# Patient Record
Sex: Male | Born: 1940 | Race: White | Hispanic: No | Marital: Married | Smoking: Current every day smoker
Health system: Southern US, Community
[De-identification: ages and names within clinical notes are randomized; demographics above are authoritative.]

## PROBLEM LIST (undated history)

## (undated) DIAGNOSIS — I671 Cerebral aneurysm, nonruptured: Secondary | ICD-10-CM

## (undated) DIAGNOSIS — K219 Gastro-esophageal reflux disease without esophagitis: Secondary | ICD-10-CM

## (undated) DIAGNOSIS — H409 Unspecified glaucoma: Secondary | ICD-10-CM

## (undated) DIAGNOSIS — G062 Extradural and subdural abscess, unspecified: Secondary | ICD-10-CM

## (undated) DIAGNOSIS — L270 Generalized skin eruption due to drugs and medicaments taken internally: Secondary | ICD-10-CM

## (undated) DIAGNOSIS — R0989 Other specified symptoms and signs involving the circulatory and respiratory systems: Secondary | ICD-10-CM

## (undated) DIAGNOSIS — R0600 Dyspnea, unspecified: Secondary | ICD-10-CM

## (undated) DIAGNOSIS — Z87442 Personal history of urinary calculi: Secondary | ICD-10-CM

## (undated) DIAGNOSIS — I639 Cerebral infarction, unspecified: Secondary | ICD-10-CM

## (undated) DIAGNOSIS — Z9889 Other specified postprocedural states: Secondary | ICD-10-CM

## (undated) DIAGNOSIS — M545 Low back pain, unspecified: Secondary | ICD-10-CM

## (undated) DIAGNOSIS — E7800 Pure hypercholesterolemia, unspecified: Secondary | ICD-10-CM

## (undated) DIAGNOSIS — Z9289 Personal history of other medical treatment: Secondary | ICD-10-CM

## (undated) DIAGNOSIS — Z8679 Personal history of other diseases of the circulatory system: Secondary | ICD-10-CM

## (undated) DIAGNOSIS — H269 Unspecified cataract: Secondary | ICD-10-CM

## (undated) DIAGNOSIS — R569 Unspecified convulsions: Secondary | ICD-10-CM

## (undated) DIAGNOSIS — M1990 Unspecified osteoarthritis, unspecified site: Secondary | ICD-10-CM

## (undated) DIAGNOSIS — J189 Pneumonia, unspecified organism: Secondary | ICD-10-CM

## (undated) DIAGNOSIS — J439 Emphysema, unspecified: Secondary | ICD-10-CM

## (undated) DIAGNOSIS — C4490 Unspecified malignant neoplasm of skin, unspecified: Secondary | ICD-10-CM

## (undated) DIAGNOSIS — T7840XA Allergy, unspecified, initial encounter: Secondary | ICD-10-CM

## (undated) DIAGNOSIS — G8929 Other chronic pain: Secondary | ICD-10-CM

## (undated) DIAGNOSIS — I10 Essential (primary) hypertension: Secondary | ICD-10-CM

## (undated) HISTORY — PX: FOOT FRACTURE SURGERY: SHX645

## (undated) HISTORY — DX: Unspecified malignant neoplasm of skin, unspecified: C44.90

## (undated) HISTORY — PX: BRAIN SURGERY: SHX531

## (undated) HISTORY — DX: Emphysema, unspecified: J43.9

## (undated) HISTORY — PX: COLONOSCOPY: SHX174

## (undated) HISTORY — DX: Unspecified cataract: H26.9

## (undated) HISTORY — DX: Allergy, unspecified, initial encounter: T78.40XA

## (undated) HISTORY — PX: EYE SURGERY: SHX253

## (undated) HISTORY — PX: CATARACT EXTRACTION W/ INTRAOCULAR LENS  IMPLANT, BILATERAL: SHX1307

## (undated) HISTORY — DX: Cerebral infarction, unspecified: I63.9

## (undated) HISTORY — DX: Unspecified glaucoma: H40.9

## (undated) HISTORY — PX: FRACTURE SURGERY: SHX138

---

## 2007-09-28 ENCOUNTER — Encounter: Admission: RE | Admit: 2007-09-28 | Discharge: 2007-09-28 | Payer: Self-pay | Admitting: Orthopedic Surgery

## 2007-10-13 ENCOUNTER — Inpatient Hospital Stay (HOSPITAL_COMMUNITY): Admission: RE | Admit: 2007-10-13 | Discharge: 2007-10-15 | Payer: Self-pay | Admitting: Orthopedic Surgery

## 2011-02-05 NOTE — Op Note (Signed)
NAME:  Damon, Parrish NO.:  [id number]   MEDICAL RECORD NO.:  [id number]          PATIENT TYPE:  INP   LOCATION:  2550                         FACILITY:  [facility]   PHYSICIAN:  Doralee Albino. Carola Frost, M.D. DATE OF BIRTH:  1941-05-17   DATE OF PROCEDURE:  10/13/2007  DATE OF DISCHARGE:                               OPERATIVE REPORT   PREOPERATIVE DIAGNOSIS:  Left intra-articular calcaneus fracture.   POSTOPERATIVE DIAGNOSIS:  Left intra-articular calcaneus fracture.   PROCEDURE:  Open reduction and internal fixation of left calcaneus  fracture.   SURGEON:  Doralee Albino. Carola Frost, M.D.   ASSISTANT:  Montez Morita, P.A.-C.   ANESTHESIA:  General.   ANESTHESIOLOGIST:  Quita Skye. Krista Blue, M.D.   TOTAL TOURNIQUET TIME:  125 minutes.   DRAINS:  One.   ESTIMATED BLOOD LOSS:  40 mL.   DISPOSITION:  To PACU.   CONDITION:  Stable.   BRIEF SUMMARY AND INDICATIONS FOR PROCEDURE:  Damon Parrish is a very  pleasant 70 year old male who fell off a ladder, sustaining an intra-  articular left calcaneus fracture.  This was allowed to undergo a period  of surrounding soft tissue swelling resolution until the skin wrinkled  easily.  We also discussed the importance of smoking cessation and high  risk of perioperative wound complications which could lead to infection  in smokers in this demographic.  The patient understood this clearly.  He understood all of the risks and benefits associated surgery including  the possibility of arthritis, decreased range of motion, infection,  neurovascular injury, need for further surgery including amputation,  DVT, PE, heart attack, stroke and others.  After full discussion, he  wished to proceed.   DESCRIPTION OF PROCEDURE:  [name] was taken to the operating room  after administration of preop antibiotics.  General anesthesia was  induced.  His left lower extremity was prepped and draped in the usual  sterile fashion with a tourniquet about the  thigh.  The incision was  marked.  The Esmarch bandage was used to exsanguinate the extremity and  tourniquet inflated to 300 mmHg.  We then made a full-thickness incision  and elevated the flap, being careful with dissection proximally to avoid  injury to the sural nerve and also to the peroneal tendons.  A  meticulous technique was used to avoid pressure and manipulation of the  flap.  It was retracted with K-wires placed into the fibula and talus.  We then removed the lateral wall which had extensive blowout with an  osteotome.  We were able to identify the lateral piece of the  subchondral subtalar facet.  This was derotated, and then we began work  on restoring the largest portion of the substance of the subtalar facet.  This had to be manipulated and then elevated with the laminar spreaders  in order to oppose it properly to the sustentaculum.  We then placed the  lateral aspect of the subtalar joint, subchondral bone and at drove a K-  wire across to hold it provisionally.  We then placed a K-wire into the  tuberosity,  pulling retraction and valgus force to properly orient the  tuberosity with regard to restoration of height, length, and hind foot  valgus.  This was pinned provisionally with two 6.2 K-wire pins through  the tuberosity into the talus.  After copious irrigation, we then  obtained multiple fluoro images showing appropriate reduction.  We  replaced the lateral wall in two segments and then the DePuy titanium  plate.  We placed multiple screws, again, correcting the hind foot  alignment and making sure we were back in appropriate reduction.  We  then also were able to distract the ankle and subtalar joint and feel  with the Glorious Peach and see that there was no intra-articular penetration of  the sustentacular lag screw.  This lag screw had been placed outside the  plate over the near cortex with 3.5 drill.  The wound was copiously  irrigated and closed in standard layered  fashion using 2-0 Vicryl, 3-0  Vicryl, and 3-0 nylon with Allgower-Donati and simple sutures.  A deep  drain was placed prior to closure.  A sterile gently compressive  dressing was applied and then a night splint.   The patient was awakened from anesthesia and transported to the PACU in  stable condition.  The tourniquet was deflated at 2 hours and 5 minutes  which was before  final closure of the epidermal layer with the nylons.   PROGNOSIS:  [name] has had a severe injury to his left calcaneus but  reconstruction was able to eliminate the lateral wall blowout, the hind  foot varus, and restore height and congruity of the subtalar joint.  As  such, he has a good chance at significant improvement in his ambulatory  function.  He is still at elevated risk for wound complications  secondary to his smoking and for subtalar arthritis, given the nature of  his injury.  He will be on DVT prophylaxis with Lovenox for the next 3  weeks, as we do anticipate him to be quite active.  The drain will be  removed on postop day #2.  Anticipate graduated weightbearing beginning  at 8 weeks.      Doralee Albino. Carola Frost, M.D.  Electronically Signed     MHH/MEDQ  D:  10/13/2007  T:  10/13/2007  Job:  [id number]

## 2011-02-08 NOTE — Discharge Summary (Signed)
NAME:  Damon Parrish, Damon Parrish NO.:  [id number]   MEDICAL RECORD NO.:  [id number]          PATIENT TYPE:  INP   LOCATION:  5030                         FACILITY:  [facility]   PHYSICIAN:  Doralee Albino. Carola Frost, M.D. DATE OF BIRTH:  1941/08/03   DATE OF ADMISSION:  10/13/2007  DATE OF DISCHARGE:  10/15/2007                               DISCHARGE SUMMARY   DISCHARGE DIAGNOSIS:  Left intra articular calcaneus fracture.   Additional discharge diagnoses:  1. GERD  2. Hypercholesterolemia.  3. Hypertension.  4. Coronary artery disease.   PROCEDURES PERFORMED:  Open reduction and internal fixation of left  calcaneus fracture.   BRIEF HISTORY AND HOSPITAL COURSE:  Damon Parrish is a 70 year old Caucasian  male who sustained an  injury on September 26, 2007 when he was on a ladder  doing some yard work and subsequently fell off, resulting in an intra  articular fracture of his left calcaneus. Secondary to substantial soft  tissue swelling immediate operative intervention was unable to be  performed, therefore we allowed ample time for resolution of soft tissue  swelling and edema and allowed enough mobility of the skin to return  prior to taking  the patient to the operating room for definitive  fixation of this fracture. On 10/13/07  the patient was brought to the  operating room  for open reduction and internal fixation of his left  calcaneus fracture.  The patient tolerated the procedure well and did  not have any intra operative complications.   The patient's hospital course was relatively uncomplicated. On  postoperative day number one,  the patient was doing extremely well with  fairly well controlled pain and maxed out at approximately 5/10 despite  his block wearing off earlier that morning. He, at no point complained  of any numbness or tingling  in his left lower extremity. At no point  did he experience any nausea, vomiting, chest pain or shortness of  breath or palpitations. On  postoperative day number two,  the patient  continued to do exceptionally well with extremely well controlled pain  on oral medications. He did not have any new complaints at that time. He  was also tolerating solids very well. He was passing gas and voiding  without problem and stated his desire to return home.  The patient's  drain was then pulled and his dressing was changed. His hemoglobin and  hematocrit was stable, along with stable vital signs, therefore the  patient was deemed appropriate for discharge to home. In addition  the  patient was working exceptionally well with physical therapy and making  tremendous progress in such a short period of time.   DISCHARGE PHYSICAL EXAM:  Vital Signs: Temperature 98.4, heart rate 98,  respirations 18, blood pressure 111/68, O2 sat 96% on room air. CBC-  white blood cells 9.4, hemoglobin 11.6, hematocrit 34.4, platelets 291,  sodium 131, potassium 3.8, chloride 95, C02 30, BUN 7, creatinine 0.80,  glucose 111, calcium 9.1.  GENERAL:  The patient is awake, alert and no acute distress, continues  to be extremely pleasant and is  sitting upright in bed.  LUNGS: Decreased throughout but are clear.  CARDIAC: Regular rate and rhythm.  EXTREMITIES: Left lower extremity incision is clean, dry and intact. No  drainage. The drain was intact and was removed. Sensation along the DPN,  SPN, TN is intact to light touch. EHL and FHL is intact. Anterior  tibialis and posterior tibialis is intact. There is no deep calf  tenderness, 2+ dorsalis pedis pulse is noted. Skin is appropriate color  and temperature.   DISCHARGE INSTRUCTIONS:  Damon Parrish will continue to be non weight  bearing on his left lower extremity and we will continue to maintain him  in a night splint to put his foot in a neutral position in hopes to  prevent any development of an equinus contracture and to provide some  stability for wound healing. We anticipate that Damon Parrish may be on  some  graduated weight bearing around the 8 week mark. Damon Parrish does have  extensive smoking history of approximately 1-2 packs over the last 50  years which increases his risk even further for  a development of non  union, malunion and/or wound complication. Again, this has been  reemphasized to Damon Parrish and he acknowledges the potential  complications of continued smoking. Given the lower extremity fracture  and history of smoking, Damon Parrish will be on Lovenox for DVT prophylaxis  and we anticipate this for the next 21 days or so, given the fact we do  anticipate Damon Parrish being quite active, despite being non weight  bearing on his left lower extremity. We will see Damon Parrish back in 10-14  days for a followup visit in the office at which time we anticipate  removal of sutures and we will obtain x-rays to assess hardware  placement and reduction of his fracture. The patient may then at that  time be allowed to come out of his night splint periodically for range  of motion activities.   DISCHARGE MEDICATIONS:  1. Percocet 5/325, one to two p.o. q 4-6h as needed for pain.  2. Oxycodone 5 mg, one to two p.o q 3h in between Percocet doses for      breakthrough pain.  3. Robaxin 500 mg, one to two p.o. q 6h as needed for muscle spasm.   The patient will continue home medications as follows:  1. Hydrochlorothiazide 25 mg, p.o. daily.  2. Lexapro 10 mg p.o. daily.  3. Protonix 40 mg p.o. daily.  4. He may resume aspirin 81 mg p.o. daily.  5. Lipitor 40 mg p.o.daily.      Damon Latin, PA      Doralee Albino. Carola Frost, M.D.  Electronically Signed    KWP/MEDQ  D:  11/11/2007  T:  11/12/2007  Job:  [id number]

## 2011-06-13 LAB — BASIC METABOLIC PANEL
BUN: 13
BUN: 7
BUN: 8
CO2: 29
Calcium: 8.4
Chloride: 95 — ABNORMAL LOW
Creatinine, Ser: 0.97
Creatinine, Ser: 0.97
GFR calc non Af Amer: >60
GFR calc non Af Amer: >60
Glucose, Bld: 116 — ABNORMAL HIGH
Potassium: 3.8
Sodium: 131 — ABNORMAL LOW

## 2011-06-13 LAB — DIFFERENTIAL
Basophils Absolute: 0.1
Basophils Relative: 1
Eosinophils Absolute: 0.3
Eosinophils Relative: 3

## 2011-06-13 LAB — CBC
HCT: 41.9
Hemoglobin: 11.6 — ABNORMAL LOW
Hemoglobin: 12.1 — ABNORMAL LOW
MCHC: 34.3
MCV: 98.9
Platelets: 291
Platelets: 409 — ABNORMAL HIGH
RDW: 12.2
RDW: 12.4
RDW: 12.4

## 2011-06-13 LAB — PROTIME-INR: Prothrombin Time: 15

## 2014-09-23 DIAGNOSIS — Z8679 Personal history of other diseases of the circulatory system: Secondary | ICD-10-CM

## 2014-09-23 HISTORY — DX: Personal history of other diseases of the circulatory system: Z86.79

## 2014-12-21 ENCOUNTER — Emergency Department (HOSPITAL_COMMUNITY)
Admission: EM | Admit: 2014-12-21 | Discharge: 2014-12-21 | Disposition: A | Discharge status: Home / Self Care | Payer: Medicare Other | Attending: Emergency Medicine | Admitting: Emergency Medicine

## 2014-12-21 ENCOUNTER — Encounter (HOSPITAL_COMMUNITY): Payer: Self-pay | Admitting: Emergency Medicine

## 2014-12-21 DIAGNOSIS — Z7951 Long term (current) use of inhaled steroids: Secondary | ICD-10-CM | POA: Diagnosis not present

## 2014-12-21 DIAGNOSIS — K120 Recurrent oral aphthae: Secondary | ICD-10-CM | POA: Diagnosis not present

## 2014-12-21 DIAGNOSIS — I10 Essential (primary) hypertension: Secondary | ICD-10-CM | POA: Diagnosis not present

## 2014-12-21 DIAGNOSIS — Z720 Tobacco use: Secondary | ICD-10-CM | POA: Diagnosis not present

## 2014-12-21 DIAGNOSIS — Z79899 Other long term (current) drug therapy: Secondary | ICD-10-CM | POA: Diagnosis not present

## 2014-12-21 DIAGNOSIS — K1379 Other lesions of oral mucosa: Secondary | ICD-10-CM | POA: Diagnosis present

## 2014-12-21 HISTORY — DX: Essential (primary) hypertension: I10

## 2014-12-21 MED ORDER — MAGIC MOUTHWASH
ORAL | Status: AC
  Filled 2014-12-21: qty 5

## 2014-12-21 MED ORDER — MAGIC MOUTHWASH
5.0000 mL | Freq: Three times a day (TID) | ORAL | Status: DC | PRN
  Administered 2014-12-21: 5 mL via ORAL

## 2014-12-21 MED ORDER — MAGIC MOUTHWASH: 5.0000 mL | Freq: Three times a day (TID) | ORAL | Status: DC | PRN

## 2014-12-21 NOTE — ED Provider Notes (Signed)
CSN: [id number]     Arrival date & time 12/21/14  2006 History   First MD Initiated Contact with Patient 12/21/14 2013     Chief Complaint  Patient presents with  . Mouth Lesions     (Consider location/radiation/quality/duration/timing/severity/associated sxs/prior Treatment) HPI  This is a 74 year old male with a history of hypertension and COPD who presents with mouth lesions. Patient reports that he was recently placed on a steroid inhaler by his primary care physician. He has since followed up with a pulmonologist and was placed on Anoro Ellipta on Thursday of last week. On Saturday he noted that he was developing lesions in his oral cavity. They were painful. He called his primary care physician who prescribed him nystatin. That has not helped at all.  Rates his pain is now is 9 out of 10. Denies any difficulty swallowing. States that he has been hoarse since he had bronchitis at the end of February and that the pulmonologist told him it was related to oral steroid use.  Denies rash elsewhere.  Past Medical History  Diagnosis Date  . Hypertension    Past Surgical History  Procedure Laterality Date  . Foot surgery      Left   No family history on file. History  Substance Use Topics  . Smoking status: Current Every Day Smoker -- 0.50 packs/day    Types: Cigarettes  . Smokeless tobacco: Not on file  . Alcohol Use: No    Review of Systems  Constitutional: Negative.  Negative for fever.  HENT: Positive for mouth sores.   Respiratory: Negative.  Negative for chest tightness and shortness of breath.   Cardiovascular: Negative.  Negative for chest pain.  Gastrointestinal: Negative.  Negative for abdominal pain.  Genitourinary: Negative.  Negative for dysuria.  Skin: Negative for rash.  All other systems reviewed and are negative.     Allergies  Review of patient's allergies indicates no known allergies.  Home Medications   Prior to Admission medications   Medication  Sig Start Date End Date Taking? Authorizing Provider  ANORO ELLIPTA 62.5-25 MCG/INH AEPB Inhale 1 puff into the lungs daily.  12/15/14  Yes Historical Provider, MD  atorvastatin (LIPITOR) 40 MG tablet Take 40 mg by mouth every other day.   Yes Historical Provider, MD  brimonidine-timolol (COMBIGAN) 0.2-0.5 % ophthalmic solution Place 1 drop into both eyes every 12 (twelve) hours.   Yes Historical Provider, MD  cholecalciferol (VITAMIN D) 1000 UNITS tablet Take 1,000 Units by mouth daily.   Yes Historical Provider, MD  fluticasone (FLONASE) 50 MCG/ACT nasal spray Place 2 sprays into both nostrils daily.   Yes Historical Provider, MD  hydrochlorothiazide (HYDRODIURIL) 25 MG tablet Take 25 mg by mouth daily. 10/29/14  Yes Historical Provider, MD  lansoprazole (PREVACID) 15 MG capsule Take 15 mg by mouth daily at 12 noon.   Yes Historical Provider, MD  Multiple Vitamin (MULTIVITAMIN WITH MINERALS) TABS tablet Take 1 tablet by mouth daily.   Yes Historical Provider, MD  TRAVATAN Z 0.004 % SOLN ophthalmic solution Place 1 drop into both eyes at bedtime.  11/26/14  Yes Historical Provider, MD  vitamin C (ASCORBIC ACID) 500 MG tablet Take 500 mg by mouth daily.   Yes Historical Provider, MD  vitamin E 400 UNIT capsule Take 400 Units by mouth daily.   Yes Historical Provider, MD  Alum & Mag Hydroxide-Simeth (MAGIC MOUTHWASH) SOLN Take 5 mLs by mouth 3 (three) times daily as needed for mouth pain (Swish and spit).  12/21/14   Merryl Hacker, MD   BP 146/85 mmHg  Pulse 81  Temp(Src) 98.4 F (36.9 C) (Oral)  Resp 18  Ht 5\' 8"  (1.727 m)  Wt 164 lb (74.39 kg)  BMI 24.94 kg/m2  SpO2 100% Physical Exam  Constitutional: He is oriented to person, place, and time. He appears well-developed and well-nourished. No distress.  HENT:  Head: Normocephalic and atraumatic.  Mucous membranes moist, multiple superficial ulcerations noted just within the oral cavity as well as to ulcerations noted of the hard palate, base  clear, no drainage or bleeding noted, no blistering noted, no trismus, no oral pharyngeal swelling noted  Eyes: Pupils are equal, round, and reactive to light.  Neck: Neck supple.  Cardiovascular: Normal rate and regular rhythm.   No murmur heard. Pulmonary/Chest: Effort normal. No respiratory distress.  Musculoskeletal: He exhibits no edema.  Neurological: He is alert and oriented to person, place, and time.  Skin: Skin is warm and dry.  Psychiatric: He has a normal mood and affect.  Nursing note and vitals reviewed.   ED Course  Procedures (including critical care time) Labs Review Labs Reviewed - No data to display  Imaging Review No results found.   EKG Interpretation None      MDM   Final diagnoses:  Aphthous ulcer    Patient presents with ulcerations in the mouth. Has been treated for fungal infection but was not evaluated by his primary doctor and nystatin is not working. Physical exam is more consistent with aphthous ulcers versus herpangina.  It is unclear if this is related to his recent medication changes. He has started a new medication which I'm unfamiliar with. I cannot find any documentation of this sort of side effect with that medication. Have urged the patient to contact his pulmonologist to advise them of his symptoms. Patient is to stop nystatin. He will be given Magic mouthwash and can swish and spit 3 times daily as needed for pain. No evidence at this time of acute allergic reaction or airway compromise. Patient and his wife were advised of return precautions regarding worsening of his symptoms. Patient and his wife stated understanding.  Merryl Hacker, MD 12/21/14 [id number]

## 2014-12-21 NOTE — Discharge Instructions (Signed)
Oral Ulcers Oral ulcers are painful, shallow sores around the lining of the mouth. They can affect the gums, the inside of the lips, and the cheeks. (Sores on the outside of the lips and on the face are different.) They typically first occur in school-aged children and teenagers. Oral ulcers may also be called canker sores or cold sores. CAUSES  Canker sores and cold sores can be caused by many factors including:  Infection.  Injury.  Sun exposure.  Medications.  Emotional stress.  Food allergies.  Vitamin deficiencies.  Toothpastes containing sodium lauryl sulfate. The herpes virus can be the cause of mouth ulcers. The first infection can be severe and cause 10 or more ulcers on the gums, tongue, and lips with fever and difficulty in swallowing. This infection usually occurs between the ages of 1 and 3 years.  SYMPTOMS  The typical sore is about  inch (6 mm) in size and is an oval or round ulcer with red borders. DIAGNOSIS  Your caregiver can diagnose simple oral ulcers by examination. Additional testing is usually not required.  TREATMENT  Treatment is aimed at pain relief. Generally, oral ulcers resolve by themselves within 1 to 2 weeks without medication and are not contagious unless caused by herpes (and other viruses). Antibiotics are not effective with mouth sores. Avoid direct contact with others until the ulcer is completely healed. See your caregiver for follow-up care as recommended. Also:  Offer a soft diet.  Encourage plenty of fluids to prevent dehydration. Popsicles and milk shakes can be helpful.  Avoid acidic and salty foods and drinks such as orange juice.  Infants and young children will often refuse to drink because of pain. Using a teaspoon, cup, or syringe to give small amounts of fluids frequently can help prevent dehydration.  Cold compresses on the face may help reduce pain.  Pain medication can help control soreness.  A solution of diphenhydramine  mixed with a liquid antacid can be useful to decrease the soreness of ulcers. Consult a caregiver for the dosing.  Liquids or ointments with a numbing ingredient may be helpful when used as recommended.  Older children and teenagers can rinse their mouth with a salt-water mixture (1/2 teaspoon of salt in 8 ounces of water) four times a day. This treatment is uncomfortable but may reduce the time the ulcers are present.  There are many over-the-counter throat lozenges and medications available for oral ulcers. Their effectiveness has not been studied.  Consult your medical caregiver prior to using homeopathic treatments for oral ulcers. SEEK MEDICAL CARE IF:   You think your child needs to be seen.  The pain worsens and you cannot control it.  There are 4 or more ulcers.  The lips and gums begin to bleed and crust.  A single mouth ulcer is near a tooth that is causing a toothache or pain.  Your child has a fever, swollen face, or swollen glands.  The ulcers began after starting a medication.  Mouth ulcers keep reoccurring or last more than 2 weeks.  You think your child is not taking adequate fluids. SEEK IMMEDIATE MEDICAL CARE IF:   Your child has a high fever.  Your child is unable to swallow or becomes dehydrated.  Your child looks or acts very ill.  An ulcer caused by a chemical your child accidentally put in their mouth. Document Released: 10/17/2004 Document Revised: 01/24/2014 Document Reviewed: 06/01/2009 ExitCare Patient Information 2015 [employer]. This information is not intended to replace advice   given to you by your health care provider. Make sure you discuss any questions you have with your health care provider.  

## 2014-12-21 NOTE — ED Notes (Signed)
Pt was treated for bronchitis end of February, started having mouth lesions, saw PCP and got nystatin , feel like lesions are worse.

## 2014-12-22 ENCOUNTER — Telehealth: Payer: Self-pay | Admitting: *Deleted

## 2014-12-22 NOTE — Telephone Encounter (Signed)
Pharmacy states Alum & Mag Hydroxide-Simeth (MAGIC MOUTHWASH) SOLN prescribed is more for pt with GI dx; suggested to change to [facility] MOUTHWASH for mouth pain with lidocaine.

## 2015-02-09 ENCOUNTER — Other Ambulatory Visit (HOSPITAL_COMMUNITY): Payer: Self-pay | Admitting: Neurosurgery

## 2015-02-09 ENCOUNTER — Other Ambulatory Visit: Payer: Self-pay | Admitting: Neurosurgery

## 2015-02-09 DIAGNOSIS — I729 Aneurysm of unspecified site: Secondary | ICD-10-CM

## 2015-03-17 ENCOUNTER — Ambulatory Visit (HOSPITAL_COMMUNITY)
Admission: RE | Admit: 2015-03-17 | Discharge: 2015-03-17 | Disposition: A | Discharge status: Home / Self Care | Payer: Medicare Other | Source: Ambulatory Visit | Attending: Neurosurgery | Admitting: Neurosurgery

## 2015-03-17 ENCOUNTER — Other Ambulatory Visit (HOSPITAL_COMMUNITY): Payer: Self-pay | Admitting: Neurosurgery

## 2015-03-17 DIAGNOSIS — I671 Cerebral aneurysm, nonruptured: Secondary | ICD-10-CM | POA: Diagnosis not present

## 2015-03-17 DIAGNOSIS — J329 Chronic sinusitis, unspecified: Secondary | ICD-10-CM | POA: Insufficient documentation

## 2015-03-17 DIAGNOSIS — F17210 Nicotine dependence, cigarettes, uncomplicated: Secondary | ICD-10-CM | POA: Diagnosis not present

## 2015-03-17 DIAGNOSIS — I10 Essential (primary) hypertension: Secondary | ICD-10-CM | POA: Diagnosis not present

## 2015-03-17 DIAGNOSIS — Z7982 Long term (current) use of aspirin: Secondary | ICD-10-CM | POA: Insufficient documentation

## 2015-03-17 DIAGNOSIS — I729 Aneurysm of unspecified site: Secondary | ICD-10-CM

## 2015-03-17 LAB — BASIC METABOLIC PANEL
Anion gap: 12 (ref 5–15)
BUN: 10 mg/dL (ref 6–20)
CHLORIDE: 96 mmol/L — AB (ref 101–111)
CO2: 23 mmol/L (ref 22–32)
CREATININE: 0.91 mg/dL (ref 0.61–1.24)
Calcium: 9.4 mg/dL (ref 8.9–10.3)
GFR calc non Af Amer: >60 mL/min (ref >60)
Glucose, Bld: 116 mg/dL — ABNORMAL HIGH (ref 65–99)
POTASSIUM: 3.2 mmol/L — AB (ref 3.5–5.1)
Sodium: 131 mmol/L — ABNORMAL LOW (ref 135–145)

## 2015-03-17 LAB — APTT: aPTT: 37 seconds (ref 24–37)

## 2015-03-17 LAB — CBC WITH DIFFERENTIAL/PLATELET
Basophils Absolute: 0 10*3/uL (ref 0.0–0.1)
Basophils Relative: 1 % (ref 0–1)
EOS ABS: 0.3 10*3/uL (ref 0.0–0.7)
EOS PCT: 3 % (ref 0–5)
HCT: 42 % (ref 39.0–52.0)
HEMOGLOBIN: 14.6 g/dL (ref 13.0–17.0)
LYMPHS ABS: 2.8 10*3/uL (ref 0.7–4.0)
LYMPHS PCT: 37 % (ref 12–46)
MCH: 33.3 pg (ref 26.0–34.0)
MCHC: 34.8 g/dL (ref 30.0–36.0)
MCV: 95.7 fL (ref 78.0–100.0)
Monocytes Absolute: 0.8 10*3/uL (ref 0.1–1.0)
Monocytes Relative: 11 % (ref 3–12)
Neutro Abs: 3.6 10*3/uL (ref 1.7–7.7)
Neutrophils Relative %: 48 % (ref 43–77)
Platelets: 249 10*3/uL (ref 150–400)
RBC: 4.39 MIL/uL (ref 4.22–5.81)
RDW: 12.7 % (ref 11.5–15.5)
WBC: 7.5 10*3/uL (ref 4.0–10.5)

## 2015-03-17 LAB — PROTIME-INR
INR: 1.41 (ref 0.00–1.49)
Prothrombin Time: 17.3 seconds — ABNORMAL HIGH (ref 11.6–15.2)

## 2015-03-17 MED ORDER — MIDAZOLAM HCL 2 MG/2ML IJ SOLN
INTRAMUSCULAR | Status: AC
  Filled 2015-03-17: qty 2

## 2015-03-17 MED ORDER — SODIUM CHLORIDE 0.9 % IV SOLN
INTRAVENOUS | Status: AC | PRN
  Administered 2015-03-17: 10 mL/h via INTRAVENOUS

## 2015-03-17 MED ORDER — HEPARIN SODIUM (PORCINE) 1000 UNIT/ML IJ SOLN
INTRAMUSCULAR | Status: AC | PRN
  Administered 2015-03-17: 2000 [IU] via INTRAVENOUS

## 2015-03-17 MED ORDER — FENTANYL CITRATE (PF) 100 MCG/2ML IJ SOLN
INTRAMUSCULAR | Status: AC
  Filled 2015-03-17: qty 2

## 2015-03-17 MED ORDER — MIDAZOLAM HCL 2 MG/2ML IJ SOLN
INTRAMUSCULAR | Status: AC | PRN
  Administered 2015-03-17: 0.5 mg via INTRAVENOUS

## 2015-03-17 MED ORDER — HYDROCODONE-ACETAMINOPHEN 5-325 MG PO TABS: 1.0000 | ORAL_TABLET | ORAL | Status: DC | PRN

## 2015-03-17 MED ORDER — IOHEXOL 300 MG/ML  SOLN
250.0000 mL | Freq: Once | INTRAMUSCULAR | Status: AC | PRN
  Administered 2015-03-17: 100 mL via INTRA_ARTERIAL

## 2015-03-17 MED ORDER — LIDOCAINE HCL 1 % IJ SOLN
INTRAMUSCULAR | Status: AC
  Filled 2015-03-17: qty 20

## 2015-03-17 MED ORDER — FENTANYL CITRATE (PF) 100 MCG/2ML IJ SOLN
INTRAMUSCULAR | Status: AC | PRN
  Administered 2015-03-17: 25 ug via INTRAVENOUS

## 2015-03-17 MED ORDER — SODIUM CHLORIDE 0.9 % IV SOLN: INTRAVENOUS | Status: DC

## 2015-03-17 MED ORDER — HEPARIN SOD (PORK) LOCK FLUSH 100 UNIT/ML IV SOLN
INTRAVENOUS | Status: AC
  Filled 2015-03-17: qty 20

## 2015-03-17 NOTE — Discharge Instructions (Signed)
Angiogram °An angiogram, also called angiography, is a procedure used to look at the blood vessels that carry blood to different parts of your body (arteries). In this procedure, dye is injected through a long, thin tube (catheter) into an artery. X-rays are then taken. The X-rays will show if there is a blockage or problem in a blood vessel.  °LET YOUR HEALTH CARE PROVIDER KNOW ABOUT: °· Any allergies you have, including allergies to shellfish or contrast dye.   °· All medicines you are taking, including vitamins, herbs, eye drops, creams, and over-the-counter medicines.   °· Previous problems you or members of your family have had with the use of anesthetics.   °· Any blood disorders you have.   °· Previous surgeries you have had. °· Any previous kidney problems or failure you have had.  °· Medical conditions you have.   °· Possibility of pregnancy, if this applies. °RISKS AND COMPLICATIONS °Generally, an angiogram is a safe procedure. However, as with any procedure, problems can occur. Possible problems include: °· Injury to the blood vessels, including rupture or bleeding. °· Infection or bruising at the catheter site. °· Allergic reaction to the dye or contrast used. °· Kidney damage from the dye or contrast used. °· Blood clots that can lead to a stroke or heart attack. °BEFORE THE PROCEDURE °· Do not eat or drink after midnight on the night before the procedure, or as directed by your health care provider.   °· Ask your health care provider if you may drink enough water to take any needed medicines the morning of the procedure.   °PROCEDURE °· You may be given a medicine to help you relax (sedative) before and during the procedure. This medicine is given through an IV access tube that is inserted into one of your veins.   °· The area where the catheter will be inserted will be washed and shaved. This is usually done in the groin but may be done in the fold of your arm (near your elbow) or in the wrist. °· A  medicine will be given to numb the area where the catheter will be inserted (local anesthetic). °· The catheter will be inserted with a guide wire into an artery. The catheter is guided by using a type of X-ray (fluoroscopy) to the blood vessel being examined.   °· Dye is then injected into the catheter, and X-rays are taken. The dye helps to show where any narrowing or blockages are located.   °AFTER THE PROCEDURE  °· If the procedure is done through the leg, you will be kept in bed lying flat for several hours. You will be instructed to not bend or cross your legs. °· The insertion site will be checked frequently. °· The pulse in your feet or wrist will be checked frequently. °· Additional blood tests, X-rays, and electrocardiography may be done.   °· You may need to stay in the hospital overnight for observation.   °Document Released: 06/19/2005 Document Revised: 09/14/2013 Document Reviewed: 02/10/2013 °ExitCare® Patient Information ©2015 [employer]. This information is not intended to replace advice given to you by your health care provider. Make sure you discuss any questions you have with your health care provider. ° °

## 2015-03-17 NOTE — Op Note (Signed)
DIAGNOSTIC CEREBRAL ANGIOGRAM    OPERATOR:   Dr. Consuella Lose, MD  HISTORY:   The patient is a 74 y.o. yo male with previous incidentally discovered RICA aneurysm. He presents today for further workup with diagnostic cerebral angiogram.  APPROACH:   The technical aspects of the procedure as well as its potential risks and benefits were reviewed with the patient. These risks included but were not limited bleeding, infection, allergic reaction, damage to organs/vital structures, stroke, non-diagnostic procedure, and the catastrophic outcomes of heart attack, coma, and death. With an understanding of these risks, informed consent was obtained and witnessed.    The patient was placed in the supine position on the angiography table and the skin of right groin prepped in the usual sterile fashion. The procedure was performed under local anesthesia (1%-solution of bicarbonate-bufferred Lidoacaine) and conscious sedation with Versed and fentanyl monitored by the in-suite nurse.    A 5- French sheath was introduced in the right common femoral artery using Seldinger technique.  A fluorophase sequence was used to document the sheath position.    HEPARIN: 2000 Units total.   CONTRAST AGENT: 130cc, Omnipaque 300   FLUOROSCOPY TIME: 13.8 combined AP and lateral minutes    CATHETER(S) AND WIRE(S):    5-French JB-1 glidecatheter  5-French Cook vert catheter 0.035 Roadrunner  0.035" glidewire    VESSELS CATHETERIZED:   Right common carotid   Right internal carotid Left common carotid   Right vertebral   Right common femoral  VESSELS STUDIED:   Aortic arch Right common carotid, neck Right internal carotid, head Right internal carotid, 3D rotation Right vertebral Left common carotid, neck Left common carotid, head Right femoral  PROCEDURAL NARRATIVE:   A 5-Fr JB-1 terumo glide catheter was advanced over a 0.035 glidewire into the aortic arch. The above vessels were then sequentially  catheterized and cervical/cerebral angiograms taken. After review of images, the catheter was removed without incident.    INTERPRETATION:   Aortic arch:    Type III, normal three vessel arch configuration. No significant ostial stenosis.   Right common carotid: neck:   There is no significant stenosis, occlusion, aneurysm or plaque visualized on this injection.    Right internal carotid: head:   Injection reveals the presence of a widely patent ICA, M1, and A1 segments and their branches. 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  This appears to measure approximately 6.8 x 5.35mm. Aneurysm morphology and relationship to the ICA is better defined on the 3D angiogram. The parenchymal and venous phases are normal. The venous sinuses are widely patent.    Right internal carotid, 3-D rotation: 3-dimensional rotational angiographic images were reconstructed on an independent workstation for review. 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 It appears to have a relatively narrow neck, however the anterior choroidal artery appears to arise from the proximal portion of the aneurysm.  Left common carotid: neck:   There is no significant stenosis, occlusion, aneurysm or plaque visualized on this injection.    Left common carotid: head:   Injection reveals the presence of a widely patent ICA, A1, and M1 segments and their branches. There is no significant stenosis, occlusion, aneurysm, or high flow vascular malformation visualized. The parenchymal and venous phases are normal. The venous sinuses are widely patent.    Right vertebral:   Injection reveals the presence of a widely patent vertebral  artery. This leads to a widely patent basilar artery that terminates in bilateral P1. The basilar apex is normal. There is good  contrast reflux down the contralateral left vertebral artery, with visualization of the left PICA origin. No aneurysm is seen in this location. There is no significant stenosis, occlusion, aneurysm, or vascular malformation visualized. The parenchymal and venous phases are normal. The venous sinuses are widely patent.    Right femoral:    Normal vessel. No significant atherosclerotic disease. Arterial sheath in adequate position.   DISPOSITION:  Upon completion of the study, the femoral sheath was removed and hemostasis obtained using a 5-Fr ExoSeal closure device. Good proximal and distal lower extremity pulses were documented upon achievement of hemostasis.    The procedure was well tolerated and no early complications were observed.       The patient was transferred back to the holding area to be positioned flat in bed for 3 hours of observation.    IMPRESSION:  1. 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

## 2015-03-17 NOTE — H&P (Signed)
CC:  Aneurysm  HPI: Damon Parrish is a 74 y.o. male who initially underwent CT scan for chronic sinusitis. 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 This aneurysm was initially seen on MRI done in 2012. He presents today for further w/u with diagnostic angiogram.  PMH: Past Medical History  Diagnosis Date  . Hypertension     PSH: Past Surgical History  Procedure Laterality Date  . Foot surgery      Left    SH: History  Substance Use Topics  . Smoking status: Current Every Day Smoker -- 0.50 packs/day    Types: Cigarettes  . Smokeless tobacco: Not on file  . Alcohol Use: No    MEDS: Prior to Admission medications   Medication Sig Start Date End Date Taking? Authorizing Provider  Ascorbic Acid (VITAMIN C) 1000 MG tablet Take 1,000 mg by mouth daily.   Yes Historical Provider, MD  aspirin EC 81 MG tablet Take 81 mg by mouth at bedtime.   Yes Historical Provider, MD  atorvastatin (LIPITOR) 40 MG tablet Take 40 mg by mouth every other day. At bedtime   Yes Historical Provider, MD  brimonidine-timolol (COMBIGAN) 0.2-0.5 % ophthalmic solution Place 1 drop into both eyes at bedtime. Between 10pm and 11pm   Yes Historical Provider, MD  Brinzolamide-Brimonidine [facility]) 1-0.2 % SUSP Place 1 drop into both eyes 2 (two) times daily. Morning and 8pm   Yes Historical Provider, MD  cholecalciferol (VITAMIN D) 1000 UNITS tablet Take 1,000 Units by mouth daily.   Yes Historical Provider, MD  hydrochlorothiazide (HYDRODIURIL) 25 MG tablet Take 25 mg by mouth daily. 10/29/14  Yes Historical Provider, MD  lansoprazole (PREVACID) 15 MG capsule Take 15 mg by mouth daily.    Yes Historical Provider, MD  Multiple Vitamin (MULTIVITAMIN WITH MINERALS) TABS tablet Take 1 tablet by mouth daily. Centrum Silver   Yes Historical Provider, MD  Omega-3 Fatty Acids (FISH OIL) 1200 MG CAPS Take 1,200 mg by mouth daily.   Yes Historical Provider, MD  vitamin E 400 UNIT capsule Take 400 Units by  mouth daily.   Yes Historical Provider, MD    ALLERGY: No Known Allergies  ROS: ROS  NEUROLOGIC EXAM: Awake, alert, oriented Memory and concentration grossly intact Speech fluent, appropriate CN grossly intact Motor exam: Upper Extremities Deltoid Bicep Tricep Grip  Right 5/5 5/5 5/5 5/5  Left 5/5 5/5 5/5 5/5   Lower Extremity IP Quad PF DF EHL  Right 5/5 5/5 5/5 5/5 5/5  Left 5/5 5/5 5/5 5/5 5/5   Sensation grossly intact to LT  IMPRESSION: - 74 y.o. male with enlargement of RICA aneurysm, current smoker.  PLAN: - Proceed with diagnostic angiogram. - Likely home post-procedure  I have reviewed the indication for the procedure, its risks and benefits with the patient in the office. All questions were answered, and he provided consent to proceed.

## 2015-03-30 ENCOUNTER — Other Ambulatory Visit: Payer: Self-pay | Admitting: Neurosurgery

## 2015-03-30 ENCOUNTER — Other Ambulatory Visit (HOSPITAL_COMMUNITY): Payer: Self-pay | Admitting: Neurosurgery

## 2015-03-30 DIAGNOSIS — I671 Cerebral aneurysm, nonruptured: Secondary | ICD-10-CM

## 2015-04-20 ENCOUNTER — Other Ambulatory Visit: Payer: Self-pay | Admitting: Neurosurgery

## 2015-04-20 ENCOUNTER — Ambulatory Visit (HOSPITAL_COMMUNITY)
Admission: RE | Admit: 2015-04-20 | Discharge: 2015-04-20 | Disposition: A | Discharge status: Home / Self Care | Payer: Medicare Other | Source: Ambulatory Visit | Attending: Neurosurgery | Admitting: Neurosurgery

## 2015-04-20 DIAGNOSIS — F17210 Nicotine dependence, cigarettes, uncomplicated: Secondary | ICD-10-CM | POA: Insufficient documentation

## 2015-04-20 DIAGNOSIS — I10 Essential (primary) hypertension: Secondary | ICD-10-CM | POA: Insufficient documentation

## 2015-04-20 DIAGNOSIS — K219 Gastro-esophageal reflux disease without esophagitis: Secondary | ICD-10-CM | POA: Insufficient documentation

## 2015-04-20 DIAGNOSIS — I739 Peripheral vascular disease, unspecified: Secondary | ICD-10-CM | POA: Insufficient documentation

## 2015-04-20 DIAGNOSIS — Z79899 Other long term (current) drug therapy: Secondary | ICD-10-CM | POA: Insufficient documentation

## 2015-04-20 DIAGNOSIS — I671 Cerebral aneurysm, nonruptured: Secondary | ICD-10-CM

## 2015-05-03 ENCOUNTER — Encounter (HOSPITAL_COMMUNITY): Payer: Self-pay | Admitting: *Deleted

## 2015-05-03 MED ORDER — CEFAZOLIN SODIUM-DEXTROSE 2-3 GM-% IV SOLR
2.0000 g | INTRAVENOUS | Status: AC
  Administered 2015-05-04: 2 g via INTRAVENOUS
  Filled 2015-05-03 (×2): qty 50

## 2015-05-03 NOTE — Progress Notes (Signed)
Pt denies SOB, chest pain, and being under the care of a cardiologist. Pt stated that he had a stress test 20 years ago but denies having an echo and cardiac cath. Pt stated that his PCP completed an EKG within the last year; records requested from  Dr. Morrison Old of [facility] in [place], [place]. Spoke with Manuela Schwartz, CMA at Dr. Cleotilde Neer regarding pt ASA and Plavix instructions on DOS. According to Manuela Schwartz, pt is to take both Aspirin and Plavix the morning of procedure. Pt made aware to stop vitamins, Herbal medications ( vitamin E) and NSAID's. Pt verbalized understanding of all pre-op instructions.

## 2015-05-04 ENCOUNTER — Inpatient Hospital Stay (HOSPITAL_COMMUNITY): Payer: Medicare Other | Admitting: Certified Registered"

## 2015-05-04 ENCOUNTER — Encounter (HOSPITAL_COMMUNITY)
Admission: RE | Disposition: A | Discharge status: Home / Self Care | Payer: Self-pay | Source: Ambulatory Visit | Attending: Neurosurgery

## 2015-05-04 ENCOUNTER — Encounter (HOSPITAL_COMMUNITY): Payer: Self-pay | Admitting: Anesthesiology

## 2015-05-04 ENCOUNTER — Ambulatory Visit (HOSPITAL_COMMUNITY)
Admission: RE | Admit: 2015-05-04 | Discharge: 2015-05-04 | Disposition: A | Discharge status: Home / Self Care | Payer: Medicare Other | Source: Ambulatory Visit | Attending: Neurosurgery | Admitting: Neurosurgery

## 2015-05-04 ENCOUNTER — Inpatient Hospital Stay (HOSPITAL_COMMUNITY)
Admission: RE | Admit: 2015-05-04 | Discharge: 2015-05-05 | DRG: 027 | Disposition: A | Discharge status: Home / Self Care | Payer: Medicare Other | Source: Ambulatory Visit | Attending: Neurosurgery | Admitting: Neurosurgery

## 2015-05-04 DIAGNOSIS — K219 Gastro-esophageal reflux disease without esophagitis: Secondary | ICD-10-CM | POA: Diagnosis present

## 2015-05-04 DIAGNOSIS — I671 Cerebral aneurysm, nonruptured: Principal | ICD-10-CM | POA: Diagnosis present

## 2015-05-04 DIAGNOSIS — F17210 Nicotine dependence, cigarettes, uncomplicated: Secondary | ICD-10-CM | POA: Diagnosis present

## 2015-05-04 DIAGNOSIS — I10 Essential (primary) hypertension: Secondary | ICD-10-CM | POA: Diagnosis present

## 2015-05-04 DIAGNOSIS — Z87442 Personal history of urinary calculi: Secondary | ICD-10-CM | POA: Diagnosis not present

## 2015-05-04 DIAGNOSIS — Z7902 Long term (current) use of antithrombotics/antiplatelets: Secondary | ICD-10-CM | POA: Diagnosis not present

## 2015-05-04 DIAGNOSIS — Z79899 Other long term (current) drug therapy: Secondary | ICD-10-CM | POA: Diagnosis not present

## 2015-05-04 DIAGNOSIS — Z7982 Long term (current) use of aspirin: Secondary | ICD-10-CM

## 2015-05-04 HISTORY — PX: RADIOLOGY WITH ANESTHESIA: SHX6223

## 2015-05-04 HISTORY — DX: Gastro-esophageal reflux disease without esophagitis: K21.9

## 2015-05-04 HISTORY — DX: Cerebral aneurysm, nonruptured: I67.1

## 2015-05-04 LAB — URINE MICROSCOPIC-ADD ON

## 2015-05-04 LAB — URINALYSIS, ROUTINE W REFLEX MICROSCOPIC
Bilirubin Urine: NEGATIVE
GLUCOSE, UA: NEGATIVE mg/dL
HGB URINE DIPSTICK: NEGATIVE
Ketones, ur: NEGATIVE mg/dL
Nitrite: NEGATIVE
Protein, ur: NEGATIVE mg/dL
SPECIFIC GRAVITY, URINE: 1.018 (ref 1.005–1.030)
UROBILINOGEN UA: 0.2 mg/dL (ref 0.0–1.0)
pH: 6.5 (ref 5.0–8.0)

## 2015-05-04 LAB — BASIC METABOLIC PANEL
ANION GAP: 12 (ref 5–15)
BUN: 14 mg/dL (ref 6–20)
CO2: 22 mmol/L (ref 22–32)
CREATININE: 0.97 mg/dL (ref 0.61–1.24)
Calcium: 9.4 mg/dL (ref 8.9–10.3)
Chloride: 99 mmol/L — ABNORMAL LOW (ref 101–111)
GFR calc non Af Amer: >60 mL/min (ref >60)
Glucose, Bld: 103 mg/dL — ABNORMAL HIGH (ref 65–99)
Potassium: 3.4 mmol/L — ABNORMAL LOW (ref 3.5–5.1)
Sodium: 133 mmol/L — ABNORMAL LOW (ref 135–145)

## 2015-05-04 LAB — APTT: aPTT: 36 seconds (ref 24–37)

## 2015-05-04 LAB — CBC
HCT: 43.4 % (ref 39.0–52.0)
Hemoglobin: 15.3 g/dL (ref 13.0–17.0)
MCH: 33.9 pg (ref 26.0–34.0)
MCHC: 35.3 g/dL (ref 30.0–36.0)
MCV: 96.2 fL (ref 78.0–100.0)
PLATELETS: 246 10*3/uL (ref 150–400)
RBC: 4.51 MIL/uL (ref 4.22–5.81)
RDW: 12.6 % (ref 11.5–15.5)
WBC: 6.3 10*3/uL (ref 4.0–10.5)

## 2015-05-04 LAB — PROTIME-INR
INR: 1.22 (ref 0.00–1.49)
Prothrombin Time: 15.6 seconds — ABNORMAL HIGH (ref 11.6–15.2)

## 2015-05-04 SURGERY — RADIOLOGY WITH ANESTHESIA
Anesthesia: General

## 2015-05-04 MED ORDER — PHENYLEPHRINE HCL 10 MG/ML IJ SOLN
10.0000 mg | INTRAVENOUS | Status: DC | PRN
  Administered 2015-05-04: 20 ug/min via INTRAVENOUS

## 2015-05-04 MED ORDER — ROCURONIUM BROMIDE 100 MG/10ML IV SOLN: INTRAVENOUS | Status: DC | PRN

## 2015-05-04 MED ORDER — SODIUM CHLORIDE 0.9 % IV SOLN
INTRAVENOUS | Status: DC | PRN
  Administered 2015-05-04 (×2): via INTRAVENOUS

## 2015-05-04 MED ORDER — LABETALOL HCL 5 MG/ML IV SOLN
INTRAVENOUS | Status: AC
  Filled 2015-05-04: qty 4

## 2015-05-04 MED ORDER — ESMOLOL HCL 10 MG/ML IV SOLN
INTRAVENOUS | Status: DC | PRN
  Administered 2015-05-04: 30 mg via INTRAVENOUS
  Administered 2015-05-04: 20 mg via INTRAVENOUS

## 2015-05-04 MED ORDER — VITAMIN D3 25 MCG (1000 UNIT) PO TABS
1000.0000 [IU] | ORAL_TABLET | Freq: Every day | ORAL | Status: DC
  Filled 2015-05-04: qty 1

## 2015-05-04 MED ORDER — IOHEXOL 300 MG/ML  SOLN
150.0000 mL | Freq: Once | INTRAMUSCULAR | Status: DC | PRN
  Administered 2015-05-04: 60 mL via INTRAVENOUS
  Filled 2015-05-04: qty 150

## 2015-05-04 MED ORDER — ADULT MULTIVITAMIN W/MINERALS CH
1.0000 | ORAL_TABLET | Freq: Every day | ORAL | Status: DC
  Filled 2015-05-04: qty 1

## 2015-05-04 MED ORDER — LACTATED RINGERS IV SOLN
INTRAVENOUS | Status: DC
  Administered 2015-05-04: 11:00:00 via INTRAVENOUS

## 2015-05-04 MED ORDER — ROCURONIUM BROMIDE 100 MG/10ML IV SOLN
INTRAVENOUS | Status: DC | PRN
  Administered 2015-05-04: 40 mg via INTRAVENOUS
  Administered 2015-05-04: 10 mg via INTRAVENOUS

## 2015-05-04 MED ORDER — PANTOPRAZOLE SODIUM 40 MG PO TBEC
40.0000 mg | DELAYED_RELEASE_TABLET | Freq: Every day | ORAL | Status: DC
  Administered 2015-05-04: 40 mg via ORAL
  Filled 2015-05-04: qty 1

## 2015-05-04 MED ORDER — ONDANSETRON HCL 4 MG/2ML IJ SOLN
INTRAMUSCULAR | Status: DC | PRN
Start: 2015-05-04 — End: 2015-05-04
  Administered 2015-05-04: 4 mg via INTRAVENOUS

## 2015-05-04 MED ORDER — GLYCOPYRROLATE 0.2 MG/ML IJ SOLN
INTRAMUSCULAR | Status: DC | PRN
  Administered 2015-05-04: 0.4 mg via INTRAVENOUS

## 2015-05-04 MED ORDER — HYDROCHLOROTHIAZIDE 25 MG PO TABS
25.0000 mg | ORAL_TABLET | Freq: Every day | ORAL | Status: DC
  Filled 2015-05-04: qty 1

## 2015-05-04 MED ORDER — SODIUM CHLORIDE 0.9 % IV SOLN
INTRAVENOUS | Status: DC | PRN
  Administered 2015-05-04: 14:00:00 via INTRAVENOUS

## 2015-05-04 MED ORDER — MORPHINE SULFATE 2 MG/ML IJ SOLN: 1.0000 mg | INTRAMUSCULAR | Status: DC | PRN

## 2015-05-04 MED ORDER — NEOSTIGMINE METHYLSULFATE 10 MG/10ML IV SOLN
INTRAVENOUS | Status: DC | PRN
  Administered 2015-05-04: 3 mg via INTRAVENOUS

## 2015-05-04 MED ORDER — VITAMIN C 500 MG PO TABS
1000.0000 mg | ORAL_TABLET | Freq: Every day | ORAL | Status: DC
  Filled 2015-05-04: qty 2

## 2015-05-04 MED ORDER — PROPOFOL 10 MG/ML IV BOLUS
INTRAVENOUS | Status: DC | PRN
  Administered 2015-05-04: 100 mg via INTRAVENOUS

## 2015-05-04 MED ORDER — HYDROCODONE-ACETAMINOPHEN 5-325 MG PO TABS: 1.0000 | ORAL_TABLET | ORAL | Status: DC | PRN

## 2015-05-04 MED ORDER — CLOPIDOGREL BISULFATE 75 MG PO TABS
75.0000 mg | ORAL_TABLET | Freq: Every day | ORAL | Status: DC
  Administered 2015-05-05: 75 mg via ORAL
  Filled 2015-05-04: qty 1

## 2015-05-04 MED ORDER — ONDANSETRON HCL 4 MG PO TABS: 4.0000 mg | ORAL_TABLET | ORAL | Status: DC | PRN

## 2015-05-04 MED ORDER — ATORVASTATIN CALCIUM 40 MG PO TABS
40.0000 mg | ORAL_TABLET | ORAL | Status: DC
  Filled 2015-05-04: qty 1

## 2015-05-04 MED ORDER — VITAMIN E 180 MG (400 UNIT) PO CAPS
400.0000 [IU] | ORAL_CAPSULE | Freq: Every day | ORAL | Status: DC
  Filled 2015-05-04: qty 1

## 2015-05-04 MED ORDER — HEPARIN SODIUM (PORCINE) 5000 UNIT/ML IJ SOLN
5000.0000 [IU] | Freq: Three times a day (TID) | INTRAMUSCULAR | Status: DC
  Administered 2015-05-05: 5000 [IU] via SUBCUTANEOUS
  Filled 2015-05-04 (×4): qty 1

## 2015-05-04 MED ORDER — ONDANSETRON HCL 4 MG/2ML IJ SOLN: 4.0000 mg | Freq: Once | INTRAMUSCULAR | Status: DC | PRN

## 2015-05-04 MED ORDER — ONDANSETRON HCL 4 MG/2ML IJ SOLN
4.0000 mg | INTRAMUSCULAR | Status: DC | PRN
Start: 2015-05-04 — End: 2015-05-05

## 2015-05-04 MED ORDER — LIDOCAINE HCL (CARDIAC) 20 MG/ML IV SOLN
INTRAVENOUS | Status: DC | PRN
  Administered 2015-05-04: 100 mg via INTRAVENOUS

## 2015-05-04 MED ORDER — BRIMONIDINE TARTRATE-TIMOLOL 0.2-0.5 % OP SOLN: 1.0000 [drp] | Freq: Every day | OPHTHALMIC | Status: DC

## 2015-05-04 MED ORDER — FENTANYL CITRATE (PF) 100 MCG/2ML IJ SOLN: 25.0000 ug | INTRAMUSCULAR | Status: DC | PRN

## 2015-05-04 MED ORDER — SODIUM CHLORIDE 0.9 % IV SOLN: INTRAVENOUS | Status: DC

## 2015-05-04 MED ORDER — TIMOLOL MALEATE 0.5 % OP SOLN
1.0000 [drp] | Freq: Every day | OPHTHALMIC | Status: DC
  Administered 2015-05-04: 1 [drp] via OPHTHALMIC
  Filled 2015-05-04: qty 5

## 2015-05-04 MED ORDER — LABETALOL HCL 5 MG/ML IV SOLN
10.0000 mg | INTRAVENOUS | Status: DC | PRN
  Administered 2015-05-04: 10 mg via INTRAVENOUS

## 2015-05-04 MED ORDER — HEPARIN SODIUM (PORCINE) 1000 UNIT/ML IJ SOLN
INTRAMUSCULAR | Status: DC | PRN
  Administered 2015-05-04: 1000 [IU] via INTRAVENOUS
  Administered 2015-05-04: 5000 [IU] via INTRAVENOUS

## 2015-05-04 MED ORDER — FENTANYL CITRATE (PF) 100 MCG/2ML IJ SOLN
INTRAMUSCULAR | Status: DC | PRN
Start: 2015-05-04 — End: 2015-05-04
  Administered 2015-05-04: 100 ug via INTRAVENOUS
  Administered 2015-05-04 (×3): 50 ug via INTRAVENOUS

## 2015-05-04 MED ORDER — BRIMONIDINE TARTRATE 0.2 % OP SOLN
1.0000 [drp] | Freq: Every day | OPHTHALMIC | Status: DC
  Administered 2015-05-04: 1 [drp] via OPHTHALMIC
  Filled 2015-05-04: qty 5

## 2015-05-04 MED ORDER — ASPIRIN 325 MG PO TABS
325.0000 mg | ORAL_TABLET | Freq: Every day | ORAL | Status: DC
Start: 2015-05-05 — End: 2015-05-05
  Administered 2015-05-05: 325 mg via ORAL
  Filled 2015-05-04: qty 1

## 2015-05-04 NOTE — Anesthesia Postprocedure Evaluation (Signed)
  Anesthesia Post-op Note  Patient: Damon Parrish  Procedure(s) Performed: Procedure(s): Pipeline Embolization (N/A)  Patient Location: PACU  Anesthesia Type:General  Level of Consciousness: awake and alert   Airway and Oxygen Therapy: Patient Spontanous Breathing  Post-op Pain: none  Post-op Assessment: Post-op Vital signs reviewed LLE Motor Response: Purposeful movement, Responds to commands LLE Sensation: Full sensation RLE Motor Response: Purposeful movement, Responds to commands RLE Sensation: Full sensation      Post-op Vital Signs: Reviewed  Last Vitals:  Filed Vitals:   05/04/15 1900  BP: 132/66  Pulse: 69  Temp:   Resp: 13    Complications: No apparent anesthesia complications

## 2015-05-04 NOTE — Anesthesia Procedure Notes (Signed)
Procedure Name: Intubation Date/Time: 05/04/2015 2:45 PM Performed by: Maude Leriche D Pre-anesthesia Checklist: Patient identified, Emergency Drugs available, Suction available, Patient being monitored and Timeout performed Patient Re-evaluated:Patient Re-evaluated prior to inductionOxygen Delivery Method: Circle system utilized Preoxygenation: Pre-oxygenation with 100% oxygen Intubation Type: IV induction Ventilation: Mask ventilation without difficulty Laryngoscope Size: Miller and 2 Grade View: Grade I Tube type: Subglottic suction tube Tube size: 8.0 mm Number of attempts: 1 Airway Equipment and Method: Stylet Placement Confirmation: ETT inserted through vocal cords under direct vision,  positive ETCO2 and breath sounds checked- equal and bilateral Secured at: 23 cm Tube secured with: Tape Dental Injury: Teeth and Oropharynx as per pre-operative assessment

## 2015-05-04 NOTE — Anesthesia Preprocedure Evaluation (Addendum)
Anesthesia Evaluation  Patient identified by MRN, date of birth, ID band Patient awake    Reviewed: Allergy & Precautions, NPO status , Patient's Chart, lab work & pertinent test results  Airway Mallampati: II  TM Distance: >3 FB Neck ROM: Full    Dental  (+) Implants, Dental Advisory Given,    Pulmonary Current Smoker,  1/2 PPD breath sounds clear to auscultation  Pulmonary exam normal       Cardiovascular Exercise Tolerance: Good hypertension, Pt. on medications + Peripheral Vascular Disease Normal cardiovascular examRhythm:Regular Rate:Normal     Neuro/Psych Cerebral aneurysm negative psych ROS   GI/Hepatic negative GI ROS, Neg liver ROS, GERD-  Medicated,  Endo/Other  negative endocrine ROS  Renal/GU Renal diseaseHistory of nephrolithiasis     Musculoskeletal negative musculoskeletal ROS (+)   Abdominal   Peds  Hematology negative hematology ROS (+)   Anesthesia Other Findings Day of surgery medications reviewed with the patient.  Reproductive/Obstetrics                            Anesthesia Physical Anesthesia Plan  ASA: III  Anesthesia Plan: General   Post-op Pain Management:    Induction: Intravenous  Airway Management Planned: Oral ETT  Additional Equipment: Arterial line  Intra-op Plan:   Post-operative Plan: Extubation in OR  Informed Consent: I have reviewed the patients History and Physical, chart, labs and discussed the procedure including the risks, benefits and alternatives for the proposed anesthesia with the patient or authorized representative who has indicated his/her understanding and acceptance.   Dental advisory given  Plan Discussed with: CRNA, Anesthesiologist and Surgeon  Anesthesia Plan Comments: (Discussed risks and benefits of and differences between spinal and general. Discussed risks of spinal including headache, backache, failure, bleeding,  infection, and nerve damage. Patient consents to spinal. Questions answered. Coagulation studies and platelet count acceptable.)       Anesthesia Quick Evaluation

## 2015-05-04 NOTE — H&P (Signed)
CC:  Aneurysm  HPI: Damon Parrish is a 74 year old man here for aneurysm treatment. 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 This was initially seen on an MRI done a few years ago, and only measured a few millimeters in size at that time. Repeat CT scan done more recently for sinusitis demonstrated enlargement of the aneurysm and he therefore underwent the angiogram.   He has been on Aspirin and Plavix for more than 2 weeks, including a dose this am. He reports no spontaneous bleeding, and minimal bruising.  PMH: Past Medical History  Diagnosis Date  . Hypertension   . Nonruptured cerebral aneurysm   . Kidney stone   . GERD (gastroesophageal reflux disease)     PSH: Past Surgical History  Procedure Laterality Date  . Foot surgery      Left  . Colonoscopy    . Cataract extraction w/ intraocular lens  implant, bilateral      SH: Social History  Substance Use Topics  . Smoking status: Current Every Day Smoker -- 0.50 packs/day    Types: Cigarettes  . Smokeless tobacco: Never Used  . Alcohol Use: No    MEDS: Prior to Admission medications   Medication Sig Start Date End Date Taking? Authorizing Provider  Ascorbic Acid (VITAMIN C) 1000 MG tablet Take 1,000 mg by mouth daily.   Yes Historical Provider, MD  aspirin 325 MG tablet Take 325 mg by mouth daily.   Yes Historical Provider, MD  atorvastatin (LIPITOR) 40 MG tablet Take 40 mg by mouth every other day. At bedtime   Yes Historical Provider, MD  brimonidine-timolol (COMBIGAN) 0.2-0.5 % ophthalmic solution Place 1 drop into both eyes at bedtime. Between 10pm and 11pm   Yes Historical Provider, MD  Brinzolamide-Brimonidine [facility]) 1-0.2 % SUSP Place 1 drop into both eyes 2 (two) times daily. Morning and 8pm   Yes Historical Provider, MD  cholecalciferol (VITAMIN D) 1000 UNITS tablet Take 1,000 Units by mouth daily.   Yes Historical  Provider, MD  clopidogrel (PLAVIX) 75 MG tablet Take 75 mg by mouth daily.   Yes Historical Provider, MD  hydrochlorothiazide (HYDRODIURIL) 25 MG tablet Take 25 mg by mouth daily. 10/29/14  Yes Historical Provider, MD  lansoprazole (PREVACID) 15 MG capsule Take 15 mg by mouth daily.    Yes Historical Provider, MD  Multiple Vitamin (MULTIVITAMIN WITH MINERALS) TABS tablet Take 1 tablet by mouth daily. Centrum Silver   Yes Historical Provider, MD  vitamin E 400 UNIT capsule Take 400 Units by mouth daily.   Yes Historical Provider, MD    ALLERGY: No Known Allergies  ROS: ROS  NEUROLOGIC EXAM: Awake, alert, oriented Memory and concentration grossly intact Speech fluent, appropriate CN grossly intact Motor exam: Upper Extremities Deltoid Bicep Tricep Grip  Right 5/5 5/5 5/5 5/5  Left 5/5 5/5 5/5 5/5   Lower Extremity IP Quad PF DF EHL  Right 5/5 5/5 5/5 5/5 5/5  Left 5/5 5/5 5/5 5/5 5/5   Sensation grossly intact to LT  [facility]: Diagnostic angiogram was reviewed, again demonstrating an approximately 7 mm posteriorly projecting supraclinoid right internal carotid artery aneurysm, likely anterior choroidal.  IMPRESSION: 74 year old man with enlarging now 7 mm right ICA aneurysm, with smoking as a vascular risk factor. Given the patient's good functional status, treatment of the aneurysm is reasonable.  PLAN: - Proceed with Pipeline embolization of RICA aneurysm - ICU postop  I discussed in detail the  treatment options for the aneurysm, as well as the rationale for treating the aneurysm at all. I talked to them about surgical clipping, as well as endovascular coiling and flow diversion with the pipeline embolization device. Given the size and location of her aneurysm, I recommended embolization with the pipeline device. We also spoke about the need for aspirin and Plavix both before and at least 6 months after the procedure. The risks of the procedure were explained in detail including  risk of stroke or hemorrhage during or immediately after the procedure. I also reviewed other risks of procedure including contrast reaction, nephropathy, or groin hematoma. In addition, I talked to them about the general risks of anesthesia including heart attack, stroke, or DVT/PE. The patient understood our discussion and is willing to proceed. All the patient's questions were answered.

## 2015-05-04 NOTE — Transfer of Care (Signed)
Immediate Anesthesia Transfer of Care Note  Patient: Damon Parrish  Procedure(s) Performed: Procedure(s): Pipeline Embolization (N/A)  Patient Location: PACU  Anesthesia Type:General  Level of Consciousness: awake, alert  and oriented  Airway & Oxygen Therapy: Patient Spontanous Breathing and Patient connected to nasal cannula oxygen  Post-op Assessment: Report given to RN, Post -op Vital signs reviewed and stable and Patient moving all extremities X 4  Post vital signs: Reviewed and stable  Last Vitals:  Filed Vitals:   05/04/15 1029  BP: 117/83  Pulse: 67  Temp: 36.4 C  Resp: 18    Complications: No apparent anesthesia complications

## 2015-05-04 NOTE — Op Note (Signed)
PIPELINE EMBOLIZATION OF RIGHT INTERNAL CAROTID ARTERY ANEURYSM  OPERATOR:   Dr. Consuella Lose, MD  HISTORY:   The patient is a 74 y.o. yo male with an incidentally discovered right internal carotid artery aneurysm. He presents today for pipeline embolization.  APPROACH:   The technical aspects of the procedure as well as its potential risks and benefits were reviewed with the patient. These risks included but were not limited bleeding, infection, allergic reaction, damage to organs/vital structures, stroke, non-diagnostic procedure, and the catastrophic outcomes of heart attack, coma, and death. With an understanding of these risks, informed consent was obtained and witnessed.    The patient was placed in the supine position on the angiography table and the skin of right groin prepped in the usual sterile fashion. The procedure was performed under general anesthesia.   AN 8-French sheath was introduced in the right common femoral artery using Seldinger technique.  A fluorophase sequence was used to document the sheath position.    HEPARIN: 6000 Units total.   CONTRAST AGENT: 80cc, Omnipaque 300   FLUOROSCOPY TIME: 49.3 combined AP and lateral minutes    CATHETER(S) AND WIRE(S):    6-French JB-1 Berenstein select catheter 6-French Simmons-2 select catheter 90cm Stryker Infinity guide sheath 115cm Catalyst V guide catheter 150cm Via27 microcatheter Synchro2 STD microwire 0.035" glidewire    VESSELS CATHETERIZED:   Right internal carotid Right middle cerebral artery, M3 segment   Right common femoral  VESSELS STUDIED:   Right internal carotid artery, pre-embolization Right internal carotid artery, post stent #1 Right internal carotid artery, post embolization Right internal carotid artery, final control Right femoral  PIPELINE DEVICE(S) USED: 4.73mm x 62mm (NOT DEPLOYED) 4.8mm x 74mm   PROCEDURAL NARRATIVE:   The guide sheath was advanced over the and Glidewire into the  aortic arch. Attempts were made to catheterized the right common carotid artery with the Berenstein catheter which were unsuccessful. The [place] was then removed, and the [facility] to catheter was introduced over the [employer]. It was reformed in the left subclavian artery, and the right common carotid artery was catheterized. The wire was advanced under roadmap guidance into the right internal carotid artery. The guide sheath was then advanced into the distal common carotid artery. Again under roadmap guidance, the Berenstein catheter was introduced after the [place] was removed, and the mid right internal carotid artery was catheterized. The guide sheath was then advanced into the proximal right internal carotid artery in the neck. The select catheter was then removed.   The guide catheter was then introduced over the microcatheter and microwire. Under roadmap guidance, the microcatheter and wire were advanced into the distal right middle cerebral artery, M3 segment. The guide catheter was then advanced to its final position in the supraclinoid internal carotid artery.   The above pipeline device was then introduced into the microcatheter. It was deployed across the neck of the aneurysm. During deployment, there was slight foreshortening of the distal end of the device, which no longer appeared to cover the aneurysm neck. The device was therefore re-sheath. We were unable to track the re-sheath device and microcatheter to attempt redeployment. The microcatheter was therefore removed with the device.  The via 27 microcatheter was again introduced over the microwire and navigated into the M2 segment. Microwire was then removed, and the second pipeline device was introduced. Again utilizing roadmap guidance and multiple single shots, the pipeline device was deployed in the supraclinoid internal carotid artery, across the neck of the aneurysm. Angiogram was  then taken immediate post embolization, and in a  delayed fashion as a final control.  After embolization was completed, the microcatheter was removed, and the guide catheter and guide sheath were removed synchronously without incident.  INTERPRETATION:   Right internal carotid artery, pre-embolization  injection demonstrates widely patent and neural carotid artery, middle cerebral artery, and anterior cerebral arteries. The previously described distal supraclinoid internal carotid artery aneurysm is again visualized. There is also a smaller aneurysm just proximal to the larger aneurysm again seen. Capillary and venous phases are unremarkable.   Right internal carotid artery, post stent #1 The internal carotid artery, middle cerebral artery, and anterior cerebral arteries remain widely patent. There is no stenosis or filling defects seen.  Right internal carotid artery, postembolization The internal carotid artery, middle cerebral artery, and anterior cerebral arteries are widely patent. The pipeline device is seen in the supraclinoid internal carotid artery. The distal end of the device appears to cover the aneurysm neck, and remains proximal to the ICA bifurcation. No filling defects are seen. There is minimal contrast stasis seen within the aneurysm.  Right internal carotid artery, final control Injection demonstrates widely patent distal cervical internal carotid artery, intracranial carotid artery, middle cerebral artery, and anterior cerebral arteries. The pipeline device position remained stable. There is minimal contrast stasis in the aneurysm. No filling defects are seen. Capillary phase does not demonstrate any perfusion deficits. Venous phase is unremarkable.  Right femoral:    Normal vessel. No significant atherosclerotic disease. Arterial sheath in adequate position.   DISPOSITION:  Upon completion of the study, the femoral sheath was removed and hemostasis obtained using a 7-Fr ExoSeal closure device. Good proximal and distal  lower extremity pulses were documented upon achievement of hemostasis.    The procedure was well tolerated and no early complications were observed.       The patient was extubated and  transferred back to the postanesthesia care unit for further care.   IMPRESSION:  1. Successful pipeline embolization of a supraclinoid right internal carotid artery aneurysm.  Preliminary results of this procedure were shared with the patient and the patient's family.

## 2015-05-04 NOTE — Progress Notes (Signed)
Pt has some bleeding at urinary meatus, uo is still sl. Pink tinged. ICU RN Myriam Jacobson will cont to monitor.

## 2015-05-05 ENCOUNTER — Encounter (HOSPITAL_COMMUNITY): Payer: Self-pay | Admitting: Neurosurgery

## 2015-05-05 NOTE — Discharge Summary (Signed)
  Physician Discharge Summary  Patient ID: KENDARIOUS GUDINO MRN: [id number] DOB/AGE: 1941/09/21 75 y.o.  Admit date: 05/04/2015 Discharge date: 05/05/2015  Admission Diagnoses: Cerebral aneurysm  Discharge Diagnoses: Same Active Problems:   Cerebral aneurysm   Discharged Condition: Stable  Hospital Course:  Mrs. Damon Parrish is a 74 y.o. 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 He was at neurologic baseline postop, and was observed in the ICU overnight. He was ambulating without assistance, voiding normally, tolerating diet with pain under control.  Treatments: Surgery - Pipeline embolization RICA aneurysm  Discharge Exam: Blood pressure 99/60, pulse 59, temperature 98.3 F (36.8 C), temperature source Oral, resp. rate 12, height 5\' 8"  (1.727 m), weight 75.4 kg (166 lb 3.6 oz), SpO2 98 %. Awake, alert, oriented Speech fluent, appropriate CN grossly intact 5/5 BUE/BLE Wound c/d/i  Disposition: 01-Home or Self Care     Medication List    TAKE these medications        aspirin 325 MG tablet  Take 325 mg by mouth daily.     atorvastatin 40 MG tablet  Commonly known as:  LIPITOR  Take 40 mg by mouth every other day. At bedtime     brimonidine-timolol 0.2-0.5 % ophthalmic solution  Commonly known as:  COMBIGAN  Place 1 drop into both eyes at bedtime. Between 10pm and 11pm     cholecalciferol 1000 UNITS tablet  Commonly known as:  VITAMIN D  Take 1,000 Units by mouth daily.     clopidogrel 75 MG tablet  Commonly known as:  PLAVIX  Take 75 mg by mouth daily.     hydrochlorothiazide 25 MG tablet  Commonly known as:  HYDRODIURIL  Take 25 mg by mouth daily.     lansoprazole 15 MG capsule  Commonly known as:  PREVACID  Take 15 mg by mouth daily.     multivitamin with minerals Tabs tablet  Take 1 tablet by mouth daily. Centrum Silver     SIMBRINZA 1-0.2 % Susp  Generic drug:  Brinzolamide-Brimonidine  Place 1 drop  into both eyes 2 (two) times daily. Morning and 8pm     vitamin C 1000 MG tablet  Take 1,000 mg by mouth daily.     vitamin E 400 UNIT capsule  Take 400 Units by mouth daily.           Follow-up Information    Follow up with [facility], Maitland Muhlbauer, C, MD In 2 weeks.   Specialty:  Neurosurgery   Contact information:   [address] [place] [address] [place] [address] [phone]       Signed: Consuella Lose, Loletha Grayer 05/05/2015, 9:20 AM

## 2015-05-05 NOTE — Progress Notes (Signed)
Pt foley was bleeding at insertion upon admitting to unit yesterday. Had resolved by the time foley was removed at 0545 this am per the nurse at night. Pt voided before dc home and urine was amber yellow urine but the intial flow pt noticed a blood clot passed. After voiding pt had a few blood spots. Had pt apply pressure to site after monitoring it stopped. Did dc education regarding if it starts back up or gets worse or if urine appears bloody then to call MD.  Pt dc instructions were given. Follow apt instructions given. VSS pt will dc home via wheelchair with wife.

## 2015-05-19 ENCOUNTER — Other Ambulatory Visit (HOSPITAL_COMMUNITY): Payer: Self-pay | Admitting: Neurosurgery

## 2015-05-19 ENCOUNTER — Other Ambulatory Visit: Payer: Self-pay | Admitting: Neurosurgery

## 2015-05-19 DIAGNOSIS — I671 Cerebral aneurysm, nonruptured: Secondary | ICD-10-CM

## 2015-05-30 ENCOUNTER — Encounter (HOSPITAL_COMMUNITY): Payer: Self-pay | Admitting: *Deleted

## 2015-05-31 ENCOUNTER — Encounter (HOSPITAL_COMMUNITY)
Admission: RE | Disposition: A | Discharge status: Home / Self Care | Payer: Self-pay | Source: Ambulatory Visit | Attending: Neurosurgery

## 2015-05-31 ENCOUNTER — Encounter (HOSPITAL_COMMUNITY): Payer: Self-pay | Admitting: *Deleted

## 2015-05-31 ENCOUNTER — Ambulatory Visit (HOSPITAL_COMMUNITY)
Admission: RE | Admit: 2015-05-31 | Discharge: 2015-05-31 | Disposition: A | Discharge status: Home / Self Care | Payer: Medicare Other | Source: Ambulatory Visit | Attending: Neurosurgery | Admitting: Neurosurgery

## 2015-05-31 ENCOUNTER — Inpatient Hospital Stay (HOSPITAL_COMMUNITY): Payer: Medicare Other | Admitting: Anesthesiology

## 2015-05-31 DIAGNOSIS — K219 Gastro-esophageal reflux disease without esophagitis: Secondary | ICD-10-CM | POA: Insufficient documentation

## 2015-05-31 DIAGNOSIS — I671 Cerebral aneurysm, nonruptured: Secondary | ICD-10-CM | POA: Insufficient documentation

## 2015-05-31 DIAGNOSIS — F17210 Nicotine dependence, cigarettes, uncomplicated: Secondary | ICD-10-CM | POA: Insufficient documentation

## 2015-05-31 DIAGNOSIS — Z7902 Long term (current) use of antithrombotics/antiplatelets: Secondary | ICD-10-CM | POA: Insufficient documentation

## 2015-05-31 DIAGNOSIS — Z48812 Encounter for surgical aftercare following surgery on the circulatory system: Secondary | ICD-10-CM | POA: Diagnosis not present

## 2015-05-31 DIAGNOSIS — Z7982 Long term (current) use of aspirin: Secondary | ICD-10-CM

## 2015-05-31 DIAGNOSIS — I10 Essential (primary) hypertension: Secondary | ICD-10-CM | POA: Insufficient documentation

## 2015-05-31 HISTORY — PX: RADIOLOGY WITH ANESTHESIA: SHX6223

## 2015-05-31 HISTORY — DX: Unspecified osteoarthritis, unspecified site: M19.90

## 2015-05-31 HISTORY — DX: Personal history of urinary calculi: Z87.442

## 2015-05-31 LAB — BASIC METABOLIC PANEL
ANION GAP: 9 (ref 5–15)
BUN: 10 mg/dL (ref 6–20)
CHLORIDE: 98 mmol/L — AB (ref 101–111)
CO2: 22 mmol/L (ref 22–32)
Calcium: 9.2 mg/dL (ref 8.9–10.3)
Creatinine, Ser: 0.82 mg/dL (ref 0.61–1.24)
GFR calc Af Amer: >60 mL/min (ref >60)
GLUCOSE: 106 mg/dL — AB (ref 65–99)
POTASSIUM: 3.3 mmol/L — AB (ref 3.5–5.1)
SODIUM: 129 mmol/L — AB (ref 135–145)

## 2015-05-31 LAB — CBC
HEMATOCRIT: 41.1 % (ref 39.0–52.0)
HEMOGLOBIN: 14.7 g/dL (ref 13.0–17.0)
MCH: 33.8 pg (ref 26.0–34.0)
MCHC: 35.8 g/dL (ref 30.0–36.0)
MCV: 94.5 fL (ref 78.0–100.0)
Platelets: 228 10*3/uL (ref 150–400)
RBC: 4.35 MIL/uL (ref 4.22–5.81)
RDW: 12.6 % (ref 11.5–15.5)
WBC: 6.6 10*3/uL (ref 4.0–10.5)

## 2015-05-31 LAB — PROTIME-INR
INR: 1.26 (ref 0.00–1.49)
Prothrombin Time: 16 seconds — ABNORMAL HIGH (ref 11.6–15.2)

## 2015-05-31 LAB — APTT: aPTT: 36 seconds (ref 24–37)

## 2015-05-31 SURGERY — RADIOLOGY WITH ANESTHESIA
Anesthesia: Monitor Anesthesia Care

## 2015-05-31 MED ORDER — FENTANYL CITRATE (PF) 100 MCG/2ML IJ SOLN
INTRAMUSCULAR | Status: DC | PRN
  Administered 2015-05-31: 50 ug via INTRAVENOUS

## 2015-05-31 MED ORDER — IOHEXOL 300 MG/ML  SOLN
150.0000 mL | Freq: Once | INTRAMUSCULAR | Status: DC | PRN
  Administered 2015-05-31: 20 mL via INTRA_ARTERIAL
  Filled 2015-05-31: qty 150

## 2015-05-31 MED ORDER — LACTATED RINGERS IV SOLN
INTRAVENOUS | Status: DC
  Administered 2015-05-31 (×2): via INTRAVENOUS

## 2015-05-31 MED ORDER — SODIUM CHLORIDE 0.9 % IV SOLN: INTRAVENOUS | Status: DC

## 2015-05-31 MED ORDER — HYDROCODONE-ACETAMINOPHEN 5-325 MG PO TABS: 1.0000 | ORAL_TABLET | ORAL | Status: DC | PRN

## 2015-05-31 MED ORDER — LACTATED RINGERS IV SOLN
INTRAVENOUS | Status: DC | PRN
  Administered 2015-05-31: 14:00:00 via INTRAVENOUS

## 2015-05-31 MED ORDER — LIDOCAINE HCL 1 % IJ SOLN
INTRAMUSCULAR | Status: AC
  Filled 2015-05-31: qty 20

## 2015-05-31 MED ORDER — HEPARIN SODIUM (PORCINE) 1000 UNIT/ML IJ SOLN
INTRAMUSCULAR | Status: DC | PRN
  Administered 2015-05-31: 2000 [IU] via INTRAVENOUS

## 2015-05-31 MED ORDER — CEFAZOLIN SODIUM-DEXTROSE 2-3 GM-% IV SOLR
2.0000 g | INTRAVENOUS | Status: AC
  Administered 2015-05-31: 2 g via INTRAVENOUS
  Filled 2015-05-31: qty 50

## 2015-05-31 NOTE — Interval H&P Note (Signed)
History and Physical Interval Note:  05/31/2015 2:00 PM  Mr. Miske presents today for f/u diagnostic angiogram after undergoing Pipeline embolization of a RICA aneurysm about 3 weeks ago. The distal device position is somewhat tenuous, and there is concern for shortening which would leave the aneurysm uncovered. Should the device have moved, we will proceed with placement of another telescoped Pipeline device. The risks and benefits of this plan were reviewed in detail with the patient in the office. After all questions were answered, he provided consent to proceed. Of note, he continues taking Aspirin 325mg  and Plavix 75mg  daily, including a dose today.   Bernese Doffing, C

## 2015-05-31 NOTE — Progress Notes (Signed)
Pt has slight ooze to the inner right thigh not near artery site. Dr Kathyrn Sheriff was called and he said to ambulate and reassess. Pt was ambulated and no bleeding, no hematoma.

## 2015-05-31 NOTE — Transfer of Care (Signed)
Immediate Anesthesia Transfer of Care Note  Patient: Damon Parrish  Procedure(s) Performed: Procedure(s): Pipeline Embolization (N/A)  Patient Location: PACU  Anesthesia Type:MAC  Level of Consciousness: awake, alert  and oriented  Airway & Oxygen Therapy: Patient Spontanous Breathing  Post-op Assessment: Report given to RN and Post -op Vital signs reviewed and stable  Post vital signs: Reviewed and stable  Last Vitals:  Filed Vitals:   05/31/15 1033  BP: 119/90  Pulse: 71  Temp: 36.1 C  Resp: 18    Complications: No apparent anesthesia complications

## 2015-05-31 NOTE — H&P (View-Only) (Signed)
CC:  Aneurysm  HPI: Damon Parrish is a 74-year-old man here for aneurysm treatment. 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 This was initially seen on an MRI done a few years ago, and only measured a few millimeters in size at that time. Repeat CT scan done more recently for sinusitis demonstrated enlargement of the aneurysm and he therefore underwent the angiogram.   He has been on Aspirin and Plavix for more than 2 weeks, including a dose this am. He reports no spontaneous bleeding, and minimal bruising.  PMH: Past Medical History  Diagnosis Date  . Hypertension   . Nonruptured cerebral aneurysm   . Kidney stone   . GERD (gastroesophageal reflux disease)     PSH: Past Surgical History  Procedure Laterality Date  . Foot surgery      Left  . Colonoscopy    . Cataract extraction w/ intraocular lens  implant, bilateral      SH: Social History  Substance Use Topics  . Smoking status: Current Every Day Smoker -- 0.50 packs/day    Types: Cigarettes  . Smokeless tobacco: Never Used  . Alcohol Use: No    MEDS: Prior to Admission medications   Medication Sig Start Date End Date Taking? Authorizing Provider  Ascorbic Acid (VITAMIN C) 1000 MG tablet Take 1,000 mg by mouth daily.   Yes Historical Provider, MD  aspirin 325 MG tablet Take 325 mg by mouth daily.   Yes Historical Provider, MD  atorvastatin (LIPITOR) 40 MG tablet Take 40 mg by mouth every other day. At bedtime   Yes Historical Provider, MD  brimonidine-timolol (COMBIGAN) 0.2-0.5 % ophthalmic solution Place 1 drop into both eyes at bedtime. Between 10pm and 11pm   Yes Historical Provider, MD  Brinzolamide-Brimonidine (SIMBRINZA) 1-0.2 % SUSP Place 1 drop into both eyes 2 (two) times daily. Morning and 8pm   Yes Historical Provider, MD  cholecalciferol (VITAMIN D) 1000 UNITS tablet Take 1,000 Units by mouth daily.   Yes Historical  Provider, MD  clopidogrel (PLAVIX) 75 MG tablet Take 75 mg by mouth daily.   Yes Historical Provider, MD  hydrochlorothiazide (HYDRODIURIL) 25 MG tablet Take 25 mg by mouth daily. 10/29/14  Yes Historical Provider, MD  lansoprazole (PREVACID) 15 MG capsule Take 15 mg by mouth daily.    Yes Historical Provider, MD  Multiple Vitamin (MULTIVITAMIN WITH MINERALS) TABS tablet Take 1 tablet by mouth daily. Centrum Silver   Yes Historical Provider, MD  vitamin E 400 UNIT capsule Take 400 Units by mouth daily.   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  IMPRESSION: 74-year-old man with enlarging now 7 mm right ICA aneurysm, with smoking as a vascular risk factor. Given the patient's good functional status, treatment of the aneurysm is reasonable.  PLAN: - Proceed with Pipeline embolization of RICA aneurysm - ICU postop  I discussed in detail the   treatment options for the aneurysm, as well as the rationale for treating the aneurysm at all. I talked to them about surgical clipping, as well as endovascular coiling and flow diversion with the pipeline embolization device. Given the size and location of her aneurysm, I recommended embolization with the pipeline device. We also spoke about the need for aspirin and Plavix both before and at least 6 months after the procedure. The risks of the procedure were explained in detail including  risk of stroke or hemorrhage during or immediately after the procedure. I also reviewed other risks of procedure including contrast reaction, nephropathy, or groin hematoma. In addition, I talked to them about the general risks of anesthesia including heart attack, stroke, or DVT/PE. The patient understood our discussion and is willing to proceed. All the patient's questions were answered.    

## 2015-05-31 NOTE — Discharge Instructions (Signed)

## 2015-05-31 NOTE — Op Note (Signed)
DIAGNOSTIC CEREBRAL ANGIOGRAM    OPERATOR:   Dr. Consuella Lose, MD  HISTORY:   The patient is a 74 y.o. yo male who underwent pipeline embolization of a right internal carotid artery aneurysm approximately 3 weeks ago. The distal end of the pipeline device appeared to be somewhat tenuous, and he was brought in for short-term angiographic follow-up, with the possibility of requiring placement of a second pipeline device.  APPROACH:   The technical aspects of the procedure as well as its potential risks and benefits were reviewed with the patient. These risks included but were not limited bleeding, infection, allergic reaction, damage to organs/vital structures, stroke, non-diagnostic procedure, and the catastrophic outcomes of heart attack, coma, and death. With an understanding of these risks, informed consent was obtained and witnessed.    The patient was placed in the supine position on the angiography table and the skin of right groin prepped in the usual sterile fashion. The procedure was performed under local anesthesia (1%-solution of bicarbonate-bufferred Lidoacaine) and conscious sedation with Versed and fentanyl monitored by the anesthesia service.    A 5- French sheath was introduced in the right common femoral artery using Seldinger technique.  A fluorophase sequence was used to document the sheath position.    HEPARIN: 2000 Units total.   CONTRAST AGENT: 15cc, Omnipaque 300   FLUOROSCOPY TIME: See IR records    CATHETER(S) AND WIRE(S):    5-French Cook JB 1 catheter   0.035" glidewire    VESSELS CATHETERIZED:   Right internal carotid   Right common femoral  VESSELS STUDIED:   Right internal carotid, head Right femoral  PROCEDURAL NARRATIVE:   A 5-Fr JB-1 terumo glide catheter was advanced over a 0.035 glidewire into the aortic arch. The above vessels were then sequentially catheterized and cerebral angiograms taken. After review of images, the catheter was removed  without incident.    INTERPRETATION:   Right internal carotid: head:   Injection reveals the presence of a widely patent ICA, M1, and A1 segments and their branches. The pipeline device is seen extending from the cavernous to supraclinoid internal carotid artery. The distal end of the pipeline device appears to cover the aneurysm neck. There is now moderate to significant contrast stasis seen within the aneurysm, in comparison to immediate postembolization angiogram at which time there was no contrast stasis within the aneurysm. No filling defects are seen within the device.  The parenchymal and venous phases are normal. The venous sinuses are widely patent.    Right femoral:    Normal vessel. No significant atherosclerotic disease. Arterial sheath in adequate position.   DISPOSITION:  Upon completion of the study, the femoral sheath was removed and hemostasis obtained using a 5-Fr ExoSeal closure device. Good proximal and distal lower extremity pulses were documented upon achievement of hemostasis.    The procedure was well tolerated and no early complications were observed.       The patient was transferred back to the holding area to be positioned flat in bed for 3 hours of observation.    IMPRESSION:  1. Stable position of the previously implanted pipeline embolization device across the supraclinoid right internal carotid artery aneurysm. It is now moderate contrast stasis seen within the aneurysm in comparison to immediate postembolization angiogram at which time there was no stasis seen.  The preliminary results of this procedure were shared with the patient and the patient's family.

## 2015-05-31 NOTE — Anesthesia Preprocedure Evaluation (Addendum)
Anesthesia Evaluation  Patient identified by MRN, date of birth, ID band Patient awake    Reviewed: Allergy & Precautions, NPO status , Patient's Chart, lab work & pertinent test results  History of Anesthesia Complications Negative for: history of anesthetic complications  Airway Mallampati: II  TM Distance: >3 FB Neck ROM: Full    Dental  (+) Implants, Dental Advisory Given,    Pulmonary Current Smoker,  1/2 PPD   Pulmonary exam normal breath sounds clear to auscultation       Cardiovascular Exercise Tolerance: Good hypertension, Pt. on medications + Peripheral Vascular Disease  Normal cardiovascular exam Rhythm:Regular Rate:Normal     Neuro/Psych Cerebral aneurysm s/p pipeline stenting negative psych ROS   GI/Hepatic negative GI ROS, Neg liver ROS, GERD  Medicated,  Endo/Other  negative endocrine ROS  Renal/GU Renal diseaseHistory of nephrolithiasis     Musculoskeletal  (+) Arthritis , Osteoarthritis,    Abdominal   Peds  Hematology negative hematology ROS (+)   Anesthesia Other Findings Day of surgery medications reviewed with the patient.  Reproductive/Obstetrics                           Anesthesia Physical  Anesthesia Plan  ASA: III  Anesthesia Plan: General and MAC   Post-op Pain Management:    Induction: Intravenous  Airway Management Planned: Oral ETT and Nasal Cannula  Additional Equipment: Arterial line  Intra-op Plan:   Post-operative Plan: Extubation in OR  Informed Consent: I have reviewed the patients History and Physical, chart, labs and discussed the procedure including the risks, benefits and alternatives for the proposed anesthesia with the patient or authorized representative who has indicated his/her understanding and acceptance.   Dental advisory given  Plan Discussed with: CRNA, Anesthesiologist and Surgeon  Anesthesia Plan Comments: (Plan to  start out with MAC sedation for angiography.  Will do GA if indicated.  Risks/benefits of general anesthesia discussed with patient including risk of damage to teeth, lips, gum, and tongue, nausea/vomiting, allergic reactions to medications, and the possibility of heart attack, stroke and death.  All patient questions answered.  Patient wishes to proceed.)      Anesthesia Quick Evaluation

## 2015-06-01 ENCOUNTER — Encounter (HOSPITAL_COMMUNITY): Payer: Self-pay | Admitting: Neurosurgery

## 2015-06-01 NOTE — Anesthesia Postprocedure Evaluation (Signed)
Anesthesia Post Note  Patient: Damon Parrish  Procedure(s) Performed: Procedure(s) (LRB): Pipeline Embolization (N/A)  Anesthesia type: general  Patient location: PACU  Post pain: Pain level controlled  Post assessment: Patient's Cardiovascular Status Stable  Last Vitals:  Filed Vitals:   05/31/15 1634  BP:   Pulse:   Temp: 36.5 C  Resp:     Post vital signs: Reviewed and stable  Level of consciousness: sedated  Complications: No apparent anesthesia complications

## 2015-12-12 ENCOUNTER — Other Ambulatory Visit: Payer: Self-pay | Admitting: Neurosurgery

## 2015-12-12 ENCOUNTER — Other Ambulatory Visit (HOSPITAL_COMMUNITY): Payer: Self-pay | Admitting: Neurosurgery

## 2015-12-12 DIAGNOSIS — I671 Cerebral aneurysm, nonruptured: Secondary | ICD-10-CM

## 2015-12-23 DIAGNOSIS — I639 Cerebral infarction, unspecified: Secondary | ICD-10-CM

## 2015-12-23 HISTORY — DX: Cerebral infarction, unspecified: I63.9

## 2015-12-28 ENCOUNTER — Inpatient Hospital Stay (HOSPITAL_COMMUNITY): Payer: Medicare Other

## 2015-12-28 ENCOUNTER — Inpatient Hospital Stay (HOSPITAL_COMMUNITY)
Admission: EM | Admit: 2015-12-28 | Discharge: 2016-01-03 | DRG: 100 | Disposition: A | Discharge status: Home Health | Payer: Medicare Other | Attending: Internal Medicine | Admitting: Internal Medicine

## 2015-12-28 ENCOUNTER — Encounter (HOSPITAL_COMMUNITY): Payer: Self-pay | Admitting: *Deleted

## 2015-12-28 ENCOUNTER — Emergency Department (HOSPITAL_COMMUNITY): Payer: Medicare Other

## 2015-12-28 DIAGNOSIS — Z7951 Long term (current) use of inhaled steroids: Secondary | ICD-10-CM | POA: Diagnosis not present

## 2015-12-28 DIAGNOSIS — E785 Hyperlipidemia, unspecified: Secondary | ICD-10-CM | POA: Insufficient documentation

## 2015-12-28 DIAGNOSIS — G8194 Hemiplegia, unspecified affecting left nondominant side: Secondary | ICD-10-CM | POA: Diagnosis present

## 2015-12-28 DIAGNOSIS — R569 Unspecified convulsions: Secondary | ICD-10-CM | POA: Diagnosis present

## 2015-12-28 DIAGNOSIS — T502X5A Adverse effect of carbonic-anhydrase inhibitors, benzothiadiazides and other diuretics, initial encounter: Secondary | ICD-10-CM | POA: Diagnosis present

## 2015-12-28 DIAGNOSIS — I739 Peripheral vascular disease, unspecified: Secondary | ICD-10-CM | POA: Diagnosis present

## 2015-12-28 DIAGNOSIS — Z7982 Long term (current) use of aspirin: Secondary | ICD-10-CM

## 2015-12-28 DIAGNOSIS — Z9841 Cataract extraction status, right eye: Secondary | ICD-10-CM

## 2015-12-28 DIAGNOSIS — Z6823 Body mass index (BMI) 23.0-23.9, adult: Secondary | ICD-10-CM | POA: Diagnosis not present

## 2015-12-28 DIAGNOSIS — Z7902 Long term (current) use of antithrombotics/antiplatelets: Secondary | ICD-10-CM

## 2015-12-28 DIAGNOSIS — I671 Cerebral aneurysm, nonruptured: Secondary | ICD-10-CM | POA: Insufficient documentation

## 2015-12-28 DIAGNOSIS — Z961 Presence of intraocular lens: Secondary | ICD-10-CM | POA: Diagnosis present

## 2015-12-28 DIAGNOSIS — I10 Essential (primary) hypertension: Secondary | ICD-10-CM | POA: Diagnosis present

## 2015-12-28 DIAGNOSIS — G40109 Localization-related (focal) (partial) symptomatic epilepsy and epileptic syndromes with simple partial seizures, not intractable, without status epilepticus: Secondary | ICD-10-CM | POA: Diagnosis present

## 2015-12-28 DIAGNOSIS — I6789 Other cerebrovascular disease: Secondary | ICD-10-CM | POA: Diagnosis not present

## 2015-12-28 DIAGNOSIS — E876 Hypokalemia: Secondary | ICD-10-CM | POA: Diagnosis present

## 2015-12-28 DIAGNOSIS — I639 Cerebral infarction, unspecified: Secondary | ICD-10-CM | POA: Diagnosis present

## 2015-12-28 DIAGNOSIS — Z807 Family history of other malignant neoplasms of lymphoid, hematopoietic and related tissues: Secondary | ICD-10-CM | POA: Diagnosis not present

## 2015-12-28 DIAGNOSIS — Z95828 Presence of other vascular implants and grafts: Secondary | ICD-10-CM

## 2015-12-28 DIAGNOSIS — Z79899 Other long term (current) drug therapy: Secondary | ICD-10-CM | POA: Diagnosis not present

## 2015-12-28 DIAGNOSIS — Z9842 Cataract extraction status, left eye: Secondary | ICD-10-CM

## 2015-12-28 DIAGNOSIS — I63511 Cerebral infarction due to unspecified occlusion or stenosis of right middle cerebral artery: Secondary | ICD-10-CM | POA: Insufficient documentation

## 2015-12-28 DIAGNOSIS — K219 Gastro-esophageal reflux disease without esophagitis: Secondary | ICD-10-CM | POA: Diagnosis present

## 2015-12-28 DIAGNOSIS — E871 Hypo-osmolality and hyponatremia: Secondary | ICD-10-CM | POA: Diagnosis present

## 2015-12-28 DIAGNOSIS — R32 Unspecified urinary incontinence: Secondary | ICD-10-CM | POA: Diagnosis present

## 2015-12-28 DIAGNOSIS — F17210 Nicotine dependence, cigarettes, uncomplicated: Secondary | ICD-10-CM | POA: Diagnosis present

## 2015-12-28 DIAGNOSIS — M1990 Unspecified osteoarthritis, unspecified site: Secondary | ICD-10-CM | POA: Diagnosis present

## 2015-12-28 DIAGNOSIS — R251 Tremor, unspecified: Secondary | ICD-10-CM

## 2015-12-28 HISTORY — DX: Other specified postprocedural states: Z98.890

## 2015-12-28 HISTORY — DX: Personal history of other diseases of the circulatory system: Z86.79

## 2015-12-28 HISTORY — DX: Other specified symptoms and signs involving the circulatory and respiratory systems: R09.89

## 2015-12-28 HISTORY — DX: Unspecified convulsions: R56.9

## 2015-12-28 HISTORY — DX: Pure hypercholesterolemia, unspecified: E78.00

## 2015-12-28 LAB — CBC WITH DIFFERENTIAL/PLATELET
Basophils Absolute: 0 10*3/uL (ref 0.0–0.1)
Basophils Relative: 0 %
EOS ABS: 0.1 10*3/uL (ref 0.0–0.7)
EOS PCT: 1 %
HCT: 42.8 % (ref 39.0–52.0)
Hemoglobin: 15 g/dL (ref 13.0–17.0)
LYMPHS ABS: 2.3 10*3/uL (ref 0.7–4.0)
Lymphocytes Relative: 29 %
MCH: 33.1 pg (ref 26.0–34.0)
MCHC: 35 g/dL (ref 30.0–36.0)
MCV: 94.5 fL (ref 78.0–100.0)
MONO ABS: 0.7 10*3/uL (ref 0.1–1.0)
Monocytes Relative: 9 %
Neutro Abs: 4.8 10*3/uL (ref 1.7–7.7)
Neutrophils Relative %: 61 %
PLATELETS: 269 10*3/uL (ref 150–400)
RBC: 4.53 MIL/uL (ref 4.22–5.81)
RDW: 12.6 % (ref 11.5–15.5)
WBC: 7.9 10*3/uL (ref 4.0–10.5)

## 2015-12-28 LAB — COMPREHENSIVE METABOLIC PANEL
ALBUMIN: 4.2 g/dL (ref 3.5–5.0)
ALT: 19 U/L (ref 17–63)
AST: 26 U/L (ref 15–41)
Alkaline Phosphatase: 99 U/L (ref 38–126)
Anion gap: 14 (ref 5–15)
BILIRUBIN TOTAL: 0.7 mg/dL (ref 0.3–1.2)
BUN: 10 mg/dL (ref 6–20)
CO2: 21 mmol/L — ABNORMAL LOW (ref 22–32)
CREATININE: 0.84 mg/dL (ref 0.61–1.24)
Calcium: 9.5 mg/dL (ref 8.9–10.3)
Chloride: 94 mmol/L — ABNORMAL LOW (ref 101–111)
GFR calc Af Amer: >60 mL/min (ref >60)
GLUCOSE: 108 mg/dL — AB (ref 65–99)
Potassium: 3.5 mmol/L (ref 3.5–5.1)
Sodium: 129 mmol/L — ABNORMAL LOW (ref 135–145)
TOTAL PROTEIN: 7.3 g/dL (ref 6.5–8.1)

## 2015-12-28 LAB — MRSA PCR SCREENING: MRSA BY PCR: NEGATIVE

## 2015-12-28 MED ORDER — ACETAMINOPHEN 650 MG RE SUPP: 650.0000 mg | Freq: Four times a day (QID) | RECTAL | Status: DC | PRN

## 2015-12-28 MED ORDER — ACETAMINOPHEN 325 MG PO TABS: 650.0000 mg | ORAL_TABLET | Freq: Four times a day (QID) | ORAL | Status: DC | PRN

## 2015-12-28 MED ORDER — SODIUM CHLORIDE 0.9% FLUSH
3.0000 mL | Freq: Two times a day (BID) | INTRAVENOUS | Status: DC
  Administered 2015-12-28 – 2016-01-03 (×9): 3 mL via INTRAVENOUS

## 2015-12-28 MED ORDER — ATORVASTATIN CALCIUM 40 MG PO TABS
40.0000 mg | ORAL_TABLET | ORAL | Status: DC
  Administered 2015-12-29: 40 mg via ORAL
  Filled 2015-12-28: qty 1

## 2015-12-28 MED ORDER — ONDANSETRON HCL 4 MG/2ML IJ SOLN: 4.0000 mg | Freq: Four times a day (QID) | INTRAMUSCULAR | Status: DC | PRN

## 2015-12-28 MED ORDER — ENOXAPARIN SODIUM 40 MG/0.4ML ~~LOC~~ SOLN
40.0000 mg | SUBCUTANEOUS | Status: DC
  Administered 2015-12-28 – 2016-01-02 (×6): 40 mg via SUBCUTANEOUS
  Filled 2015-12-28 (×6): qty 0.4

## 2015-12-28 MED ORDER — SENNOSIDES-DOCUSATE SODIUM 8.6-50 MG PO TABS: 1.0000 | ORAL_TABLET | Freq: Every evening | ORAL | Status: DC | PRN

## 2015-12-28 MED ORDER — SODIUM CHLORIDE 0.9 % IV SOLN
1500.0000 mg | Freq: Once | INTRAVENOUS | Status: AC
  Administered 2015-12-28: 1500 mg via INTRAVENOUS
  Filled 2015-12-28: qty 15

## 2015-12-28 MED ORDER — SODIUM CHLORIDE 0.9 % IV SOLN
1000.0000 mg | Freq: Two times a day (BID) | INTRAVENOUS | Status: DC
  Administered 2015-12-28 – 2015-12-29 (×2): 1000 mg via INTRAVENOUS
  Filled 2015-12-28 (×3): qty 10

## 2015-12-28 MED ORDER — LEVETIRACETAM 500 MG PO TABS: 1000.0000 mg | ORAL_TABLET | Freq: Two times a day (BID) | ORAL | Status: DC

## 2015-12-28 MED ORDER — ASPIRIN 325 MG PO TABS
325.0000 mg | ORAL_TABLET | Freq: Every day | ORAL | Status: DC
  Administered 2015-12-29 – 2015-12-31 (×3): 325 mg via ORAL
  Filled 2015-12-28 (×3): qty 1

## 2015-12-28 MED ORDER — LORAZEPAM 2 MG/ML IJ SOLN
2.0000 mg | Freq: Once | INTRAMUSCULAR | Status: AC
  Administered 2015-12-28: 2 mg via INTRAVENOUS

## 2015-12-28 MED ORDER — SODIUM CHLORIDE 0.9 % IV SOLN
INTRAVENOUS | Status: DC
  Administered 2015-12-28 (×2): via INTRAVENOUS

## 2015-12-28 MED ORDER — LORAZEPAM 2 MG/ML IJ SOLN
INTRAMUSCULAR | Status: AC
  Administered 2015-12-28: 2 mg via INTRAVENOUS
  Filled 2015-12-28: qty 1

## 2015-12-28 MED ORDER — HYDROCHLOROTHIAZIDE 25 MG PO TABS
25.0000 mg | ORAL_TABLET | Freq: Every day | ORAL | Status: DC
  Filled 2015-12-28 (×2): qty 1

## 2015-12-28 MED ORDER — LORAZEPAM 2 MG/ML IJ SOLN: 1.0000 mg | INTRAMUSCULAR | Status: DC | PRN

## 2015-12-28 MED ORDER — CLOPIDOGREL BISULFATE 75 MG PO TABS
75.0000 mg | ORAL_TABLET | Freq: Every day | ORAL | Status: DC
  Administered 2015-12-29 – 2016-01-03 (×6): 75 mg via ORAL
  Filled 2015-12-28 (×6): qty 1

## 2015-12-28 MED ORDER — ONDANSETRON HCL 4 MG PO TABS: 4.0000 mg | ORAL_TABLET | Freq: Four times a day (QID) | ORAL | Status: DC | PRN

## 2015-12-28 MED ORDER — LORAZEPAM 2 MG/ML IJ SOLN
1.0000 mg | Freq: Once | INTRAMUSCULAR | Status: AC
  Administered 2015-12-28: 1 mg via INTRAVENOUS
  Filled 2015-12-28: qty 1

## 2015-12-28 MED ORDER — PANTOPRAZOLE SODIUM 20 MG PO TBEC
20.0000 mg | DELAYED_RELEASE_TABLET | Freq: Every day | ORAL | Status: DC
  Administered 2015-12-29 – 2016-01-03 (×6): 20 mg via ORAL
  Filled 2015-12-28 (×8): qty 1

## 2015-12-28 MED ORDER — FOSPHENYTOIN SODIUM 500 MG PE/10ML IJ SOLN
20.0000 mg/kg | Freq: Once | INTRAMUSCULAR | Status: AC
  Administered 2015-12-28: 1406 mg via INTRAVENOUS
  Filled 2015-12-28: qty 28.12

## 2015-12-28 MED ORDER — LORAZEPAM 2 MG/ML IJ SOLN
1.0000 mg | Freq: Once | INTRAMUSCULAR | Status: AC | PRN
  Administered 2015-12-29: 1 mg via INTRAVENOUS
  Filled 2015-12-28: qty 1

## 2015-12-28 NOTE — H&P (Signed)
History and Physical  Patient Name: Damon Parrish     [id number]    DOB: 75-20-1942    DOA: 12/28/2015 Referring physician: Fredia Sorrow, MD PCP: Pcp Not In System      Chief Complaint: Arm twitching  HPI: Damon Parrish is a 75 y.o. male with a past medical history significant for HTN and RIGHT ICA aneurysm treated endovascularly Sep 2016 who presents with left sided twitching.  Caveat 5 given patient sedation from lorazepam.  The patient had a right-sided ICA aneurysm treated with a pipeline endovascular stent last September. He was to have angiography of the stent this week, and had stopped his Plavix one week ago.  Past medical at supper, the patient's wife noticed that he had a large amplitude coarse left-sided arm tremor that he said he couldn't control.  Overnight, she noticed he was up frequently, and when she checked on him he said he was having the left arm tremor as well as neck tremor/twitching, so this morning she brought him to the ER.  In the ED, the patient had a CT of the head that was unremarkable, and witnessed clonic jerking of the left side arm and leg that was witnessed by neurology and thought to be related to focal seizures.  He was loaded with fosphenytoin and given lorazepam, and TRH were asked to admit.  The patient was incontinent of urine while on EEG.     Review of Systems:  Pt complains of nothing. Pt denies any chest pain, dyspnea, weakness.  Wife denies GTC, motor weakness, history of seizures, speech disturbance, fever, chills. All other systems negative except as just noted or noted in the history of present illness.  No Known Allergies  Prior to Admission medications   Medication Sig Start Date End Date Taking? Authorizing Provider  Ascorbic Acid (VITAMIN C) 1000 MG tablet Take 1,000 mg by mouth daily.   Yes Historical Provider, MD  aspirin 325 MG tablet Take 325 mg by mouth daily.   Yes Historical Provider, MD  atorvastatin (LIPITOR) 40 MG  tablet Take 40 mg by mouth every other day. At bedtime   Yes Historical Provider, MD  beta carotene w/minerals (OCUVITE) tablet Take 1 tablet by mouth daily.   Yes Historical Provider, MD  Brinzolamide-Brimonidine [facility]) 1-0.2 % SUSP Place 1 drop into both eyes every evening. Morning and 8pm   Yes Historical Provider, MD  cholecalciferol (VITAMIN D) 1000 UNITS tablet Take 1,000 Units by mouth daily.   Yes Historical Provider, MD  clopidogrel (PLAVIX) 75 MG tablet Take 75 mg by mouth daily.   Yes Historical Provider, MD  fluticasone (FLONASE) 50 MCG/ACT nasal spray Place 1 spray into both nostrils daily.   Yes Historical Provider, MD  hydrochlorothiazide (HYDRODIURIL) 25 MG tablet Take 25 mg by mouth daily. 10/29/14  Yes Historical Provider, MD  lansoprazole (PREVACID) 15 MG capsule Take 15 mg by mouth daily.    Yes Historical Provider, MD  Multiple Vitamin (MULTIVITAMIN WITH MINERALS) TABS tablet Take 1 tablet by mouth daily. Centrum Silver   Yes Historical Provider, MD  Omega-3 Fatty Acids (FISH OIL PO) Take 1 capsule by mouth daily.   Yes Historical Provider, MD  TRAVATAN Z 0.004 % SOLN ophthalmic solution Place 1 drop into both eyes 2 (two) times daily.  02/27/15  Yes Historical Provider, MD  vitamin E 400 UNIT capsule Take 400 Units by mouth daily.   Yes Historical Provider, MD    Past Medical History  Diagnosis Date  .  Hypertension   . Nonruptured cerebral aneurysm   . GERD (gastroesophageal reflux disease)   . History of kidney stones   . Arthritis     Past Surgical History  Procedure Laterality Date  . Foot surgery Left   . Colonoscopy    . Cataract extraction w/ intraocular lens  implant, bilateral Bilateral   . Radiology with anesthesia N/A 05/04/2015    Procedure: Pipeline Embolization;  Surgeon: Consuella Lose, MD;  Location: [place] [facility];  Service: Radiology;  Laterality: N/A;  . Radiology with anesthesia N/A 05/31/2015    Procedure: Pipeline Embolization;  Surgeon:  Consuella Lose, MD;  Location: [facility];  Service: Radiology;  Laterality: N/A;    Family history: family history includes Aneurysm in his father; Dementia in his mother; Lymphoma in his mother.  Social History: Patient lives with his wife.  He is physically active, does yardwork without difficulty.  No dementia noted.  Smokes actively.  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  Now lives in [place].  No alcohol.       Physical Exam: BP 170/90 mmHg  Pulse 76  Temp(Src) 98 F (36.7 C) (Oral)  Resp 16  Ht 5\' 8"  (1.727 m)  Wt 70.308 kg (155 lb)  BMI 23.57 kg/m2  SpO2 99% General appearance: Well-developed, elderly adult male, sleeping, but rousable to voice, sedated from lorazepam, has micturated on himself.   Eyes: Anicteric, conjunctiva pink, lids and lashes normal.     ENT: No nasal deformity, discharge, or epistaxis.  OP moist without lesions.   Lymph: No cervical or supraclavicular lymphadenopathy. Skin: Warm and dry.  No jaundice.  No suspicious rashes or lesions. Cardiac: RRR, nl S1-S2, no murmurs appreciated.  Capillary refill is brisk.  JVP normal.  No LE edema.  Radial  pulses 2+ and symmetric. Respiratory: Normal respiratory rate and rhythm.  CTAB without rales or wheezes. Abdomen: Abdomen soft without rigidity.  No TTP. No ascites, distension.   MSK: No deformities or effusions. Neuro: Moves arms equally.  Globally weak and sedated from lorazepam.  No clonic jerking.  Responding to questions.  Otherwise too sedated to follow commands.  Psych: Unable to assess at present.    Labs on Admission:  The metabolic panel shows hyponatremia, stable from previous. The complete blood count shows no leukocytosis, anemia or thrombocytopenia.   Radiological Exams on Admission: Ct Head Wo Contrast  12/28/2015  CLINICAL DATA:  History of aneurysm.  Uncontrolled tremors. EXAM: CT HEAD WITHOUT CONTRAST TECHNIQUE: Contiguous axial images were obtained from the base of the skull  through the vertex without intravenous contrast. COMPARISON:  Brain MRI 01/31/2011 FINDINGS: Skull and Sinuses:No fracture destructive process. Mild scattered mucosal thickening and secretion retention in the paranasal sinuses. 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 Cataract resection on the right. Brain: No evidence of acute infarction, hemorrhage, hydrocephalus, or mass lesion/mass effect. Extra-axial CSF accumulation along the cerebral convexities, slightly greater on the right, stable from 2012. Vessels appeared traversed these CSF, arguing against hygroma. Stable cerebral volume. Normal white matter appearance. 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 IMPRESSION: 1. No acute finding. 2. Treated right ICA aneurysm.  Otherwise, stable from 2012 Electronically Signed   By: Monte Fantasia M.D.   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  The patient's tremor activity is thought to be seizures by Neurology.  It is interesting that this is  hemispheric on the side of his ICA stent.     -MR brain with and without contrast -Seizure precautions, and lorazepam PRN for recurrent seizures >5 minutes -EEG completed -Keppra maintenance dose, per neurology -  2. HTN:  Hypertensive at admission. -Continue HCTZ  3. Hyponatremia:  Chronic, stable, likely from HCTZ.  -Check free water clearance and urine sodium  4. PVD with ICA stent: -I will restart his aspirin and Plavix, given that his angiogram will have to be delayed    DVT PPx: Lovenox Diet: Heart Healthy Consultants: Neuro Code Status: FULL Family Communication: Wife, at bedside.  CODE STATUS confirmed, differential discussed, overnight treatment plan discussed.  Medical decision making: What exists of the patient's previous chart was reviewed in depth and the case was discussed with Dr. Silverio Decamp and Dr.  Rogene Houston. Patient seen 2:32 PM on 12/28/2015.  Disposition Plan:  I recommend admission to stepdown, inpatient status.  Clinical condition: stable, but ongoing seizures.  Anticipate MR brain, EEG and Keppra load.  Further disposition per Neurology.      Edwin Dada [facility] Pager [phone]

## 2015-12-28 NOTE — ED Notes (Signed)
Pt back from EEG with pants and gown wet.  Pt and bed changed.

## 2015-12-28 NOTE — Progress Notes (Signed)
Pt came to MR for brain scan.  Pt had been sedated and was not responding to verbal stimuli outside the scanner.  When the pt got in the scanner and the scan started, pt started moving, pulling leg straps off and even popped the heal coil off of his head.  3 times we went in to talk to him to tell him how important it was to hold still and when the scan started, pt would just try to remove himself from the scanner.  ER was called and they requested that he be returned back to the ER.  Pt transferred to stretcher and pt transported back to ER.

## 2015-12-28 NOTE — ED Notes (Signed)
Pt here with complaint of "spasms" in left arm and into neck and head.  Pt denies any pain, or other problems.  Pt has equal grip strength in hands and ambulates without problem.

## 2015-12-28 NOTE — Procedures (Signed)
History: Damon Parrish is an 75 y.o. male patient with seizures. Routine inpatient EEG was performed for further evaluation.   Patient Active Problem List   Diagnosis Date Noted  . 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 Essential hypertension 12/28/2015  . Hyponatremia 12/28/2015  . 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  0.9 %  sodium chloride infusion, , Intravenous, Continuous, Fredia Sorrow, MD, Last Rate: 75 mL/hr at 12/28/15 1221 .  levETIRAcetam (KEPPRA) tablet 1,000 mg, 1,000 mg, Oral, BID, Swan Zayed Fuller Mandril, MD  Current outpatient prescriptions:  .  Ascorbic Acid (VITAMIN C) 1000 MG tablet, Take 1,000 mg by mouth daily., Disp: , Rfl:  .  aspirin 325 MG tablet, Take 325 mg by mouth daily., Disp: , Rfl:  .  atorvastatin (LIPITOR) 40 MG tablet, Take 40 mg by mouth every other day. At bedtime, Disp: , Rfl:  .  beta carotene w/minerals (OCUVITE) tablet, Take 1 tablet by mouth daily., Disp: , Rfl:  .  Brinzolamide-Brimonidine (SIMBRINZA) 1-0.2 % SUSP, Place 1 drop into both eyes every evening. Morning and 8pm, Disp: , Rfl:  .  cholecalciferol (VITAMIN D) 1000 UNITS tablet, Take 1,000 Units by mouth daily., Disp: , Rfl:  .  clopidogrel (PLAVIX) 75 MG tablet, Take 75 mg by mouth daily., Disp: , Rfl:  .  fluticasone (FLONASE) 50 MCG/ACT nasal spray, Place 1 spray into both nostrils daily., Disp: , Rfl:  .  hydrochlorothiazide (HYDRODIURIL) 25 MG tablet, Take 25 mg by mouth daily., Disp: , Rfl:  .  lansoprazole (PREVACID) 15 MG capsule, Take 15 mg by mouth daily. , Disp: , Rfl:  .  Multiple Vitamin (MULTIVITAMIN WITH MINERALS) TABS tablet, Take 1 tablet by mouth daily. Centrum Silver, Disp: , Rfl:  .  Omega-3 Fatty Acids (FISH OIL PO), Take 1 capsule by mouth daily., Disp: , Rfl:  .  TRAVATAN Z 0.004 % SOLN ophthalmic solution, Place 1 drop into both eyes 2 (two) times daily. , Disp: , Rfl:  .  vitamin E 400 UNIT capsule, Take 400 Units by  mouth daily., Disp: , Rfl:    Introduction:  This is a 19 channel routine scalp EEG performed at the bedside with bipolar and monopolar montages arranged in accordance to the international 10/20 system of electrode placement. One channel was dedicated to EKG recording.   Findings:  The background rhythm was normal 10-12 Hz. Intermittent beta activity in the background is seen, commonly associated with benzodiazepine use. No definite evidence of abnormal epileptiform discharges or electrographic seizures were noted during this recording.   Impression:  Unremarkable awake and drowsy routine inpatient EEG. Clinical correlation is recommended .

## 2015-12-28 NOTE — Consult Note (Signed)
Requesting Physician: Dr. Rogene Houston     Reason for consultation:  To evaluate for seizures  HPI:                                                                                                                                         Damon Parrish is an 75 y.o. 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 He takes Plavix at home. No prior history of seizures.  Last night starting at about 7 PM he started having waxing and waning tremulousness involving his left upper extremity and intermittently associated with these tremulous movements, she also had some head jerking movements of the right. No convulsive activity involving the left right upper and lower extent his. He appeared drowsy and altered during these episodes although he is able to answer questions. He was sent to the ER by his regular physicians for further evaluation.   When he initially presented to the ER, he was given Ativan with no benefit. He was noted to have full strength in all 4 extremities with initial presentation in the ER by Dr. Rogene Houston.   After several minutes, then his left upper extremity from looseness have stopped, he was noted to have weakness in his left side which had some waxing and waning quality to it.   Past Medical History: Past Medical History  Diagnosis Date  . Hypertension   . Nonruptured cerebral aneurysm   . GERD (gastroesophageal reflux disease)   . History of kidney stones   . Arthritis     Past Surgical History  Procedure Laterality Date  . Foot surgery Left   . Colonoscopy    . Cataract extraction w/ intraocular lens  implant, bilateral Bilateral   . Radiology with anesthesia N/A 05/04/2015    Procedure: Pipeline Embolization;  Surgeon: Consuella Lose, MD;  Location: [facility];  Service: Radiology;  Laterality: N/A;  . Radiology with anesthesia N/A 05/31/2015    Procedure: Pipeline Embolization;  Surgeon: Consuella Lose, MD;  Location: [place];   Service: Radiology;  Laterality: N/A;    Family History: Family History  Problem Relation Age of Onset  . Lymphoma Mother   . Dementia Mother   . Aneurysm Father     Social History:   reports that he has been smoking Cigarettes.  He has a 30 pack-year smoking history. He has never used smokeless tobacco. He reports that he does not drink alcohol or use illicit drugs.  Allergies:  No Known Allergies   Medications:  Current facility-administered medications:  .  0.9 %  sodium chloride infusion, , Intravenous, Continuous, Fredia Sorrow, MD, Last Rate: 75 mL/hr at 12/28/15 1221 .  fosPHENYtoin (CEREBYX) 1,406 mg PE in sodium chloride 0.9 % 50 mL IVPB, 20 mg PE/kg, Intravenous, Once, Morgen Linebaugh Yahoo, MD .  levETIRAcetam (KEPPRA) 1,500 mg in sodium chloride 0.9 % 100 mL IVPB, 1,500 mg, Intravenous, Once, Will Heinkel Fuller Mandril, MD .  levETIRAcetam (KEPPRA) tablet 1,000 mg, 1,000 mg, Oral, BID, Makar Slatter Fuller Mandril, MD  Current outpatient prescriptions:  .  Ascorbic Acid (VITAMIN C) 1000 MG tablet, Take 1,000 mg by mouth daily., Disp: , Rfl:  .  aspirin 325 MG tablet, Take 325 mg by mouth daily., Disp: , Rfl:  .  atorvastatin (LIPITOR) 40 MG tablet, Take 40 mg by mouth every other day. At bedtime, Disp: , Rfl:  .  beta carotene w/minerals (OCUVITE) tablet, Take 1 tablet by mouth daily., Disp: , Rfl:  .  Brinzolamide-Brimonidine (SIMBRINZA) 1-0.2 % SUSP, Place 1 drop into both eyes every evening. Morning and 8pm, Disp: , Rfl:  .  cholecalciferol (VITAMIN D) 1000 UNITS tablet, Take 1,000 Units by mouth daily., Disp: , Rfl:  .  clopidogrel (PLAVIX) 75 MG tablet, Take 75 mg by mouth daily., Disp: , Rfl:  .  fluticasone (FLONASE) 50 MCG/ACT nasal spray, Place 1 spray into both nostrils daily., Disp: , Rfl:  .  hydrochlorothiazide (HYDRODIURIL) 25 MG  tablet, Take 25 mg by mouth daily., Disp: , Rfl:  .  lansoprazole (PREVACID) 15 MG capsule, Take 15 mg by mouth daily. , Disp: , Rfl:  .  Multiple Vitamin (MULTIVITAMIN WITH MINERALS) TABS tablet, Take 1 tablet by mouth daily. Centrum Silver, Disp: , Rfl:  .  Omega-3 Fatty Acids (FISH OIL PO), Take 1 capsule by mouth daily., Disp: , Rfl:  .  TRAVATAN Z 0.004 % SOLN ophthalmic solution, Place 1 drop into both eyes 2 (two) times daily. , Disp: , Rfl:  .  vitamin E 400 UNIT capsule, Take 400 Units by mouth daily., Disp: , Rfl:    ROS:                                                                                                                                       History obtained from the patient  General ROS: negative for - chills, fatigue, fever, night sweats, weight gain or weight loss Psychological ROS: negative for - behavioral disorder, hallucinations, memory difficulties, mood swings or suicidal ideation Ophthalmic ROS: negative for - blurry vision, double vision, eye pain or loss of vision ENT ROS: negative for - epistaxis, nasal discharge, oral lesions, sore throat, tinnitus or vertigo Allergy and Immunology ROS: negative for - hives or itchy/watery eyes Hematological and Lymphatic ROS: negative for - bleeding problems, bruising or swollen lymph nodes Endocrine ROS: negative for - galactorrhea, hair pattern changes, polydipsia/polyuria or temperature intolerance  Respiratory ROS: negative for - cough, hemoptysis, shortness of breath or wheezing Cardiovascular ROS: negative for - chest pain, dyspnea on exertion, edema or irregular heartbeat Gastrointestinal ROS: negative for - abdominal pain, diarrhea, hematemesis, nausea/vomiting or stool incontinence Genito-Urinary ROS: negative for - dysuria, hematuria, incontinence or urinary frequency/urgency Musculoskeletal ROS: negative for - joint swelling or muscular weakness Neurological ROS: as noted in HPI Dermatological ROS: negative for  rash and skin lesion changes  Neurologic Examination:                                                                                                    Today's Vitals   12/28/15 1015 12/28/15 1049 12/28/15 1130 12/28/15 1221  BP: 169/84 175/90 146/88 174/91  Pulse: 75 78 82 79  Temp:      TempSrc:      Resp:  18  16  Height:      Weight:      SpO2: 98% 100% 100% 100%    Evaluation of higher integrative functions including: Level of alertness: Alert,  Oriented to time, place and person Recent and remote memory - intact   Attention span and concentration  - intact   Speech: fluent, no evidence of dysarthria or aphasia noted.  Test the following cranial nerves: 2-12 grossly intact Motor examination: full 5/5 motor strength in right upper and lower extremities, left lower extremity , mild weakness in the left upper extremity  Examination of sensation : Normal and symmetric sensation to pinprick in all 4 extremities and on face Examination of deep tendon reflexes: 2+, normal and symmetric in all extremities, normal plantars bilaterally Test coordination: Normal finger nose testing, with no evidence of limb appendicular ataxia.  Gait: Deferred   I witnessed a seizure during my evaluation, associated with subtle tremulousness omens involving his left upper extremity with muscle twitching, and he appeared more drowsy and altered compared to at other times although still following commands . Resolved after 2 mg of Ativan was given.       Lab Results: Basic Metabolic Panel:  Recent Labs Lab 12/28/15 0905  NA 129*  K 3.5  CL 94*  CO2 21*  GLUCOSE 108*  BUN 10  CREATININE 0.84  CALCIUM 9.5    Liver Function Tests:  Recent Labs Lab 12/28/15 0905  AST 26  ALT 19  ALKPHOS 99  BILITOT 0.7  PROT 7.3  ALBUMIN 4.2   No results for input(s): LIPASE, AMYLASE in the last 168 hours. No results for input(s): AMMONIA in the last 168 hours.  CBC:  Recent Labs Lab  12/28/15 0905  WBC 7.9  NEUTROABS 4.8  HGB 15.0  HCT 42.8  MCV 94.5  PLT 269    Cardiac Enzymes: No results for input(s): CKTOTAL, CKMB, CKMBINDEX, TROPONINI in the last 168 hours.  Lipid Panel: No results for input(s): CHOL, TRIG, HDL, CHOLHDL, VLDL, LDLCALC in the last 168 hours.  CBG: No results for input(s): GLUCAP in the last 168 hours.  Microbiology: No results found for this or any previous visit.   Imaging: Ct Head Wo Contrast  12/28/2015  CLINICAL DATA:  History of aneurysm.  Uncontrolled tremors. EXAM: CT HEAD WITHOUT CONTRAST TECHNIQUE: Contiguous axial images were obtained from the base of the skull through the vertex without intravenous contrast. COMPARISON:  Brain MRI 01/31/2011 FINDINGS: Skull and Sinuses:No fracture destructive process. Mild scattered mucosal thickening and secretion retention in the paranasal sinuses. 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 Cataract resection on the right. Brain: No evidence of acute infarction, hemorrhage, hydrocephalus, or mass lesion/mass effect. Extra-axial CSF accumulation along the cerebral convexities, slightly greater on the right, stable from 2012. Vessels appeared traversed these CSF, arguing against hygroma. Stable cerebral volume. Normal white matter appearance. 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 IMPRESSION: 1. No acute finding. 2. Treated right ICA aneurysm.  Otherwise, stable from 2012 Electronically Signed   By: Monte Fantasia M.D.   On: 12/28/2015 11:11    Assessment and plan:   Damon Parrish is an 75 y.o. male patient who presented with left upper extremity tremulous convulsive type of activity since last night at 7 PM. I witnessed one of these episodes during my evaluation. 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  Recommend a stat loading dose of IV fosphenytoin 20 mg/Kg.  For long term maintenance therapy, recommend Keppra. We'll start with initial loading dose of 1500 mg of IV Keppra followed by 1 gm Keppra tablets twice a day starting tonight.  He'll be admitted to hospitalist service in a stepdown unit for further seizure monitoring and completion of neurodiagnostic workup. Recommend an EEG as well as brain MRI.  At the time of finalizing this note, EEG was completed which was unremarkable. EEG was done after Ativan and loading fosphenytoin doses were given. Patient did not have any seizures during the EEG.  Awaiting brain MRI.  We'll follow-up

## 2015-12-28 NOTE — ED Notes (Signed)
Pt in c/o L arm, L neck & L shoulder tremor onset yesterday, pt reports having aneurysm 8/16, pt is scheduled to angiogram tomorrow to check stent placement, pt denies blurred vision, no facial droop, no slurred speech, pt A&O x4, pt stopped taking Plavix last wk to prepare for tomorrows procedure

## 2015-12-28 NOTE — ED Provider Notes (Signed)
CSN: [id number]     Arrival date & time 12/28/15  [id number] History   First MD Initiated Contact with Patient 12/28/15 0915     Chief Complaint  Patient presents with  . Tremors     (Consider location/radiation/quality/duration/timing/severity/associated sxs/prior Treatment) The history is provided by the patient and the spouse.  75 year old male status post stenting of an aneurysm on the right side of the brain in August I neurosurgery patient scheduled for follow-up of a routine repeat angiogram tomorrow. Last evening at around 7 PM started with a tremor involving his left side of his neck and left arm and slightly to the left leg. Continued all night never stopped. Patient's never anything like this before. Denies any headache. No nausea no vomiting no fevers. Patient was worried that he would be L do his angiogram tomorrow. Patient without any speech problems.  Past Medical History  Diagnosis Date  . Hypertension   . Nonruptured cerebral aneurysm   . GERD (gastroesophageal reflux disease)   . History of kidney stones   . Arthritis    Past Surgical History  Procedure Laterality Date  . Foot surgery Left   . Colonoscopy    . Cataract extraction w/ intraocular lens  implant, bilateral Bilateral   . Radiology with anesthesia N/A 05/04/2015    Procedure: Pipeline Embolization;  Surgeon: Consuella Lose, MD;  Location: [facility];  Service: Radiology;  Laterality: N/A;  . Radiology with anesthesia N/A 05/31/2015    Procedure: Pipeline Embolization;  Surgeon: Consuella Lose, MD;  Location: [facility];  Service: Radiology;  Laterality: N/A;   Family History  Problem Relation Age of Onset  . Lymphoma Mother   . Dementia Mother   . Aneurysm Father    Social History  Substance Use Topics  . Smoking status: Current Every Day Smoker -- 0.60 packs/day for 50 years    Types: Cigarettes  . Smokeless tobacco: Never Used  . Alcohol Use: No    Review of Systems  Constitutional: Negative for  fever and fatigue.  HENT: Negative for congestion.   Eyes: Negative for visual disturbance.  Respiratory: Negative for shortness of breath.   Cardiovascular: Negative for chest pain.  Gastrointestinal: Negative for abdominal pain.  Genitourinary: Negative for dysuria.  Musculoskeletal: Negative for back pain and neck pain.  Skin: Negative for rash.  Neurological: Positive for tremors. Negative for dizziness, seizures, syncope, facial asymmetry, speech difficulty, weakness, light-headedness, numbness and headaches.  Hematological: Does not bruise/bleed easily.      Allergies  Review of patient's allergies indicates no known allergies.  Home Medications   Prior to Admission medications   Medication Sig Start Date End Date Taking? Authorizing Provider  Ascorbic Acid (VITAMIN C) 1000 MG tablet Take 1,000 mg by mouth daily.   Yes Historical Provider, MD  aspirin 325 MG tablet Take 325 mg by mouth daily.   Yes Historical Provider, MD  atorvastatin (LIPITOR) 40 MG tablet Take 40 mg by mouth every other day. At bedtime   Yes Historical Provider, MD  beta carotene w/minerals (OCUVITE) tablet Take 1 tablet by mouth daily.   Yes Historical Provider, MD  Brinzolamide-Brimonidine [facility]) 1-0.2 % SUSP Place 1 drop into both eyes every evening. Morning and 8pm   Yes Historical Provider, MD  cholecalciferol (VITAMIN D) 1000 UNITS tablet Take 1,000 Units by mouth daily.   Yes Historical Provider, MD  clopidogrel (PLAVIX) 75 MG tablet Take 75 mg by mouth daily.   Yes Historical Provider, MD  fluticasone [facility]enc[facility])  50 MCG/ACT nasal spray Place 1 spray into both nostrils daily.   Yes Historical Provider, MD  hydrochlorothiazide (HYDRODIURIL) 25 MG tablet Take 25 mg by mouth daily. 10/29/14  Yes Historical Provider, MD  lansoprazole (PREVACID) 15 MG capsule Take 15 mg by mouth daily.    Yes Historical Provider, MD  Multiple Vitamin (MULTIVITAMIN WITH MINERALS) TABS tablet Take 1 tablet by mouth daily.  Centrum Silver   Yes Historical Provider, MD  Omega-3 Fatty Acids (FISH OIL PO) Take 1 capsule by mouth daily.   Yes Historical Provider, MD  TRAVATAN Z 0.004 % SOLN ophthalmic solution Place 1 drop into both eyes 2 (two) times daily.  02/27/15  Yes Historical Provider, MD  vitamin E 400 UNIT capsule Take 400 Units by mouth daily.   Yes Historical Provider, MD   BP 170/90 mmHg  Pulse 76  Temp(Src) 98 F (36.7 C) (Oral)  Resp 16  Ht 5\' 8"  (1.727 m)  Wt 70.308 kg  BMI 23.57 kg/m2  SpO2 99% Physical Exam  Constitutional: He is oriented to person, place, and time. He appears well-developed and well-nourished. No distress.  HENT:  Head: Normocephalic and atraumatic.  Mouth/Throat: Oropharynx is clear and moist.  Eyes: Conjunctivae and EOM are normal. Pupils are equal, round, and reactive to light.  Neck: Normal range of motion.  Musculoskeletal: Normal range of motion. He exhibits no edema.  Neurological: He is alert and oriented to person, place, and time. No cranial nerve deficit. He exhibits normal muscle tone. Coordination normal.  Persistent tremor to the left side left arm greater than left leg and involving the left neck area. Patient able to follow commands able to raise left arm equal to the right arm also able to raise the left leg perhaps maybe slightly weaker. Patient able to talk fine. The tremor did not stop with trying to do anything purposeful.  Nursing note and vitals reviewed.   ED Course  Procedures (including critical care time) Labs Review Labs Reviewed  COMPREHENSIVE METABOLIC PANEL - Abnormal; Notable for the following:    Sodium 129 (*)    Chloride 94 (*)    CO2 21 (*)    Glucose, Bld 108 (*)    All other components within normal limits  CBC WITH DIFFERENTIAL/PLATELET   Results for orders placed or performed during the hospital encounter of 12/28/15  Comprehensive metabolic panel  Result Value Ref Range   Sodium 129 (L) 135 - 145 mmol/L   Potassium 3.5 3.5  - 5.1 mmol/L   Chloride 94 (L) 101 - 111 mmol/L   CO2 21 (L) 22 - 32 mmol/L   Glucose, Bld 108 (H) 65 - 99 mg/dL   BUN 10 6 - 20 mg/dL   Creatinine, Ser 0.84 0.61 - 1.24 mg/dL   Calcium 9.5 8.9 - 10.3 mg/dL   Total Protein 7.3 6.5 - 8.1 g/dL   Albumin 4.2 3.5 - 5.0 g/dL   AST 26 15 - 41 U/L   ALT 19 17 - 63 U/L   Alkaline Phosphatase 99 38 - 126 U/L   Total Bilirubin 0.7 0.3 - 1.2 mg/dL   GFR calc non Af Amer >60 >60 mL/min   GFR calc Af Amer >60 >60 mL/min   Anion gap 14 5 - 15  CBC with Differential/Platelet  Result Value Ref Range   WBC 7.9 4.0 - 10.5 K/uL   RBC 4.53 4.22 - 5.81 MIL/uL   Hemoglobin 15.0 13.0 - 17.0 g/dL   HCT 42.8 39.0 -  52.0 %   MCV 94.5 78.0 - 100.0 fL   MCH 33.1 26.0 - 34.0 pg   MCHC 35.0 30.0 - 36.0 g/dL   RDW 12.6 11.5 - 15.5 %   Platelets 269 150 - 400 K/uL   Neutrophils Relative % 61 %   Neutro Abs 4.8 1.7 - 7.7 K/uL   Lymphocytes Relative 29 %   Lymphs Abs 2.3 0.7 - 4.0 K/uL   Monocytes Relative 9 %   Monocytes Absolute 0.7 0.1 - 1.0 K/uL   Eosinophils Relative 1 %   Eosinophils Absolute 0.1 0.0 - 0.7 K/uL   Basophils Relative 0 %   Basophils Absolute 0.0 0.0 - 0.1 K/uL   CRITICAL CARE Performed by: Robyn Nohr Total critical care time: 30 minutes Critical care time was exclusive of separately billable procedures and treating other patients. Critical care was necessary to treat or prevent imminent or life-threatening deterioration. Critical care was time spent personally by me on the following activities: development of treatment plan with patient and/or surrogate as well as nursing, discussions with consultants, evaluation of patient's response to treatment, examination of patient, obtaining history from patient or surrogate, ordering and performing treatments and interventions, ordering and review of laboratory studies, ordering and review of radiographic studies, pulse oximetry and re-evaluation of patient's condition.   Imaging  Review Ct Head Wo Contrast  12/28/2015  CLINICAL DATA:  History of aneurysm.  Uncontrolled tremors. EXAM: CT HEAD WITHOUT CONTRAST TECHNIQUE: Contiguous axial images were obtained from the base of the skull through the vertex without intravenous contrast. COMPARISON:  Brain MRI 01/31/2011 FINDINGS: Skull and Sinuses:No fracture destructive process. Mild scattered mucosal thickening and secretion retention in the paranasal sinuses. 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 Cataract resection on the right. Brain: No evidence of acute infarction, hemorrhage, hydrocephalus, or mass lesion/mass effect. Extra-axial CSF accumulation along the cerebral convexities, slightly greater on the right, stable from 2012. Vessels appeared traversed these CSF, arguing against hygroma. Stable cerebral volume. Normal white matter appearance. 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 IMPRESSION: 1. No acute finding. 2. Treated right ICA aneurysm.  Otherwise, stable from 2012 Electronically Signed   By: Monte Fantasia M.D.   On: 12/28/2015 11:11   I have personally reviewed and evaluated these images and lab results as part of my medical decision-making.   EKG Interpretation None      MDM   Final diagnoses:  Tremors of nervous system  Focal seizures ([place])    Patient presented with tremors to the left side neck left arm and some involving left leg that started at 7 PM last evening. Never had anything like this before. Continued nonstop throughout the night. Had a brief episode earlier today where it stopped or slowed down briefly and restarted. Was initially somewhat suspicious could've been a focal seizure. Patient treated with Ativan without any real change. Head CT was negative. Patient with a history of stenting of an aneurysm from August 2016 due to have follow-up angiogram tomorrow. Patient without headache without any weakness on  initial exam able to move arms and legs fine. The tremor was not stopped by trying to do anything purposeful. Head CT being negative discussed it with his neurosurgeon felt that there was not a direct connection and we both agreed to consult neuro hospitalist for evaluation. [facility] hospitals came to see patient. And symptoms stopped right before he arrived. When they stop patient had significant discomfort and weakness in  his left arm consistent with a postictal kind of phase and probably from the arm being worn out from all the movement. We deemed that it was probably focal seizure planned on a oral Keppra load and then patient started again so patient received, the Keppra load as well as Phospha tone in IV piggyback. Patient was also given an additional 2 mg of Ativan.  Neuro hospitalist recommended MRI brain with and without contrast for further evaluation and hospitalist admission. Currently patient seizure activity as stopped again.   Fredia Sorrow, MD 12/28/15 1339

## 2015-12-28 NOTE — Progress Notes (Signed)
EEG Completed; Results Pending  

## 2015-12-28 NOTE — Progress Notes (Signed)
12/28/2015 Patient came from Emergency room to 2 central at 1815. He is alert to self, not oriented to time and place. Patient have no wounds, 3 small old bruises on right thigh, heels dry. Foam dressing was place on sacrum and on shoulder blade he had a scar area that we place a small foam dressing, no drainage. MRSA and CHG bath was done. He was place on the telemetry monitor central and Elink was made aware patient was in room. [facility] RN.

## 2015-12-29 ENCOUNTER — Inpatient Hospital Stay (HOSPITAL_COMMUNITY): Payer: Medicare Other

## 2015-12-29 ENCOUNTER — Ambulatory Visit (HOSPITAL_COMMUNITY): Admission: RE | Admit: 2015-12-29 | Payer: Medicare Other | Source: Ambulatory Visit

## 2015-12-29 DIAGNOSIS — E871 Hypo-osmolality and hyponatremia: Secondary | ICD-10-CM

## 2015-12-29 DIAGNOSIS — I10 Essential (primary) hypertension: Secondary | ICD-10-CM

## 2015-12-29 LAB — COMPREHENSIVE METABOLIC PANEL
ALBUMIN: 3.4 g/dL — AB (ref 3.5–5.0)
ALT: 18 U/L (ref 17–63)
ANION GAP: 11 (ref 5–15)
AST: 22 U/L (ref 15–41)
Alkaline Phosphatase: 88 U/L (ref 38–126)
BUN: 7 mg/dL (ref 6–20)
CHLORIDE: 99 mmol/L — AB (ref 101–111)
CO2: 22 mmol/L (ref 22–32)
Calcium: 8.8 mg/dL — ABNORMAL LOW (ref 8.9–10.3)
Creatinine, Ser: 0.75 mg/dL (ref 0.61–1.24)
GFR calc non Af Amer: >60 mL/min (ref >60)
GLUCOSE: 97 mg/dL (ref 65–99)
Potassium: 3.2 mmol/L — ABNORMAL LOW (ref 3.5–5.1)
SODIUM: 132 mmol/L — AB (ref 135–145)
Total Bilirubin: 0.7 mg/dL (ref 0.3–1.2)
Total Protein: 6.4 g/dL — ABNORMAL LOW (ref 6.5–8.1)

## 2015-12-29 LAB — CBC
HEMATOCRIT: 39.8 % (ref 39.0–52.0)
HEMOGLOBIN: 13.6 g/dL (ref 13.0–17.0)
MCH: 32.5 pg (ref 26.0–34.0)
MCHC: 34.2 g/dL (ref 30.0–36.0)
MCV: 95 fL (ref 78.0–100.0)
Platelets: 235 10*3/uL (ref 150–400)
RBC: 4.19 MIL/uL — AB (ref 4.22–5.81)
RDW: 12.7 % (ref 11.5–15.5)
WBC: 6.9 10*3/uL (ref 4.0–10.5)

## 2015-12-29 LAB — OSMOLALITY: Osmolality: 271 mOsm/kg — ABNORMAL LOW (ref 275–295)

## 2015-12-29 LAB — SODIUM, URINE, RANDOM: SODIUM UR: 170 mmol/L

## 2015-12-29 LAB — OSMOLALITY, URINE: Osmolality, Ur: 516 mOsm/kg (ref 300–900)

## 2015-12-29 MED ORDER — SODIUM CHLORIDE 0.9 % IV SOLN
1500.0000 mg | Freq: Two times a day (BID) | INTRAVENOUS | Status: DC
  Administered 2015-12-29 – 2016-01-01 (×6): 1500 mg via INTRAVENOUS
  Filled 2015-12-29 (×8): qty 15

## 2015-12-29 MED ORDER — POTASSIUM CHLORIDE IN NACL 20-0.9 MEQ/L-% IV SOLN
INTRAVENOUS | Status: DC
  Administered 2015-12-29 – 2016-01-02 (×4): via INTRAVENOUS
  Filled 2015-12-29 (×8): qty 1000

## 2015-12-29 MED ORDER — LORAZEPAM 2 MG/ML IJ SOLN: 1.0000 mg | INTRAMUSCULAR | Status: DC | PRN

## 2015-12-29 MED ORDER — POTASSIUM CHLORIDE 10 MEQ/100ML IV SOLN
10.0000 meq | INTRAVENOUS | Status: AC
  Administered 2015-12-29 (×3): 10 meq via INTRAVENOUS
  Filled 2015-12-29 (×3): qty 100

## 2015-12-29 MED ORDER — HYDRALAZINE HCL 20 MG/ML IJ SOLN: 5.0000 mg | Freq: Four times a day (QID) | INTRAMUSCULAR | Status: DC | PRN

## 2015-12-29 MED ORDER — LATANOPROST 0.005 % OP SOLN
1.0000 [drp] | Freq: Two times a day (BID) | OPHTHALMIC | Status: DC
  Administered 2015-12-29 – 2016-01-03 (×9): 1 [drp] via OPHTHALMIC
  Filled 2015-12-29 (×2): qty 2.5

## 2015-12-29 MED ORDER — LORAZEPAM 2 MG/ML IJ SOLN
1.0000 mg | Freq: Once | INTRAMUSCULAR | Status: AC | PRN
  Administered 2015-12-30: 1 mg via INTRAVENOUS
  Filled 2015-12-29: qty 1

## 2015-12-29 MED ORDER — POTASSIUM CHLORIDE 10 MEQ/100ML IV SOLN
10.0000 meq | Freq: Once | INTRAVENOUS | Status: AC
  Administered 2015-12-29: 10 meq via INTRAVENOUS
  Filled 2015-12-29: qty 100

## 2015-12-29 NOTE — Evaluation (Signed)
Clinical/Bedside Swallow Evaluation Patient Details  Name: Damon Parrish MRN: [id number] Date of Birth: 01-30-1941  Today's Date: 12/29/2015 Time: SLP Start Time (ACUTE ONLY): 0950 SLP Stop Time (ACUTE ONLY): 1005 SLP Time Calculation (min) (ACUTE ONLY): 15 min  Past Medical History:  Past Medical History  Diagnosis Date  . Hypertension   . Nonruptured cerebral aneurysm   . GERD (gastroesophageal reflux disease)   . Kidney stones   . H/O cerebral aneurysm repair 2016    "put stent in"  . High cholesterol   . Chronic sinus complaints   . Seizures ([place]) 12/27/2015; 12/28/2015  . Arthritis     "generalized" (12/28/2015)   Past Surgical History:  Past Surgical History  Procedure Laterality Date  . Foot fracture surgery Left     "pins, etc. in there"  . Colonoscopy    . Cataract extraction w/ intraocular lens  implant, bilateral Bilateral   . Radiology with anesthesia N/A 05/04/2015    Procedure: Pipeline Embolization;  Surgeon: Consuella Lose, MD;  Location: [facility];  Service: Radiology;  Laterality: N/A;  . Radiology with anesthesia N/A 05/31/2015    Procedure: Pipeline Embolization;  Surgeon: Consuella Lose, MD;  Location: [facility];  Service: Radiology;  Laterality: N/A;  . Fracture surgery     HPI:  Patient is a 75 y.o. male with PMH: HTN and RIGHT ICA aneurysm treated endovascularly Sep 2016 who presents with left sided twitching   Assessment / Plan / Recommendation Clinical Impression  Patient presents with a functional oropharyngeal swallow with timely swallow initiation, good laryngeal elevation and pharyngeal contraction and no overt s/s of aspiration or penetration with boluses of thin liquids via cup and straw, regular solids, puree solids. Patient has been fluctuating with his level of alertness per spouse and RN and so recommendation is for patient to only eat/drink when he is fully awake and alert.    Aspiration Risk  No limitations    Diet Recommendation  Regular;Thin liquid   Liquid Administration via: Cup;Straw Medication Administration: Whole meds with puree Supervision: Staff to assist with self feeding Compensations: Minimize environmental distractions;Slow rate;Small sips/bites Postural Changes: Seated upright at 90 degrees    Other  Recommendations Oral Care Recommendations: Oral care BID   Follow up Recommendations  None    Frequency and Duration            Prognosis        Swallow Study   General Date of Onset: 12/28/15 HPI: Patient is a 75 y.o. male with PMH: HTN and RIGHT ICA aneurysm treated endovascularly Sep 2016 who presents with left sided twitching Type of Study: Bedside Swallow Evaluation Previous Swallow Assessment: N/A Diet Prior to this Study: NPO Temperature Spikes Noted: No Respiratory Status: Room air History of Recent Intubation: No Behavior/Cognition: Cooperative;Pleasant mood;Lethargic/Drowsy Oral Cavity Assessment: Within Functional Limits Oral Care Completed by SLP: No Oral Cavity - Dentition: Adequate natural dentition Vision: Functional for self-feeding Self-Feeding Abilities: Needs assist (patient is left handed and per wife, is still weak) Patient Positioning: Upright in bed;Postural control adequate for testing Baseline Vocal Quality: Normal;Hoarse (mildly hoarse) Volitional Cough: Strong Volitional Swallow: Able to elicit    Oral/Motor/Sensory Function Overall Oral Motor/Sensory Function: Within functional limits   Ice Chips     Thin Liquid Thin Liquid: Within functional limits Presentation: Cup;Straw Other Comments: No overt s/s aspiration or penetration    Nectar Thick Nectar Thick Liquid: Not tested   Honey Thick Honey Thick Liquid: Not tested   Puree  Puree: Within functional limits Presentation: Spoon   Solid   GO   Solid: Within functional limits        [name] 12/29/2015,3:25 PM    [name], MA, CCC-SLP 12/29/2015 3:25 PM

## 2015-12-29 NOTE — Progress Notes (Signed)
[facility] PROGRESS NOTE  Damon Parrish [id number] DOB: June 03, 1941 DOA: 12/28/2015  PCP: Tobe Sos, MD  Brief HPI: 75 year old Caucasian male with a past medical history of hypertension, right ICA aneurysm treated endovascularly in 2016, presented with left-sided twitching and left on weakness. Patient has been on Plavix. However, he stopped it about a week ago as the plan was for him to undergo angiography of the carotid system sometime this week. Patient was hospitalized for further management.  Past medical history:  Past Medical History  Diagnosis Date  . Hypertension   . Nonruptured cerebral aneurysm   . GERD (gastroesophageal reflux disease)   . Kidney stones   . H/O cerebral aneurysm repair 2016    "put stent in"  . High cholesterol   . Chronic sinus complaints   . Seizures ([place]) 12/27/2015; 12/28/2015  . Arthritis     "generalized" (12/28/2015)    Consultants: Neurology  Procedures: None  Antibiotics: None  Subjective: Patient somewhat confused and slightly agitated. His wife is at the bedside. He does follow certain commands. Denies any pain. Not very communicative.  Objective: Vital Signs  Filed Vitals:   12/29/15 0500 12/29/15 0700 12/29/15 0750 12/29/15 0800  BP: 132/88 130/118 150/97 145/94  Pulse: 64 72 71 65  Temp:   97.8 F (36.6 C)   TempSrc:   Axillary   Resp: 15 20 16 18   Height:      Weight:      SpO2: 99% 100% 100% 95%    Intake/Output Summary (Last 24 hours) at 12/29/15 1019 Last data filed at 12/28/15 2305  Gross per 24 hour  Intake   1000 ml  Output      1 ml  Net    999 ml   Filed Weights   12/28/15 0859 12/28/15 1825  Weight: 70.308 kg (155 lb) 68.8 kg (151 lb 10.8 oz)    General appearance: appears stated age, distracted and no distress Resp: Diminished air entry at the bases. No crackles or wheezing. No rhonchi. Cardio: regular rate and rhythm, S1, S2 normal, no murmur, click, rub or gallop GI: soft,  non-tender; bowel sounds normal; no masses,  no organomegaly Extremities: extremities normal, atraumatic, no cyanosis or edema Neurologic: Easily arousable. Very distracted. Does not answer questions. No obvious facial asymmetry. Does have weakness in the left upper extremity. Equal strength bilateral lower extremities.  Lab Results:  Basic Metabolic Panel:  Recent Labs Lab 12/28/15 0905 12/29/15 0330  NA 129* 132*  K 3.5 3.2*  CL 94* 99*  CO2 21* 22  GLUCOSE 108* 97  BUN 10 7  CREATININE 0.84 0.75  CALCIUM 9.5 8.8*   Liver Function Tests:  Recent Labs Lab 12/28/15 0905 12/29/15 0330  AST 26 22  ALT 19 18  ALKPHOS 99 88  BILITOT 0.7 0.7  PROT 7.3 6.4*  ALBUMIN 4.2 3.4*   CBC:  Recent Labs Lab 12/28/15 0905 12/29/15 0330  WBC 7.9 6.9  NEUTROABS 4.8  --   HGB 15.0 13.6  HCT 42.8 39.8  MCV 94.5 95.0  PLT 269 235    Recent Results (from the past 240 hour(s))  MRSA PCR Screening     Status: None   Collection Time: 12/28/15  6:37 PM  Result Value Ref Range Status   MRSA by PCR NEGATIVE NEGATIVE Final    Comment:        The GeneXpert MRSA Assay (FDA approved for NASAL specimens only), is one component of a comprehensive MRSA  colonization surveillance program. It is not intended to diagnose MRSA infection nor to guide or monitor treatment for MRSA infections.       Studies/Results: Ct Head Wo Contrast  12/28/2015  CLINICAL DATA:  History of aneurysm.  Uncontrolled tremors. EXAM: CT HEAD WITHOUT CONTRAST TECHNIQUE: Contiguous axial images were obtained from the base of the skull through the vertex without intravenous contrast. COMPARISON:  Brain MRI 01/31/2011 FINDINGS: Skull and Sinuses:No fracture destructive process. Mild scattered mucosal thickening and secretion retention in the paranasal sinuses. 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 Cataract resection on the right. Brain: No evidence of  acute infarction, hemorrhage, hydrocephalus, or mass lesion/mass effect. Extra-axial CSF accumulation along the cerebral convexities, slightly greater on the right, stable from 2012. Vessels appeared traversed these CSF, arguing against hygroma. Stable cerebral volume. Normal white matter appearance. 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 IMPRESSION: 1. No acute finding. 2. Treated right ICA aneurysm.  Otherwise, stable from 2012 Electronically Signed   By: Monte Fantasia M.D.   On: 12/28/2015 11:11    Medications:  Scheduled: . aspirin  325 mg Oral Daily  . atorvastatin  40 mg Oral QODAY  . clopidogrel  75 mg Oral Daily  . enoxaparin (LOVENOX) injection  40 mg Subcutaneous Q24H  . levETIRAcetam  1,000 mg Intravenous Q12H  . pantoprazole  20 mg Oral Daily  . potassium chloride  10 mEq Intravenous Q1 Hr x 4  . sodium chloride flush  3 mL Intravenous Q12H   Continuous: . 0.9 % NaCl with KCl 20 mEq / L     [id number] **OR** acetaminophen, LORazepam, ondansetron **OR** ondansetron (ZOFRAN) IV, senna-docusate  Assessment/Plan:  Principal Problem:   Focal seizure ([place]) Active Problems:   Essential hypertension   Hyponatremia    Left arm twitching with weakness Differential diagnosis broad including seizure activity. EEG did not show any epileptiform activity. Neurology is following. Patient was started on Keppra. Stroke is another possibility. MRI was ordered, however, cannot be done despite 2 attempts due to agitation. We will need to discuss this with neurology.  History of right ICA aneurysm status post stent placement Stable. We will notify his neurosurgeon. He was apparently supposed to undergo an angiogram this week. He was started back on his Plavix yesterday.  Essential Hypertension Monitor blood pressures closely. Hold his diuretic due to hyponatremia and hypokalemia.  Hyponatremia and hypokalemia Likely due to diuretics. Could also be mildly  dehydrated. Continue IV fluids for now. Replace potassium. Sodium level has improved.   DVT Prophylaxis: Lovenox    Code Status: Full code  Family Communication: Discussed with his wife  Disposition Plan: Continue stepdown setting for now. Await neurology input regarding reattempting MRI under conscious sedation versus repeating CT scan.    LOS: 1 day   [facility] Pager [phone] 12/29/2015, 10:19 AM  If 7PM-7AM, please contact night-coverage at [email or web address], password [facility]

## 2015-12-29 NOTE — Progress Notes (Signed)
Interval History:                                                                                                                      Damon Parrish is an 75 y.o. male patient admitted with partial seizures. Has a history of right ICA aneurysm stenting. Attempted MRI but patient could not tolerate.  He continues to have some left upper extremity weakness. His wife reported that intermittently his been having few seconds of twitching in his left upper extremity similar to the focal partial seizures he had a straight involving the left side. He is sedated with his current medications but easily arousable to verbal commands.  Otherwise, no other new neurological symptoms.   Past Medical History: Past Medical History  Diagnosis Date  . Hypertension   . Nonruptured cerebral aneurysm   . GERD (gastroesophageal reflux disease)   . Kidney stones   . H/O cerebral aneurysm repair 2016    "put stent in"  . High cholesterol   . Chronic sinus complaints   . Seizures ([place]) 12/27/2015; 12/28/2015  . Arthritis     "generalized" (12/28/2015)    Past Surgical History  Procedure Laterality Date  . Foot fracture surgery Left     "pins, etc. in there"  . Colonoscopy    . Cataract extraction w/ intraocular lens  implant, bilateral Bilateral   . Radiology with anesthesia N/A 05/04/2015    Procedure: Pipeline Embolization;  Surgeon: Consuella Lose, MD;  Location: [facility];  Service: Radiology;  Laterality: N/A;  . Radiology with anesthesia N/A 05/31/2015    Procedure: Pipeline Embolization;  Surgeon: Consuella Lose, MD;  Location: [facility];  Service: Radiology;  Laterality: N/A;  . Fracture surgery      Family History: Family History  Problem Relation Age of Onset  . Lymphoma Mother   . Dementia Mother   . Aneurysm Father     Social History:   reports that he has been smoking Cigarettes.  He has a 50 pack-year smoking history. He has never used smokeless tobacco. He reports that he does not drink  alcohol or use illicit drugs.  Allergies:  No Known Allergies   Medications:                                                                                                                         Current facility-administered medications:  .  0.9 % NaCl with KCl 20 mEq/ L  infusion, , Intravenous,  Continuous, Bonnielee Haff, MD, Last Rate: 100 mL/hr at 12/29/15 1740 .  acetaminophen (TYLENOL) tablet 650 mg, 650 mg, Oral, Q6H PRN **OR** acetaminophen (TYLENOL) suppository 650 mg, 650 mg, Rectal, Q6H PRN, Edwin Dada, MD .  aspirin tablet 325 mg, 325 mg, Oral, Daily, Edwin Dada, MD, 325 mg at 12/29/15 1018 .  atorvastatin (LIPITOR) tablet 40 mg, 40 mg, Oral, QODAY, Edwin Dada, MD, 40 mg at 12/29/15 1018 .  clopidogrel (PLAVIX) tablet 75 mg, 75 mg, Oral, Daily, Edwin Dada, MD, 75 mg at 12/29/15 1018 .  enoxaparin (LOVENOX) injection 40 mg, 40 mg, Subcutaneous, Q24H, Edwin Dada, MD, 40 mg at 12/28/15 2050 .  hydrALAZINE (APRESOLINE) injection 5 mg, 5 mg, Intravenous, Q6H PRN, Bonnielee Haff, MD .  latanoprost (XALATAN) 0.005 % ophthalmic solution 1 drop, 1 drop, Both Eyes, BID, Bonnielee Haff, MD .  levETIRAcetam (KEPPRA) 1,000 mg in sodium chloride 0.9 % 100 mL IVPB, 1,000 mg, Intravenous, Q12H, Gardiner Barefoot, NP, 1,000 mg at 12/29/15 1014 .  LORazepam (ATIVAN) injection 1 mg, 1 mg, Intravenous, Once PRN, Bonnielee Haff, MD .  LORazepam (ATIVAN) injection 1 mg, 1 mg, Intravenous, Q4H PRN, Bonnielee Haff, MD .  ondansetron (ZOFRAN) tablet 4 mg, 4 mg, Oral, Q6H PRN **OR** ondansetron (ZOFRAN) injection 4 mg, 4 mg, Intravenous, Q6H PRN, Edwin Dada, MD .  pantoprazole (PROTONIX) EC tablet 20 mg, 20 mg, Oral, Daily, Edwin Dada, MD, 20 mg at 12/29/15 1056 .  senna-docusate (Senokot-S) tablet 1 tablet, 1 tablet, Oral, QHS PRN, Edwin Dada, MD .  sodium chloride flush (NS) 0.9 % injection 3 mL, 3 mL,  Intravenous, Q12H, Edwin Dada, MD, 3 mL at 12/29/15 1000   Neurologic Examination:                                                                                                     Today's Vitals   12/29/15 1600 12/29/15 1634 12/29/15 1800 12/29/15 1914  BP: 165/105 166/97 148/80 154/83  Pulse: 68 73 71 70  Temp:  97.2 F (36.2 C)  96.9 F (36.1 C)  TempSrc:  Oral  Axillary  Resp: 14 11 15 16   Height:      Weight:      SpO2: 98% 100% 99% 99%    Evaluation of higher integrative functions including: Level of alertness: Drowsy, easily arousable with verbal commands  Oriented to time, place and person Speech: fluent, no evidence of dysarthria or aphasia noted.  Test the following cranial nerves: 2-12 grossly intact Motor examination: Mild 4/5 weakness in the left upper extremity, otherwise full 5/5 motor strength in other 3 extremities Examination of sensation : Normal and symmetric sensation to pinprick in all 4 extremities and on face Examination of deep tendon reflexes: 2+, normal and symmetric in all extremities, normal plantars bilaterally Test coordination: Normal finger nose testing, with no evidence of limb appendicular ataxia or abnormal involuntary movements or tremors noted.     Lab Results: Basic Metabolic Panel:  Recent Labs Lab 12/28/15 0905 12/29/15 0330  NA 129*  132*  K 3.5 3.2*  CL 94* 99*  CO2 21* 22  GLUCOSE 108* 97  BUN 10 7  CREATININE 0.84 0.75  CALCIUM 9.5 8.8*    Liver Function Tests:  Recent Labs Lab 12/28/15 0905 12/29/15 0330  AST 26 22  ALT 19 18  ALKPHOS 99 88  BILITOT 0.7 0.7  PROT 7.3 6.4*  ALBUMIN 4.2 3.4*   No results for input(s): LIPASE, AMYLASE in the last 168 hours. No results for input(s): AMMONIA in the last 168 hours.  CBC:  Recent Labs Lab 12/28/15 0905 12/29/15 0330  WBC 7.9 6.9  NEUTROABS 4.8  --   HGB 15.0 13.6  HCT 42.8 39.8  MCV 94.5 95.0  PLT 269 235    Cardiac Enzymes: No results  for input(s): CKTOTAL, CKMB, CKMBINDEX, TROPONINI in the last 168 hours.  Lipid Panel: No results for input(s): CHOL, TRIG, HDL, CHOLHDL, VLDL, LDLCALC in the last 168 hours.  CBG: No results for input(s): GLUCAP in the last 168 hours.  Microbiology: Results for orders placed or performed during the hospital encounter of 12/28/15  MRSA PCR Screening     Status: None   Collection Time: 12/28/15  6:37 PM  Result Value Ref Range Status   MRSA by PCR NEGATIVE NEGATIVE Final    Comment:        The GeneXpert MRSA Assay (FDA approved for NASAL specimens only), is one component of a comprehensive MRSA colonization surveillance program. It is not intended to diagnose MRSA infection nor to guide or monitor treatment for MRSA infections.     Imaging: Ct Head Wo Contrast  12/28/2015  CLINICAL DATA:  History of aneurysm.  Uncontrolled tremors. EXAM: CT HEAD WITHOUT CONTRAST TECHNIQUE: Contiguous axial images were obtained from the base of the skull through the vertex without intravenous contrast. COMPARISON:  Brain MRI 01/31/2011 FINDINGS: Skull and Sinuses:No fracture destructive process. Mild scattered mucosal thickening and secretion retention in the paranasal sinuses. 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 Cataract resection on the right. Brain: No evidence of acute infarction, hemorrhage, hydrocephalus, or mass lesion/mass effect. Extra-axial CSF accumulation along the cerebral convexities, slightly greater on the right, stable from 2012. Vessels appeared traversed these CSF, arguing against hygroma. Stable cerebral volume. Normal white matter appearance. 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 IMPRESSION: 1. No acute finding. 2. Treated right ICA aneurysm.  Otherwise, stable from 2012 Electronically Signed   By: Monte Fantasia M.D.   On: 12/28/2015 11:11    Assessment and plan:   Damon Parrish is an 75  y.o. male patient with left-sided simple partial seizures noted history as described consultation note. Is currently treated with Keppra thousand grams twice a day. His wife reported having some intermittent twitching in the left side which were brief and short lasting. He continues to have left upper extremity weakness which is believed to be postictal from seizures. As the weakness is been persistent and the likely etiology would include either an intracranial pathology in the right hemisphere versus continued brief focal seizures causing persistent postictal weakness. We'll increase Keppra dose to 1500 mg twice a day. MRI was attempted twice but patient did not cooperate. Encourage the patient completed the MRI study. This time I wouldn't recommend his wife to stay with the patient while he is in the scanner to help him stay calm. If he is unable to cooperate, the next option would be a sedated MRI with the  help of anesthesiology.  Discussed with primary hospitalist, Dr. Maryland Pink.  We'll follow-up

## 2015-12-29 NOTE — Progress Notes (Signed)
12/29/2015 patient blood pressure was 166/97 Dr Maryland Pink was made aware. Rico Sheehan RN

## 2015-12-29 NOTE — Progress Notes (Signed)
ATTEMPTED PT AT 2am  WITH MEDS AND PT WAS STILL TO CONFUSED AND MOVING ALL OVER THE PLACE

## 2015-12-30 ENCOUNTER — Inpatient Hospital Stay (HOSPITAL_COMMUNITY): Payer: Medicare Other

## 2015-12-30 DIAGNOSIS — I63511 Cerebral infarction due to unspecified occlusion or stenosis of right middle cerebral artery: Secondary | ICD-10-CM | POA: Insufficient documentation

## 2015-12-30 DIAGNOSIS — I639 Cerebral infarction, unspecified: Secondary | ICD-10-CM | POA: Diagnosis present

## 2015-12-30 LAB — CBC
HEMATOCRIT: 41.1 % (ref 39.0–52.0)
HEMOGLOBIN: 14.1 g/dL (ref 13.0–17.0)
MCH: 32.7 pg (ref 26.0–34.0)
MCHC: 34.3 g/dL (ref 30.0–36.0)
MCV: 95.4 fL (ref 78.0–100.0)
PLATELETS: 247 10*3/uL (ref 150–400)
RBC: 4.31 MIL/uL (ref 4.22–5.81)
RDW: 12.6 % (ref 11.5–15.5)
WBC: 7 10*3/uL (ref 4.0–10.5)

## 2015-12-30 LAB — BASIC METABOLIC PANEL
ANION GAP: 11 (ref 5–15)
BUN: 7 mg/dL (ref 6–20)
CO2: 20 mmol/L — AB (ref 22–32)
Calcium: 8.8 mg/dL — ABNORMAL LOW (ref 8.9–10.3)
Chloride: 100 mmol/L — ABNORMAL LOW (ref 101–111)
Creatinine, Ser: 0.67 mg/dL (ref 0.61–1.24)
GFR calc Af Amer: >60 mL/min (ref >60)
GFR calc non Af Amer: >60 mL/min (ref >60)
Glucose, Bld: 85 mg/dL (ref 65–99)
POTASSIUM: 3.8 mmol/L (ref 3.5–5.1)
Sodium: 131 mmol/L — ABNORMAL LOW (ref 135–145)

## 2015-12-30 MED ORDER — IOPAMIDOL (ISOVUE-370) INJECTION 76%
INTRAVENOUS | Status: AC
  Administered 2015-12-30: 50 mL
  Filled 2015-12-30: qty 50

## 2015-12-30 MED ORDER — GADOBENATE DIMEGLUMINE 529 MG/ML IV SOLN
20.0000 mL | Freq: Once | INTRAVENOUS | Status: AC | PRN
  Administered 2015-12-30: 15 mL via INTRAVENOUS

## 2015-12-30 MED ORDER — STROKE: EARLY STAGES OF RECOVERY BOOK
Freq: Once | Status: AC
  Administered 2015-12-30: 20:00:00
  Filled 2015-12-30: qty 1

## 2015-12-30 MED ORDER — HYDRALAZINE HCL 20 MG/ML IJ SOLN: 10.0000 mg | Freq: Four times a day (QID) | INTRAMUSCULAR | Status: DC | PRN

## 2015-12-30 NOTE — Progress Notes (Addendum)
STROKE TEAM PROGRESS NOTE   HISTORY OF PRESENT ILLNESS Damon Parrish is an 75 y.o. 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 He takes Plavix at home. No prior history of seizures.  Last night starting at about 7 PM he started having waxing and waning tremulousness involving his left upper extremity and intermittently associated with these tremulous movements, she also had some head jerking movements of the right. No convulsive activity involving the left right upper and lower extent his. He appeared drowsy and altered during these episodes although he is able to answer questions. He was sent to the ER by his regular physicians for further evaluation.   When he initially presented to the ER, he was given Ativan with no benefit. He was noted to have full strength in all 4 extremities with initial presentation in the ER by Dr. Rogene Houston.   After several minutes, then his left upper extremity from looseness have stopped, he was noted to have weakness in his left side which had some waxing and waning quality to it.    SUBJECTIVE (INTERVAL HISTORY) Wife at bedside. Feels arm is getting better. No more jerking of the left extremity. The seizure medications are making him drowsy.   OBJECTIVE Temp:  [96.9 F (36.1 C)-97.6 F (36.4 C)] 97.6 F (36.4 C) (04/08 1106) Pulse Rate:  [66-73] 68 (04/08 1106) Cardiac Rhythm:  [-] Normal sinus rhythm (04/08 1113) Resp:  [11-21] 19 (04/08 1106) BP: (140-171)/(71-106) 166/98 mmHg (04/08 1106) SpO2:  [97 %-100 %] 99 % (04/08 1106)  CBC:  Recent Labs Lab 12/28/15 0905 12/29/15 0330 12/30/15 0555  WBC 7.9 6.9 7.0  NEUTROABS 4.8  --   --   HGB 15.0 13.6 14.1  HCT 42.8 39.8 41.1  MCV 94.5 95.0 95.4  PLT 269 235 [id number]    Basic Metabolic Panel:  Recent Labs Lab 12/29/15 0330 12/30/15 0555  NA 132* 131*  K 3.2* 3.8  CL 99* 100*  CO2 22 20*  GLUCOSE 97 85  BUN 7 7  CREATININE 0.75 0.67  CALCIUM 8.8* 8.8*     Lipid Panel: No results found for: CHOL, TRIG, HDL, CHOLHDL, VLDL, LDLCALC HgbA1c: No results found for: HGBA1C Urine Drug Screen: No results found for: LABOPIA, COCAINSCRNUR, LABBENZ, AMPHETMU, THCU, LABBARB    IMAGING  [name] Wo Contrast 12/30/2015   Patchy acute ischemia 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 Involutional changes and minimal chronic small vessel ischemic disease. 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    CTA of head and neck - pending    PHYSICAL EXAM   PHYSICAL EXAM Physical exam: Exam: Gen: NAD Eyes: anicteric sclerae, moist conjunctivae                    CV: no MRG, no carotid bruits, no peripheral edema Mental Status: Sleepy, follows commands, says it is April 1917, knows he is in [facility].   Neuro: Detailed Neurologic Exam  Speech:  Mild dysarthria (may be from medication effects of AED)  Cranial Nerves:    The pupils are equal, round, and reactive to light.. Attempted, Fundi not visualized.  EOMI. No gaze preference. Visual fields full. Face symmetric, Tongue midline. Hearing intact to voice. Shoulder shrug intact  Motor Observation:    no involuntary movements noted. Tone appears normal.  Strength: Left arm drift 3/5. Left leg milder weakness,  can keep left leg anti-gravity without drift. Right side strength intact.      Sensation:  Intact to LT, does not extinguish to dbl simultaneous stim  Plantars equivocal .   ASSESSMENT/PLAN Damon Parrish is a 75 y.o. 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 He did not receive IV t-PA due to chronic subdural hematomas.  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  MRA - not performed  CTA of head and neck - pending  Carotid Doppler - refer to CTA of neck  2D Echo - pending  LDL - pending  HgbA1c pending   EEG - unremarkable  VTE prophylaxis - Lovenox  Diet Heart Room service appropriate?: Yes; Fluid consistency:: Thin  aspirin 325 mg daily and clopidogrel 75 mg daily prior to admission, now on aspirin 325 mg daily and clopidogrel 75 mg daily   Patient counseled to be compliant with his antithrombotic medications  Ongoing aggressive stroke risk factor management  Therapy recommendations: Pending  Disposition:  Pending  Hypertension  Stable - mildly elevated  Permissive hypertension (OK if < 220/120) but gradually normalize in 5-7 days  Hyperlipidemia  Home meds:  Lipitor 40 mg daily resumed in hospital  LDL pending, goal < 70    Other Stroke Risk Factors  Advanced age  Cigarette smoker, advised to stop smoking    Other Active Problems  Status post stenting of a right carotid artery aneurysm August / September 2016  Seizure-like activity - EEG unremarkable - now on Keppra 1500 mg every 12 hours and Ativan as needed.  Hyponatremia - diuretic prior to admission - now on hold  Hospital day # [place] PA-C [facility] Pager [phone] 12/30/2015, 11:36 AM   Personally examined patient and images, and have participated in and made any corrections needed to history, physical, neuro exam,assessment and plan as stated above.  I have personally obtained the history, evaluated lab date, reviewed imaging studies and agree with radiology interpretations.   76 year old male who presented for partial motor seizure of the left arm and leg subsequent MRI showed patchy frontoparietal MCA territory infarcts of unknown etiology. Of note he had a stent of the right carotid artery aneurysm in August  2016 and was due for repeat interventional cerebral angiography with Dr. Kathyrn Sheriff now put on hold due to seizure. Will continue with stroke workup.   Sarina Ill, MD Stroke Neurology [phone] [facility]    To contact Stroke Continuity provider, please refer to [email or web address] After hours, contact General Neurology

## 2015-12-30 NOTE — Progress Notes (Signed)
[facility] PROGRESS NOTE  Damon Parrish [id number] DOB: [birth year] DOA: 12/28/2015  PCP: Tobe Sos, MD  Brief HPI: 75 year old Caucasian male with a past medical history of hypertension, right ICA aneurysm treated endovascularly in 2016, presented with left-sided twitching and left on weakness. Patient has been on Plavix. However, he stopped it about a week ago as the plan was for him to undergo angiography of the carotid system sometime this week. Patient was hospitalized for further management. Patient was seen by neurology. Patient underwent MRI which reveals stroke.  Past medical history:  Past Medical History  Diagnosis Date  . Hypertension   . Nonruptured cerebral aneurysm   . GERD (gastroesophageal reflux disease)   . Kidney stones   . H/O cerebral aneurysm repair 2016    "put stent in"  . High cholesterol   . Chronic sinus complaints   . Seizures ([place]) 12/27/2015; 12/28/2015  . Arthritis     "generalized" (12/28/2015)    Consultants: Neurology  Procedures:   Transthoracic echocardiogram is pending   Antibiotics: None  Subjective: Patient is more awake and alert this morning. His wife is at bedside. She thinks that he is looking much better. No twitching movements of the left upper extremity. Still has weakness in the left arm.   Objective: Vital Signs  Filed Vitals:   12/30/15 0751 12/30/15 0800 12/30/15 0900 12/30/15 0943  BP: 166/98 152/98 171/106 165/95  Pulse: 72 71 73 67  Temp: 97.2 F (36.2 C)     TempSrc: Oral     Resp: 13 19 12 21   Height:      Weight:      SpO2: 97% 98% 99% 99%    Intake/Output Summary (Last 24 hours) at 12/30/15 1022 Last data filed at 12/30/15 0914  Gross per 24 hour  Intake 1103.33 ml  Output   1475 ml  Net -371.67 ml   Filed Weights   12/28/15 0859 12/28/15 1825  Weight: 70.308 kg (155 lb) 68.8 kg (151 lb 10.8 oz)    General appearance: appears stated age, distracted and no distress Resp: Diminished  air entry at the bases. No crackles or wheezing. No rhonchi. Cardio: regular rate and rhythm, S1, S2 normal, no murmur, click, rub or gallop GI: soft, non-tender; bowel sounds normal; no masses,  no organomegaly Neurologic: Awake and alert. No obvious facial asymmetry. 3-4 out of 5 strength in the left upper extremity. Normal strength in right upper extremity. Equal strength bilateral lower extremities.  Lab Results:  Basic Metabolic Panel:  Recent Labs Lab 12/28/15 0905 12/29/15 0330 12/30/15 0555  NA 129* 132* 131*  K 3.5 3.2* 3.8  CL 94* 99* 100*  CO2 21* 22 20*  GLUCOSE 108* 97 85  BUN 10 7 7   CREATININE 0.84 0.75 0.67  CALCIUM 9.5 8.8* 8.8*   Liver Function Tests:  Recent Labs Lab 12/28/15 0905 12/29/15 0330  AST 26 22  ALT 19 18  ALKPHOS 99 88  BILITOT 0.7 0.7  PROT 7.3 6.4*  ALBUMIN 4.2 3.4*   CBC:  Recent Labs Lab 12/28/15 0905 12/29/15 0330 12/30/15 0555  WBC 7.9 6.9 7.0  NEUTROABS 4.8  --   --   HGB 15.0 13.6 14.1  HCT 42.8 39.8 41.1  MCV 94.5 95.0 95.4  PLT 269 235 247    Recent Results (from the past 240 hour(s))  MRSA PCR Screening     Status: None   Collection Time: 12/28/15  6:37 PM  Result Value Ref  Range Status   MRSA by PCR NEGATIVE NEGATIVE Final    Comment:        The GeneXpert MRSA Assay (FDA approved for NASAL specimens only), is one component of a comprehensive MRSA colonization surveillance program. It is not intended to diagnose MRSA infection nor to guide or monitor treatment for MRSA infections.       Studies/Results: Ct Head Wo Contrast  12/28/2015  CLINICAL DATA:  History of aneurysm.  Uncontrolled tremors. EXAM: CT HEAD WITHOUT CONTRAST TECHNIQUE: Contiguous axial images were obtained from the base of the skull through the vertex without intravenous contrast. COMPARISON:  Brain MRI 01/31/2011 FINDINGS: Skull and Sinuses:No fracture destructive process. Mild scattered mucosal thickening and secretion retention in the  paranasal sinuses. 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 Cataract resection on the right. Brain: No evidence of acute infarction, hemorrhage, hydrocephalus, or mass lesion/mass effect. Extra-axial CSF accumulation along the cerebral convexities, slightly greater on the right, stable from 2012. Vessels appeared traversed these CSF, arguing against hygroma. Stable cerebral volume. Normal white matter appearance. 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 IMPRESSION: 1. No acute finding. 2. Treated right ICA aneurysm.  Otherwise, stable from 2012 Electronically Signed   By: Monte Fantasia M.D.   On: 12/28/2015 11:11   [name] [id number] Contrast  12/30/2015  CLINICAL DATA:  Intermittent seizures, persistent LEFT upper extremity weakness may be postictal. History of hypertension, cerebral aneurysm, seizures. EXAM: MRI HEAD WITHOUT AND WITH CONTRAST TECHNIQUE: Multiplanar, multiecho pulse sequences of the brain and surrounding structures were obtained without and with intravenous contrast. CONTRAST:  31mL MULTIHANCE GADOBENATE DIMEGLUMINE 529 MG/ML IV SOLN COMPARISON:  CT head December 28, 2015 and MRI head Jan 31, 2011 FINDINGS: Multiple sequences are moderately motion degraded. Patchy subcentimeter reduced diffusion RIGHT frontal and parietal lobes, in addition to linear precentral gyrus reduced diffusion. Due to small size and motion, limited assessment by ADC values though identifiable lesion does demonstrate low ADC values. Susceptibility artifact RIGHT carotid siphon associated with treated aneurysm and stent. No susceptibility artifact to suggest hemorrhage. Ventricles and sulci are normal for patient's age. No midline shift or mass effect. No definite masses. Minimal white matter changes compatible chronic small vessel ischemic disease are less than expected for age. No definite abnormal intracranial enhancement though motion degrades  sensitivity for subtle potential enhancement. Stable LEFT and slightly larger 9 mm RIGHT frontal parietal T2 bright extra-axial fluid spaces. No extra-axial masses nor leptomeningeal enhancement. Normal major intracranial vascular flow voids seen at the skull base. Status post RIGHT ocular lens implant with shunt in the anterior chamber. No suspicious calvarial bone marrow signal. No abnormal sellar expansion. Craniocervical junction maintained. Mild paranasal sinus mucosal thickening with small LEFT frontal mucosal retention cyst. Small RIGHT mastoid effusion. IMPRESSION: Patchy acute ischemia 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 Involutional changes and minimal chronic small vessel ischemic disease. 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 Electronically Signed   By: Elon Alas M.D.   On: 12/30/2015 03:16    Medications:  Scheduled: . aspirin  325 mg Oral Daily  . atorvastatin  40 mg Oral QODAY  . clopidogrel  75 mg Oral Daily  . enoxaparin (LOVENOX) injection  40 mg Subcutaneous Q24H  . latanoprost  1 drop Both Eyes BID  . levETIRAcetam  1,500 mg Intravenous Q12H  .  pantoprazole  20 mg Oral Daily  . sodium chloride flush  3 mL Intravenous Q12H   Continuous: . 0.9 % NaCl with KCl 20 mEq / L 100 mL/hr at 12/30/15 [id number]   [id number] **OR** acetaminophen, hydrALAZINE, LORazepam, ondansetron **OR** ondansetron (ZOFRAN) IV, senna-docusate  Assessment/Plan:  Principal Problem:   Focal seizure ([place]) Active Problems:   Essential hypertension   Hyponatremia   Acute CVA (cerebrovascular accident) ([place])    Acute stroke Acute ischemia noted in the right frontoparietal lobes in the MCA territory. This could explain the rest of his symptoms. Neurology to weigh in further. Echocardiogram has been ordered. He will also need  evaluation of his carotid system. He did undergo stent placement for a right ICA aneurysm last year. Will defer to neurology regarding modality of testing whether it be ultrasound or CT angiogram. Patient is currently on aspirin and Plavix. He is on a statin. Lipid panel will be ordered. Also, check EKG.  Left arm twitching with weakness He was thought to have seizure activity as a reason for his twitching. He was placed on Keppra. Twitching has reduced. Unclear if he stroke detected on MRI is contributing or not. EEG did not show any epileptiform activity. Defer to neurology.  History of right ICA aneurysm status post stent placement Stable. He was apparently supposed to undergo an angiogram this week. He was started back on his Plavix . Discussed with Dr. Kathyrn Sheriff 4/7. No urgent need for angiogram. However, in view of the acute stroke, he will need further evaluation of his carotid system.   Essential Hypertension Monitor blood pressures closely. Holding his diuretic due to hyponatremia and hypokalemia. Allow permissive hypertension.  Hyponatremia and hypokalemia Likely due to diuretics. Could also be mildly dehydrated. Continue IV fluids for now. Potassium is normal. Sodium level is stable.   DVT Prophylaxis: Lovenox    Code Status: Full code  Family Communication: Discussed with his wife and patient's son Disposition Plan: Patient has improved. Okay for transfer to neuro telemetry. PT and OT evaluation. Swallow evaluation.    LOS: 2 days   [facility] Pager [phone] 12/30/2015, 10:22 AM  If 7PM-7AM, please contact night-coverage at [email or web address], password [email or web address]

## 2015-12-31 LAB — BASIC METABOLIC PANEL
ANION GAP: 10 (ref 5–15)
BUN: 7 mg/dL (ref 6–20)
CALCIUM: 8.9 mg/dL (ref 8.9–10.3)
CO2: 22 mmol/L (ref 22–32)
Chloride: 100 mmol/L — ABNORMAL LOW (ref 101–111)
Creatinine, Ser: 0.81 mg/dL (ref 0.61–1.24)
GLUCOSE: 88 mg/dL (ref 65–99)
POTASSIUM: 3.6 mmol/L (ref 3.5–5.1)
SODIUM: 132 mmol/L — AB (ref 135–145)

## 2015-12-31 LAB — LIPID PANEL
CHOLESTEROL: 133 mg/dL (ref 0–200)
HDL: 37 mg/dL — AB (ref >40)
LDL CALC: 80 mg/dL (ref 0–99)
Total CHOL/HDL Ratio: 3.6 RATIO
Triglycerides: 79 mg/dL (ref <150)
VLDL: 16 mg/dL (ref 0–40)

## 2015-12-31 MED ORDER — NICOTINE 14 MG/24HR TD PT24
14.0000 mg | MEDICATED_PATCH | Freq: Every day | TRANSDERMAL | Status: DC
  Administered 2015-12-31 – 2016-01-03 (×3): 14 mg via TRANSDERMAL
  Filled 2015-12-31 (×4): qty 1

## 2015-12-31 MED ORDER — ATORVASTATIN CALCIUM 40 MG PO TABS
40.0000 mg | ORAL_TABLET | ORAL | Status: DC
  Administered 2016-01-02: 40 mg via ORAL
  Filled 2015-12-31: qty 1

## 2015-12-31 MED ORDER — ATORVASTATIN CALCIUM 80 MG PO TABS
80.0000 mg | ORAL_TABLET | ORAL | Status: DC
  Administered 2015-12-31: 80 mg via ORAL
  Filled 2015-12-31: qty 1

## 2015-12-31 NOTE — Progress Notes (Signed)
Occupational Therapy Evaluation Patient Details Name: Damon Parrish MRN: [id number] DOB: [birth year] Today's Date: 12/31/2015    History of Present Illness [name] is a 75 y.o. 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   Clinical Impression   PTA, pt was independent with ADLs and mobility. Pt currently presents with mild L side weakness and balance deficits. Pt required min guard for LB ADLs and min guard-min assist for transfers and ambulation due to unsteadiness. Pt plans to d/c home with 24/7 assistance from his wife. Pt will benefit from continued acute OT to increase independence and safety with ADLs and mobility to allow for safe discharge home. Recommend HHOT upon discharge.    Follow Up Recommendations  Home health OT;Supervision - Intermittent    Equipment Recommendations  None recommended by OT    Recommendations for Other Services       Precautions / Restrictions Precautions Precautions: Fall Restrictions Weight Bearing Restrictions: No      Mobility Bed Mobility Overal bed mobility: Needs Assistance Bed Mobility: Supine to Sit     Supine to sit: Supervision     General bed mobility comments: Pt up in chair on OT arrival  Transfers Overall transfer level: Needs assistance Equipment used: Rolling walker (2 wheeled) Transfers: Sit to/from Stand Sit to Stand: Min assist         General transfer comment: Min assist for balance on initial stand and during ambulation due to posterior lean. Pt with staggering steps while ambulating in hallway and drifitng towards L side and unaware of this occurring.     Balance Overall balance assessment: Needs assistance Sitting-balance support: No upper extremity supported;Feet supported Sitting balance-Leahy  Scale: Good     Standing balance support: Bilateral upper extremity supported;During functional activity Standing balance-Leahy Scale: Poor Standing balance comment: Tending to lose blanace posteriorly                            ADL Overall ADL's : Needs assistance/impaired     Grooming: Wash/dry hands;Wash/dry face;Min guard;Standing   Upper Body Bathing: Supervision/ safety;Sitting   Lower Body Bathing: Min guard;Sit to/from stand   Upper Body Dressing : Supervision/safety;Sitting   Lower Body Dressing: Min guard;Sit to/from stand   Toilet Transfer: Minimal assistance;Ambulation;Regular Toilet;RW   Toileting- Water quality scientist and Hygiene: Min guard;Sit to/from stand       Functional mobility during ADLs: Minimal assistance;Rolling walker General ADL Comments: Reviewed signs and symptoms of a stroke.     Vision Vision Assessment?: Yes Eye Alignment: Within Functional Limits Ocular Range of Motion: Within Functional Limits Alignment/Gaze Preference: Within Defined Limits Tracking/Visual Pursuits: Decreased smoothness of horizontal tracking;Decreased smoothness of vertical tracking Saccades: Decreased speed of saccadic movement Convergence: Within functional limits Visual Fields: No apparent deficits   Perception Perception Perception Tested?: Yes Perception Deficits: Inattention/neglect;Body part identification;Object recognition Inattention/Neglect: Appears intact Spatial deficits: WNL   Praxis Praxis Praxis tested?: Within functional limits    Pertinent Vitals/Pain Pain Assessment: No/denies pain     Hand Dominance Right (eats with L)   Extremity/Trunk Assessment Upper Extremity Assessment Upper Extremity Assessment: LUE deficits/detail LUE Deficits / Details: STRENGTH: overall 4/5; AROM: wfl with increased time; SENSATION: intact LUE Coordination: decreased gross motor   Lower Extremity Assessment Lower Extremity Assessment: LLE  deficits/detail LLE Deficits / Details: Grossly 4+/5 throughout   Cervical / Trunk Assessment Cervical / Trunk Assessment: Kyphotic   Communication Communication Communication: No difficulties   Cognition Arousal/Alertness: Awake/alert Behavior During Therapy: WFL for tasks assessed/performed Overall Cognitive Status: Impaired/Different from baseline Area of Impairment: Safety/judgement         Safety/Judgement: Decreased awareness of deficits;Decreased awareness of safety     General Comments: Needs reinforcement to use RW for safety with ambulation. Pt is also unaware of balance deficits at times   General Comments       Exercises       Shoulder Instructions      Home Living Family/patient expects to be discharged to:: Private residence Living Arrangements: Spouse/significant other Available Help at Discharge: Family;Available 24 hours/day Type of Home: House Home Access: Stairs to enter [employer] of Steps: 1 Entrance Stairs-Rails: None Home Layout: One level     Bathroom Shower/Tub: Walk-in shower;Door   Bathroom Toilet: Handicapped height     Home Equipment: Environmental consultant - 2 wheels;Shower seat - built in;Hand held shower head          Prior Functioning/Environment Level of Independence: Independent             OT Diagnosis: Hemiplegia non-dominant side   OT Problem List: Decreased strength;Decreased range of motion;Decreased activity tolerance;Impaired balance (sitting and/or standing);Decreased coordination;Decreased safety awareness;Decreased knowledge of use of DME or AE;Decreased knowledge of precautions   OT Treatment/Interventions: Self-care/ADL training;Therapeutic exercise;Energy conservation;DME and/or AE instruction;Balance training;Patient/family education;Therapeutic activities    OT Goals(Current goals can be found in the care plan section) Acute Rehab OT Goals Patient Stated Goal: to have a cigarette OT Goal Formulation: With  patient Time For Goal Achievement: 01/14/16 Potential to Achieve Goals: Good ADL Goals Pt Will Transfer to Toilet: with modified independence;ambulating;regular height toilet Pt Will Perform Toileting - Clothing Manipulation and hygiene: with modified independence;sit to/from stand;sitting/lateral leans Pt/caregiver will Perform Home Exercise Program: Increased strength;Left upper extremity;With theraband;Independently;With written HEP provided Additional ADL Goal #1: Pt will verbalize 2 home safety strategies to increase safety with ADLs and/or mobility at home.  OT Frequency: Min 2X/week   Barriers to D/C:            Co-evaluation              End of Session Equipment Utilized During Treatment: Gait belt;Rolling walker Nurse Communication: Mobility status  Activity Tolerance: Patient tolerated treatment well Patient left: in chair;with call bell/phone within reach;with family/visitor present   Time: [id number] OT Time Calculation (min): 23 min Charges:  OT General Charges $OT Visit: 1 Procedure OT Evaluation $OT Eval Moderate Complexity: 1 Procedure OT Treatments $Self Care/Home Management : 8-22 mins G-Codes:    Redmond Baseman, OTR/L [phone] 12/31/2015, 1:08 PM

## 2015-12-31 NOTE — Evaluation (Signed)
Physical Therapy Evaluation Patient Details Name: Damon Parrish MRN: [id number] DOB: 06-21-1941 Today's Date: 12/31/2015   History of Present Illness  [name] is a 75 y.o. 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  Clinical Impression   Pt admitted with above diagnosis. Pt currently with functional limitations due to the deficits listed below (see PT Problem List). Pt will benefit from skilled PT to increase their independence and safety with mobility to allow discharge to the venue listed below.       Follow Up Recommendations Home health PT;Supervision/Assistance - 24 hour    Equipment Recommendations  3in1 (PT) (I believe he already has a RW)    Recommendations for Other Services OT consult     Precautions / Restrictions Precautions Precautions: Fall      Mobility  Bed Mobility Overal bed mobility: Needs Assistance Bed Mobility: Supine to Sit     Supine to sit: Supervision     General bed mobility comments: Overall moving well to EOB; Supervision for safety  Transfers Overall transfer level: Needs assistance Equipment used: None Transfers: Sit to/from Stand Sit to Stand: Min assist         General transfer comment: Min assist to steady at initial stand; noted he braced the backs of his LEs against bed for stability  Ambulation/Gait Ambulation/Gait assistance: Min assist;Mod assist Ambulation Distance (Feet): 120 Feet Assistive device: None;Rolling walker (2 wheeled) Gait Pattern/deviations: Step-through pattern     General Gait Details: Noting grossly unsteady gait with tendency to lose balance posteriorly; mod assist x3 to regain balance after loss of balance  Stairs            Wheelchair Mobility    Modified Rankin (Stroke Patients  Only)       Balance Overall balance assessment: Needs assistance           Standing balance-Leahy Scale: Poor Standing balance comment: Tending to lose blanace posteriorly                             Pertinent Vitals/Pain Pain Assessment: No/denies pain    Home Living Family/patient expects to be discharged to:: Private residence Living Arrangements: Spouse/significant other Available Help at Discharge: Family;Available 24 hours/day Type of Home: House Home Access: Stairs to enter Entrance Stairs-Rails: None Entrance Stairs-Number of Steps: 1 Home Layout: One level Home Equipment: Walker - 2 wheels;Shower seat - built in      Prior Function Level of Independence: Independent               Journalist, newspaper        Extremity/Trunk Assessment   Upper Extremity Assessment: Defer to OT evaluation (L UE grossly weaker than R)           Lower Extremity Assessment: LLE deficits/detail   LLE Deficits / Details: Grossly 4+/5 throughout     Communication   Communication: No difficulties  Cognition Arousal/Alertness: Awake/alert Behavior During Therapy: WFL for tasks assessed/performed Overall Cognitive Status: Impaired/Different from baseline Area of Impairment: Safety/judgement         Safety/Judgement: Decreased awareness of deficits     General Comments: Needs reinforcement to use RW for safety with amb    General Comments      Exercises  Assessment/Plan    PT Assessment Patient needs continued PT services  PT Diagnosis Difficulty walking   PT Problem List Decreased strength;Decreased balance;Decreased mobility;Decreased coordination;Decreased knowledge of use of DME;Decreased safety awareness  PT Treatment Interventions DME instruction;Gait training;Stair training;Functional mobility training;Therapeutic activities;Therapeutic exercise;Balance training;Patient/family education   PT Goals (Current goals can be found in the  Care Plan section) Acute Rehab PT Goals Patient Stated Goal: did not state PT Goal Formulation: With patient Time For Goal Achievement: 01/14/16 Potential to Achieve Goals: Good    Frequency Min 4X/week   Barriers to discharge        Co-evaluation               End of Session Equipment Utilized During Treatment: Gait belt Activity Tolerance: Patient tolerated treatment well Patient left: in chair;with call bell/phone within reach;with chair alarm set Nurse Communication: Mobility status         Time: [id number] PT Time Calculation (min) (ACUTE ONLY): 23 min   Charges:   PT Evaluation $PT Eval Moderate Complexity: 1 Procedure PT Treatments $Gait Training: 8-22 mins   PT G CodesRoney Marion Hamff 12/31/2015, 11:05 AM  Roney Marion, [place] Pager [phone] Office [phone]

## 2015-12-31 NOTE — Progress Notes (Addendum)
STROKE TEAM PROGRESS NOTE   HISTORY OF PRESENT ILLNESS Damon Parrish is an 75 y.o. 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 He takes Plavix at home. No prior history of seizures.  Last night starting at about 7 PM he started having waxing and waning tremulousness involving his left upper extremity and intermittently associated with these tremulous movements, she also had some head jerking movements of the right. No convulsive activity involving the left right upper and lower extent his. He appeared drowsy and altered during these episodes although he is able to answer questions. He was sent to the ER by his regular physicians for further evaluation.   When he initially presented to the ER, he was given Ativan with no benefit. He was noted to have full strength in all 4 extremities with initial presentation in the ER by Dr. Rogene Houston.   After several minutes, then his left upper extremity from looseness have stopped, he was noted to have weakness in his left side which had some waxing and waning quality to it.    SUBJECTIVE (INTERVAL HISTORY) his strength is improving, feels better.     OBJECTIVE Temp:  [97.4 F (36.3 C)-98.2 F (36.8 C)] 98.1 F (36.7 C) (04/09 0545) Pulse Rate:  [66-70] 68 (04/09 0545) Cardiac Rhythm:  [-] Normal sinus rhythm (04/08 1900) Resp:  [15-21] 16 (04/09 0545) BP: (161-180)/(90-100) 180/90 mmHg (04/09 0546) SpO2:  [99 %-100 %] 100 % (04/09 0545)  CBC:   Recent Labs Lab 12/28/15 0905 12/29/15 0330 12/30/15 0555  WBC 7.9 6.9 7.0  NEUTROABS 4.8  --   --   HGB 15.0 13.6 14.1  HCT 42.8 39.8 41.1  MCV 94.5 95.0 95.4  PLT 269 235 [id number]    Basic Metabolic Panel:   Recent Labs Lab 12/30/15 0555 12/31/15 0519  NA 131* 132*  K 3.8 3.6  CL 100* 100*  CO2 20* 22  GLUCOSE 85 88  BUN 7 7  CREATININE 0.67 0.81  CALCIUM 8.8* 8.9    Lipid Panel:     Component Value Date/Time   CHOL 133 12/31/2015 0519    TRIG 79 12/31/2015 0519   HDL 37* 12/31/2015 0519   CHOLHDL 3.6 12/31/2015 0519   VLDL 16 12/31/2015 0519   LDLCALC 80 12/31/2015 0519   HgbA1c: No results found for: HGBA1C Urine Drug Screen: No results found for: LABOPIA, COCAINSCRNUR, LABBENZ, AMPHETMU, THCU, LABBARB    IMAGING  [name] Wo Contrast 12/30/2015   Patchy acute ischemia 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 Involutional changes and minimal chronic small vessel ischemic disease. 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     CTA of head and neck  12/30/2015 1. Right ICA stent appears to be patent. Mild distal small vessel disease bilaterally. 2. No significant proximal stenosis, aneurysm, or branch vessel occlusion. 3. The right white matter infarcts are not visible by CT. 4. Multilevel spondylosis of the cervical spine. 5. Centrilobular emphysema.   PHYSICAL EXAM Physical exam: Exam: Gen: NAD Eyes: anicteric sclerae, moist conjunctivae                    CV: no MRG, no carotid bruits, no peripheral edema Mental Status: alert , follows commands, says it is April 2017, knows he is in [facility], knows the date today as  well.  Neuro: Detailed Neurologic Exam  Speech:  No aphasia or dysarthria   Cranial Nerves:    The pupils are equal, round, and reactive to light.. Attempted, Fundi not visualized.  EOMI. Midline conjugate gaze.  Visual fields full. Face symmetric, Tongue midline. Hearing intact to voice. Shoulder shrug intact  Motor Observation:    no involuntary movements noted. Tone appears normal.     Strength: Left arm without drift 3/5(improved today). Left leg milder weakness,  can keep left leg anti-gravity without drift. Right side strength intact.      Sensation:  Intact to LT, does not extinguish to dbl simultaneous  stim  Plantars equivocal .    ASSESSMENT/PLAN Damon Parrish is a 75 y.o. 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 He did not receive IV t-PA due to chronic subdural hematomas.  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  MRA - not performed  CTA of head and neck - unremarkable  Carotid Doppler - refer to CTA of neck  2D Echo - pending  LDL - 80  HgbA1c pending   EEG - unremarkable  VTE prophylaxis - Lovenox Diet Heart Room service appropriate?: Yes; Fluid consistency:: Thin  On Plavix prior to admission, now on plavix 75mg  daily  Have contacted Dr. Kathyrn Sheriff to see when to proceed with repeat Angiogram. discussed with Dr. Erlinda Hong.   Patient counseled to be compliant with his antithrombotic medications  Ongoing aggressive stroke risk factor management  Therapy recommendations: Pending  Disposition:  Pending  Hypertension  Stable - mildly elevated  Permissive hypertension (OK if < 220/120) but gradually normalize in 5-7 days  Hyperlipidemia  Home meds:  Lipitor 40 mg every other day resumed in hospital  LDL 80, goal < 70  Continue statin at time of discharge    Other Stroke Risk Factors  Advanced age  Cigarette smoker, advised to stop smoking    Other Active Problems  Status post stenting of a right carotid artery aneurysm August / September 2016. Pending repeat.  Seizure-like activity - EEG unremarkable - now on Keppra 1500 mg every 12 hours and Ativan as needed.  Hyponatremia - diuretic prior to admission - now on hold  Hospital day # [place] PA-C [facility] Pager [phone] 12/31/2015, 9:00 AM   Personally examined patient and images,  and have participated in and made any corrections needed to history, physical, neuro exam,assessment and plan as stated above.  I have personally obtained the history, evaluated lab date, reviewed imaging studies and agree with radiology interpretations.   75 year old male who presented for partial motor seizure of the left arm and leg subsequent MRI showed patchy frontoparietal MCA territory infarcts of unknown etiology. Of note he had a stent of the right carotid artery aneurysm in August 2016 and was due for repeat interventional cerebral angiography with Dr. Kathyrn Sheriff now put on hold due to seizure. Will continue with stroke workup.   Sarina Ill, MD Stroke Neurology [phone] [facility]    To contact Stroke Continuity provider, please refer to [email or web address] After hours, contact General Neurology

## 2015-12-31 NOTE — Progress Notes (Signed)
[facility] PROGRESS NOTE  Damon Parrish [id number] DOB: Dec 12, 1940 DOA: 12/28/2015  PCP: Tobe Sos, MD  Brief HPI: 75 year old Caucasian male with a past medical history of hypertension, right ICA aneurysm treated endovascularly in 2016, presented with left-sided twitching and left on weakness. Patient has been on Plavix. However, he stopped it about a week ago as the plan was for him to undergo angiography of the carotid system sometime this week. Patient was hospitalized for further management. Patient was seen by neurology. Patient underwent MRI which reveals stroke.  Past medical history:  Past Medical History  Diagnosis Date  . Hypertension   . Nonruptured cerebral aneurysm   . GERD (gastroesophageal reflux disease)   . Kidney stones   . H/O cerebral aneurysm repair 2016    "put stent in"  . High cholesterol   . Chronic sinus complaints   . Seizures ([place]) 12/27/2015; 12/28/2015  . Arthritis     "generalized" (12/28/2015)    Consultants: Neurology  Procedures:   Transthoracic echocardiogram is pending   Antibiotics: None  Subjective: Patient is more alert this morning. His wife is at bedside. Strength in the left upper extremity is improving. No further twitching activity.   Objective: Vital Signs  Filed Vitals:   12/30/15 2205 12/31/15 0312 12/31/15 0545 12/31/15 0546  BP: 161/93 164/95 172/100 180/90  Pulse: 69 66 68   Temp: 97.9 F (36.6 C) 97.7 F (36.5 C) 98.1 F (36.7 C)   TempSrc: Oral Oral Oral   Resp: 16 18 16    Height:      Weight:      SpO2: 100% 100% 100%     Intake/Output Summary (Last 24 hours) at 12/31/15 0838 Last data filed at 12/30/15 2201  Gross per 24 hour  Intake    193 ml  Output   1550 ml  Net  -1357 ml   Filed Weights   12/28/15 0859 12/28/15 1825  Weight: 70.308 kg (155 lb) 68.8 kg (151 lb 10.8 oz)    General appearance: appears stated age, distracted and no distress Resp: Diminished air entry at the bases.  No crackles or wheezing. No rhonchi. Cardio: regular rate and rhythm, S1, S2 normal, no murmur, click, rub or gallop GI: soft, non-tender; bowel sounds normal; no masses, no organomegaly Neurologic: Awake and alert. No obvious facial asymmetry. 3-4 out of 5 strength in the left upper extremity. Normal strength in right upper extremity. Equal strength bilateral lower extremities.  Lab Results:  Basic Metabolic Panel:  Recent Labs Lab 12/28/15 0905 12/29/15 0330 12/30/15 0555 12/31/15 0519  NA 129* 132* 131* 132*  K 3.5 3.2* 3.8 3.6  CL 94* 99* 100* 100*  CO2 21* 22 20* 22  GLUCOSE 108* 97 85 88  BUN 10 7 7 7   CREATININE 0.84 0.75 0.67 0.81  CALCIUM 9.5 8.8* 8.8* 8.9   Liver Function Tests:  Recent Labs Lab 12/28/15 0905 12/29/15 0330  AST 26 22  ALT 19 18  ALKPHOS 99 88  BILITOT 0.7 0.7  PROT 7.3 6.4*  ALBUMIN 4.2 3.4*   CBC:  Recent Labs Lab 12/28/15 0905 12/29/15 0330 12/30/15 0555  WBC 7.9 6.9 7.0  NEUTROABS 4.8  --   --   HGB 15.0 13.6 14.1  HCT 42.8 39.8 41.1  MCV 94.5 95.0 95.4  PLT 269 235 247    Recent Results (from the past 240 hour(s))  MRSA PCR Screening     Status: None   Collection Time: 12/28/15  6:37 PM  Result Value Ref Range Status   MRSA by PCR NEGATIVE NEGATIVE Final    Comment:        The GeneXpert MRSA Assay (FDA approved for NASAL specimens only), is one component of a comprehensive MRSA colonization surveillance program. It is not intended to diagnose MRSA infection nor to guide or monitor treatment for MRSA infections.       Studies/Results: Ct Angio Head W/cm &/or Wo Cm  12/30/2015  CLINICAL DATA:  Cerebral aneurysm repair and seizure. Stroke. Left upper extremity tremors and weakness. EXAM: CT ANGIOGRAPHY HEAD AND NECK TECHNIQUE: Multidetector CT imaging of the head and neck was performed using the standard protocol during bolus administration of intravenous contrast. Multiplanar CT image reconstructions and MIPs were  obtained to evaluate the vascular anatomy. Carotid stenosis measurements (when applicable) are obtained utilizing NASCET criteria, using the distal internal carotid diameter as the denominator. CONTRAST:  50 mL Isovue 370 COMPARISON:  MRI brain 12/30/2015 FINDINGS: CT HEAD Brain: Chronic extra-axial collections are again noted bilaterally, right greater than left. The right coronal radiata infarcts are not visible by CT. No acute cortical infarct, hemorrhage, or mass lesion is present. The ventricles are of normal size. Remote lacunar infarcts of the right basal ganglia are again noted. Calvarium and skull base: Within normal limits. Paranasal sinuses: Mild mucosal thickening is present within the anterior ethmoid air cells and bilateral maxillary sinuses. The remaining paranasal sinuses and the mastoid air cells are clear. Orbits: A focal calcification is present anteriorly in the right globe. The left globe is intact. The orbits are within normal limits bilaterally. CTA NECK Aortic arch: A 3 vessel arch configuration is present. Atherosclerotic calcifications are present without aneurysm or stenosis. Right carotid system: The right common carotid artery is within normal limits. The bifurcation is unremarkable. Cervical right ICA is normal. Left carotid system: The left common carotid artery is within normal limits. The bifurcation is unremarkable. The cervical left ICA demonstrates mild distal tortuosity without focal stenosis. Vertebral arteries:The vertebral arteries originate from the subclavian arteries bilaterally without focal stenosis. There is mild irregularity of both vessels as they enter the spinal canal. There is no significant stenosis relative to the more distal vessel. Skeleton: Multilevel endplate degenerative changes are present throughout the cervical and upper thoracic spine. Endplate sclerotic changes and uncovertebral spurring is present at C4-5 with bilateral stenosis. There is fusion across  the joint space at C5-6 and likely at C6-7. Endplate sclerotic changes are noted in the upper cervical spine. Alignment is maintained. Vertebral body heights are normal. No focal lytic or blastic lesions are present. Other neck: No focal mucosal or submucosal lesions are present. The tongue base is within normal limits. The vocal cords are midline and symmetric. The thyroid is within normal limits. No significant cervical adenopathy is present. The salivary glands are within normal limits bilaterally. Centrilobular emphysematous changes are noted at the lung apices bilaterally. No focal nodule, mass, or airspace disease is present. Biapical pleural parenchymal scarring is noted. CTA HEAD Anterior circulation: Atherosclerotic changes are present within the cavernous internal carotid arteries bilaterally. There is a stent across the right ophthalmic and supraclinoid segment compatible with a pipeline stent placed for treatment of a a right internal carotid artery aneurysm. The lumen is patent through the stent. Mild atherosclerotic narrowing is present in the left ophthalmic segment of less than 50%. The left ICA terminus is intact. The A1 and M1 segments are normal bilaterally. The MCA bifurcations are intact. Mild  segmental irregularity is present throughout the MCA and distal ACA branch vessels. There is no significant proximal stenosis or occlusion. Posterior circulation: Right vertebral artery is the dominant vessel. The PICA origins are visualized and normal. The vertebrobasilar junction is normal. The basilar artery is within normal limits. Both posterior cerebral arteries originate from the basilar tip. The PCA branch vessels are within normal limits bilaterally. Venous sinuses: The dural sinuses are patent. The right transverse sinus is dominant. Straight sinus and deep cerebral veins are patent. The cortical veins are intact. Anatomic variants: None Delayed phase: The postcontrast images demonstrate no  pathologic enhancement. IMPRESSION: 1. Right ICA stent appears to be patent. Mild distal small vessel disease bilaterally. 2. No significant proximal stenosis, aneurysm, or branch vessel occlusion. 3. The right white matter infarcts are not visible by CT. 4. Multilevel spondylosis of the cervical spine. 5. Centrilobular emphysema. Electronically Signed   By: San Morelle M.D.   On: 12/30/2015 21:26   Ct Angio Neck W/cm &/or Wo/cm  12/30/2015  CLINICAL DATA:  Cerebral aneurysm repair and seizure. Stroke. Left upper extremity tremors and weakness. EXAM: CT ANGIOGRAPHY HEAD AND NECK TECHNIQUE: Multidetector CT imaging of the head and neck was performed using the standard protocol during bolus administration of intravenous contrast. Multiplanar CT image reconstructions and MIPs were obtained to evaluate the vascular anatomy. Carotid stenosis measurements (when applicable) are obtained utilizing NASCET criteria, using the distal internal carotid diameter as the denominator. CONTRAST:  50 mL Isovue 370 COMPARISON:  MRI brain 12/30/2015 FINDINGS: CT HEAD Brain: Chronic extra-axial collections are again noted bilaterally, right greater than left. The right coronal radiata infarcts are not visible by CT. No acute cortical infarct, hemorrhage, or mass lesion is present. The ventricles are of normal size. Remote lacunar infarcts of the right basal ganglia are again noted. Calvarium and skull base: Within normal limits. Paranasal sinuses: Mild mucosal thickening is present within the anterior ethmoid air cells and bilateral maxillary sinuses. The remaining paranasal sinuses and the mastoid air cells are clear. Orbits: A focal calcification is present anteriorly in the right globe. The left globe is intact. The orbits are within normal limits bilaterally. CTA NECK Aortic arch: A 3 vessel arch configuration is present. Atherosclerotic calcifications are present without aneurysm or stenosis. Right carotid system: The  right common carotid artery is within normal limits. The bifurcation is unremarkable. Cervical right ICA is normal. Left carotid system: The left common carotid artery is within normal limits. The bifurcation is unremarkable. The cervical left ICA demonstrates mild distal tortuosity without focal stenosis. Vertebral arteries:The vertebral arteries originate from the subclavian arteries bilaterally without focal stenosis. There is mild irregularity of both vessels as they enter the spinal canal. There is no significant stenosis relative to the more distal vessel. Skeleton: Multilevel endplate degenerative changes are present throughout the cervical and upper thoracic spine. Endplate sclerotic changes and uncovertebral spurring is present at C4-5 with bilateral stenosis. There is fusion across the joint space at C5-6 and likely at C6-7. Endplate sclerotic changes are noted in the upper cervical spine. Alignment is maintained. Vertebral body heights are normal. No focal lytic or blastic lesions are present. Other neck: No focal mucosal or submucosal lesions are present. The tongue base is within normal limits. The vocal cords are midline and symmetric. The thyroid is within normal limits. No significant cervical adenopathy is present. The salivary glands are within normal limits bilaterally. Centrilobular emphysematous changes are noted at the lung apices bilaterally. No focal nodule, mass,  or airspace disease is present. Biapical pleural parenchymal scarring is noted. CTA HEAD Anterior circulation: Atherosclerotic changes are present within the cavernous internal carotid arteries bilaterally. There is a stent across the right ophthalmic and supraclinoid segment compatible with a pipeline stent placed for treatment of a a right internal carotid artery aneurysm. The lumen is patent through the stent. Mild atherosclerotic narrowing is present in the left ophthalmic segment of less than 50%. The left ICA terminus is  intact. The A1 and M1 segments are normal bilaterally. The MCA bifurcations are intact. Mild segmental irregularity is present throughout the MCA and distal ACA branch vessels. There is no significant proximal stenosis or occlusion. Posterior circulation: Right vertebral artery is the dominant vessel. The PICA origins are visualized and normal. The vertebrobasilar junction is normal. The basilar artery is within normal limits. Both posterior cerebral arteries originate from the basilar tip. The PCA branch vessels are within normal limits bilaterally. Venous sinuses: The dural sinuses are patent. The right transverse sinus is dominant. Straight sinus and deep cerebral veins are patent. The cortical veins are intact. Anatomic variants: None Delayed phase: The postcontrast images demonstrate no pathologic enhancement. IMPRESSION: 1. Right ICA stent appears to be patent. Mild distal small vessel disease bilaterally. 2. No significant proximal stenosis, aneurysm, or branch vessel occlusion. 3. The right white matter infarcts are not visible by CT. 4. Multilevel spondylosis of the cervical spine. 5. Centrilobular emphysema. Electronically Signed   By: San Morelle M.D.   On: 12/30/2015 21:26   [name] [id number] Contrast  12/30/2015  CLINICAL DATA:  Intermittent seizures, persistent LEFT upper extremity weakness may be postictal. History of hypertension, cerebral aneurysm, seizures. EXAM: MRI HEAD WITHOUT AND WITH CONTRAST TECHNIQUE: Multiplanar, multiecho pulse sequences of the brain and surrounding structures were obtained without and with intravenous contrast. CONTRAST:  36mL MULTIHANCE GADOBENATE DIMEGLUMINE 529 MG/ML IV SOLN COMPARISON:  CT head December 28, 2015 and MRI head Jan 31, 2011 FINDINGS: Multiple sequences are moderately motion degraded. Patchy subcentimeter reduced diffusion RIGHT frontal and parietal lobes, in addition to linear precentral gyrus reduced diffusion. Due to small size and motion, limited  assessment by ADC values though identifiable lesion does demonstrate low ADC values. Susceptibility artifact RIGHT carotid siphon associated with treated aneurysm and stent. No susceptibility artifact to suggest hemorrhage. Ventricles and sulci are normal for patient's age. No midline shift or mass effect. No definite masses. Minimal white matter changes compatible chronic small vessel ischemic disease are less than expected for age. No definite abnormal intracranial enhancement though motion degrades sensitivity for subtle potential enhancement. Stable LEFT and slightly larger 9 mm RIGHT frontal parietal T2 bright extra-axial fluid spaces. No extra-axial masses nor leptomeningeal enhancement. Normal major intracranial vascular flow voids seen at the skull base. Status post RIGHT ocular lens implant with shunt in the anterior chamber. No suspicious calvarial bone marrow signal. No abnormal sellar expansion. Craniocervical junction maintained. Mild paranasal sinus mucosal thickening with small LEFT frontal mucosal retention cyst. Small RIGHT mastoid effusion. IMPRESSION: Patchy acute ischemia 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 Involutional changes and minimal chronic small vessel ischemic disease. 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 Electronically Signed   By: Elon Alas M.D.   On: 12/30/2015 03:16    Medications:  Scheduled: . aspirin  325 mg Oral Daily  . atorvastatin  40 mg Oral QODAY  . clopidogrel  75 mg Oral Daily  . enoxaparin (LOVENOX) injection  40 mg Subcutaneous Q24H  . latanoprost  1 drop Both Eyes BID  . levETIRAcetam  1,500 mg Intravenous Q12H  . pantoprazole  20 mg Oral Daily  . sodium chloride flush  3 mL Intravenous Q12H   Continuous: . 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 This could explain the rest of his symptoms. Echocardiogram is pending. CT angiogram of head and neck did not show any significant stenosis. However, patient does have a stent in his left ICA. Discussed with neurology this morning. They would like for the neurosurgeon to proceed with the angiogram during this hospitalization. Patient is currently on aspirin and Plavix. He is on a statin. LDL is 80. EKG shows sinus rhythm.   Left arm twitching with weakness He was thought to have seizure activity as a reason for his twitching. He was placed on Keppra. Twitching has reduced. Unclear if the stroke detected on MRI is contributing or not. EEG did not show any epileptiform activity. Defer to neurology.  History of right ICA aneurysm status post stent placement Stable. He was apparently supposed to undergo an angiogram this past week. He was started back on his Plavix . Discussed with Dr. Kathyrn Sheriff 4/7. At that time there was no urgent need for angiogram. However, in view of the acute stroke, neurology feels that he may need angiogram during this hospitalization.   Essential Hypertension Monitor blood pressures closely. Holding his diuretic due to hyponatremia and hypokalemia. Allow permissive hypertension.  Hyponatremia and hypokalemia Likely due to diuretics. Cut back on IV fluid rate. Potassium is normal. Sodium level is stable.   DVT Prophylaxis: Lovenox    Code Status: Full code  Family Communication: Discussed with his wife and patient's son Disposition Plan: Stroke workup in  progress. Echocardiogram is pending. PT, OT evaluation is pending.     LOS: 3 days   [facility] Pager [phone] 12/31/2015, 8:38 AM  If 7PM-7AM, please contact night-coverage at [email or web address], password [facility]

## 2016-01-01 ENCOUNTER — Inpatient Hospital Stay (HOSPITAL_COMMUNITY): Payer: Medicare Other

## 2016-01-01 DIAGNOSIS — E785 Hyperlipidemia, unspecified: Secondary | ICD-10-CM | POA: Insufficient documentation

## 2016-01-01 DIAGNOSIS — I639 Cerebral infarction, unspecified: Secondary | ICD-10-CM

## 2016-01-01 DIAGNOSIS — I671 Cerebral aneurysm, nonruptured: Secondary | ICD-10-CM | POA: Insufficient documentation

## 2016-01-01 DIAGNOSIS — R569 Unspecified convulsions: Secondary | ICD-10-CM

## 2016-01-01 DIAGNOSIS — I6789 Other cerebrovascular disease: Secondary | ICD-10-CM

## 2016-01-01 LAB — RAPID URINE DRUG SCREEN, HOSP PERFORMED
Amphetamines: NOT DETECTED
Barbiturates: NOT DETECTED
Benzodiazepines: NOT DETECTED
COCAINE: NOT DETECTED
OPIATES: NOT DETECTED
Tetrahydrocannabinol: NOT DETECTED

## 2016-01-01 LAB — ECHOCARDIOGRAM COMPLETE
Height: 68 in
Weight: 2426.82 oz

## 2016-01-01 LAB — HEMOGLOBIN A1C
HEMOGLOBIN A1C: 6.1 % — AB (ref 4.8–5.6)
Mean Plasma Glucose: 128 mg/dL

## 2016-01-01 MED ORDER — LEVETIRACETAM 750 MG PO TABS
1500.0000 mg | ORAL_TABLET | Freq: Two times a day (BID) | ORAL | Status: DC
  Administered 2016-01-01 – 2016-01-03 (×4): 1500 mg via ORAL
  Filled 2016-01-01 (×4): qty 2

## 2016-01-01 NOTE — Progress Notes (Signed)
  Echocardiogram 2D Echocardiogram has been performed.  Jennette Dubin 01/01/2016, 11:46 AM

## 2016-01-01 NOTE — Progress Notes (Signed)
STROKE TEAM PROGRESS NOTE   SUBJECTIVE (INTERVAL HISTORY)  His wife is at the bedside. They feel he is doing better. Will do angio tomorrow with Dr. Kathyrn Sheriff. Working with PT/OT.    OBJECTIVE Temp:  [97.4 F (36.3 C)-98.3 F (36.8 C)] 98.1 F (36.7 C) (04/10 0623) Pulse Rate:  [73-82] 75 (04/10 0623) Cardiac Rhythm:  [-] Normal sinus rhythm (04/09 1900) Resp:  [16-20] 18 (04/10 0623) BP: (164-196)/(83-116) 177/96 mmHg (04/10 0623) SpO2:  [97 %-100 %] 99 % (04/10 0623)  CBC:   Recent Labs Lab 12/28/15 0905 12/29/15 0330 12/30/15 0555  WBC 7.9 6.9 7.0  NEUTROABS 4.8  --   --   HGB 15.0 13.6 14.1  HCT 42.8 39.8 41.1  MCV 94.5 95.0 95.4  PLT 269 235 [id number]    Basic Metabolic Panel:   Recent Labs Lab 12/30/15 0555 12/31/15 0519  NA 131* 132*  K 3.8 3.6  CL 100* 100*  CO2 20* 22  GLUCOSE 85 88  BUN 7 7  CREATININE 0.67 0.81  CALCIUM 8.8* 8.9    Lipid Panel:     Component Value Date/Time   CHOL 133 12/31/2015 0519   TRIG 79 12/31/2015 0519   HDL 37* 12/31/2015 0519   CHOLHDL 3.6 12/31/2015 0519   VLDL 16 12/31/2015 0519   LDLCALC 80 12/31/2015 0519   HgbA1c: No results found for: HGBA1C Urine Drug Screen: No results found for: LABOPIA, COCAINSCRNUR, LABBENZ, AMPHETMU, THCU, LABBARB    IMAGING I have personally reviewed the radiological images below and agree with the radiology interpretations.  Mr Damon Parrish Wo Contrast 12/30/2015   Patchy acute ischemia 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 Involutional changes and minimal chronic small vessel ischemic disease. 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   CTA of head and neck  12/30/2015 1. Right ICA stent appears to be patent. Mild distal small vessel disease bilaterally. 2. No significant proximal stenosis, aneurysm, or branch vessel occlusion. 3. The  right white matter infarcts are not visible by CT. 4. Multilevel spondylosis of the cervical spine. 5. Centrilobular emphysema.  EEG - normal EEG  Echo 2D - - LVEF 55-60%, mild LVH, normal wall motion, diastolic dysfunction,  indeterminate LV filling pressure, normal LA size, mild RAE,  trivial TR, normal RVSP.  LE venous doppler - negative for DVT  Cerebral angio -  Pending   PHYSICAL EXAM Gen: NAD Eyes: anicteric sclerae, moist conjunctivae                    CV: no MRG, no carotid bruits, no peripheral edema Mental Status: alert , follows commands, says it is April 2017, knows he is in [place], knows the date today as well.  Speech: No aphasia or dysarthria   Cranial Nerves:    The pupils are equal, round, and reactive to light.. Attempted, Fundi not visualized.  EOMI. Midline conjugate gaze.  Visual fields full. Face left nasolabial fold flattening, Tongue midline. Hearing intact to voice. Shoulder shrug intact  Motor Observation:    no involuntary movements noted. Tone appears normal.     Strength: Left arm without drift 5-/5 but with mild pronator drift. Left leg 5/5,  Right side strength intact.      Sensation:  Intact to LT, does not extinguish to dbl simultaneous stim  Plantars equivocal.   Coordination - left FTN  dysmetria proportional to weakness   ASSESSMENT/PLAN Damon Parrish is a 75 y.o. 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 He did not receive IV t-PA due to chronic subdural hematomas.  Stroke:  Left MCA/ACA watershed infarct possibly embolic vs watershed distribution  Resultant  Subtle left arm weakness now  MRI  Patchy acute ischemia RIGHT frontoparietal lobes in MCA/ACA territory infarct.  CTA of head and neck - unremarkable  2D Echo - EF 55-60%  LE venous doppler negative for DVT   LDL - 80  HgbA1c  6.1  EEG - unremarkable  VTE prophylaxis - Lovenox Diet Heart Room service appropriate?: Yes; Fluid consistency:: Thin  On Plavix and ASA prior to admission, now on plavix 75mg  daily.   Patient counseled to be compliant with his antithrombotic medications  Ongoing aggressive stroke risk factor management  Therapy recommendations: Pending  Disposition:  Pending  ? Seizure   Pt seems to have left UE tremor like activity on admission  Was given keppra   On keppra now  EEG negative  Right tICA aneurysm s/p stent  Repeat Angio in am  Continue plavix for now  May consider dual as per Dr. Kathyrn Sheriff  Hypertension  Stable - mildly elevated  Permissive hypertension (OK if < 180/105) but gradually normalize in 5-7 days  Hyperlipidemia  Home meds:  Lipitor 40 mg every other day resumed in hospital  LDL 80, goal < 70  Continue statin at time of discharge  Other Stroke Risk Factors  Advanced age  Cigarette smoker, advised to stop smoking  Other Active Problems  Hyponatremia - diuretic prior to admission - now on hold - [facility] day # 4  Damon Hawking, MD PhD Stroke Neurology 01/01/2016 3:39 PM    To contact Stroke Continuity provider, please refer to [email or web address] After hours, contact General Neurology

## 2016-01-01 NOTE — Progress Notes (Signed)
Physical Therapy Treatment Patient Details Name: Damon Parrish MRN: [id number] DOB: [birth year] Today's Date: 01/01/2016    History of Present Illness [name] is a 75 y.o. 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    PT Comments    Noting better balance today than last session; Still, a Berg Balance score of 39/56 is indicative of significant fall risk; At this time, recommend RW for amb full-time  Follow Up Recommendations  Home health PT;Supervision/Assistance - 24 hour     Equipment Recommendations  3in1 (PT) (I believe he already has a RW)    Recommendations for Other Services OT consult     Precautions / Restrictions Precautions Precautions: Fall    Mobility  Bed Mobility   Bed Mobility: Supine to Sit     Supine to sit: Modified independent (Device/Increase time)        Transfers Overall transfer level: Needs assistance Equipment used: None Transfers: Sit to/from Stand Sit to Stand: Min guard         General transfer comment: Minguard assist for safety; noted he braced the backs of his LEs against bed for stability, though less dependent on the bed than yesterday  Ambulation/Gait Ambulation/Gait assistance: Supervision;Min guard Ambulation Distance (Feet): 140 Feet Assistive device: Rolling walker (2 wheeled);Straight cane Gait Pattern/deviations: Step-through pattern     General Gait Details: More steady today with amb with no noted loss of balance with the use of RW; close guard assist with trial of walking with cane, no gross loss of balance noted   Stairs            Wheelchair Mobility    Modified Rankin (Stroke Patients Only)       Balance             Standing balance-Leahy Scale:  (approaching Fair)         01/01/16 1400  Standardized Balance Assessment  Standardized Balance Assessment  Berg Balance Test  Berg Balance Test  Sit to Stand 3  Standing Unsupported 3  Sitting with Back Unsupported but Feet Supported on Floor or Stool 4  Stand to Sit 3  Transfers 3  Standing Unsupported with Eyes Closed 4  Standing Ubsupported with Feet Together 1  From Standing, Reach Forward with Outstretched Arm 3  From Standing Position, Pick up Object from Floor 4  From Standing Position, Turn to Look Behind Over each Shoulder 4  Turn 360 Degrees 2  Standing Unsupported, Alternately Place Feet on Step/Stool 1  Standing Unsupported, One Foot in Front 3  Standing on One Leg 1  Total Score 39                   Cognition Arousal/Alertness: Awake/alert Behavior During Therapy: WFL for tasks assessed/performed Overall Cognitive Status: Impaired/Different from baseline Area of Impairment: Safety/judgement               General Comments: Needs reinforcement to use RW for safety with ambulation. Pt is also unaware of balance deficits at times    Exercises      General Comments        Pertinent Vitals/Pain Pain Assessment: No/denies pain    Home Living                      Prior  Function            PT Goals (current goals can now be found in the care plan section) Acute Rehab PT Goals Patient Stated Goal: Hopes to go home tomorrow PT Goal Formulation: With patient Time For Goal Achievement: 01/14/16 Potential to Achieve Goals: Good Progress towards PT goals: Progressing toward goals    Frequency  Min 4X/week    PT Plan Current plan remains appropriate    Co-evaluation             End of Session Equipment Utilized During Treatment: Gait belt Activity Tolerance: Patient tolerated treatment well Patient left: in chair;with call bell/phone within reach;with chair alarm set     Time: 1330 (Start time is approximate)-1357 PT Time Calculation (min) (ACUTE  ONLY): 27 min  Charges:  $Gait Training: 8-22 mins $Therapeutic Activity: 8-22 mins                    G Codes:      Quin Hoop 01/01/2016, 2:10 PM  Roney Marion, [place] Pager [phone] Office [phone]

## 2016-01-01 NOTE — Progress Notes (Signed)
Patients wife jumped off couch when patient was attempting to get out of bed, she seemed normal for 3-5 minutes after she stood up and then knelt beside sink and became diaphoretic and unresponsive, she had a radial and carotid pulse, her gaze was fixed and she was non verbal for a couple of minutes, I received assistance immediately from the staff on the floor and she was taken by rapid response to ED before any thing else could be assessed.

## 2016-01-01 NOTE — Progress Notes (Signed)
[facility] PROGRESS NOTE  Damon Parrish [id number] DOB: 09-17-1941 DOA: 12/28/2015  PCP: Tobe Sos, MD  Brief HPI: 75 year old Caucasian male with a past medical history of hypertension, right ICA aneurysm treated endovascularly in 2016, presented with left-sided twitching and left on weakness. Patient has been on Plavix. However, he stopped it about a week ago as the plan was for him to undergo angiography of the carotid system sometime this week. Patient was hospitalized for further management. Patient was seen by neurology. Patient underwent MRI which reveals stroke.  Past medical history:  Past Medical History  Diagnosis Date  . Hypertension   . Nonruptured cerebral aneurysm   . GERD (gastroesophageal reflux disease)   . Kidney stones   . H/O cerebral aneurysm repair 2016    "put stent in"  . High cholesterol   . Chronic sinus complaints   . Seizures ([place]) 12/27/2015; 12/28/2015  . Arthritis     "generalized" (12/28/2015)    Consultants: Neurology  Procedures:   Transthoracic echocardiogram Study Conclusions - Left ventricle: The cavity size was normal. Wall thickness was increased in a pattern of mild LVH. Systolic function was normal. The estimated ejection fraction was in the range of 55% to 60%. Wall motion was normal; there were no regional wall motion abnormalities. Doppler parameters are consistent with abnormal left ventricular relaxation (grade 1 diastolic dysfunction). The E/e&' ratio is between 8-15, suggesting indeterminate LV filling pressure. - Left atrium: The atrium was normal in size. - Right atrium: The atrium was mildly dilated. - Tricuspid valve: There was trivial regurgitation. - Pulmonary arteries: PA peak pressure: 23 mm Hg (S). - Inferior vena cava: The vessel was normal in size. The respirophasic diameter changes were in the normal range (>= 50%), consistent with normal central venous pressure. Impressions: - LVEF 55-60%, mild LVH,  normal wall motion, diastolic dysfunction, indeterminate LV filling pressure, normal LA size, mild RAE, trivial TR, normal RVSP.  Antibiotics: None  Subjective: Patient denies any complaints this morning. He wants to go home. Strength in the left arm is improving. No further twitching activity.    Objective: Vital Signs  Filed Vitals:   12/31/15 2212 01/01/16 0100 01/01/16 0105 01/01/16 0623  BP: 164/83 180/102 196/116 177/96  Pulse: 73 82  75  Temp: 97.8 F (36.6 C) 98.3 F (36.8 C)  98.1 F (36.7 C)  TempSrc: Oral Oral  Oral  Resp: 16 20  18   Height:      Weight:      SpO2: 97% 100%  99%    Intake/Output Summary (Last 24 hours) at 01/01/16 0853 Last data filed at 01/01/16 0545  Gross per 24 hour  Intake      3 ml  Output   1450 ml  Net  -1447 ml   Filed Weights   12/28/15 0859 12/28/15 1825  Weight: 70.308 kg (155 lb) 68.8 kg (151 lb 10.8 oz)    General appearance: appears stated age, distracted and no distress Resp: Improved air entry bilaterally. No wheezing, rales or rhonchi.  Cardio: regular rate and rhythm, S1, S2 normal, no murmur, click, rub or gallop GI: soft, non-tender; bowel sounds normal; no masses, no organomegaly Neurologic: Awake and alert. No obvious facial asymmetry. 4 out of 5 strength in the left upper extremity. Normal strength in right upper extremity. Equal strength bilateral lower extremities.  Lab Results:  Basic Metabolic Panel:  Recent Labs Lab 12/28/15 0905 12/29/15 0330 12/30/15 0555 12/31/15 0519  NA 129* 132* 131*  132*  K 3.5 3.2* 3.8 3.6  CL 94* 99* 100* 100*  CO2 21* 22 20* 22  GLUCOSE 108* 97 85 88  BUN 10 7 7 7   CREATININE 0.84 0.75 0.67 0.81  CALCIUM 9.5 8.8* 8.8* 8.9   Liver Function Tests:  Recent Labs Lab 12/28/15 0905 12/29/15 0330  AST 26 22  ALT 19 18  ALKPHOS 99 88  BILITOT 0.7 0.7  PROT 7.3 6.4*  ALBUMIN 4.2 3.4*   CBC:  Recent Labs Lab 12/28/15 0905 12/29/15 0330 12/30/15 0555  WBC 7.9 6.9  7.0  NEUTROABS 4.8  --   --   HGB 15.0 13.6 14.1  HCT 42.8 39.8 41.1  MCV 94.5 95.0 95.4  PLT 269 235 247    Recent Results (from the past 240 hour(s))  MRSA PCR Screening     Status: None   Collection Time: 12/28/15  6:37 PM  Result Value Ref Range Status   MRSA by PCR NEGATIVE NEGATIVE Final    Comment:        The GeneXpert MRSA Assay (FDA approved for NASAL specimens only), is one component of a comprehensive MRSA colonization surveillance program. It is not intended to diagnose MRSA infection nor to guide or monitor treatment for MRSA infections.       Studies/Results: Ct Angio Head W/cm &/or Wo Cm  12/30/2015  CLINICAL DATA:  Cerebral aneurysm repair and seizure. Stroke. Left upper extremity tremors and weakness. EXAM: CT ANGIOGRAPHY HEAD AND NECK TECHNIQUE: Multidetector CT imaging of the head and neck was performed using the standard protocol during bolus administration of intravenous contrast. Multiplanar CT image reconstructions and MIPs were obtained to evaluate the vascular anatomy. Carotid stenosis measurements (when applicable) are obtained utilizing NASCET criteria, using the distal internal carotid diameter as the denominator. CONTRAST:  50 mL Isovue 370 COMPARISON:  MRI brain 12/30/2015 FINDINGS: CT HEAD Brain: Chronic extra-axial collections are again noted bilaterally, right greater than left. The right coronal radiata infarcts are not visible by CT. No acute cortical infarct, hemorrhage, or mass lesion is present. The ventricles are of normal size. Remote lacunar infarcts of the right basal ganglia are again noted. Calvarium and skull base: Within normal limits. Paranasal sinuses: Mild mucosal thickening is present within the anterior ethmoid air cells and bilateral maxillary sinuses. The remaining paranasal sinuses and the mastoid air cells are clear. Orbits: A focal calcification is present anteriorly in the right globe. The left globe is intact. The orbits are within  normal limits bilaterally. CTA NECK Aortic arch: A 3 vessel arch configuration is present. Atherosclerotic calcifications are present without aneurysm or stenosis. Right carotid system: The right common carotid artery is within normal limits. The bifurcation is unremarkable. Cervical right ICA is normal. Left carotid system: The left common carotid artery is within normal limits. The bifurcation is unremarkable. The cervical left ICA demonstrates mild distal tortuosity without focal stenosis. Vertebral arteries:The vertebral arteries originate from the subclavian arteries bilaterally without focal stenosis. There is mild irregularity of both vessels as they enter the spinal canal. There is no significant stenosis relative to the more distal vessel. Skeleton: Multilevel endplate degenerative changes are present throughout the cervical and upper thoracic spine. Endplate sclerotic changes and uncovertebral spurring is present at C4-5 with bilateral stenosis. There is fusion across the joint space at C5-6 and likely at C6-7. Endplate sclerotic changes are noted in the upper cervical spine. Alignment is maintained. Vertebral body heights are normal. No focal lytic or blastic lesions are  present. Other neck: No focal mucosal or submucosal lesions are present. The tongue base is within normal limits. The vocal cords are midline and symmetric. The thyroid is within normal limits. No significant cervical adenopathy is present. The salivary glands are within normal limits bilaterally. Centrilobular emphysematous changes are noted at the lung apices bilaterally. No focal nodule, mass, or airspace disease is present. Biapical pleural parenchymal scarring is noted. CTA HEAD Anterior circulation: Atherosclerotic changes are present within the cavernous internal carotid arteries bilaterally. There is a stent across the right ophthalmic and supraclinoid segment compatible with a pipeline stent placed for treatment of a a right  internal carotid artery aneurysm. The lumen is patent through the stent. Mild atherosclerotic narrowing is present in the left ophthalmic segment of less than 50%. The left ICA terminus is intact. The A1 and M1 segments are normal bilaterally. The MCA bifurcations are intact. Mild segmental irregularity is present throughout the MCA and distal ACA branch vessels. There is no significant proximal stenosis or occlusion. Posterior circulation: Right vertebral artery is the dominant vessel. The PICA origins are visualized and normal. The vertebrobasilar junction is normal. The basilar artery is within normal limits. Both posterior cerebral arteries originate from the basilar tip. The PCA branch vessels are within normal limits bilaterally. Venous sinuses: The dural sinuses are patent. The right transverse sinus is dominant. Straight sinus and deep cerebral veins are patent. The cortical veins are intact. Anatomic variants: None Delayed phase: The postcontrast images demonstrate no pathologic enhancement. IMPRESSION: 1. Right ICA stent appears to be patent. Mild distal small vessel disease bilaterally. 2. No significant proximal stenosis, aneurysm, or branch vessel occlusion. 3. The right white matter infarcts are not visible by CT. 4. Multilevel spondylosis of the cervical spine. 5. Centrilobular emphysema. Electronically Signed   By: San Morelle M.D.   On: 12/30/2015 21:26   Ct Angio Neck W/cm &/or Wo/cm  12/30/2015  CLINICAL DATA:  Cerebral aneurysm repair and seizure. Stroke. Left upper extremity tremors and weakness. EXAM: CT ANGIOGRAPHY HEAD AND NECK TECHNIQUE: Multidetector CT imaging of the head and neck was performed using the standard protocol during bolus administration of intravenous contrast. Multiplanar CT image reconstructions and MIPs were obtained to evaluate the vascular anatomy. Carotid stenosis measurements (when applicable) are obtained utilizing NASCET criteria, using the distal internal  carotid diameter as the denominator. CONTRAST:  50 mL Isovue 370 COMPARISON:  MRI brain 12/30/2015 FINDINGS: CT HEAD Brain: Chronic extra-axial collections are again noted bilaterally, right greater than left. The right coronal radiata infarcts are not visible by CT. No acute cortical infarct, hemorrhage, or mass lesion is present. The ventricles are of normal size. Remote lacunar infarcts of the right basal ganglia are again noted. Calvarium and skull base: Within normal limits. Paranasal sinuses: Mild mucosal thickening is present within the anterior ethmoid air cells and bilateral maxillary sinuses. The remaining paranasal sinuses and the mastoid air cells are clear. Orbits: A focal calcification is present anteriorly in the right globe. The left globe is intact. The orbits are within normal limits bilaterally. CTA NECK Aortic arch: A 3 vessel arch configuration is present. Atherosclerotic calcifications are present without aneurysm or stenosis. Right carotid system: The right common carotid artery is within normal limits. The bifurcation is unremarkable. Cervical right ICA is normal. Left carotid system: The left common carotid artery is within normal limits. The bifurcation is unremarkable. The cervical left ICA demonstrates mild distal tortuosity without focal stenosis. Vertebral arteries:The vertebral arteries originate from the subclavian  arteries bilaterally without focal stenosis. There is mild irregularity of both vessels as they enter the spinal canal. There is no significant stenosis relative to the more distal vessel. Skeleton: Multilevel endplate degenerative changes are present throughout the cervical and upper thoracic spine. Endplate sclerotic changes and uncovertebral spurring is present at C4-5 with bilateral stenosis. There is fusion across the joint space at C5-6 and likely at C6-7. Endplate sclerotic changes are noted in the upper cervical spine. Alignment is maintained. Vertebral body heights  are normal. No focal lytic or blastic lesions are present. Other neck: No focal mucosal or submucosal lesions are present. The tongue base is within normal limits. The vocal cords are midline and symmetric. The thyroid is within normal limits. No significant cervical adenopathy is present. The salivary glands are within normal limits bilaterally. Centrilobular emphysematous changes are noted at the lung apices bilaterally. No focal nodule, mass, or airspace disease is present. Biapical pleural parenchymal scarring is noted. CTA HEAD Anterior circulation: Atherosclerotic changes are present within the cavernous internal carotid arteries bilaterally. There is a stent across the right ophthalmic and supraclinoid segment compatible with a pipeline stent placed for treatment of a a right internal carotid artery aneurysm. The lumen is patent through the stent. Mild atherosclerotic narrowing is present in the left ophthalmic segment of less than 50%. The left ICA terminus is intact. The A1 and M1 segments are normal bilaterally. The MCA bifurcations are intact. Mild segmental irregularity is present throughout the MCA and distal ACA branch vessels. There is no significant proximal stenosis or occlusion. Posterior circulation: Right vertebral artery is the dominant vessel. The PICA origins are visualized and normal. The vertebrobasilar junction is normal. The basilar artery is within normal limits. Both posterior cerebral arteries originate from the basilar tip. The PCA branch vessels are within normal limits bilaterally. Venous sinuses: The dural sinuses are patent. The right transverse sinus is dominant. Straight sinus and deep cerebral veins are patent. The cortical veins are intact. Anatomic variants: None Delayed phase: The postcontrast images demonstrate no pathologic enhancement. IMPRESSION: 1. Right ICA stent appears to be patent. Mild distal small vessel disease bilaterally. 2. No significant proximal stenosis,  aneurysm, or branch vessel occlusion. 3. The right white matter infarcts are not visible by CT. 4. Multilevel spondylosis of the cervical spine. 5. Centrilobular emphysema. Electronically Signed   By: San Morelle M.D.   On: 12/30/2015 21:26    Medications:  Scheduled: . [START ON 01/02/2016] atorvastatin  40 mg Oral QODAY  . clopidogrel  75 mg Oral Daily  . enoxaparin (LOVENOX) injection  40 mg Subcutaneous Q24H  . latanoprost  1 drop Both Eyes BID  . levETIRAcetam  1,500 mg Intravenous Q12H  . nicotine  14 mg Transdermal Daily  . pantoprazole  20 mg Oral Daily  . sodium chloride flush  3 mL Intravenous Q12H   Continuous: . 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 Echocardiogram is As above. CT angiogram of head and neck did not show any significant stenosis. However, patient does have a stent in his left ICA. Discussed with neurology  this morning. They would like for the neurosurgeon to proceed with the angiogram during this hospitalization. Dr. Kathyrn Sheriff is aware. Plan is for angiogram tomorrow afternoon. Patient is currently on aspirin and Plavix. He is on a statin. LDL is 80. EKG shows sinus rhythm.   Left arm twitching with weakness He was thought to have seizure activity as a reason for his twitching. He was placed on Keppra. Twitching appears to have resolved. Unclear if the stroke detected on MRI is contributing or not. EEG did not show any epileptiform activity. Continue Keppra.  History of right ICA aneurysm status post stent  placement Stable. He was apparently supposed to undergo an angiogram last week. This was held due to onset of seizures. But now due to acute stroke involving the right hemisphere, he will need further evaluation and so plan is for angiogram tomorrow afternoon. He was started back on his Plavix.    Essential Hypertension Monitor blood pressures closely. Holding his diuretic due to hyponatremia and hypokalemia. Allow permissive hypertension. Will need alternative antihypertensive agent at discharge.  Hyponatremia and hypokalemia Likely due to diuretics. Cut back on IV fluid rate. Potassium is normal. Sodium level is stable.   DVT Prophylaxis: Lovenox    Code Status: Full code  Family Communication: Discussed with his wife Disposition Plan: Angiogram tomorrow. Seen by physical therapy. Home health will be arranged at the time of discharge. Hopefully, will be able to go home after angiogram tomorrow.    LOS: 4 days   [facility] Pager [phone] 01/01/2016, 8:53 AM  If 7PM-7AM, please contact night-coverage at [email or web address], password [facility]

## 2016-01-01 NOTE — Care Management Note (Signed)
Case Management Note  Patient Details  Name: DAILYN AMBERGER MRN: [id number] Date of Birth: 1941/03/18  Subjective/Objective:                    Action/Plan: Patient was admitted with CVA, seizure-like activity. Lives at home with spouse. Will follow for discharge needs pending PT/OT evals and physician orders.  Expected Discharge Date:                  Expected Discharge Plan:     In-House Referral:     Discharge planning Services     Post Acute Care Choice:    Choice offered to:     DME Arranged:    DME Agency:     HH Arranged:    HH Agency:     Status of Service:  In process, will continue to follow  Medicare Important Message Given:    Date Medicare IM Given:    Medicare IM give by:    Date Additional Medicare IM Given:    Additional Medicare Important Message give by:     If discussed at [place] of Stay Meetings, dates discussed:    Additional CommentsRolm Baptise, RN 01/01/2016, 10:21 AM [phone]

## 2016-01-01 NOTE — Progress Notes (Signed)
Preliminary results by tech - Venous Duplex Lower Ext. Completed. Negative for deep and superficial vein thrombosis. Daniya Aramburo, BS, RDMS, RVT  

## 2016-01-01 NOTE — Care Management Important Message (Signed)
Important Message  Patient Details  Name: Damon Parrish MRN: [id number] Date of Birth: 07-09-1941   Medicare Important Message Given:  Yes    Nathen May 01/01/2016, 12:43 PM

## 2016-01-02 ENCOUNTER — Inpatient Hospital Stay (HOSPITAL_COMMUNITY): Payer: Medicare Other

## 2016-01-02 MED ORDER — IOPAMIDOL (ISOVUE-300) INJECTION 61%
INTRAVENOUS | Status: AC
  Administered 2016-01-02: 60 mL
  Filled 2016-01-02: qty 150

## 2016-01-02 MED ORDER — LIDOCAINE HCL 1 % IJ SOLN
INTRAMUSCULAR | Status: AC
  Administered 2016-01-02: 8 mL
  Filled 2016-01-02: qty 20

## 2016-01-02 MED ORDER — FENTANYL CITRATE (PF) 100 MCG/2ML IJ SOLN
INTRAMUSCULAR | Status: AC | PRN
  Administered 2016-01-02: 25 ug via INTRAVENOUS

## 2016-01-02 MED ORDER — MIDAZOLAM HCL 2 MG/2ML IJ SOLN
INTRAMUSCULAR | Status: AC
  Filled 2016-01-02: qty 2

## 2016-01-02 MED ORDER — HEPARIN SOD (PORK) LOCK FLUSH 100 UNIT/ML IV SOLN
INTRAVENOUS | Status: AC | PRN
  Administered 2016-01-02: 2000 [IU] via INTRAVENOUS

## 2016-01-02 MED ORDER — FENTANYL CITRATE (PF) 100 MCG/2ML IJ SOLN
INTRAMUSCULAR | Status: AC
  Filled 2016-01-02: qty 2

## 2016-01-02 MED ORDER — MIDAZOLAM HCL 2 MG/2ML IJ SOLN
INTRAMUSCULAR | Status: AC | PRN
  Administered 2016-01-02: 1 mg via INTRAVENOUS

## 2016-01-02 MED ORDER — HEPARIN SOD (PORK) LOCK FLUSH 100 UNIT/ML IV SOLN
INTRAVENOUS | Status: AC
  Filled 2016-01-02: qty 20

## 2016-01-02 NOTE — Progress Notes (Signed)
STROKE TEAM PROGRESS NOTE   SUBJECTIVE (INTERVAL HISTORY)  His wife is at the bedside. He has been NPO over night - awaiting for procedure. Overnight no complains.   OBJECTIVE Temp:  [97.9 F (36.6 C)-98.9 F (37.2 C)] 97.9 F (36.6 C) (04/11 0519) Pulse Rate:  [69-78] 73 (04/11 0519) Cardiac Rhythm:  [-] Normal sinus rhythm (04/10 2008) Resp:  [18] 18 (04/11 0519) BP: (114-179)/(78-106) 175/96 mmHg (04/11 0520) SpO2:  [96 %-100 %] 97 % (04/11 0519)  CBC:   Recent Labs Lab 12/28/15 0905 12/29/15 0330 12/30/15 0555  WBC 7.9 6.9 7.0  NEUTROABS 4.8  --   --   HGB 15.0 13.6 14.1  HCT 42.8 39.8 41.1  MCV 94.5 95.0 95.4  PLT 269 235 [id number]    Basic Metabolic Panel:   Recent Labs Lab 12/30/15 0555 12/31/15 0519  NA 131* 132*  K 3.8 3.6  CL 100* 100*  CO2 20* 22  GLUCOSE 85 88  BUN 7 7  CREATININE 0.67 0.81  CALCIUM 8.8* 8.9    Lipid Panel:     Component Value Date/Time   CHOL 133 12/31/2015 0519   TRIG 79 12/31/2015 0519   HDL 37* 12/31/2015 0519   CHOLHDL 3.6 12/31/2015 0519   VLDL 16 12/31/2015 0519   LDLCALC 80 12/31/2015 0519   HgbA1c:  Lab Results  Component Value Date   HGBA1C 6.1* 12/31/2015   Urine Drug Screen:     Component Value Date/Time   LABOPIA NONE DETECTED 01/01/2016 2123   COCAINSCRNUR NONE DETECTED 01/01/2016 2123   LABBENZ NONE DETECTED 01/01/2016 2123   AMPHETMU NONE DETECTED 01/01/2016 2123   THCU NONE DETECTED 01/01/2016 2123   LABBARB NONE DETECTED 01/01/2016 2123      IMAGING I have personally reviewed the radiological images below and agree with the radiology interpretations.  [name] Wo Contrast 12/30/2015   Patchy acute ischemia 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 Involutional changes and minimal chronic small vessel ischemic disease. 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   CTA of head and neck  12/30/2015 1. Right ICA stent appears to be patent. Mild distal small vessel disease bilaterally. 2. No significant proximal stenosis, aneurysm, or branch vessel occlusion. 3. The right white matter infarcts are not visible by CT. 4. Multilevel spondylosis of the cervical spine. 5. Centrilobular emphysema.  EEG - normal EEG  Echo 2D - LVEF 55-60%, mild LVH, normal wall motion, diastolic dysfunction,  indeterminate LV filling pressure, normal LA size, mild RAE,  trivial TR, normal RVSP.  LE venous doppler - negative for DVT  Cerebral angio -   1. 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 There is minimal proximal stenosis without any flow limitation. 2. 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 This is stable in comparison to prior angiograms.   PHYSICAL EXAM Gen: NAD Eyes: anicteric sclerae, moist conjunctivae                    CV: no MRG, no carotid bruits, no peripheral edema Mental Status: alert , follows commands, says it is April 2017, knows he is in [facility] [place], knows the date today as well.  Speech: No aphasia or dysarthria   Cranial Nerves:    The pupils are equal, round, and  reactive to light.. Attempted, Fundi not visualized.  EOMI. Midline conjugate gaze.  Visual fields full. Face left nasolabial fold flattening, Tongue midline. Hearing intact to voice. Shoulder shrug intact  Motor Observation:    no involuntary movements noted. Tone appears normal.     Strength: Left arm without drift 5-/5 but with mild pronator drift. Left leg 5/5,  Right side strength intact.      Sensation:  Intact to LT, does not extinguish to dbl simultaneous stim  Plantars equivocal.   Coordination - left FTN dysmetria proportional to weakness   ASSESSMENT/PLAN Mr. GIOMAR ROHS is a 75 y.o. 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 He did not receive IV t-PA due to chronic subdural hematomas.  Stroke:  Left MCA/ACA watershed infarct possibly embolic vs watershed infarct  Resultant  Subtle left arm weakness   MRI  Patchy acute ischemia RIGHT frontoparietal lobes in MCA/ACA territory infarct.  CTA of head and neck - unremarkable  2D Echo - EF 55-60%  LE venous doppler negative for DVT  angio showed minimal ICA stenosis at the site of pipeline stent  Recommend 30 day cardiac event monitoring as outpt to rule out afib  LDL - 80  HgbA1c 6.1  EEG - unremarkable  VTE prophylaxis - Lovenox Diet NPO time specified  On Plavix and ASA prior to admission, now on plavix 75mg  daily.   Patient counseled to be compliant with his antithrombotic medications  Ongoing aggressive stroke risk factor management  Therapy recommendations: HH PT, OT pending  Disposition:  Anticipate return home  ? Seizure   Pt seems to have left UE tremor like activity on admission  Was given keppra   On keppra now  EEG negative  Right tICA aneurysm s/p stent  Repeat Angio showed pipeline stent patency with minimal stenosis  Continue plavix for now  Follow up with Dr. Kathyrn Sheriff  Hypertension  Stable - remains elevated  Permissive hypertension (OK if < 180/105) but gradually normalize in 5-7 days  Hyperlipidemia  Home meds:  Lipitor 40 mg every other day resumed in hospital  LDL 80, goal < 70  Continue statin at time of discharge  Other Stroke Risk Factors  Advanced age  Cigarette smoker, advised to stop smoking  Other Active Problems  Hyponatremia - diuretic prior to admission - now on hold - [facility] day # 5  Neurology will sign off. Please call with questions. Pt will follow up with Dr. Erlinda Hong at [facility] in about 2 months. Thanks for the consult.  Rosalin Hawking, MD PhD Stroke  Neurology 01/02/2016 5:31 PM    To contact Stroke Continuity provider, please refer to [email or web address] After hours, contact General Neurology

## 2016-01-02 NOTE — Progress Notes (Signed)
[facility] PROGRESS NOTE  BOWIE SCELSI [id number] DOB: [birth year] DOA: 12/28/2015  PCP: Tobe Sos, MD  Brief HPI: 75 year old Caucasian male with a past medical history of hypertension, right ICA aneurysm treated endovascularly in 2016, presented with left-sided twitching and left on weakness. Patient has been on Plavix. However, he stopped it about a week ago as the plan was for him to undergo angiography of the carotid system sometime this week. Patient was hospitalized for further management. Patient was seen by neurology. Patient underwent MRI which reveals stroke. Patient was seen by neurology. Plan is for angiogram today.  Past medical history:  Past Medical History  Diagnosis Date  . Hypertension   . Nonruptured cerebral aneurysm   . GERD (gastroesophageal reflux disease)   . Kidney stones   . H/O cerebral aneurysm repair 2016    "put stent in"  . High cholesterol   . Chronic sinus complaints   . Seizures ([place]) 12/27/2015; 12/28/2015  . Arthritis     "generalized" (12/28/2015)    Consultants: Neurology. Neurosurgery  Procedures:   Transthoracic echocardiogram Study Conclusions - Left ventricle: The cavity size was normal. Wall thickness was increased in a pattern of mild LVH. Systolic function was normal. The estimated ejection fraction was in the range of 55% to 60%. Wall motion was normal; there were no regional wall motion abnormalities. Doppler parameters are consistent with abnormal left ventricular relaxation (grade 1 diastolic dysfunction). The E/e&' ratio is between 8-15, suggesting indeterminate LV filling pressure. - Left atrium: The atrium was normal in size. - Right atrium: The atrium was mildly dilated. - Tricuspid valve: There was trivial regurgitation. - Pulmonary arteries: PA peak pressure: 23 mm Hg (S). - Inferior vena cava: The vessel was normal in size. The respirophasic diameter changes were in the normal range (>= 50%), consistent with  normal central venous pressure. Impressions: - LVEF 55-60%, mild LVH, normal wall motion, diastolic dysfunction, indeterminate LV filling pressure, normal LA size, mild RAE, trivial TR, normal RVSP.  Diagnostic cerebral angiogram planned for 4/11  Antibiotics: None  Subjective: Patient feels well. Denies any complaints. No headaches. Strength in the left arm is much improved. No further twitching activity. His wife is at the bedside.    Objective: Vital Signs  Filed Vitals:   01/01/16 2120 01/02/16 0128 01/02/16 0519 01/02/16 0520  BP: 170/100 159/92 179/106 175/96  Pulse:  71 73   Temp:  97.9 F (36.6 C) 97.9 F (36.6 C)   TempSrc:  Oral Oral   Resp:  18 18   Height:      Weight:      SpO2:  96% 97%     Intake/Output Summary (Last 24 hours) at 01/02/16 0827 Last data filed at 01/01/16 2224  Gross per 24 hour  Intake      0 ml  Output    300 ml  Net   -300 ml   Filed Weights   12/28/15 0859 12/28/15 1825  Weight: 70.308 kg (155 lb) 68.8 kg (151 lb 10.8 oz)    General appearance: appears stated age, no distress Resp: Improved air entry bilaterally. No wheezing, rales or rhonchi.  Cardio: regular rate and rhythm, S1, S2 normal, no murmur, click, rub or gallop GI: soft, non-tender; bowel sounds normal; no masses, no organomegaly Neurologic: Awake and alert. No obvious facial asymmetry. 4 out of 5 strength in the left upper extremity. Normal strength in right upper extremity. Equal strength bilateral lower extremities.  Lab Results:  Basic  Metabolic Panel:  Recent Labs Lab 12/28/15 0905 12/29/15 0330 12/30/15 0555 12/31/15 0519  NA 129* 132* 131* 132*  K 3.5 3.2* 3.8 3.6  CL 94* 99* 100* 100*  CO2 21* 22 20* 22  GLUCOSE 108* 97 85 88  BUN 10 7 7 7   CREATININE 0.84 0.75 0.67 0.81  CALCIUM 9.5 8.8* 8.8* 8.9   Liver Function Tests:  Recent Labs Lab 12/28/15 0905 12/29/15 0330  AST 26 22  ALT 19 18  ALKPHOS 99 88  BILITOT 0.7 0.7  PROT 7.3 6.4*    ALBUMIN 4.2 3.4*   CBC:  Recent Labs Lab 12/28/15 0905 12/29/15 0330 12/30/15 0555  WBC 7.9 6.9 7.0  NEUTROABS 4.8  --   --   HGB 15.0 13.6 14.1  HCT 42.8 39.8 41.1  MCV 94.5 95.0 95.4  PLT 269 235 247    Recent Results (from the past 240 hour(s))  MRSA PCR Screening     Status: None   Collection Time: 12/28/15  6:37 PM  Result Value Ref Range Status   MRSA by PCR NEGATIVE NEGATIVE Final    Comment:        The GeneXpert MRSA Assay (FDA approved for NASAL specimens only), is one component of a comprehensive MRSA colonization surveillance program. It is not intended to diagnose MRSA infection nor to guide or monitor treatment for MRSA infections.       Studies/Results: No results found.  Medications:  Scheduled: . atorvastatin  40 mg Oral QODAY  . clopidogrel  75 mg Oral Daily  . enoxaparin (LOVENOX) injection  40 mg Subcutaneous Q24H  . latanoprost  1 drop Both Eyes BID  . levETIRAcetam  1,500 mg Oral BID  . nicotine  14 mg Transdermal Daily  . pantoprazole  20 mg Oral Daily  . sodium chloride flush  3 mL Intravenous Q12H   Continuous: . 0.9 % NaCl with KCl 20 mEq / L 50 mL/hr at 12/31/15 1156   [id number] **OR** acetaminophen, hydrALAZINE, LORazepam, ondansetron **OR** ondansetron (ZOFRAN) IV, senna-docusate  Assessment/Plan:  Principal Problem:   Focal seizure ([place]) Active Problems:   Essential hypertension   Hyponatremia   Acute CVA (cerebrovascular accident) ([place])   Acute right MCA stroke (H[facility])   Aneurysm, cerebral, nonruptured   HLD (hyperlipidemia)    Acute stroke Acute ischemia noted in the right frontoparietal lobes in the MCA territory. Echocardiogram is As above. CT angiogram of head and neck did not show any significant stenosis. However, patient does have a stent in his left ICA. Discussed with neurology. They would like for the neurosurgeon to proceed with the angiogram during this hospitalization. Dr. Kathyrn Sheriff is aware. Plan  is for angiogram this afternoon. Patient is currently on aspirin and Plavix. He is on a statin. LDL is 80. EKG shows sinus rhythm.   Left arm twitching with weakness He was thought to have seizure activity as a reason for his twitching. He was placed on Keppra. Twitching appears to have resolved. Unclear if the stroke detected on MRI is contributing or not. EEG did not show any epileptiform activity. Continue Keppra.  History of right ICA aneurysm status post stent placement Stable. He was apparently supposed to undergo an angiogram last week. This was held due to onset of seizures. But now due to acute stroke involving the right hemisphere, he will need further evaluation and so plan is for angiogram this afternoon. He was started back on his Plavix.    Essential Hypertension Monitor blood pressures  closely. Holding his diuretic due to hyponatremia and hypokalemia. Allow permissive hypertension. Will need alternative antihypertensive agent at discharge.  Hyponatremia and hypokalemia Likely due to diuretics. Cut back on IV fluid rate. Potassium is normal. Sodium level is stable. Will need continued monitoring as outpatient.   DVT Prophylaxis: Lovenox    Code Status: Full code  Family Communication: Discussed with his wife Disposition Plan: Angiogram today. Seen by physical therapy. Home health will need to be arranged at the time of discharge.     LOS: 5 days   [facility] Pager [phone] 01/02/2016, 8:27 AM  If 7PM-7AM, please contact night-coverage at [email or web address], password [facility]

## 2016-01-02 NOTE — Progress Notes (Signed)
OT Cancellation Note  Patient Details Name: Damon Parrish MRN: [id number] DOB: [birth year]   Cancelled Treatment:    Reason Eval/Treat Not Completed: Medical issues which prohibited therapy (bed rest after procedure). 2nd attempt today, pt on bedrest for an additional hour. Will attempt to see at a later time.  Redmond Baseman, OTR/L Pager: [phone] 01/02/2016, 3:37 PM

## 2016-01-02 NOTE — Op Note (Signed)
DIAGNOSTIC CEREBRAL ANGIOGRAM    OPERATOR:   Dr. Consuella Lose, MD  HISTORY:   The patient is a 75 y.o. yo male admitted with mild left sided weakness and focal motor SZ. He previously underwent placement of a RICA Pipeline stent in August 2016 for an incidentally discovered distal RICA aneurysm.  APPROACH:   The technical aspects of the procedure as well as its potential risks and benefits were reviewed with the patient. These risks included but were not limited bleeding, infection, allergic reaction, damage to organs/vital structures, stroke, non-diagnostic procedure, and the catastrophic outcomes of heart attack, coma, and death. With an understanding of these risks, informed consent was obtained and witnessed.    The patient was placed in the supine position on the angiography table and the skin of right groin prepped in the usual sterile fashion. The procedure was performed under local anesthesia (1%-solution of bicarbonate-bufferred Lidoacaine) and conscious sedation with Versed and fentanyl monitored by the in-suite nurse.    A 5- French sheath was introduced in the right common femoral artery using Seldinger technique.  A fluorophase sequence was used to document the sheath position.    HEPARIN: 2000 Units total.   CONTRAST AGENT: 40cc, Omnipaque 300   FLUOROSCOPY TIME: See IR records    CATHETER(S) AND WIRE(S):    5-French JB-1 glidecatheter   0.035" glidewire    VESSELS CATHETERIZED:   Right internal carotid   Left internal carotid   Left vertebral   Right common femoral  VESSELS STUDIED:   Right internal carotid, head Left internal carotid, head Left vertebral Right femoral  PROCEDURAL NARRATIVE:   A 5-Fr JB-1 terumo glide catheter was advanced over a 0.035 glidewire into the aortic arch. The above vessels were then sequentially catheterized and cervical/cerebral angiograms taken. After review of images, the catheter was removed without incident.     INTERPRETATION:   Right internal carotid: head:   Injection reveals the presence of a widely patent ICA, M1, and A1 segments and their branches. The pipeline device is seen extending to the supraclinoid internal carotid artery. There is no aneurysm filling seen. There is minimal stenosis at the proximal end of the device, without any flow limitation.  The parenchymal and venous phases are normal. The venous sinuses are widely patent.    Left internal carotid: head:   Injection reveals the presence of a widely patent ICA, A1, and M1 segments and their branches. There is a small proximal cavernous ICA aneurysm projecting inferiorly and laterally measuring 1.6 x 1.72mm. In review of prior angiograms, this aneurysm was present and is stable in appearance. The parenchymal and venous phases are normal. The venous sinuses are widely patent.    Left vertebral:   Injection reveals the presence of a widely patent vertebral artery. This leads to a widely patent basilar artery that terminates in bilateral P1. The basilar apex is normal. There is no significant stenosis, occlusion, aneurysm, or vascular malformation visualized. The parenchymal and venous phases are normal. The venous sinuses are widely patent.    Right femoral:    Normal vessel. No significant atherosclerotic disease. Arterial sheath in adequate position.   DISPOSITION:  Upon completion of the study, the femoral sheath was removed and hemostasis obtained using a 5-Fr ExoSeal closure device. Good proximal and distal lower extremity pulses were documented upon achievement of hemostasis.    The procedure was well tolerated and no early complications were observed.       The patient was transferred back to  the holding area to be positioned flat in bed for 3 hours of observation.    IMPRESSION:  1. 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 There is minimal proximal stenosis  without any flow limitation. 2. Small proximal left cavernous ICA aneurysm measures <43mm as described above. This is stable in comparison to prior angiograms.  The preliminary results of this procedure were shared with the patient and the patient's family.

## 2016-01-02 NOTE — Progress Notes (Signed)
Pt to remain NPO. Pt is currently on interventional radiology schedule with Dr. Kathyrn Sheriff. Unknown time of procedure due to emergency diagnostics. Damon Parrish

## 2016-01-02 NOTE — Progress Notes (Signed)
Pt was kept NPO after midnight for possible angiogram for today.  Will continue to monitor.  Fredrich Romans, RN

## 2016-01-02 NOTE — Progress Notes (Signed)
No issues overnight. Pt walking with PT, no current complaints  EXAM:  BP 175/96 mmHg  Pulse 73  Temp(Src) 97.9 F (36.6 C) (Oral)  Resp 18  Ht 5\' 8"  (1.727 m)  Wt 68.8 kg (151 lb 10.8 oz)  BMI 23.07 kg/m2  SpO2 97%  Awake, alert, oriented  Speech fluent, appropriate  CN grossly intact  Good strength BUE/BLE, left drift  IMPRESSION:  75 y.o. male with small right hemispheric strokes.   PLAN: - Will proceed with diagnostic angiogram to assess patency of the RICA Pipeline. CTA appears to demonstrate occlusion of the aneurysm.  Pt and wife understand the rationale for angiogram. Risks and benefits were reviewed, they are eager to proceed. All questions were answered.

## 2016-01-02 NOTE — Progress Notes (Signed)
OT Cancellation Note  Patient Details Name: Damon Parrish MRN: [id number] DOB: February 22, 1941   Cancelled Treatment:    Reason Eval/Treat Not Completed: Patient at procedure or test/ unavailable (Radiology)  Redmond Baseman, OTR/L Pager: [phone] 01/02/2016, 11:21 AM

## 2016-01-02 NOTE — Progress Notes (Signed)
Physical Therapy Treatment Patient Details Name: Damon Parrish MRN: [id number] DOB: 03/02/1941 Today's Date: 01/02/2016    History of Present Illness Damon Parrish is a 75 y.o. 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    PT Comments    Pt able to advance his ambulation to 220 feet with rw and no loss of balance. Pt and spouse report that they feel like he has made significant improvements. PT to continue to follow and progress mobility in anticipation of D/C to home with spouse to assist.   Follow Up Recommendations  Home health PT;Supervision/Assistance - 24 hour     Equipment Recommendations  3in1 (PT)    Recommendations for Other Services       Precautions / Restrictions Precautions Precautions: Fall Restrictions Weight Bearing Restrictions: No    Mobility  Bed Mobility Overal bed mobility: Needs Assistance Bed Mobility: Supine to Sit     Supine to sit: Modified independent (Device/Increase time)     General bed mobility comments: using rail to assist  Transfers Overall transfer level: Needs assistance Equipment used: Rolling walker (2 wheeled) Transfers: Sit to/from Stand Sit to Stand: Min guard         General transfer comment:  (min guard for safety. )  Ambulation/Gait Ambulation/Gait assistance: Supervision Ambulation Distance (Feet): 220 Feet Assistive device: Rolling walker (2 wheeled) Gait Pattern/deviations: Step-through pattern Gait velocity: decreased   General Gait Details: no loss of balance during ambulation   Stairs            Wheelchair Mobility    Modified Rankin (Stroke Patients Only)       Balance Overall balance assessment: Needs assistance Sitting-balance support: No upper extremity supported Sitting  balance-Leahy Scale: Good     Standing balance support: During functional activity Standing balance-Leahy Scale: Fair Standing balance comment: able to stand at EOB without assistance briefly.                     Cognition Arousal/Alertness: Awake/alert Behavior During Therapy: WFL for tasks assessed/performed Overall Cognitive Status: Within Functional Limits for tasks assessed                      Exercises      General Comments        Pertinent Vitals/Pain Pain Assessment: No/denies pain    Home Living                      Prior Function            PT Goals (current goals can now be found in the care plan section) Acute Rehab PT Goals Patient Stated Goal: Wanting to get home. PT Goal Formulation: With patient Time For Goal Achievement: 01/14/16 Potential to Achieve Goals: Good Progress towards PT goals: Progressing toward goals    Frequency  Min 4X/week    PT Plan Current plan remains appropriate    Co-evaluation             End of Session Equipment Utilized During Treatment: Gait belt Activity Tolerance: Patient tolerated treatment well Patient left: in bed;with call bell/phone within reach;with family/visitor present;with bed alarm set     Time: [id number] PT Time Calculation (min) (ACUTE ONLY): 22 min  Charges:  $Gait Training: 8-22 mins  G Codes:      Cassell Clement, PT, CSCS Pager [phone] Office [phone]  01/02/2016, 2:59 PM

## 2016-01-03 DIAGNOSIS — I63511 Cerebral infarction due to unspecified occlusion or stenosis of right middle cerebral artery: Secondary | ICD-10-CM

## 2016-01-03 DIAGNOSIS — I639 Cerebral infarction, unspecified: Secondary | ICD-10-CM

## 2016-01-03 LAB — CBC
HEMATOCRIT: 39 % (ref 39.0–52.0)
Hemoglobin: 13.8 g/dL (ref 13.0–17.0)
MCH: 33.7 pg (ref 26.0–34.0)
MCHC: 35.4 g/dL (ref 30.0–36.0)
MCV: 95.1 fL (ref 78.0–100.0)
Platelets: 238 10*3/uL (ref 150–400)
RBC: 4.1 MIL/uL — ABNORMAL LOW (ref 4.22–5.81)
RDW: 12.5 % (ref 11.5–15.5)
WBC: 7.9 10*3/uL (ref 4.0–10.5)

## 2016-01-03 LAB — BASIC METABOLIC PANEL
ANION GAP: 11 (ref 5–15)
BUN: 12 mg/dL (ref 6–20)
CO2: 22 mmol/L (ref 22–32)
Calcium: 9.1 mg/dL (ref 8.9–10.3)
Chloride: 99 mmol/L — ABNORMAL LOW (ref 101–111)
Creatinine, Ser: 0.81 mg/dL (ref 0.61–1.24)
GFR calc Af Amer: >60 mL/min (ref >60)
Glucose, Bld: 96 mg/dL (ref 65–99)
Potassium: 3.6 mmol/L (ref 3.5–5.1)
Sodium: 132 mmol/L — ABNORMAL LOW (ref 135–145)

## 2016-01-03 MED ORDER — OXYCODONE-ACETAMINOPHEN 5-325 MG PO TABS: 1.0000 | ORAL_TABLET | ORAL | Status: DC | PRN

## 2016-01-03 MED ORDER — LEVETIRACETAM 750 MG PO TABS: 1500.0000 mg | ORAL_TABLET | Freq: Two times a day (BID) | ORAL | Status: DC

## 2016-01-03 NOTE — Progress Notes (Signed)
Occupational Therapy Treatment Patient Details Name: Damon Parrish MRN: [id number] DOB: 1940-12-14 Today's Date: 01/03/2016    History of present illness Damon Parrish is a 75 y.o. 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   OT comments  Pt progressing. Education provided in session. Will continue to follow acutely.  Follow Up Recommendations  Home health OT;Supervision/Assistance - 24 hour    Equipment Recommendations  None recommended by OT    Recommendations for Other Services      Precautions / Restrictions Precautions Precautions: Fall Restrictions Weight Bearing Restrictions: No       Mobility Bed Mobility Overal bed mobility: Needs assistance Bed Mobility: Supine to Sit; Sit to Supine     Supine to sit: Supervision Sit to supine: Modified independent (Device/Increase time)    Transfers Overall transfer level: Needs assistance Transfers: Sit to/from Stand Sit to Stand: Supervision         General transfer comment: RW in front of pt-set up for RW.    Balance    No LOB in session.            ADL Overall ADL's : Needs assistance/impaired         Upper Body Bathing: Min guard;Standing Upper Body Bathing Details (indicate cue type and reason): pt went and retrieved items at [facility] guard level; Stood at sink with supervision; washed armpits Lower Body Bathing: Min guard (standing-washed bottom) Lower Body Bathing Details (indicate cue type and reason): pt went and retrieved items at [facility] guard level; Stood at sink with supervision   Upper Body Dressing Details (indicate cue type and reason): OT helped managed gown so pt could wash armpits and bottom     Toilet Transfer: Min guard;Ambulation;RW (sit to stand from bed)           Functional  mobility during ADLs: Min guard;Rolling walker General ADL Comments: Educated on safety such as sitting for LB ADLs, bag on walker, recommended someone be with him for shower transfer and bathing. Discussed recommendation of someone being with pt 24/7.      Vision                     Perception     Praxis      Cognition  Awake/Alert Behavior During Therapy: WFL for tasks assessed/performed Overall Cognitive Status:  (unsure of baseline) Area of Impairment: Memory;Problem solving     Memory: Decreased short-term memory      Problem Solving: Slow processing;Requires verbal cues General Comments: cues for positioning of pt and walker when going to retrieve ADL items from cabinet.    Extremity/Trunk Assessment               Exercises Other Exercises Other Exercises: educated on theraband exercises for LUE and gave pt level 2 theraband; Pt performed shoulder flexion exercise   Shoulder Instructions       General Comments      Pertinent Vitals/ Pain       Pain Assessment: No/denies pain  Home Living                                          Prior Functioning/Environment  Frequency Min 2X/week     Progress Toward Goals  OT Goals(current goals can now be found in the care plan section)  Progress towards OT goals: Progressing toward goals-also added a couple goals  Acute Rehab OT Goals Patient Stated Goal: go home OT Goal Formulation: With patient Time For Goal Achievement: 01/14/16 Potential to Achieve Goals: Good ADL Goals Pt Will Perform Upper Body Bathing: standing;with modified independence Pt Will Perform Lower Body Bathing: sit to/from stand;with modified independence Pt Will Transfer to Toilet: with modified independence;ambulating;regular height toilet Pt Will Perform Toileting - Clothing Manipulation and hygiene: with modified independence;sit to/from stand;sitting/lateral leans Pt/caregiver will Perform  Home Exercise Program: Increased strength;Left upper extremity;With theraband;Independently;With written HEP provided Additional ADL Goal #1: Pt will verbalize 2 home safety strategies to increase safety with ADLs and/or mobility at home.-met goal Additional ADL Goal #2: Pt will go and retrieve ADL items at Mod I level.  Plan Discharge plan needs to be updated    Co-evaluation                 End of Session Equipment Utilized During Treatment: Gait belt;Rolling walker   Activity Tolerance Patient tolerated treatment well   Patient Left with family/visitor present;in bed;with call bell/phone within reach   Nurse Communication Other (comment) (talked with nurse and pt not on bed rest.)        Time: 0949-1006 OT Time Calculation (min): 17 min  Charges: OT General Charges $OT Visit: 1 Procedure OT Treatments $Self Care/Home Management : 8-22 mins  Benito Mccreedy OTR/L [phone] 01/03/2016, 10:25 AM

## 2016-01-03 NOTE — Care Management Note (Signed)
Case Management Note  Patient Details  Name: AZAIAH LICCIARDI MRN: [id number] Date of Birth: 1941/06/06  Subjective/Objective:                    Action/Plan: Patient discharging home with [facility] services. CM met with the patient and his wife and provided them a list of [place] agencies in the [place] area. They selected [place]. They do not want the [facility] aide.  Colletta Maryland with [facility] notified and accepted the referral. Bedside RN updated.   Expected Discharge Date:                  Expected Discharge Plan:  [place]  In-House Referral:     Discharge planning Services  CM Consult  Post Acute Care Choice:  Home Health Choice offered to:  Patient, Spouse  DME Arranged:    DME Agency:     HH Arranged:  RN, PT, Nurse's Aide [place] Agency:     Status of Service:  Completed, signed off  Medicare Important Message Given:  Yes Date Medicare IM Given:    Medicare IM give by:    Date Additional Medicare IM Given:    Additional Medicare Important Message give by:     If discussed at [place] of Stay Meetings, dates discussed:    Additional Comments:  Pollie Friar, RN 01/03/2016, 3:42 PM

## 2016-01-03 NOTE — Progress Notes (Signed)
Pt for discharge home today. Discharge orders received. IV and telemetry dcd with dressing clean dry and intact. Discharge instructions and 1 prescription given with verbalized understanding. Handouts re: stroke prevention given. Wife at bedside to assist with discharge. Staff brought patient to lobby via wheelchair at 1645. Transported to home by spouse.

## 2016-01-03 NOTE — Discharge Summary (Addendum)
Physician Discharge Summary  Damon Parrish [id number] DOB: [birth year] DOA: 12/28/2015  PCP: Tobe Sos, MD  Admit date: 12/28/2015 Discharge date: 01/03/2016  Recommendations for Outpatient Follow-up:   Pt will need to follow up with PCP in 1-2 weeks post discharge  Please obtain BMP to evaluate electrolytes and kidney function  Please also check CBC to evaluate Hg and Hct levels  Pt started on Keppra per neurology team  Pt also to see Dr. 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 Patient has been on Plavix. However, he stopped it about a week ago as the plan was for him to undergo angiography of the carotid system sometime this week. Patient was hospitalized for further management. Patient was seen by neurology. Patient underwent MRI which revealed stroke.   Hospital Course:  Left MCA/ACA watershed infarct possibly embolic vs watershed infarct - Resultant Subtle left arm weakness  - MRI Patchy acute ischemia RIGHT frontoparietal lobes in MCA/ACA territory infarct. - CTA of head and neck - unremarkable - 2D Echo - EF 55-60% - LE venous doppler negative for DVT - angio showed minimal ICA stenosis at the site of pipeline stent - EEG - unremarkable - continue aspirin and plavix upon discharge  - HH PT and OT recommended upon discharge   ? Seizure  - continue  with Keppra   Right tICA aneurysm s/p stent - Repeat Angio showed pipeline stent patency with minimal stenosis - Continue plavix for now - Follow up with Dr. Kathyrn Sheriff  Hypertension, essential  - continue with home medical regimen   Hyperlipidemia - statin upon discharge    Procedures/Studies: Ct Angio Head W/cm &/or Wo Cm  12/30/2015  CLINICAL DATA:  Cerebral aneurysm repair and seizure. Stroke. Left upper extremity tremors and weakness. EXAM: CT ANGIOGRAPHY HEAD AND NECK TECHNIQUE: Multidetector CT imaging of the head and neck was performed using the standard protocol during bolus administration of intravenous contrast. Multiplanar CT image reconstructions and MIPs were obtained to evaluate the vascular anatomy. Carotid stenosis measurements (when applicable) are obtained utilizing NASCET criteria, using the distal internal carotid diameter as the denominator. CONTRAST:  50 mL Isovue 370 COMPARISON:  MRI brain 12/30/2015 FINDINGS: CT HEAD Brain: Chronic extra-axial collections are again noted bilaterally, right greater than left. The right coronal radiata infarcts are not visible by CT. No acute cortical infarct, hemorrhage, or mass lesion is present. The ventricles are of normal size. Remote lacunar infarcts of the right basal ganglia are again noted. Calvarium and skull base: Within normal limits. Paranasal sinuses: Mild mucosal thickening is present within the anterior ethmoid air cells and bilateral maxillary sinuses. The remaining paranasal sinuses and the mastoid air cells are clear. Orbits: A focal calcification is present anteriorly in the right globe. The left globe is intact. The orbits are within normal limits bilaterally. CTA NECK Aortic arch: A 3 vessel arch configuration is present. Atherosclerotic calcifications are present without aneurysm or  stenosis. Right carotid system: The right common carotid artery is within normal limits. The bifurcation is unremarkable. Cervical right ICA is  normal. Left carotid system: The left common carotid artery is within normal limits. The bifurcation is unremarkable. The cervical left ICA demonstrates mild distal tortuosity without focal stenosis. Vertebral arteries:The vertebral arteries originate from the subclavian arteries bilaterally without focal stenosis. There is mild irregularity of both vessels as they enter the spinal canal. There is no significant stenosis relative to the more distal vessel. Skeleton: Multilevel endplate degenerative changes are present throughout the cervical and upper thoracic spine. Endplate sclerotic changes and uncovertebral spurring is present at C4-5 with bilateral stenosis. There is fusion across the joint space at C5-6 and likely at C6-7. Endplate sclerotic changes are noted in the upper cervical spine. Alignment is maintained. Vertebral body heights are normal. No focal lytic or blastic lesions are present. Other neck: No focal mucosal or submucosal lesions are present. The tongue base is within normal limits. The vocal cords are midline and symmetric. The thyroid is within normal limits. No significant cervical adenopathy is present. The salivary glands are within normal limits bilaterally. Centrilobular emphysematous changes are noted at the lung apices bilaterally. No focal nodule, mass, or airspace disease is present. Biapical pleural parenchymal scarring is noted. CTA HEAD Anterior circulation: Atherosclerotic changes are present within the cavernous internal carotid arteries bilaterally. There is a stent across the right ophthalmic and supraclinoid segment compatible with a pipeline stent placed for treatment of a a right internal carotid artery aneurysm. The lumen is patent through the stent. Mild atherosclerotic narrowing is present in the left ophthalmic segment of less than 50%. The left ICA terminus is intact. The A1 and M1 segments are normal bilaterally. The MCA bifurcations are intact. Mild segmental  irregularity is present throughout the MCA and distal ACA branch vessels. There is no significant proximal stenosis or occlusion. Posterior circulation: Right vertebral artery is the dominant vessel. The PICA origins are visualized and normal. The vertebrobasilar junction is normal. The basilar artery is within normal limits. Both posterior cerebral arteries originate from the basilar tip. The PCA branch vessels are within normal limits bilaterally. Venous sinuses: The dural sinuses are patent. The right transverse sinus is dominant. Straight sinus and deep cerebral veins are patent. The cortical veins are intact. Anatomic variants: None Delayed phase: The postcontrast images demonstrate no pathologic enhancement. IMPRESSION: 1. Right ICA stent appears to be patent. Mild distal small vessel disease bilaterally. 2. No significant proximal stenosis, aneurysm, or branch vessel occlusion. 3. The right white matter infarcts are not visible by CT. 4. Multilevel spondylosis of the cervical spine. 5. Centrilobular emphysema. Electronically Signed   By: San Morelle M.D.   On: 12/30/2015 21:26   Ct Head Wo Contrast  12/28/2015  CLINICAL DATA:  History of aneurysm.  Uncontrolled tremors. EXAM: CT HEAD WITHOUT CONTRAST TECHNIQUE: Contiguous axial images were obtained from the base of the skull through the vertex without intravenous contrast. COMPARISON:  Brain MRI 01/31/2011 FINDINGS: Skull and Sinuses:No fracture destructive process. Mild scattered mucosal thickening and secretion retention in the paranasal sinuses. 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 Cataract resection on the right. Brain: No evidence of acute infarction, hemorrhage, hydrocephalus, or mass lesion/mass effect. Extra-axial CSF accumulation along the cerebral convexities, slightly greater on the right, stable from 2012. Vessels appeared traversed these CSF, arguing against hygroma.  Stable cerebral volume. Normal white matter appearance. Intracranial Right ICA aneurysm  treatment with pipeline stent. IMPRESSION: 1. No acute finding. 2. Treated right ICA aneurysm.  Otherwise, stable from 2012 Electronically Signed   By: Monte Fantasia M.D.   On: 12/28/2015 11:11   Ct Angio Neck W/cm &/or Wo/cm  12/30/2015  CLINICAL DATA:  Cerebral aneurysm repair and seizure. Stroke. Left upper extremity tremors and weakness. EXAM: CT ANGIOGRAPHY HEAD AND NECK TECHNIQUE: Multidetector CT imaging of the head and neck was performed using the standard protocol during bolus administration of intravenous contrast. Multiplanar CT image reconstructions and MIPs were obtained to evaluate the vascular anatomy. Carotid stenosis measurements (when applicable) are obtained utilizing NASCET criteria, using the distal internal carotid diameter as the denominator. CONTRAST:  50 mL Isovue 370 COMPARISON:  MRI brain 12/30/2015 FINDINGS: CT HEAD Brain: Chronic extra-axial collections are again noted bilaterally, right greater than left. The right coronal radiata infarcts are not visible by CT. No acute cortical infarct, hemorrhage, or mass lesion is present. The ventricles are of normal size. Remote lacunar infarcts of the right basal ganglia are again noted. Calvarium and skull base: Within normal limits. Paranasal sinuses: Mild mucosal thickening is present within the anterior ethmoid air cells and bilateral maxillary sinuses. The remaining paranasal sinuses and the mastoid air cells are clear. Orbits: A focal calcification is present anteriorly in the right globe. The left globe is intact. The orbits are within normal limits bilaterally. CTA NECK Aortic arch: A 3 vessel arch configuration is present. Atherosclerotic calcifications are present without aneurysm or stenosis. Right carotid system: The right common carotid artery is within normal limits. The bifurcation is unremarkable. Cervical right ICA is normal. Left carotid  system: The left common carotid artery is within normal limits. The bifurcation is unremarkable. The cervical left ICA demonstrates mild distal tortuosity without focal stenosis. Vertebral arteries:The vertebral arteries originate from the subclavian arteries bilaterally without focal stenosis. There is mild irregularity of both vessels as they enter the spinal canal. There is no significant stenosis relative to the more distal vessel. Skeleton: Multilevel endplate degenerative changes are present throughout the cervical and upper thoracic spine. Endplate sclerotic changes and uncovertebral spurring is present at C4-5 with bilateral stenosis. There is fusion across the joint space at C5-6 and likely at C6-7. Endplate sclerotic changes are noted in the upper cervical spine. Alignment is maintained. Vertebral body heights are normal. No focal lytic or blastic lesions are present. Other neck: No focal mucosal or submucosal lesions are present. The tongue base is within normal limits. The vocal cords are midline and symmetric. The thyroid is within normal limits. No significant cervical adenopathy is present. The salivary glands are within normal limits bilaterally. Centrilobular emphysematous changes are noted at the lung apices bilaterally. No focal nodule, mass, or airspace disease is present. Biapical pleural parenchymal scarring is noted. CTA HEAD Anterior circulation: Atherosclerotic changes are present within the cavernous internal carotid arteries bilaterally. There is a stent across the right ophthalmic and supraclinoid segment compatible with a pipeline stent placed for treatment of a a right internal carotid artery aneurysm. The lumen is patent through the stent. Mild atherosclerotic narrowing is present in the left ophthalmic segment of less than 50%. The left ICA terminus is intact. The A1 and M1 segments are normal bilaterally. The MCA bifurcations are intact. Mild segmental irregularity is present  throughout the MCA and distal ACA branch vessels. There is no significant proximal stenosis or occlusion. Posterior circulation: Right vertebral artery is the dominant vessel. The PICA origins are visualized and normal. The  vertebrobasilar junction is normal. The basilar artery is within normal limits. Both posterior cerebral arteries originate from the basilar tip. The PCA branch vessels are within normal limits bilaterally. Venous sinuses: The dural sinuses are patent. The right transverse sinus is dominant. Straight sinus and deep cerebral veins are patent. The cortical veins are intact. Anatomic variants: None Delayed phase: The postcontrast images demonstrate no pathologic enhancement. IMPRESSION: 1. Right ICA stent appears to be patent. Mild distal small vessel disease bilaterally. 2. No significant proximal stenosis, aneurysm, or branch vessel occlusion. 3. The right white matter infarcts are not visible by CT. 4. Multilevel spondylosis of the cervical spine. 5. Centrilobular emphysema. Electronically Signed   By: San Morelle M.D.   On: 12/30/2015 21:26   [name] [id number] Contrast  12/30/2015  CLINICAL DATA:  Intermittent seizures, persistent LEFT upper extremity weakness may be postictal. History of hypertension, cerebral aneurysm, seizures. EXAM: MRI HEAD WITHOUT AND WITH CONTRAST TECHNIQUE: Multiplanar, multiecho pulse sequences of the brain and surrounding structures were obtained without and with intravenous contrast. CONTRAST:  39mL MULTIHANCE GADOBENATE DIMEGLUMINE 529 MG/ML IV SOLN COMPARISON:  CT head December 28, 2015 and MRI head Jan 31, 2011 FINDINGS: Multiple sequences are moderately motion degraded. Patchy subcentimeter reduced diffusion RIGHT frontal and parietal lobes, in addition to linear precentral gyrus reduced diffusion. Due to small size and motion, limited assessment by ADC values though identifiable lesion does demonstrate low ADC values. Susceptibility artifact RIGHT carotid  siphon associated with treated aneurysm and stent. No susceptibility artifact to suggest hemorrhage. Ventricles and sulci are normal for patient's age. No midline shift or mass effect. No definite masses. Minimal white matter changes compatible chronic small vessel ischemic disease are less than expected for age. No definite abnormal intracranial enhancement though motion degrades sensitivity for subtle potential enhancement. Stable LEFT and slightly larger 9 mm RIGHT frontal parietal T2 bright extra-axial fluid spaces. No extra-axial masses nor leptomeningeal enhancement. Normal major intracranial vascular flow voids seen at the skull base. Status post RIGHT ocular lens implant with shunt in the anterior chamber. No suspicious calvarial bone marrow signal. No abnormal sellar expansion. Craniocervical junction maintained. Mild paranasal sinus mucosal thickening with small LEFT frontal mucosal retention cyst. Small RIGHT mastoid effusion. IMPRESSION: Patchy acute ischemia 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 Involutional changes and minimal chronic small vessel ischemic disease. 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 Electronically Signed   By: Elon Alas M.D.   On: 12/30/2015 03:16   Discharge Exam: Filed Vitals:   01/03/16 1030 01/03/16 1447  BP: 151/81 169/91  Pulse: 76 69  Temp: 97.9 F (36.6 C) 98.3 F (36.8 C)  Resp:  18   Filed Vitals:   01/03/16 0531 01/03/16 0754 01/03/16 1030 01/03/16 1447  BP: 181/95 139/86 151/81 169/91  Pulse: 77  76 69  Temp: 98.2 F (36.8 C)  97.9 F (36.6 C) 98.3 F (36.8 C)  TempSrc: Oral  Oral Oral  Resp: 16   18  Height:      Weight:      SpO2: 97%  97% 98%    General: Pt is alert, follows commands appropriately, not in acute distress Cardiovascular: Regular rate and rhythm, no rubs, no  gallops Respiratory: Clear to auscultation bilaterally, no wheezing, no crackles, no rhonchi Abdominal: Soft, non tender, non distended, bowel sounds +, no guarding  Discharge Instructions  Discharge  Instructions    Ambulatory referral to Neurology    Complete by:  As directed   Pt will follow up with Dr. Erlinda Hong at [facility] in about 2 months. Thanks.     Diet - low sodium heart healthy    Complete by:  As directed      Increase activity slowly    Complete by:  As directed             Medication List    TAKE these medications        aspirin 325 MG tablet  Take 325 mg by mouth daily.     atorvastatin 40 MG tablet  Commonly known as:  LIPITOR  Take 40 mg by mouth every other day. At bedtime     beta carotene w/minerals tablet  Take 1 tablet by mouth daily.     cholecalciferol 1000 units tablet  Commonly known as:  VITAMIN D  Take 1,000 Units by mouth daily.     clopidogrel 75 MG tablet  Commonly known as:  PLAVIX  Take 75 mg by mouth daily.     FISH OIL PO  Take 1 capsule by mouth daily.     fluticasone 50 MCG/ACT nasal spray  Commonly known as:  FLONASE  Place 1 spray into both nostrils daily.     hydrochlorothiazide 25 MG tablet  Commonly known as:  HYDRODIURIL  Take 25 mg by mouth daily.     lansoprazole 15 MG capsule  Commonly known as:  PREVACID  Take 15 mg by mouth daily.     levETIRAcetam 750 MG tablet  Commonly known as:  KEPPRA  Take 2 tablets (1,500 mg total) by mouth 2 (two) times daily.     multivitamin with minerals Tabs tablet  Take 1 tablet by mouth daily. Centrum Silver     SIMBRINZA 1-0.2 % Susp  Generic drug:  Brinzolamide-Brimonidine  Place 1 drop into both eyes every evening. Morning and 8pm     TRAVATAN Z 0.004 % Soln ophthalmic solution  Generic drug:  Travoprost (BAK Free)  Place 1 drop into both eyes 2 (two) times daily.     vitamin C 1000 MG tablet  Take 1,000 mg by mouth daily.     vitamin E 400 UNIT capsule  Take 400 Units by  mouth daily.            Follow-up Information    Follow up with Xu,Jindong, MD. Schedule an appointment as soon as possible for a visit in 2 months.   Specialty:  Neurology   Why:  stroke clinic   Contact information:   [address] [place] [id number] [phone]       Follow up with Tobe Sos, MD.   Specialty:  Internal Medicine   Contact information:   [address], [place] [address] [phone]       Call Faye Ramsay, MD.   Specialty:  Internal Medicine   Why:  As needed call my cell phone [phone]   Contact information:   [address] [place] [address] [phone]        The results of significant diagnostics from this hospitalization (including imaging, microbiology, ancillary and laboratory) are listed below for reference.     Microbiology: Recent Results (from the past 240 hour(s))  MRSA PCR Screening     Status: None   Collection Time: 12/28/15  6:37 PM  Result Value Ref Range Status   MRSA by  PCR NEGATIVE NEGATIVE Final    Comment:        The GeneXpert MRSA Assay (FDA approved for NASAL specimens only), is one component of a comprehensive MRSA colonization surveillance program. It is not intended to diagnose MRSA infection nor to guide or monitor treatment for MRSA infections.      Labs: Basic Metabolic Panel:  Recent Labs Lab 12/28/15 0905 12/29/15 0330 12/30/15 0555 12/31/15 0519 01/03/16 0605  NA 129* 132* 131* 132* 132*  K 3.5 3.2* 3.8 3.6 3.6  CL 94* 99* 100* 100* 99*  CO2 21* 22 20* 22 22  GLUCOSE 108* 97 85 88 96  BUN 10 7 7 7 12   CREATININE 0.84 0.75 0.67 0.81 0.81  CALCIUM 9.5 8.8* 8.8* 8.9 9.1   Liver Function Tests:  Recent Labs Lab 12/28/15 0905 12/29/15 0330  AST 26 22  ALT 19 18  ALKPHOS 99 88  BILITOT 0.7 0.7  PROT 7.3 6.4*  ALBUMIN 4.2 3.4*   CBC:  Recent Labs Lab 12/28/15 0905 12/29/15 0330 12/30/15 0555 01/03/16 0605   WBC 7.9 6.9 7.0 7.9  NEUTROABS 4.8  --   --   --   HGB 15.0 13.6 14.1 13.8  HCT 42.8 39.8 41.1 39.0  MCV 94.5 95.0 95.4 95.1  PLT 269 235 247 238   SIGNED: Time coordinating discharge: 30 minutes  MAGICK-Piers Baade, MD  [facility] 01/03/2016, 3:32 PM Pager [phone]  If 7PM-7AM, please contact night-coverage [email or web address] Password [email or web address]

## 2016-01-03 NOTE — Progress Notes (Signed)
Physical Therapy Treatment Patient Details Name: Damon Parrish MRN: [id number] DOB: Apr 22, 1941 Today's Date: 01/03/2016    History of Present Illness [name] is a 75 y.o. 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    PT Comments    Pt ambulated 400' with RW and supervision, pt continues to require supervision for safety due to continued impulsive behavior. Performed stairs today and required min A due to decreased strength and balance. PT will continue to follow.   Follow Up Recommendations  Home health PT;Supervision/Assistance - 24 hour     Equipment Recommendations  3in1 (PT)    Recommendations for Other Services OT consult     Precautions / Restrictions Precautions Precautions: Fall Restrictions Weight Bearing Restrictions: No    Mobility  Bed Mobility Overal bed mobility: Modified Independent Bed Mobility: Supine to Sit     Supine to sit: Modified independent (Device/Increase time)     General bed mobility comments: no assistance needed  Transfers Overall transfer level: Needs assistance Equipment used: Rolling walker (2 wheeled) Transfers: Sit to/from Stand Sit to Stand: Supervision         General transfer comment: pt uses momentum to stand, reaches for RW and grabs it as he begins to stand, no LOB but impulsive with mvmt so supervision given (min guard for safety. )  Ambulation/Gait Ambulation/Gait assistance: Supervision Ambulation Distance (Feet): 400 Feet Assistive device: Rolling walker (2 wheeled) Gait Pattern/deviations: Step-through pattern Gait velocity: decreased Gait velocity interpretation: Below normal speed for age/gender General Gait Details: no loss of balance during ambulation, worked on changing speed   Stairs Stairs:  Yes Stairs assistance: Min assist Stair Management: One rail Right;Step to pattern;Backwards Number of Stairs: 10 General stair comments: pt able to ascend first 5 stairs with only rail on right, second five he required HHA on left and had noted strength deficits bilateral LE's. Pt acknowledges that he has an old left foot injury that affects strength on that side. Needed min A for safety with descending stairs, 1 LOB  Wheelchair Mobility    Modified Rankin (Stroke Patients Only) Modified Rankin (Stroke Patients Only) Pre-Morbid Rankin Score: No symptoms Modified Rankin: Slight disability     Balance Overall balance assessment: Needs assistance Sitting-balance support: No upper extremity supported Sitting balance-Leahy Scale: Good     Standing balance support: No upper extremity supported Standing balance-Leahy Scale: Fair Standing balance comment: can maintain static stance withuout UE support with supervision, but needs UE support for safety with dynamic activity                    Cognition Arousal/Alertness: Awake/alert Behavior During Therapy: WFL for tasks assessed/performed Overall Cognitive Status: Within Functional Limits for tasks assessed           Safety/Judgement: Decreased awareness of deficits     General Comments: pt consistently using RW and can verbalize why though remains impulsive at times    Exercises      General Comments        Pertinent Vitals/Pain Pain Assessment: No/denies pain    Home Living                      Prior Function            PT Goals (current goals can  now be found in the care plan section) Acute Rehab PT Goals Patient Stated Goal: Wanting to get home. PT Goal Formulation: With patient Time For Goal Achievement: 01/14/16 Potential to Achieve Goals: Good Progress towards PT goals: Progressing toward goals    Frequency  Min 4X/week    PT Plan Current plan remains appropriate    Co-evaluation              End of Session   Activity Tolerance: Patient tolerated treatment well Patient left: with call bell/phone within reach;with family/visitor present;in chair     Time: [id number] PT Time Calculation (min) (ACUTE ONLY): 20 min  Charges:  $Gait Training: 8-22 mins                    G Codes:     Leighton Roach, PT  Acute Rehab Services  [phone]  Leighton Roach 01/03/2016, 9:46 AM

## 2016-01-03 NOTE — Discharge Instructions (Signed)
Stroke Prevention Some medical conditions and behaviors are associated with an increased chance of having a stroke. You may prevent a stroke by making healthy choices and managing medical conditions. HOW CAN I REDUCE MY RISK OF HAVING A STROKE?   Stay physically active. Get at least 30 minutes of activity on most or all days.  Do not smoke. It may also be helpful to avoid exposure to secondhand smoke.  Limit alcohol use. Moderate alcohol use is considered to be:  No more than 2 drinks per day for men.  No more than 1 drink per day for nonpregnant women.  Eat healthy foods. This involves:  Eating 5 or more servings of fruits and vegetables a day.  Making dietary changes that address high blood pressure (hypertension), high cholesterol, diabetes, or obesity.  Manage your cholesterol levels.  Making food choices that are high in fiber and low in saturated fat, trans fat, and cholesterol may control cholesterol levels.  Take any prescribed medicines to control cholesterol as directed by your health care provider.  Manage your diabetes.  Controlling your carbohydrate and sugar intake is recommended to manage diabetes.  Take any prescribed medicines to control diabetes as directed by your health care provider.  Control your hypertension.  Making food choices that are low in salt (sodium), saturated fat, trans fat, and cholesterol is recommended to manage hypertension.  Ask your health care provider if you need treatment to lower your blood pressure. Take any prescribed medicines to control hypertension as directed by your health care provider.  If you are 18-39 years of age, have your blood pressure checked every 3-5 years. If you are 40 years of age or older, have your blood pressure checked every year.  Maintain a healthy weight.  Reducing calorie intake and making food choices that are low in sodium, saturated fat, trans fat, and cholesterol are recommended to manage  weight.  Stop drug abuse.  Avoid taking birth control pills.  Talk to your health care provider about the risks of taking birth control pills if you are over 35 years old, smoke, get migraines, or have ever had a blood clot.  Get evaluated for sleep disorders (sleep apnea).  Talk to your health care provider about getting a sleep evaluation if you snore a lot or have excessive sleepiness.  Take medicines only as directed by your health care provider.  For some people, aspirin or blood thinners (anticoagulants) are helpful in reducing the risk of forming abnormal blood clots that can lead to stroke. If you have the irregular heart rhythm of atrial fibrillation, you should be on a blood thinner unless there is a good reason you cannot take them.  Understand all your medicine instructions.  Make sure that other conditions (such as anemia or atherosclerosis) are addressed. SEEK IMMEDIATE MEDICAL CARE IF:   You have sudden weakness or numbness of the face, arm, or leg, especially on one side of the body.  Your face or eyelid droops to one side.  You have sudden confusion.  You have trouble speaking (aphasia) or understanding.  You have sudden trouble seeing in one or both eyes.  You have sudden trouble walking.  You have dizziness.  You have a loss of balance or coordination.  You have a sudden, severe headache with no known cause.  You have new chest pain or an irregular heartbeat. Any of these symptoms may represent a serious problem that is an emergency. Do not wait to see if the symptoms will   go away. Get medical help at once. Call your local emergency services (911 in [place] Do not drive yourself to the hospital.   This information is not intended to replace advice given to you by your health care provider. Make sure you discuss any questions you have with your health care provider.   Document Released: 10/17/2004 Document Revised: 09/30/2014 Document Reviewed:  03/12/2013 Elsevier Interactive Patient Education 2016 Elsevier Inc.  

## 2016-01-05 ENCOUNTER — Telehealth: Payer: Self-pay | Admitting: Neurology

## 2016-01-05 DIAGNOSIS — R002 Palpitations: Secondary | ICD-10-CM | POA: Insufficient documentation

## 2016-01-05 NOTE — Telephone Encounter (Signed)
This should have been arranged by hospitalist on discharge from hospital. If that has been arranged, someone from cardiology department will call her and pt for setting it up. They should receive the call shortly.   Anyways, I will put in a new order just in case the cardiac monitoring has not been arranged.   Rosalin Hawking, MD PhD Stroke Neurology 01/05/2016 2:23 PM  Orders Placed This Encounter  Procedures  . Cardiac event monitor    Standing Status: Future     Number of Occurrences:      Standing Expiration Date: 01/05/2017    Scheduling Instructions:     Request cardionet setup. Thank you.    Order Specific Question:  Where should this test be performed?    Answer:  [facility]

## 2016-01-05 NOTE — Telephone Encounter (Signed)
Patient's wife is calling. She states the patient was told by Dr. Erlinda Hong in the hospital that he was to follow up with a heart monitor. She wants to know if someone will call the patient for the heart monitor or does the patient need to call someone? Please call and advise. Thank you.

## 2016-01-08 ENCOUNTER — Other Ambulatory Visit: Payer: Self-pay | Admitting: *Deleted

## 2016-01-08 NOTE — Patient Outreach (Signed)
[facility]amon Parrish [facility]) Care Management  01/08/2016  Damon Parrish 07-11-1941 [id number]  Subjective: Telephone call to patient's home number, no answer, left HIPAA compliant voice mail message, and requested call back.  Objective: Per chart review: Patient hospitalized 12/28/15 - 01/03/16 with the following diagnoses: Focal seizure, Essential hypertension, Hyponatremia, Acute CVA (cerebrovascular accident), Acute right MCA stroke, Aneurysm, cerebral, nonruptured and  HLD (hyperlipidemia).   Patient set up to receive homecare services through [place] post hospital discharge.  Assessment: Received EMMI Stroke Red flag referral on 01/08/16.  Report date: 01/08/16.   Trigger date :  01/07/16.  Day #3. Red flag trigger: Patient answered yes to feeling worse overall.   EMMI Stroke follow up pending patient contact.  Plan: RNCM will call patient within 1 week for EMMI Stroke follow up, if no return call from patient.  Damon Parrish, BSN, [place] Management [facility] Telephonic CM Phone: [phone] Fax: [phone]

## 2016-01-09 ENCOUNTER — Other Ambulatory Visit: Payer: Self-pay | Admitting: *Deleted

## 2016-01-09 NOTE — Patient Outreach (Addendum)
[place] [facility]) Care Management  01/09/2016  AZAR VECCHIARELLI 05/14/1941 [id number]  Subjective: Telephone call to patient's home number, spoke with patient, and HIPAA verified.  Patient gave [facility] verbal authorization to speak with wife Barkley Yanak) regarding his healthcare needs as needed.   Patient states he is doing ok, feeling tired, and feels it is part of the recovery course.   States his blood pressure is good and is taking medications as prescribed.  States he is receiving physical therapy 2 days a week and it is going well.   Discussed [facility] Care Management EMMI Stroke program and patient in agreement to complete telephone screen. Patient states he is aware of the signs and symptoms of further stroke and when to notify MD's office.  Patient states he has an appointment with cardiologist on 01/10/16 and will discuss increase tiredness with MD.   Patient states he does not have a hospital follow up appointment with primary MD.   [facility] advised patient of the importance of primary MD follow up post hospitalization.   Patient voices understanding and states he will call primary MD Dr. Shon Millet to set up hospital follow up appointment.  Patient states he has no further EMMI Stroke follow up program needs at this time and is very appreciative of RNCM's call.   Patient in agreement to continue to receive [place] [facility] EMMI program automated calls.    Objective: Per chart review: Patient hospitalized 12/28/15 - 01/03/16 with the following diagnoses: Focal seizure, Essential hypertension, Hyponatremia, Acute CVA (cerebrovascular accident), Acute right MCA stroke, Aneurysm, cerebral, nonruptured and  HLD (hyperlipidemia). Patient set up to receive homecare services through [place] post hospital discharge.  Assessment: Received EMMI Stroke Red flag referral on 01/08/16. Report date: 01/08/16. Trigger date : 01/07/16. Day #3. Red flag trigger: Patient answered yes to feeling worse  overall. EMMI Stroke follow up completed and no further [facility] Care Management EMMI program follow up needed at this time.  Plan: RNCM will send case closure due to [facility] program completion / no care management needs at this time request to Josepha Pigg at [facility].    Emslee Lopezmartinez H. Annia Friendly, BSN, [place] Management [facility] Telephonic CM Phone: [phone] Fax: [phone]

## 2016-01-10 ENCOUNTER — Other Ambulatory Visit: Payer: Self-pay | Admitting: Neurology

## 2016-01-10 ENCOUNTER — Ambulatory Visit (INDEPENDENT_AMBULATORY_CARE_PROVIDER_SITE_OTHER): Payer: Medicare Other

## 2016-01-10 DIAGNOSIS — R002 Palpitations: Secondary | ICD-10-CM

## 2016-01-10 DIAGNOSIS — I4891 Unspecified atrial fibrillation: Secondary | ICD-10-CM

## 2016-01-10 DIAGNOSIS — I639 Cerebral infarction, unspecified: Secondary | ICD-10-CM

## 2016-01-15 ENCOUNTER — Other Ambulatory Visit: Payer: Self-pay | Admitting: *Deleted

## 2016-01-15 NOTE — Patient Outreach (Signed)
[place] [facility]) Care Management  01/15/2016  Damon Parrish 1940/11/30 [id number]  Subjective: Telephone call to patient's home number, no answer, left HIPAA compliant voicemail message, and requested call back.   Objective: Per chart review: Patient hospitalized 12/28/15 - 01/03/16 with the following diagnoses: Focal seizure, Essential hypertension, Hyponatremia, Acute CVA (cerebrovascular accident), Acute right MCA stroke, Aneurysm, cerebral, nonruptured and  HLD (hyperlipidemia). Patient set up to receive homecare services through [place] post hospital discharge.   Initial EMMI Stroke follow up completed on 01/09/16.    Assessment: Received EMMI Stroke Red flag referral on 01/15/16. Report date: 01/15/16. Trigger date : 01/13/16. Day #9. Red flag trigger: Patient answered yes to sad, hopeless, anxious, and empty? EMMI Stroke follow up pending, patient contact.  Plan: RNCM will call patient within 1 week for EMMI Red Flag follow up if no return call.    Lavine Hargrove H. Annia Friendly, BSN, [place] Management [facility] Telephonic CM Phone: [phone] Fax: [phone]

## 2016-01-16 ENCOUNTER — Other Ambulatory Visit: Payer: Self-pay | Admitting: *Deleted

## 2016-01-16 NOTE — Patient Outreach (Signed)
[place] [facility]) Care Management  01/16/2016  Damon Parrish 04-18-1941 [id number]  Subjective:  Telephone call to patient's home, spoke with patient, and HIPAA verified.   States he is doing ok and answered the EMMI automated call question incorrectly.    States he does not feel sad or hopeless.  Discussed [facility] EMMI Stroke program follow up and patient in agreement to complete the telephone screen.    States he remembers speaking with this RNCM last week.   States his cardiology appointment went well and he has another follow up appointment.  States he has a dentist appointment on 01/17/16 for something minor.  States he is still receiving physical therapy 3 times a week and it is going well.  States he is still having decreased energy level and thinks it is related to the seizure medication.  RNCM encouraged patient to follow up with MD to discuss energy level if it does not improve, patient voices understanding and states he will.   Patient states he does not have any care coordination, disease management, disease education, or community resource needs at this time.   Patient in agreement to continue to receive EMMI Stroke automated calls.  Objective: Per chart review: Patient hospitalized 12/28/15 - 01/03/16 with the following diagnoses: Focal seizure, Essential hypertension, Hyponatremia, Acute CVA (cerebrovascular accident), Acute right MCA stroke, Aneurysm, cerebral, nonruptured and  HLD (hyperlipidemia). Patient set up to receive homecare services through [place] post hospital discharge. Initial EMMI Stroke follow up completed on 01/09/16.   Assessment: Received EMMI Stroke Red flag referral on 01/15/16. Report date: 01/15/16. Trigger date : 01/13/16. Day #9. Red flag trigger: Patient answered yes to sad, hopeless, anxious, and empty? EMMI Stroke follow up completed, no further care management needs at this time.  Plan: RNCM will send case closure due to program  completion/ no care management needs request to Josepha Pigg at [facility] [place] [facility].       Amaira Safley H. Annia Friendly, BSN, [place] Management [facility] Telephonic CM Phone: [phone] Fax: [phone]

## 2016-01-17 ENCOUNTER — Telehealth: Payer: Self-pay | Admitting: Neurology

## 2016-01-17 NOTE — Telephone Encounter (Signed)
Discussed with wife over the phone. Pt suppose to take keppra 1500mg  bid but wife only gave him 1500mg  Qhs. Even though, pt was very sleepy and fatigue during the day and not able to work with home health therapist. I instructed her to give him 750mg  bid to see if his somnolence would be better. If continues, we may try 375mg  bid. If seizure recurs, or still not tolerating, we may need to stop keppra or change to other AEDs. Wife expressed appreciation and understanding.  Rosalin Hawking, MD PhD Stroke Neurology 01/17/2016 4:11 PM

## 2016-01-17 NOTE — Telephone Encounter (Signed)
Patient's wife is calling. She states since taking medication levETIRAcetam (KEPPRA) 750 MG tablet the patient is completely exhausted all the time. She would like to know if it would be ok to just have the patient take 1 pill at night instead of 2. Please call and advise. She can be reached at [phone].

## 2016-01-19 ENCOUNTER — Other Ambulatory Visit: Payer: Self-pay | Admitting: *Deleted

## 2016-01-19 NOTE — Patient Outreach (Addendum)
[facility]) Care Management  01/19/2016  AMDREW MCROBIE 1941-02-21 [id number]  Subjective: Telephone call to patient's home number, no answer, left HIPAA compliant voice mail message, and requested call back.   Objective: Per chart review: Patient hospitalized 12/28/15 - 01/03/16 with the following diagnoses: Focal seizure, Essential hypertension, Hyponatremia, Acute CVA (cerebrovascular accident), Acute right MCA stroke, Aneurysm, cerebral, nonruptured and  HLD (hyperlipidemia). Patient set up to receive homecare services through [place] post hospital discharge. Initial EMMI Stroke follow up completed on 01/09/16.   Patient's wife contacted neurologist  on 01/17/16 regarding patient's exhaustion and was advised to decrease patient's Keppra.   Assessment: Received EMMI Stroke Red flag referral on 01/18/16. Report date: 12/2715. Trigger date : 01/17/16. Day #13. Red flag trigger: Patient answered no to went to follow up appointment.  EMMI Stroke follow up pending, patient contact.  Plan: RNCM will call patient within 1 week for 2nd attempt EMMI Stroke red flag follow up, if no return call from patient.     Easter Kennebrew H. Annia Friendly, BSN, [place] Management [facility] Telephonic CM Phone: [phone] Fax: [phone]

## 2016-01-22 ENCOUNTER — Other Ambulatory Visit: Payer: Self-pay | Admitting: *Deleted

## 2016-01-22 NOTE — Patient Outreach (Signed)
[place] [facility]) Care Management  01/22/2016  Damon Parrish 1941/07/23 [id number]  Subjective:  Telephone call to patient's home number, spoke with wife, states patient is currently unavailable, working with therapist, left HIPAA compliant voice message with wife for patient, and requested call back.   Objective: Per chart review: Patient hospitalized 12/28/15 - 01/03/16 with the following diagnoses: Focal seizure, Essential hypertension, Hyponatremia, Acute CVA (cerebrovascular accident), Acute right MCA stroke, Aneurysm, cerebral, nonruptured and  HLD (hyperlipidemia). Patient set up to receive homecare services through [place] post hospital discharge. Initial EMMI Stroke follow up completed on 01/09/16. Patient's wife contacted neurologist on 01/17/16 regarding patient's exhaustion and was advised to decrease patient's Keppra.   Assessment: Received EMMI Stroke Red flag referral on 01/18/16. Report date: 12/2715. Trigger date : 01/17/16. Day #13. Red flag trigger: Patient answered no to went to follow up appointment.  EMMI Stroke follow up pending, patient contact.  Plan: RNCM will call patient within 1 week for 3rd attempt EMMI Stroke red flag follow up, if no return call from patient.    Andre Gallego H. Annia Friendly, BSN, [place] Management [facility] Telephonic CM Phone: [phone] Fax: [phone]

## 2016-01-23 ENCOUNTER — Other Ambulatory Visit: Payer: Self-pay | Admitting: *Deleted

## 2016-01-23 NOTE — Patient Outreach (Signed)
[place] [facility]) Care Management  01/23/2016  Damon Parrish 02/04/1941 [id number]  Subjective: Telephone call to patient's home number, spoke with patient, and HIPAA verified.  Discussed [facility] Care Management EMMI stroke follow up program and patient in agreement to services.  States he is doing ok, still feeling tired, but it is some better since seizure medication was decreased.  States physical therapy is going well and he is able to participate. RNCM advised received an alert regarding follow up appointment.   Patient states he has a follow up appointment with neurologist on 03/06/16.    RNCM reinforced the importance of follow up with primary MD and continuity of care.  Patient states his primary MD wants to know everything.   Patient voices understanding of the importance of follow up with primary MD.   States he may set up an appointment with his primary MD "sooner or later". Patient states he is appreciative of RNCM's call and has no other care management needs at this time.   Objective: Per chart review: Patient hospitalized 12/28/15 - 01/03/16 with the following diagnoses: Focal seizure, Essential hypertension, Hyponatremia, Acute CVA (cerebrovascular accident), Acute right MCA stroke, Aneurysm, cerebral, nonruptured and  HLD (hyperlipidemia). Patient set up to receive homecare services through [place] post hospital discharge. Initial EMMI Stroke follow up completed on 01/09/16. Patient's wife contacted neurologist on 01/17/16 regarding patient's exhaustion and was advised to decrease patient's Keppra.   Assessment: Received EMMI Stroke Red flag referral on 01/18/16. Report date: 12/2715. Trigger date : 01/17/16. Day #13. Red flag trigger: Patient answered no to went to follow up appointment.  EMMI Stroke follow up completed and no further care management needs at this time.   Plan: RNCM will send case closure due to program follow up completed/ no care management  needs request to Josepha Pigg at [place] [facility].     Karnisha Lefebre H. Annia Friendly, BSN, [place] Management [facility] Telephonic CM Phone: [phone] Fax: [phone]

## 2016-03-06 ENCOUNTER — Ambulatory Visit (INDEPENDENT_AMBULATORY_CARE_PROVIDER_SITE_OTHER): Payer: Medicare Other | Admitting: Neurology

## 2016-03-06 ENCOUNTER — Encounter: Payer: Self-pay | Admitting: Neurology

## 2016-03-06 VITALS — BP 111/73 | HR 63 | Ht 68.0 in | Wt 155.4 lb

## 2016-03-06 DIAGNOSIS — I10 Essential (primary) hypertension: Secondary | ICD-10-CM | POA: Diagnosis not present

## 2016-03-06 DIAGNOSIS — R569 Unspecified convulsions: Secondary | ICD-10-CM | POA: Diagnosis not present

## 2016-03-06 DIAGNOSIS — E785 Hyperlipidemia, unspecified: Secondary | ICD-10-CM

## 2016-03-06 DIAGNOSIS — I639 Cerebral infarction, unspecified: Secondary | ICD-10-CM

## 2016-03-06 DIAGNOSIS — Z9889 Other specified postprocedural states: Secondary | ICD-10-CM | POA: Diagnosis not present

## 2016-03-06 DIAGNOSIS — G960 Cerebrospinal fluid leak: Secondary | ICD-10-CM

## 2016-03-06 DIAGNOSIS — D181 Lymphangioma, any site: Secondary | ICD-10-CM | POA: Diagnosis not present

## 2016-03-06 DIAGNOSIS — G9608 Other cranial cerebrospinal fluid leak: Secondary | ICD-10-CM

## 2016-03-06 DIAGNOSIS — Z8679 Personal history of other diseases of the circulatory system: Secondary | ICD-10-CM | POA: Diagnosis not present

## 2016-03-06 DIAGNOSIS — I63411 Cerebral infarction due to embolism of right middle cerebral artery: Secondary | ICD-10-CM | POA: Insufficient documentation

## 2016-03-06 MED ORDER — LEVETIRACETAM 500 MG PO TABS: 500.0000 mg | ORAL_TABLET | Freq: Two times a day (BID) | ORAL | Status: DC

## 2016-03-06 NOTE — Patient Instructions (Signed)
-   continue ASA and lipitor for stroke prevention - check BP at home and record. BP goal is 120-140 - resume keppra 500mg  twice a day.  - repeat EEG, if EEG normal, will continue keppra for another 4 months and then taper off and repeat EEG again - Follow up with your primary care physician for stroke risk factor modification. Recommend maintain blood pressure goal <130/80, diabetes with hemoglobin A1c goal below 6.5% and lipids with LDL cholesterol goal below 70 mg/dL.  - follow up in 4 months.

## 2016-03-06 NOTE — Progress Notes (Signed)
STROKE NEUROLOGY FOLLOW UP NOTE  NAME: Damon Parrish DOB: [birth year]  REASON FOR VISIT: stroke follow up HISTORY FROM: pt and wife and chart  Today we had the pleasure of seeing Damon Parrish in follow-up at our [facility]rology [facility]. Pt was accompanied by wife.   History Summary Damon Parrish is a 75 y.o. 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 He had left arm > leg rhythmic shaking as well as neck turning and shaking, lasting about one hour. It was controlled after keppra load. His CT unremarkable except extra-axial CSF accumulation bilaterally, questionalbe for chronic SDH vs. Hygroma. 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 CTA head and neck unremarkable. LE venous doppler showed no DVT. EF 55-60%. Had cerebral angio with Dr. Kathyrn Sheriff showed minimal ICA stenosis at the site of pipeline stent. EEG negative for seizure and keppra continued. LDL 80 and A1C 6.1. He was continued on plavix and lipitor as well as keppra. He was discharged in good condition with recommendation to do 30 day cardiac event monitoring as outpt  Interval History During the interval time, the patient has been doing well. No seizure episode since discharge. He was very sleepy and fatigue at home and not able to work with PT initially. We decreased keppra from 1500mg  bid to 750mg  bid. Symptoms getting better but he still has to take a brief nap after taking keppra. Other than that, he recovers well and now back to his baseline except fatigue. His BP today 111/73. He has finished PT/OT. 30 day cardiac monitoring showed no afib.  REVIEW OF SYSTEMS: Full 14 system review of systems performed and notable only for those listed below and in HPI above, all others are negative:  Constitutional:  Activity change, fatigue Cardiovascular:  Ear/Nose/Throat:   Ringing in ears, runny nose Skin:  Eyes:   Respiratory:  SOB Gastroitestinal:   Genitourinary:  Hematology/Lymphatic:   Endocrine: cold intolerance Musculoskeletal:   Allergy/Immunology:   Neurological:   Psychiatric:  Sleep: frequent waking, daytime sleepiness  The following represents the patient's updated allergies and side effects list: No Known Allergies  The neurologically relevant items on the patient's problem list were reviewed on today's visit.  Neurologic Examination  A problem focused neurological exam (12 or more points of the single system neurologic examination, vital signs counts as 1 point, cranial nerves count for 8 points) was performed.  Blood pressure 111/73, pulse 63, height 5\' 8"  (1.727 m), weight 155 lb 6.4 oz (70.489 kg).  General - Well nourished, well developed, in no apparent distress.  Ophthalmologic - Fundi not visualized due to eye movement.  Cardiovascular - Regular rate and rhythm.  Mental Status -  Level of arousal and orientation to time, place, and person were intact. Language including expression, naming, repetition, comprehension was assessed and found intact. Fund of Knowledge was assessed and was intact.  Cranial Nerves II - XII - II - Visual field intact OU. III, IV, VI - Extraocular movements intact. V - Facial sensation intact bilaterally. VII - Facial movement intact bilaterally, mild left nasolabial fold flattening but wife said this is the way it is. VIII - Hearing & vestibular intact bilaterally. X - Palate elevates symmetrically. XI - Chin turning & shoulder shrug intact bilaterally. XII - Tongue protrusion intact.  Motor Strength - The patient's strength was normal in all extremities and pronator  drift was absent.  Bulk was normal and fasciculations were absent.   Motor Tone - Muscle tone was assessed at the neck and appendages and was normal.  Reflexes - The patient's reflexes were 1+ in all extremities and he had no  pathological reflexes.  Sensory - Light touch, temperature/pinprick were assessed and were normal.    Coordination - The patient had normal movements in the hands and feet with no ataxia or dysmetria.  Tremor was absent.  Gait and Station - walks with cane, steady, mild stooped posturing   Functional score  mRS = 1   0 - No symptoms.   1 - No significant disability. Able to carry out all usual activities, despite some symptoms.   2 - Slight disability. Able to look after own affairs without assistance, but unable to carry out all previous activities.   3 - Moderate disability. Requires some help, but able to walk unassisted.   4 - Moderately severe disability. Unable to attend to own bodily needs without assistance, and unable to walk unassisted.   5 - Severe disability. Requires constant nursing care and attention, bedridden, incontinent.   6 - Dead.   NIH Stroke Scale = 0   Data reviewed: I personally reviewed the images and agree with the radiology interpretations.  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 Involutional changes and minimal chronic small vessel ischemic disease. 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   CTA of head and neck  12/30/2015 1. Right ICA stent appears to be patent. Mild distal small vessel disease bilaterally. 2. No significant proximal stenosis, aneurysm, or branch vessel occlusion. 3. The right white matter infarcts are not visible by CT. 4. Multilevel spondylosis of the cervical spine. 5. Centrilobular emphysema.  EEG - normal EEG  Echo 2D - LVEF 55-60%, mild LVH, normal wall motion, diastolic dysfunction, indeterminate LV filling pressure, normal LA size, mild RAE, trivial TR, normal RVSP.  LE venous doppler - negative  for DVT  Cerebral angio -  1. 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 There is minimal proximal stenosis without any flow limitation. 2. 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 This is stable in comparison to prior angiograms.  Component     Latest Ref Rng 12/31/2015  Cholesterol     0 - 200 mg/dL 133  Triglycerides     <150 mg/dL 79  HDL Cholesterol     >40 mg/dL 37 (L)  Total CHOL/HDL Ratio      3.6  VLDL     0 - 40 mg/dL 16  LDL (calc)     0 - 99 mg/dL 80  Hemoglobin A1C     4.8 - 5.6 % 6.1 (H)  Mean Plasma Glucose      128    Assessment: As you may recall, he is a 75 y.o. 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 He had left arm > leg rhythmic shaking as well as neck turning and shaking, lasting about one hour without LOC. It was controlled after keppra load. His CT unremarkable except extra-axial CSF accumulation bilaterally, questionalbe for chronic SDH vs. Hygroma.  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 CTA head and neck unremarkable. LE venous doppler showed no DVT. EF 55-60%. Had cerebral angio with Dr. Kathyrn Sheriff showed minimal ICA stenosis at the site of pipeline stent. EEG negative for seizure and keppra continued. LDL 80 and A1C 6.1. He was continued on plavix and lipitor as well as keppra. He was discharged in good condition. Had 30 day cardiac event monitoring as outpt which showed no afib. No seizure episode since discharge. Decreased keppra from 1500mg  bid to 750mg  bid due to sleepy and fatigue. Symptoms getting better but he still has to take a brief nap after taking keppra 750mg .   Plan:  - continue ASA and lipitor for stroke prevention - check BP at home and record. BP goal is  120-140 - continue keppra 500mg  bid to limit side effects.  - repeat EEG, if EEG normal, will continue keppra as 500mg  bid for another 4 months and then taper off and repeat EEG again - Follow up with your primary care physician for stroke risk factor modification. Recommend maintain blood pressure goal <130/80, diabetes with hemoglobin A1c goal below 6.5% and lipids with LDL cholesterol goal below 70 mg/dL.  - follow up in 4 months.   I spent more than 25 minutes of face to face time with the patient. Greater than 50% of time was spent in counseling and coordination of care. We discussed seizure control, AED use, repeat EEG and stroke prevention.    Orders Placed This Encounter  Procedures  . EEG adult    Standing Status: Future     Number of Occurrences:      Standing Expiration Date: 03/06/2017    Order Specific Question:  Where should this test be performed?    Answer:  [facility]    Meds ordered this encounter  Medications  . levETIRAcetam (KEPPRA) 500 MG tablet    Sig: Take 1 tablet (500 mg total) by mouth 2 (two) times daily.    Dispense:  120 tablet    Refill:  1    Patient Instructions  - continue ASA and lipitor for stroke prevention - check BP at home and record. BP goal is 120-140 - resume keppra 500mg  twice a day.  - repeat EEG, if EEG normal, will continue keppra for another 4 months and then taper off and repeat EEG again - Follow up with your primary care physician for stroke risk factor modification. Recommend maintain blood pressure goal <130/80, diabetes with hemoglobin A1c goal below 6.5% and lipids with LDL cholesterol goal below 70 mg/dL.  - follow up in 4 months.    Rosalin Hawking, MD PhD [facility] [address], [place], [place] [address] [phone]

## 2016-03-11 ENCOUNTER — Telehealth: Payer: Self-pay

## 2016-03-11 NOTE — Telephone Encounter (Signed)
Rn call patients wife [name] on Dpr about heart monitor. Rn stated pts 30 day heart monitor did not show any irregular heart beat. Rn stated to continue treatment plan per Dr.Xu.

## 2016-03-11 NOTE — Telephone Encounter (Signed)
-----   Message from Rosalin Hawking, MD sent at 03/08/2016  4:59 PM EDT ----- Could you please let the patient know that the 30 day cardiac event monitoring done recently did not show irregular heart beat. Please continue current treatment. Thanks.  Rosalin Hawking, MD PhD Stroke Neurology 03/08/2016 4:59 PM

## 2016-04-04 ENCOUNTER — Ambulatory Visit (INDEPENDENT_AMBULATORY_CARE_PROVIDER_SITE_OTHER): Payer: Medicare Other | Admitting: Neurology

## 2016-04-04 DIAGNOSIS — R569 Unspecified convulsions: Secondary | ICD-10-CM

## 2016-04-04 NOTE — Procedures (Signed)
    History:  Damon Parrish is a 75 year old gentleman with a history of a right internal carotid artery aneurysm. He has had new onset seizures with left arm and leg shaking associated with head turning. Episodes have lasted up to one hour. The patient being evaluated for the seizures.  This is a routine EEG. No skull defects are noted. Medications include vitamin C, aspirin, Lipitor, Simbrinza, vitamin D supplementation, Flonase, hydrochlorothiazide, Prevacid, Keppra, multivitamins, fish oil, and vitamin E supplementation.   EEG classification: Normal awake and drowsy  Description of the recording: The background rhythms of this recording consists of a fairly well modulated medium amplitude alpha rhythm of 9 Hz that is reactive to eye opening and closure. As the record progresses, the patient appears to remain in the waking state throughout the recording. Photic stimulation was performed, resulting in a bilateral and symmetric photic driving response. Hyperventilation was not performed. Toward the end of the recording, the patient enters the drowsy state with slight symmetric slowing seen. The patient never enters stage II sleep. At no time during the recording does there appear to be evidence of spike or spike wave discharges or evidence of focal slowing. EKG monitor shows no evidence of cardiac rhythm abnormalities with a heart rate of 60.  Impression: This is a normal EEG recording in the waking and drowsy state. No evidence of ictal or interictal discharges are seen.

## 2016-04-05 ENCOUNTER — Other Ambulatory Visit: Payer: Medicare Other

## 2016-06-26 ENCOUNTER — Other Ambulatory Visit: Payer: Self-pay | Admitting: Neurology

## 2016-06-26 DIAGNOSIS — R569 Unspecified convulsions: Secondary | ICD-10-CM

## 2016-07-10 ENCOUNTER — Encounter: Payer: Self-pay | Admitting: Neurology

## 2016-07-10 ENCOUNTER — Ambulatory Visit (INDEPENDENT_AMBULATORY_CARE_PROVIDER_SITE_OTHER): Payer: Medicare Other | Admitting: Neurology

## 2016-07-10 VITALS — BP 101/67 | HR 62 | Ht 68.0 in | Wt 160.0 lb

## 2016-07-10 DIAGNOSIS — Z9889 Other specified postprocedural states: Secondary | ICD-10-CM

## 2016-07-10 DIAGNOSIS — Z8679 Personal history of other diseases of the circulatory system: Secondary | ICD-10-CM

## 2016-07-10 DIAGNOSIS — I639 Cerebral infarction, unspecified: Secondary | ICD-10-CM

## 2016-07-10 DIAGNOSIS — E785 Hyperlipidemia, unspecified: Secondary | ICD-10-CM

## 2016-07-10 DIAGNOSIS — R569 Unspecified convulsions: Secondary | ICD-10-CM | POA: Diagnosis not present

## 2016-07-10 DIAGNOSIS — D181 Lymphangioma, any site: Secondary | ICD-10-CM

## 2016-07-10 DIAGNOSIS — G960 Cerebrospinal fluid leak: Secondary | ICD-10-CM

## 2016-07-10 DIAGNOSIS — I63411 Cerebral infarction due to embolism of right middle cerebral artery: Secondary | ICD-10-CM

## 2016-07-10 DIAGNOSIS — G9608 Other cranial cerebrospinal fluid leak: Secondary | ICD-10-CM

## 2016-07-10 NOTE — Progress Notes (Signed)
STROKE NEUROLOGY FOLLOW UP NOTE  NAME: Damon Parrish DOB: 1940-11-02  REASON FOR VISIT: stroke follow up HISTORY FROM: pt and wife and chart  Today we had the pleasure of seeing Damon Parrish in follow-up at our Neurology Clinic. Pt was accompanied by wife.   History Summary Damon Parrish is a 75 y.o. 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 He had left arm > leg rhythmic shaking as well as neck turning and shaking, lasting about one hour. It was controlled after keppra load. His CT unremarkable except extra-axial CSF accumulation bilaterally, questionalbe for chronic SDH vs. Hygroma. 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 CTA head and neck unremarkable. LE venous doppler showed no DVT. EF 55-60%. Had cerebral angio with Dr. Kathyrn Sheriff showed minimal ICA stenosis at the site of pipeline stent. EEG negative for seizure and keppra continued. LDL 80 and A1C 6.1. He was continued on plavix and lipitor as well as keppra. He was discharged in good condition with recommendation to do 30 day cardiac event monitoring as outpt  03/06/16 follow up - the patient has been doing well. No seizure episode since discharge. He was very sleepy and fatigue at home and not able to work with PT initially. We decreased keppra from 1500mg  bid to 750mg  bid. Symptoms getting better but he still has to take a brief nap after taking keppra. Other than that, he recovers well and now back to his baseline except fatigue. His BP today 111/73. He has finished PT/OT. 30 day cardiac monitoring showed no afib.  Interval History During the interval time, pt has been doing well. No seizure like activity or stroke symptoms. On keppra 500mg  bid, sleepy improved but still some sleepiness as per wife. Pt has on complains. BP 101/62.   REVIEW OF SYSTEMS: Full 14  system review of systems performed and notable only for those listed below and in HPI above, all others are negative:  Constitutional:  fatigue Cardiovascular:  Ear/Nose/Throat:   Skin:  Eyes:   Respiratory:   Gastroitestinal:   Genitourinary:  Hematology/Lymphatic:   Endocrine:  Musculoskeletal:   Allergy/Immunology:   Neurological:   Psychiatric:  Sleep:   The following represents the patient's updated allergies and side effects list: No Known Allergies  The neurologically relevant items on the patient's problem list were reviewed on today's visit.  Neurologic Examination  A problem focused neurological exam (12 or more points of the single system neurologic examination, vital signs counts as 1 point, cranial nerves count for 8 points) was performed.  Blood pressure 101/67, pulse 62, height 5\' 8"  (1.727 m), weight 160 lb (72.6 kg).  General - Well nourished, well developed, in no apparent distress.  Ophthalmologic - Fundi not visualized due to eye movement.  Cardiovascular - Regular rate and rhythm.  Mental Status -  Level of arousal and orientation to time, place, and person were intact. Language including expression, naming, repetition, comprehension was assessed and found intact. Fund of Knowledge was assessed and was intact.  Cranial Nerves II - XII - II - Visual field intact OU. III, IV, VI - Extraocular movements intact. V - Facial sensation intact bilaterally. VII - Facial movement intact bilaterally, mild left nasolabial fold flattening but wife said this is the way it is. VIII - Hearing & vestibular intact bilaterally. X - Palate elevates symmetrically. XI Geryl Councilman  turning & shoulder shrug intact bilaterally. XII - Tongue protrusion intact.  Motor Strength - The patient's strength was normal in all extremities and pronator drift was absent.  Bulk was normal and fasciculations were absent.   Motor Tone - Muscle tone was assessed at the neck and appendages and  was normal.  Reflexes - The patient's reflexes were 1+ in all extremities and he had no pathological reflexes.  Sensory - Light touch, temperature/pinprick were assessed and were normal.    Coordination - The patient had normal movements in the hands and feet with no ataxia or dysmetria.  Tremor was absent.  Gait and Station - walks with cane, steady, mild stooped posturing   Data reviewed: I personally reviewed the images and agree with the radiology interpretations.  Damon Parrish Wo Contrast 12/30/2015  Patchy acute ischemia 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 Involutional changes and minimal chronic small vessel ischemic disease. 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   CTA of head and neck  12/30/2015 1. Right ICA stent appears to be patent. Mild distal small vessel disease bilaterally. 2. No significant proximal stenosis, aneurysm, or branch vessel occlusion. 3. The right white matter infarcts are not visible by CT. 4. Multilevel spondylosis of the cervical spine. 5. Centrilobular emphysema.  EEG - normal EEG  Echo 2D - LVEF 55-60%, mild LVH, normal wall motion, diastolic dysfunction, indeterminate LV filling pressure, normal LA size, mild RAE, trivial TR, normal RVSP.  LE venous doppler - negative for DVT  Cerebral angio -  1. 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 There is minimal proximal stenosis without any flow limitation. 2. 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 This is stable in comparison to prior angiograms.  EEG 04/04/16 - This is a normal EEG recording in the waking and drowsy state. No evidence of ictal or interictal discharges are seen.  Component     Latest Ref Rng 12/31/2015   Cholesterol     0 - 200 mg/dL 133  Triglycerides     <150 mg/dL 79  HDL Cholesterol     >40 mg/dL 37 (L)  Total CHOL/HDL Ratio      3.6  VLDL     0 - 40 mg/dL 16  LDL (calc)     0 - 99 mg/dL 80  Hemoglobin A1C     4.8 - 5.6 % 6.1 (H)  Mean Plasma Glucose      128    Assessment: As you may recall, he is a 75 y.o. 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 He had left arm > leg rhythmic shaking as well as neck turning and shaking, lasting about one hour without LOC. It was controlled after keppra load. His CT unremarkable except extra-axial CSF accumulation bilaterally, questionalbe for chronic SDH vs. Hygroma. 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 CTA head and neck unremarkable. LE venous doppler showed no DVT. EF 55-60%. Had cerebral angio with Dr. Kathyrn Sheriff showed minimal ICA stenosis at the site of pipeline stent. EEG negative for seizure and keppra continued. LDL 80 and A1C 6.1. He was continued on plavix and lipitor as well as keppra. He was discharged  in good condition. Had 30 day cardiac event monitoring as outpt which showed no afib. No seizure episode since discharge. Decreased keppra from 1500mg  bid to 750mg  bid due to sleepy and fatigue. Symptoms getting better but he still has to take a brief nap after taking keppra 750mg . Then Keppra was decreased further to 500mg  bid and repeat EEG normal. Now 6 months out of his stroke and seizure, will taper off keppra and repeat EEG.   Plan:  - continue ASA and lipitor for stroke prevention - check BP at home and record.  - take keppra half tab 250mg  twice a day for one week and then half tabl 250mg  at night daily for one week and off - repeat EEG in 4 weeks. - Follow up with your primary care physician for stroke risk factor modification. Recommend maintain  blood pressure goal 120-140/80, diabetes with hemoglobin A1c goal below 6.5% and lipids with LDL cholesterol goal below 70 mg/dL.  - follow up in 6 months.   I spent more than 25 minutes of face to face time with the patient. Greater than 50% of time was spent in counseling and coordination of care. We discussed seizure control, keppra taper off, repeat EEG and stroke prevention.    Orders Placed This Encounter  Procedures  . EEG adult    Standing Status:   Future    Standing Expiration Date:   07/10/2017    Scheduling Instructions:     Repeat EEG in 4 weeks. thanks    No orders of the defined types were placed in this encounter.   Patient Instructions  - continue ASA and lipitor for stroke prevention - check BP at home and record.  - take keppra half tab 250mg  twice a day for one week and then half tabl 250mg  at night daily for one week and off - repeat EEG in 4 weeks. - Follow up with your primary care physician for stroke risk factor modification. Recommend maintain blood pressure goal 120-140/80, diabetes with hemoglobin A1c goal below 6.5% and lipids with LDL cholesterol goal below 70 mg/dL.  - follow up in 6 months.     Rosalin Hawking, MD PhD [facility] [address] [place], [place] [address] [phone]

## 2016-07-10 NOTE — Patient Instructions (Addendum)
-   continue ASA and lipitor for stroke prevention - check BP at home and record.  - take keppra half tab 250mg  twice a day for one week and then half tabl 250mg  at night daily for one week and off - repeat EEG in 4 weeks. - Follow up with your primary care physician for stroke risk factor modification. Recommend maintain blood pressure goal 120-140/80, diabetes with hemoglobin A1c goal below 6.5% and lipids with LDL cholesterol goal below 70 mg/dL.  - follow up in 6 months.

## 2016-08-08 ENCOUNTER — Ambulatory Visit (INDEPENDENT_AMBULATORY_CARE_PROVIDER_SITE_OTHER): Payer: Medicare Other | Admitting: Neurology

## 2016-08-08 DIAGNOSIS — R569 Unspecified convulsions: Secondary | ICD-10-CM

## 2016-08-08 NOTE — Procedures (Signed)
    History:  Damon Parrish is a 75 year old gentleman with a history of a right internal carotid artery aneurysm, status post stenting. The patient was admitted for an evaluation on 12/28/2015 for new onset seizures. The patient had left arm and leg shaking. The episode lasted about one hour. The patient was found to have a patchy infarct in the right frontoparietal area. The patient is being evaluated for these seizures.  This is a routine EEG. No skull defects are noted. Medications include fish oil, multivitamins, Keppra, Prevacid, hydrochlorothiazide, Flonase, vitamin D, Simbrinza, Ocuvite, Lipitor, aspirin, and vitamin C.   EEG classification: Normal awake and drowsy  Description of the recording: The background rhythms of this recording consists of a fairly well modulated medium amplitude alpha rhythm of 10 Hz that is reactive to eye opening and closure. As the record progresses, the patient appears to remain in the waking state throughout the recording. Photic stimulation was performed, resulting in a bilateral and symmetric photic driving response. Hyperventilation was also performed, resulting in a minimal buildup of the background rhythm activities without significant slowing seen. Toward the end of the recording, the patient enters the drowsy state with slight symmetric slowing seen. The patient never enters stage II sleep. At no time during the recording does there appear to be evidence of spike or spike wave discharges or evidence of focal slowing. EKG monitor shows no evidence of cardiac rhythm abnormalities with exception of occasional premature ventricular complexes, with a heart rate of 60.  Impression: This is a normal EEG recording in the waking and drowsy state. No evidence of ictal or interictal discharges are seen.

## 2016-08-13 ENCOUNTER — Telehealth: Payer: Self-pay

## 2016-08-13 NOTE — Telephone Encounter (Signed)
Rn call patient that his EEG was normal. Pt verbalized understanding. 

## 2016-08-13 NOTE — Telephone Encounter (Signed)
-----   Message from Rosalin Hawking, MD sent at 08/12/2016  6:13 PM EST ----- Could you please let the patient know that the EEG test done recently in our office was normal. Please continue current treatment. Thanks.  Rosalin Hawking, MD PhD Stroke Neurology 08/12/2016 6:13 PM

## 2016-08-24 NOTE — Progress Notes (Signed)
Hi, Katrina:  Could you please let the patient know that the EEG test done recently in our office was normal EEG. No seizure like activity. Please continue current treatment. Thanks.  Rosalin Hawking, MD PhD Stroke Neurology 08/24/2016 7:46 AM

## 2016-08-29 NOTE — Telephone Encounter (Signed)
See note about EEG results.

## 2017-01-08 ENCOUNTER — Ambulatory Visit: Payer: Medicare Other | Admitting: Neurology

## 2017-03-31 ENCOUNTER — Ambulatory Visit (INDEPENDENT_AMBULATORY_CARE_PROVIDER_SITE_OTHER): Payer: Medicare Other | Admitting: Neurology

## 2017-03-31 ENCOUNTER — Encounter: Payer: Self-pay | Admitting: Neurology

## 2017-03-31 VITALS — BP 111/72 | HR 64 | Wt 162.8 lb

## 2017-03-31 DIAGNOSIS — G960 Cerebrospinal fluid leak: Secondary | ICD-10-CM

## 2017-03-31 DIAGNOSIS — I10 Essential (primary) hypertension: Secondary | ICD-10-CM | POA: Diagnosis not present

## 2017-03-31 DIAGNOSIS — D181 Lymphangioma, any site: Secondary | ICD-10-CM

## 2017-03-31 DIAGNOSIS — E785 Hyperlipidemia, unspecified: Secondary | ICD-10-CM

## 2017-03-31 DIAGNOSIS — G9608 Other cranial cerebrospinal fluid leak: Secondary | ICD-10-CM

## 2017-03-31 DIAGNOSIS — Z8679 Personal history of other diseases of the circulatory system: Secondary | ICD-10-CM | POA: Diagnosis not present

## 2017-03-31 DIAGNOSIS — Z9889 Other specified postprocedural states: Secondary | ICD-10-CM | POA: Diagnosis not present

## 2017-03-31 DIAGNOSIS — R569 Unspecified convulsions: Secondary | ICD-10-CM

## 2017-03-31 DIAGNOSIS — I63411 Cerebral infarction due to embolism of right middle cerebral artery: Secondary | ICD-10-CM | POA: Diagnosis not present

## 2017-03-31 NOTE — Progress Notes (Signed)
STROKE NEUROLOGY FOLLOW UP NOTE  NAME: SHAHEER BONFIELD DOB: Jan 22, 1941  REASON FOR VISIT: stroke follow up HISTORY FROM: pt and wife and chart  Today we had the pleasure of seeing Antionette Fairy in follow-up at our Neurology [facility]. Pt was accompanied by wife.   History Summary Mr. NILES ESS is a 76 y.o. 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 He had left arm > leg rhythmic shaking as well as neck turning and shaking, lasting about one hour. It was controlled after keppra load. His CT unremarkable except extra-axial CSF accumulation bilaterally, questionalbe for chronic SDH vs. Hygroma. 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 CTA head and neck unremarkable. LE venous doppler showed no DVT. EF 55-60%. Had cerebral angio with Dr. Kathyrn Sheriff showed minimal ICA stenosis at the site of pipeline stent. EEG negative for seizure and keppra continued. LDL 80 and A1C 6.1. He was continued on plavix and lipitor as well as keppra. He was discharged in good condition with recommendation to do 30 day cardiac event monitoring as outpt  03/06/16 follow up - the patient has been doing well. No seizure episode since discharge. He was very sleepy and fatigue at home and not able to work with PT initially. We decreased keppra from 1500mg  bid to 750mg  bid. Symptoms getting better but he still has to take a brief nap after taking keppra. Other than that, he recovers well and now back to his baseline except fatigue. His BP today 111/73. He has finished PT/OT. 30 day cardiac monitoring showed no afib.  07/10/16 follow up - pt has been doing well. No seizure like activity or stroke symptoms. On keppra 500mg  bid, sleepy improved but still some sleepiness as per wife. Pt has on complains. BP 101/62.   Interval History During the interval time, pt has  been doing well. No recurrent seizure episodes. Has tapered off keppra and repeat EEG was normal. He feels good and doing more house work. Still has brief nap at home which wife thinks normal himself. BP 112/72.   REVIEW OF SYSTEMS: Full 14 system review of systems performed and notable only for those listed below and in HPI above, all others are negative:  Constitutional:  fatigue Cardiovascular:  Ear/Nose/Throat:  Hearing loss, ringing in ears Skin:  Eyes:   Respiratory:   Gastroitestinal:   Genitourinary:  Hematology/Lymphatic:  Bruise/bleeding easily Endocrine:  Musculoskeletal:   Allergy/Immunology:   Neurological:   Psychiatric:  Sleep:   The following represents the patient's updated allergies and side effects list: No Known Allergies  The neurologically relevant items on the patient's problem list were reviewed on today's visit.  Neurologic Examination  A problem focused neurological exam (12 or more points of the single system neurologic examination, vital signs counts as 1 point, cranial nerves count for 8 points) was performed.  Blood pressure 111/72, pulse 64, weight 162 lb 12.8 oz (73.8 kg).  General - Well nourished, well developed, in no apparent distress.  Ophthalmologic - Fundi not visualized due to eye movement.  Cardiovascular - Regular rate and rhythm.  Mental Status -  Level of arousal and orientation to time, place, and person were intact. Language including expression, naming, repetition, comprehension was assessed and found intact. Fund of Knowledge was assessed and was intact.  Cranial Nerves II - XII - II - Visual field intact OU. III,  IV, VI - Extraocular movements intact. V - Facial sensation intact bilaterally. VII - Facial movement intact bilaterally, mild left nasolabial fold flattening but wife said this is the way it is. VIII - Hearing & vestibular intact bilaterally. X - Palate elevates symmetrically. XI - Chin turning & shoulder shrug  intact bilaterally. XII - Tongue protrusion intact.  Motor Strength - The patient's strength was normal in all extremities and pronator drift was absent.  Bulk was normal and fasciculations were absent.   Motor Tone - Muscle tone was assessed at the neck and appendages and was normal.  Reflexes - The patient's reflexes were 1+ in all extremities and he had no pathological reflexes.  Sensory - Light touch, temperature/pinprick were assessed and were normal.    Coordination - The patient had normal movements in the hands and feet with no ataxia or dysmetria.  Tremor was absent.  Gait and Station - walks without device, steady, mild stooped posturing   Data reviewed: I personally reviewed the images and agree with the radiology interpretations.  [name] Wo Contrast 12/30/2015  Patchy acute ischemia 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 Involutional changes and minimal chronic small vessel ischemic disease. 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   CTA of head and neck  12/30/2015 1. Right ICA stent appears to be patent. Mild distal small vessel disease bilaterally. 2. No significant proximal stenosis, aneurysm, or branch vessel occlusion. 3. The right white matter infarcts are not visible by CT. 4. Multilevel spondylosis of the cervical spine. 5. Centrilobular emphysema.  EEG - normal EEG  Echo 2D - LVEF 55-60%, mild LVH, normal wall motion, diastolic dysfunction, indeterminate LV filling pressure, normal LA size, mild RAE, trivial TR, normal RVSP.  LE venous doppler - negative for DVT  Cerebral angio -  1. 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 There is minimal proximal stenosis without any flow limitation. 2. Small proximal left cavernous ICA  aneurysm measures <82mm as described above. This is stable in comparison to prior angiograms.  EEG 04/04/16 - This is a normal EEG recording in the waking and drowsy state. No evidence of ictal or interictal discharges are seen.  EEG 08/08/16 - This is a normal EEG recording in the waking and  drowsy state. No evidence of ictal or interictal discharges are seen.  Component     Latest Ref Rng 12/31/2015  Cholesterol     0 - 200 mg/dL 133  Triglycerides     <150 mg/dL 79  HDL Cholesterol     >40 mg/dL 37 (L)  Total CHOL/HDL Ratio      3.6  VLDL     0 - 40 mg/dL 16  LDL (calc)     0 - 99 mg/dL 80  Hemoglobin A1C     4.8 - 5.6 % 6.1 (H)  Mean Plasma Glucose      128    Assessment: As you may recall, he is a 75 y.o. 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 He had left arm > leg rhythmic shaking as well as neck turning and shaking, lasting about one hour without LOC. It was controlled after keppra load. His CT unremarkable except extra-axial CSF  accumulation bilaterally, questionalbe for chronic SDH vs. Hygroma. 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 CTA head and neck unremarkable. LE venous doppler showed no DVT. EF 55-60%. Had cerebral angio with Dr. Kathyrn Sheriff showed minimal ICA stenosis at the site of pipeline stent. EEG negative for seizure and keppra continued. LDL 80 and A1C 6.1. He was continued on plavix and lipitor as well as keppra. He was discharged in good condition. Had 30 day cardiac event monitoring as outpt which showed no afib. No seizure episode since discharge. Decreased keppra from 1500mg  bid to 750mg  bid due to sleepy and fatigue. Symptoms getting better but he still has to take a brief nap after taking keppra 750mg . Then Keppra was decreased further to 500mg  bid and repeat EEG normal. After 6 months  out of his stroke and seizure, keppra was tapered off and repeat EEG again normal. Currently doing well without recurrent episodes.   Plan:  - continue ASA and lipitor for stroke prevention - check BP at home and record.  - regular exercise and health diet - Follow up with your primary care physician for stroke risk factor modification. Recommend maintain blood pressure goal 120-140/80, diabetes with hemoglobin A1c goal below 6.5% and lipids with LDL cholesterol goal below 70 mg/dL.  - follow up as needed.    No orders of the defined types were placed in this encounter.   No orders of the defined types were placed in this encounter.   Patient Instructions  - continue ASA and lipitor for stroke prevention - check BP at home and record.  - regular exercise and health diet - Follow up with your primary care physician for stroke risk factor modification. Recommend maintain blood pressure goal 120-140/80, diabetes with hemoglobin A1c goal below 6.5% and lipids with LDL cholesterol goal below 70 mg/dL.  - follow up as needed.    Rosalin Hawking, MD PhD [facility] [address], [place],  [address] [phone]

## 2017-03-31 NOTE — Patient Instructions (Signed)
-   continue ASA and lipitor for stroke prevention - check BP at home and record.  - regular exercise and health diet - Follow up with your primary care physician for stroke risk factor modification. Recommend maintain blood pressure goal 120-140/80, diabetes with hemoglobin A1c goal below 6.5% and lipids with LDL cholesterol goal below 70 mg/dL.  - follow up as needed.

## 2017-12-15 ENCOUNTER — Other Ambulatory Visit: Payer: Self-pay

## 2017-12-15 ENCOUNTER — Emergency Department (HOSPITAL_COMMUNITY)
Admission: EM | Admit: 2017-12-15 | Discharge: 2017-12-15 | Disposition: A | Discharge status: Home / Self Care | Payer: Medicare Other | Source: Home / Self Care

## 2017-12-15 ENCOUNTER — Encounter (HOSPITAL_COMMUNITY): Payer: Self-pay | Admitting: *Deleted

## 2017-12-15 DIAGNOSIS — Z5321 Procedure and treatment not carried out due to patient leaving prior to being seen by health care provider: Secondary | ICD-10-CM | POA: Insufficient documentation

## 2017-12-15 DIAGNOSIS — R1030 Lower abdominal pain, unspecified: Secondary | ICD-10-CM | POA: Insufficient documentation

## 2017-12-15 DIAGNOSIS — R1084 Generalized abdominal pain: Secondary | ICD-10-CM | POA: Diagnosis not present

## 2017-12-15 DIAGNOSIS — G061 Intraspinal abscess and granuloma: Secondary | ICD-10-CM | POA: Diagnosis not present

## 2017-12-15 NOTE — ED Triage Notes (Signed)
Pt c/o lower back and lower abdominal pain that started last week. LBM 3 days ago. Wife reports pt has had "severe constipation" for about 3 weeks. Pt was told he had fecal impaction 1 week ago at PCP office after abdominal xray was taken and was given several laxatives. Pt reports he took them all and had good results with it but the very next day he started again with constipation.

## 2017-12-16 ENCOUNTER — Inpatient Hospital Stay (HOSPITAL_COMMUNITY)
Admission: EM | Admit: 2017-12-16 | Discharge: 2017-12-18 | DRG: 095 | Disposition: A | Discharge status: Other Acute Inpatient Hospital | Payer: Medicare Other | Attending: Family Medicine | Admitting: Family Medicine

## 2017-12-16 ENCOUNTER — Emergency Department (HOSPITAL_COMMUNITY): Payer: Medicare Other

## 2017-12-16 ENCOUNTER — Encounter (HOSPITAL_COMMUNITY): Payer: Self-pay | Admitting: Cardiology

## 2017-12-16 DIAGNOSIS — E7800 Pure hypercholesterolemia, unspecified: Secondary | ICD-10-CM | POA: Diagnosis present

## 2017-12-16 DIAGNOSIS — G062 Extradural and subdural abscess, unspecified: Secondary | ICD-10-CM | POA: Diagnosis present

## 2017-12-16 DIAGNOSIS — M4640 Discitis, unspecified, site unspecified: Secondary | ICD-10-CM | POA: Diagnosis present

## 2017-12-16 DIAGNOSIS — Z8673 Personal history of transient ischemic attack (TIA), and cerebral infarction without residual deficits: Secondary | ICD-10-CM

## 2017-12-16 DIAGNOSIS — M4644 Discitis, unspecified, thoracic region: Secondary | ICD-10-CM | POA: Diagnosis present

## 2017-12-16 DIAGNOSIS — K5730 Diverticulosis of large intestine without perforation or abscess without bleeding: Secondary | ICD-10-CM | POA: Diagnosis present

## 2017-12-16 DIAGNOSIS — E871 Hypo-osmolality and hyponatremia: Secondary | ICD-10-CM | POA: Diagnosis present

## 2017-12-16 DIAGNOSIS — K921 Melena: Secondary | ICD-10-CM | POA: Diagnosis not present

## 2017-12-16 DIAGNOSIS — R569 Unspecified convulsions: Secondary | ICD-10-CM

## 2017-12-16 DIAGNOSIS — G9529 Other cord compression: Secondary | ICD-10-CM | POA: Diagnosis present

## 2017-12-16 DIAGNOSIS — K5900 Constipation, unspecified: Secondary | ICD-10-CM | POA: Diagnosis present

## 2017-12-16 DIAGNOSIS — K219 Gastro-esophageal reflux disease without esophagitis: Secondary | ICD-10-CM | POA: Diagnosis present

## 2017-12-16 DIAGNOSIS — E860 Dehydration: Secondary | ICD-10-CM | POA: Diagnosis present

## 2017-12-16 DIAGNOSIS — E785 Hyperlipidemia, unspecified: Secondary | ICD-10-CM | POA: Diagnosis present

## 2017-12-16 DIAGNOSIS — Z66 Do not resuscitate: Secondary | ICD-10-CM | POA: Diagnosis present

## 2017-12-16 DIAGNOSIS — I10 Essential (primary) hypertension: Secondary | ICD-10-CM | POA: Diagnosis present

## 2017-12-16 DIAGNOSIS — K5909 Other constipation: Secondary | ICD-10-CM | POA: Diagnosis present

## 2017-12-16 DIAGNOSIS — R339 Retention of urine, unspecified: Secondary | ICD-10-CM | POA: Diagnosis present

## 2017-12-16 DIAGNOSIS — M4624 Osteomyelitis of vertebra, thoracic region: Secondary | ICD-10-CM | POA: Diagnosis present

## 2017-12-16 DIAGNOSIS — Z8679 Personal history of other diseases of the circulatory system: Secondary | ICD-10-CM

## 2017-12-16 DIAGNOSIS — K625 Hemorrhage of anus and rectum: Secondary | ICD-10-CM | POA: Diagnosis present

## 2017-12-16 DIAGNOSIS — G4089 Other seizures: Secondary | ICD-10-CM | POA: Diagnosis present

## 2017-12-16 DIAGNOSIS — Z7951 Long term (current) use of inhaled steroids: Secondary | ICD-10-CM

## 2017-12-16 DIAGNOSIS — F17210 Nicotine dependence, cigarettes, uncomplicated: Secondary | ICD-10-CM | POA: Diagnosis present

## 2017-12-16 DIAGNOSIS — G061 Intraspinal abscess and granuloma: Principal | ICD-10-CM | POA: Diagnosis present

## 2017-12-16 DIAGNOSIS — I63411 Cerebral infarction due to embolism of right middle cerebral artery: Secondary | ICD-10-CM | POA: Diagnosis present

## 2017-12-16 DIAGNOSIS — Z7982 Long term (current) use of aspirin: Secondary | ICD-10-CM

## 2017-12-16 LAB — URINALYSIS, ROUTINE W REFLEX MICROSCOPIC
BILIRUBIN URINE: NEGATIVE
Bacteria, UA: NONE SEEN
GLUCOSE, UA: NEGATIVE mg/dL
HGB URINE DIPSTICK: NEGATIVE
Ketones, ur: 20 mg/dL — AB
LEUKOCYTES UA: NEGATIVE
NITRITE: NEGATIVE
PROTEIN: 30 mg/dL — AB
Specific Gravity, Urine: 1.013 (ref 1.005–1.030)
Squamous Epithelial / LPF: NONE SEEN
pH: 7 (ref 5.0–8.0)

## 2017-12-16 LAB — BASIC METABOLIC PANEL
Anion gap: 13 (ref 5–15)
Anion gap: 13 (ref 5–15)
BUN: 11 mg/dL (ref 6–20)
BUN: 12 mg/dL (ref 6–20)
CHLORIDE: 89 mmol/L — AB (ref 101–111)
CO2: 24 mmol/L (ref 22–32)
CO2: 21 mmol/L — AB (ref 22–32)
CREATININE: 0.89 mg/dL (ref 0.61–1.24)
Calcium: 9 mg/dL (ref 8.9–10.3)
Calcium: 8.7 mg/dL — ABNORMAL LOW (ref 8.9–10.3)
Chloride: 89 mmol/L — ABNORMAL LOW (ref 101–111)
Creatinine, Ser: 0.85 mg/dL (ref 0.61–1.24)
GFR calc Af Amer: >60 mL/min (ref >60)
GFR calc non Af Amer: >60 mL/min (ref >60)
GLUCOSE: 104 mg/dL — AB (ref 65–99)
Glucose, Bld: 106 mg/dL — ABNORMAL HIGH (ref 65–99)
POTASSIUM: 3.2 mmol/L — AB (ref 3.5–5.1)
POTASSIUM: 3.4 mmol/L — AB (ref 3.5–5.1)
SODIUM: 123 mmol/L — AB (ref 135–145)
Sodium: 126 mmol/L — ABNORMAL LOW (ref 135–145)

## 2017-12-16 LAB — COMPREHENSIVE METABOLIC PANEL
ALT: 28 U/L (ref 17–63)
AST: 28 U/L (ref 15–41)
Albumin: 3.5 g/dL (ref 3.5–5.0)
Alkaline Phosphatase: 109 U/L (ref 38–126)
Anion gap: 18 — ABNORMAL HIGH (ref 5–15)
BUN: 12 mg/dL (ref 6–20)
CHLORIDE: 85 mmol/L — AB (ref 101–111)
CO2: 19 mmol/L — ABNORMAL LOW (ref 22–32)
CREATININE: 0.81 mg/dL (ref 0.61–1.24)
Calcium: 9.1 mg/dL (ref 8.9–10.3)
GFR calc non Af Amer: >60 mL/min (ref >60)
Glucose, Bld: 103 mg/dL — ABNORMAL HIGH (ref 65–99)
Potassium: 3.4 mmol/L — ABNORMAL LOW (ref 3.5–5.1)
SODIUM: 122 mmol/L — AB (ref 135–145)
Total Bilirubin: 0.9 mg/dL (ref 0.3–1.2)
Total Protein: 7.4 g/dL (ref 6.5–8.1)

## 2017-12-16 LAB — CBC WITH DIFFERENTIAL/PLATELET
BASOS ABS: 0 10*3/uL (ref 0.0–0.1)
Basophils Relative: 0 %
EOS ABS: 0 10*3/uL (ref 0.0–0.7)
EOS PCT: 0 %
HCT: 41.7 % (ref 39.0–52.0)
Hemoglobin: 14.7 g/dL (ref 13.0–17.0)
Lymphocytes Relative: 14 %
Lymphs Abs: 2 10*3/uL (ref 0.7–4.0)
MCH: 33 pg (ref 26.0–34.0)
MCHC: 35.3 g/dL (ref 30.0–36.0)
MCV: 93.5 fL (ref 78.0–100.0)
Monocytes Absolute: 0.7 10*3/uL (ref 0.1–1.0)
Monocytes Relative: 5 %
Neutro Abs: 11.7 10*3/uL (ref 1.7–7.7)
Neutrophils Relative %: 81 %
PLATELETS: ADEQUATE 10*3/uL (ref 150–400)
RBC: 4.46 MIL/uL (ref 4.22–5.81)
RDW: 12.4 % (ref 11.5–15.5)
WBC: 14.4 10*3/uL — AB (ref 4.0–10.5)

## 2017-12-16 LAB — GLUCOSE, CAPILLARY: GLUCOSE-CAPILLARY: 106 mg/dL — AB (ref 65–99)

## 2017-12-16 LAB — TSH: TSH: 1.263 u[IU]/mL (ref 0.350–4.500)

## 2017-12-16 LAB — OSMOLALITY: OSMOLALITY: 262 mosm/kg — AB (ref 275–295)

## 2017-12-16 LAB — SODIUM, URINE, RANDOM: Sodium, Ur: 13 mmol/L

## 2017-12-16 LAB — MAGNESIUM: MAGNESIUM: 1.8 mg/dL (ref 1.7–2.4)

## 2017-12-16 MED ORDER — SORBITOL 70 % SOLN
960.0000 mL | TOPICAL_OIL | Freq: Once | ORAL | Status: AC
  Administered 2017-12-16: 960 mL via RECTAL
  Filled 2017-12-16: qty 473

## 2017-12-16 MED ORDER — ACETAMINOPHEN 650 MG RE SUPP: 650.0000 mg | Freq: Four times a day (QID) | RECTAL | Status: DC | PRN

## 2017-12-16 MED ORDER — AMLODIPINE BESYLATE 5 MG PO TABS
5.0000 mg | ORAL_TABLET | Freq: Every day | ORAL | Status: DC
  Administered 2017-12-16 – 2017-12-17 (×2): 5 mg via ORAL
  Filled 2017-12-16 (×2): qty 1

## 2017-12-16 MED ORDER — HYDRALAZINE HCL 20 MG/ML IJ SOLN: 10.0000 mg | INTRAMUSCULAR | Status: DC | PRN

## 2017-12-16 MED ORDER — LATANOPROST 0.005 % OP SOLN
1.0000 [drp] | Freq: Two times a day (BID) | OPHTHALMIC | Status: DC
  Administered 2017-12-17 (×2): 1 [drp] via OPHTHALMIC
  Filled 2017-12-16: qty 2.5

## 2017-12-16 MED ORDER — ONDANSETRON HCL 4 MG/2ML IJ SOLN
4.0000 mg | Freq: Once | INTRAMUSCULAR | Status: AC
  Administered 2017-12-16: 4 mg via INTRAVENOUS
  Filled 2017-12-16: qty 2

## 2017-12-16 MED ORDER — ACETAMINOPHEN 325 MG PO TABS: 650.0000 mg | ORAL_TABLET | Freq: Four times a day (QID) | ORAL | Status: DC | PRN

## 2017-12-16 MED ORDER — FLUTICASONE PROPIONATE 50 MCG/ACT NA SUSP
1.0000 | Freq: Every day | NASAL | Status: DC
  Administered 2017-12-17: 1 via NASAL
  Filled 2017-12-16: qty 16

## 2017-12-16 MED ORDER — VITAMIN E 180 MG (400 UNIT) PO CAPS
400.0000 [IU] | ORAL_CAPSULE | Freq: Every day | ORAL | Status: DC
  Administered 2017-12-17: 400 [IU] via ORAL
  Filled 2017-12-16: qty 1

## 2017-12-16 MED ORDER — IOPAMIDOL (ISOVUE-300) INJECTION 61%
100.0000 mL | Freq: Once | INTRAVENOUS | Status: AC | PRN
  Administered 2017-12-16: 100 mL via INTRAVENOUS

## 2017-12-16 MED ORDER — SODIUM CHLORIDE 0.9 % IV SOLN
INTRAVENOUS | Status: AC
  Administered 2017-12-16: 20:00:00 via INTRAVENOUS

## 2017-12-16 MED ORDER — POTASSIUM CHLORIDE 20 MEQ/15ML (10%) PO SOLN
40.0000 meq | Freq: Every day | ORAL | Status: DC
  Administered 2017-12-16 – 2017-12-17 (×2): 40 meq via ORAL
  Filled 2017-12-16 (×2): qty 30

## 2017-12-16 MED ORDER — HYDROMORPHONE HCL 1 MG/ML IJ SOLN
0.5000 mg | Freq: Once | INTRAMUSCULAR | Status: AC
  Administered 2017-12-16: 0.5 mg via INTRAVENOUS
  Filled 2017-12-16: qty 1

## 2017-12-16 MED ORDER — ENOXAPARIN SODIUM 40 MG/0.4ML ~~LOC~~ SOLN
40.0000 mg | SUBCUTANEOUS | Status: DC
  Administered 2017-12-16: 40 mg via SUBCUTANEOUS
  Filled 2017-12-16: qty 0.4

## 2017-12-16 MED ORDER — SODIUM CHLORIDE 0.9 % IV BOLUS
500.0000 mL | Freq: Once | INTRAVENOUS | Status: AC
Start: 2017-12-16 — End: 2017-12-16
  Administered 2017-12-16: 500 mL via INTRAVENOUS

## 2017-12-16 MED ORDER — VITAMIN D 1000 UNITS PO TABS
1000.0000 [IU] | ORAL_TABLET | Freq: Every day | ORAL | Status: DC
  Administered 2017-12-17: 1000 [IU] via ORAL
  Filled 2017-12-16 (×2): qty 1

## 2017-12-16 MED ORDER — VITAMIN C 500 MG PO TABS
1000.0000 mg | ORAL_TABLET | Freq: Every day | ORAL | Status: DC
  Administered 2017-12-17: 1000 mg via ORAL
  Filled 2017-12-16: qty 2

## 2017-12-16 MED ORDER — OCUVITE-LUTEIN PO CAPS
1.0000 | ORAL_CAPSULE | Freq: Every day | ORAL | Status: DC
  Administered 2017-12-17: 1 via ORAL
  Filled 2017-12-16: qty 1

## 2017-12-16 MED ORDER — ASPIRIN 325 MG PO TABS
325.0000 mg | ORAL_TABLET | Freq: Every day | ORAL | Status: DC
  Administered 2017-12-16 – 2017-12-17 (×2): 325 mg via ORAL
  Filled 2017-12-16 (×2): qty 1

## 2017-12-16 MED ORDER — KETOROLAC TROMETHAMINE 15 MG/ML IJ SOLN
15.0000 mg | Freq: Four times a day (QID) | INTRAMUSCULAR | Status: DC | PRN
  Administered 2017-12-16 – 2017-12-17 (×3): 15 mg via INTRAVENOUS
  Filled 2017-12-16 (×3): qty 1

## 2017-12-16 MED ORDER — ONDANSETRON HCL 4 MG PO TABS
4.0000 mg | ORAL_TABLET | Freq: Four times a day (QID) | ORAL | Status: DC | PRN
Start: 2017-12-16 — End: 2017-12-18

## 2017-12-16 MED ORDER — ONDANSETRON HCL 4 MG/2ML IJ SOLN: 4.0000 mg | Freq: Four times a day (QID) | INTRAMUSCULAR | Status: DC | PRN

## 2017-12-16 NOTE — H&P (Signed)
History and Physical    Damon Parrish WIO:[id number] DOB: 12-Nov-1940 DOA: 12/16/2017  PCP: Tobe Sos, MD   Patient coming from: Home  Chief Complaint: Generalized weakness, abdominal pain, and constipation  HPI: Damon Parrish is a 77 y.o. male with medical history significant for hypertension, dyslipidemia, CVA, and prior seizures who presents to the emergency department for worsening generalized fatigue and weakness over about 1 month along with abdominal pain and distention with chronic constipation for the last 2-4 weeks.  He has tried multiple laxatives per his primary care physician with very little success and was recommended to see GI soon in the outpatient setting.  He is very concerned about his bowel movements and has not had one since last Thursday.  As a result of his abdominal discomfort, he has had poor oral intake and only eats cereal for breakfast each day since this appears to cause him worsening pain and discomfort.  He also has a sense of early satiety. Denies chest pain, shortness of breath, or lower extremity edema. Denies blood in stools, nausea, vomiting, or hematemesis.   ED Course: Vital signs are stable with mildly elevated blood pressure readings.  Laboratory data with leukocytosis of 14,400, potassium 3.4, and most notably sodium 122.  Patient has received a 500 cc IV fluid normal saline bolus.  He has had a CT of the abdomen and pelvis with contrast with no acute findings aside from some gallstones, minimal right pleural effusion, some findings of sigmoid diverticulitis, and moderate amounts of stool present.  He otherwise generally appears comfortable.  Review of Systems: All others reviewed and otherwise negative.  Past Medical History:  Diagnosis Date  . Arthritis    "generalized" (12/28/2015)  . Chronic sinus complaints   . GERD (gastroesophageal reflux disease)   . H/O cerebral aneurysm repair 2016   "put stent in"  . High cholesterol   .  Hypertension   . Kidney stones   . Nonruptured cerebral aneurysm   . Seizures ([place]) 12/27/2015; 12/28/2015  . Stroke [facility])     Past Surgical History:  Procedure Laterality Date  . CATARACT EXTRACTION W/ INTRAOCULAR LENS  IMPLANT, BILATERAL Bilateral   . COLONOSCOPY    . FOOT FRACTURE SURGERY Left    "pins, etc. in there"  . FRACTURE SURGERY    . RADIOLOGY WITH ANESTHESIA N/A 05/04/2015   Procedure: Pipeline Embolization;  Surgeon: Consuella Lose, MD;  Location: [facility];  Service: Radiology;  Laterality: N/A;  . RADIOLOGY WITH ANESTHESIA N/A 05/31/2015   Procedure: Pipeline Embolization;  Surgeon: Consuella Lose, MD;  Location: [facility];  Service: Radiology;  Laterality: N/A;     reports that he has been smoking cigarettes.  He has a 25.00 pack-year smoking history. He has never used smokeless tobacco. He reports that he does not drink alcohol or use drugs.  No Known Allergies  Family History  Problem Relation Age of Onset  . Lymphoma Mother   . Dementia Mother   . Aneurysm Father     Prior to Admission medications   Medication Sig Start Date End Date Taking? Authorizing Provider  Ascorbic Acid (VITAMIN C) 1000 MG tablet Take 1,000 mg by mouth daily.   Yes [provider]  aspirin 325 MG tablet Take 325 mg by mouth daily.   Yes [provider]  atorvastatin (LIPITOR) 40 MG tablet Take 40 mg by mouth every other day. At bedtime   Yes [provider]  beta carotene w/minerals (OCUVITE) tablet  Take 1 tablet by mouth daily.   Yes [provider]  Brinzolamide-Brimonidine (SIMBRINZA) 1-0.2 % SUSP Place 1 drop into both eyes every evening. Morning and 8pm   Yes [provider]  cholecalciferol (VITAMIN D) 1000 UNITS tablet Take 1,000 Units by mouth daily.   Yes [provider]  diphenhydramine-acetaminophen (TYLENOL PM) 25-500 MG TABS tablet Take 2 tablets by mouth at bedtime as needed.   Yes [provider]  fluticasone  (FLONASE) 50 MCG/ACT nasal spray Place 1 spray into both nostrils daily.   Yes [provider]  hydrochlorothiazide (HYDRODIURIL) 25 MG tablet Take 25 mg by mouth daily. 10/29/14  Yes [provider]  Multiple Vitamin (MULTIVITAMIN WITH MINERALS) TABS tablet Take 1 tablet by mouth daily. Centrum Silver   Yes [provider]  TRAVATAN Z 0.004 % SOLN ophthalmic solution Place 1 drop into both eyes 2 (two) times daily.  02/27/15  Yes [provider]  vitamin E 400 UNIT capsule Take 400 Units by mouth daily.   Yes [provider]    Physical Exam: Vitals:   12/16/17 1400 12/16/17 1430 12/16/17 1530 12/16/17 1600  BP: 126/81 132/77 (!) 146/87 (!) 165/80  Pulse: 82 84 87 88  Resp:  15  18  Temp:      TempSrc:      SpO2: 97% 97% 98% 97%  Weight:      Height:        Constitutional: NAD, calm, comfortable Vitals:   12/16/17 1400 12/16/17 1430 12/16/17 1530 12/16/17 1600  BP: 126/81 132/77 (!) 146/87 (!) 165/80  Pulse: 82 84 87 88  Resp:  15  18  Temp:      TempSrc:      SpO2: 97% 97% 98% 97%  Weight:      Height:       Eyes: lids and conjunctivae normal ENMT: Mucous membranes are dry.  Neck: normal, supple Respiratory: clear to auscultation bilaterally. Normal respiratory effort. No accessory muscle use.  Cardiovascular: Regular rate and rhythm, no murmurs. No extremity edema. Abdomen: minimal tenderness, no significant distention. Bowel sounds positive.  Musculoskeletal:  No joint deformity upper and lower extremities.   Skin: no rashes, lesions, ulcers.  Psychiatric: Normal judgment and insight. Alert and oriented x 3. Normal mood.   Labs on Admission: I have personally reviewed following labs and imaging studies  CBC: Recent Labs  Lab 12/16/17 1318  WBC 14.4*  NEUTROABS 11.7  HGB 14.7  HCT 41.7  MCV 93.5  PLT PLATELET CLUMPS NOTED ON SMEAR, COUNT APPEARS ADEQUATE   Basic Metabolic Panel: Recent Labs  Lab 12/16/17 1318  NA  122*  K 3.4*  CL 85*  CO2 19*  GLUCOSE 103*  BUN 12  CREATININE 0.81  CALCIUM 9.1   GFR: Estimated Creatinine Clearance: 75.2 mL/min (by C-G formula based on SCr of 0.81 mg/dL). Liver Function Tests: Recent Labs  Lab 12/16/17 1318  AST 28  ALT 28  ALKPHOS 109  BILITOT 0.9  PROT 7.4  ALBUMIN 3.5   No results for input(s): LIPASE, AMYLASE in the last 168 hours. No results for input(s): AMMONIA in the last 168 hours. Coagulation Profile: No results for input(s): INR, PROTIME in the last 168 hours. Cardiac Enzymes: No results for input(s): CKTOTAL, CKMB, CKMBINDEX, TROPONINI in the last 168 hours. BNP (last 3 results) No results for input(s): PROBNP in the last 8760 hours. HbA1C: No results for input(s): HGBA1C in the last 72 hours. CBG: No results for  input(s): GLUCAP in the last 168 hours. Lipid Profile: No results for input(s): CHOL, HDL, LDLCALC, TRIG, CHOLHDL, LDLDIRECT in the last 72 hours. Thyroid Function Tests: No results for input(s): TSH, T4TOTAL, FREET4, T3FREE, THYROIDAB in the last 72 hours. Anemia Panel: No results for input(s): VITAMINB12, FOLATE, FERRITIN, TIBC, IRON, RETICCTPCT in the last 72 hours. Urine analysis:    Component Value Date/Time   COLORURINE YELLOW 12/16/2017 1323   APPEARANCEUR HAZY (A) 12/16/2017 1323   LABSPEC 1.013 12/16/2017 1323   PHURINE 7.0 12/16/2017 1323   GLUCOSEU NEGATIVE 12/16/2017 1323   HGBUR NEGATIVE 12/16/2017 1323   BILIRUBINUR NEGATIVE 12/16/2017 1323   KETONESUR 20 (A) 12/16/2017 1323   PROTEINUR 30 (A) 12/16/2017 1323   UROBILINOGEN 0.2 05/04/2015 1043   NITRITE NEGATIVE 12/16/2017 1323   LEUKOCYTESUR NEGATIVE 12/16/2017 1323    Radiological Exams on Admission: Ct Abdomen Pelvis W Contrast  Result Date: 12/16/2017 CLINICAL DATA:  Lower abdominal and back pain for 2 weeks EXAM: CT ABDOMEN AND PELVIS WITH CONTRAST TECHNIQUE: Multidetector CT imaging of the abdomen and pelvis was performed using the standard  protocol following bolus administration of intravenous contrast. CONTRAST:  119mL ISOVUE-300 IOPAMIDOL (ISOVUE-300) INJECTION 61% COMPARISON:  None. FINDINGS: Lower chest: Lung bases demonstrate no acute consolidation. Trace right-sided pleural effusion. Normal heart size. Hepatobiliary: Probable focal fat infiltration near the falciform ligament. Dilated gallbladder with small stones. No biliary dilatation Pancreas: Unremarkable. No pancreatic ductal dilatation or surrounding inflammatory changes. Spleen: Calcified granuloma Adrenals/Urinary Tract: Adrenal glands are within normal limits. Kidneys show no hydronephrosis. Bladder unremarkable Stomach/Bowel: Stomach is nonenlarged. Fluid-filled small bowel without convincing evidence for obstruction. Transverse orientation of cecum. Moderate stool in the right colon. Normal appendix. Sigmoid colon diverticula without acute inflammation Vascular/Lymphatic: Moderate to severe aortic atherosclerosis. No aneurysmal dilatation. No significantly enlarged lymph nodes Reproductive: Enlarged prostate gland with mass effect on the base of the bladder Other: Negative for free air or free fluid. Small fat in the umbilical region Musculoskeletal: Advanced degenerative changes of the lumbar spine with sclerosis and disc space narrowing. Moderate canal stenosis at L3-L4 and marked canal stenosis at L4-L5. IMPRESSION: 1. No CT evidence for acute intra-abdominal or pelvic abnormality. Moderate stool in the right colon. 2. Sigmoid colon diverticular disease without acute inflammation 3. Gallstones 4. Prostatomegaly 5. Trace right pleural effusion Electronically Signed   By: Donavan Foil M.D.   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 Progressive  weakness and fatigue likely secondary to hyponatremia.  I believe this is secondary to a combination of ongoing hydrochlorothiazide use as well as some noted dehydration with poor oral intake recently.  Will discontinue hydrochlorothiazide and maintain on IV normal saline at a gentle rate with BMP every 6 hoursx 4.  Goal for 6-8 mEq increase in a 24-hour period.  We will also check TSH, urine sodium, and urine osmolarity along with serum osm for full evaluation. 2. Chronic constipation.  Will utilize SMOG enema to help with bowel movement with bedside commode.  Further evaluation with GI appreciated.  He may benefit from some possible Linzess.  I do not feel that he has any component of diverticulitis currently despite the mild leukocytosis. 3. Hypertension.  With hold hydrochlorothiazide as noted above.  Place on amlodipine 5 mg daily  with hydralazine for breakthrough and monitor carefully. 4. Prior CVA/dyslipidemia.  Hold statin medication on account of abdominal pain currently.  Continue on full dose aspirin.   DVT prophylaxis: Lovenox Code Status: DNR Family Communication: Wife at bedside Disposition Plan: IV fluid with normal saline for hyponatremia treatment.  GI evaluation of chronic constipation issues that have persisted despite outpatient treatment. Consults called: GI on computer Admission status: Observation, MedSurg   Rodena Goldmann DO [facility] Pager [phone]  If 7PM-7AM, please contact night-coverage [email or web address] Password [facility]  12/16/2017, 5:04 PM

## 2017-12-16 NOTE — ED Notes (Signed)
Pt transported to CT ?

## 2017-12-16 NOTE — ED Provider Notes (Signed)
[facility] Provider Note   CSN: [id number] Arrival date & time: 12/16/17  0901     History   Chief Complaint Chief Complaint  Patient presents with  . Abdominal Pain    HPI Damon Parrish is a 77 y.o. male.  Pt complains of weakness and constipation  The history is provided by the patient. No language interpreter was used.  Abdominal Pain   This is a recurrent problem. The problem occurs constantly. The problem has not changed since onset.The pain is associated with an unknown factor. The pain is located in the generalized abdominal region. The quality of the pain is aching. The pain is at a severity of 2/10. The pain is mild. Associated symptoms include anorexia. Pertinent negatives include diarrhea, frequency, hematuria and headaches. Nothing aggravates the symptoms. Nothing relieves the symptoms.    Past Medical History:  Diagnosis Date  . Arthritis    "generalized" (12/28/2015)  . Chronic sinus complaints   . GERD (gastroesophageal reflux disease)   . H/O cerebral aneurysm repair 2016   "put stent in"  . High cholesterol   . Hypertension   . Kidney stones   . Nonruptured cerebral aneurysm   . Seizures ([place]) 12/27/2015; 12/28/2015  . Stroke [facility])     Patient Active Problem List   Diagnosis Date Noted  . Cerebrovascular accident (CVA) due to embolism of right middle cerebral artery ([place]) 03/06/2016  . S/P cerebral aneurysm repair 03/06/2016  . 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 Palpitations 01/05/2016  . Aneurysm, cerebral, nonruptured   . Hyperlipidemia   . Acute CVA (cerebrovascular accident) ([place]) 12/30/2015  . Acute right MCA stroke ([place])   . Focal seizure ([place]) 12/28/2015  . Essential hypertension 12/28/2015  . Hyponatremia 12/28/2015  . Cerebral aneurysm 05/04/2015    Past Surgical History:  Procedure Laterality Date  . CATARACT EXTRACTION W/ INTRAOCULAR LENS  IMPLANT, BILATERAL Bilateral   . COLONOSCOPY    . FOOT FRACTURE SURGERY Left     "pins, etc. in there"  . FRACTURE SURGERY    . RADIOLOGY WITH ANESTHESIA N/A 05/04/2015   Procedure: Pipeline Embolization;  Surgeon: Consuella Lose, MD;  Location: [facility];  Service: Radiology;  Laterality: N/A;  . RADIOLOGY WITH ANESTHESIA N/A 05/31/2015   Procedure: Pipeline Embolization;  Surgeon: Consuella Lose, MD;  Location: [facility];  Service: Radiology;  Laterality: N/A;        Home Medications    Prior to Admission medications   Medication Sig Start Date End Date Taking? Authorizing Provider  Ascorbic Acid (VITAMIN C) 1000 MG tablet Take 1,000 mg by mouth daily.   Yes [provider]  aspirin 325 MG tablet Take 325 mg by mouth daily.   Yes [provider]  atorvastatin (LIPITOR) 40 MG tablet Take 40 mg by mouth every other day. At bedtime   Yes [provider]  beta carotene w/minerals (OCUVITE) tablet Take 1 tablet by mouth daily.   Yes [provider]  Brinzolamide-Brimonidine (SIMBRINZA) 1-0.2 % SUSP Place 1 drop into both eyes every evening. Morning and 8pm   Yes [provider]  cholecalciferol (VITAMIN D) 1000 UNITS tablet Take 1,000 Units by mouth daily.   Yes [provider]  diphenhydramine-acetaminophen (TYLENOL PM) 25-500 MG TABS tablet Take 2 tablets by mouth at bedtime as needed.   Yes [provider]  fluticasone (FLONASE) 50 MCG/ACT nasal spray Place 1 spray into both nostrils daily.   Yes [provider]  hydrochlorothiazide (HYDRODIURIL)  25 MG tablet Take 25 mg by mouth daily. 10/29/14  Yes [provider]  Multiple Vitamin (MULTIVITAMIN WITH MINERALS) TABS tablet Take 1 tablet by mouth daily. Centrum Silver   Yes [provider]  TRAVATAN Z 0.004 % SOLN ophthalmic solution Place 1 drop into both eyes 2 (two) times daily.  02/27/15  Yes [provider]  vitamin E 400 UNIT capsule Take 400 Units by mouth daily.   Yes [provider]    Family  History Family History  Problem Relation Age of Onset  . Lymphoma Mother   . Dementia Mother   . Aneurysm Father     Social History Social History   Tobacco Use  . Smoking status: Current Every Day Smoker    Packs/day: 0.50    Years: 50.00    Pack years: 25.00    Types: Cigarettes  . Smokeless tobacco: Never Used  . Tobacco comment: smokes 1/2 pack a day  Substance Use Topics  . Alcohol use: No    Alcohol/week: 0.0 oz  . Drug use: No     Allergies   Patient has no known allergies.   Review of Systems Review of Systems  Constitutional: Negative for appetite change and fatigue.  HENT: Negative for congestion, ear discharge and sinus pressure.   Eyes: Negative for discharge.  Respiratory: Negative for cough.   Cardiovascular: Negative for chest pain.  Gastrointestinal: Positive for abdominal pain and anorexia. Negative for diarrhea.  Genitourinary: Negative for frequency and hematuria.  Musculoskeletal: Negative for back pain.  Skin: Negative for rash.  Neurological: Negative for seizures and headaches.  Psychiatric/Behavioral: Negative for hallucinations.     Physical Exam Updated Vital Signs BP (!) 165/80   Pulse 88   Temp 98.5 F (36.9 C) (Oral)   Resp 18   Ht 5' 8.5" (1.74 m)   Wt 74.4 kg (164 lb)   SpO2 97%   BMI 24.57 kg/m   Physical Exam  Constitutional: He is oriented to person, place, and time. He appears well-developed.  HENT:  Head: Normocephalic.  Eyes: Conjunctivae and EOM are normal. No scleral icterus.  Neck: Neck supple. No thyromegaly present.  Cardiovascular: Normal rate and regular rhythm. Exam reveals no gallop and no friction rub.  No murmur heard. Pulmonary/Chest: No stridor. He has no wheezes. He has no rales. He exhibits no tenderness.  Abdominal: He exhibits no distension. There is tenderness. There is no rebound.  Musculoskeletal: Normal range of motion. He exhibits no edema.  Lymphadenopathy:    He has no cervical  adenopathy.  Neurological: He is oriented to person, place, and time. He exhibits normal muscle tone. Coordination normal.  Skin: No rash noted. No erythema.  Psychiatric: He has a normal mood and affect. His behavior is normal.     ED Treatments / Results  Labs (all labs ordered are listed, but only abnormal results are displayed) Labs Reviewed  CBC WITH DIFFERENTIAL/PLATELET - Abnormal; Notable for the following components:      Result Value   WBC 14.4 (*)    All other components within normal limits  COMPREHENSIVE METABOLIC PANEL - Abnormal; Notable for the following components:   Sodium 122 (*)    Potassium 3.4 (*)    Chloride 85 (*)    CO2 19 (*)    Glucose, Bld 103 (*)    Anion gap 18 (*)    All other components within normal limits  URINALYSIS, ROUTINE W REFLEX MICROSCOPIC - Abnormal; Notable for the following  components:   APPearance HAZY (*)    Ketones, ur 20 (*)    Protein, ur 30 (*)    All other components within normal limits    EKG None  Radiology Ct Abdomen Pelvis W Contrast  Result Date: 12/16/2017 CLINICAL DATA:  Lower abdominal and back pain for 2 weeks EXAM: CT ABDOMEN AND PELVIS WITH CONTRAST TECHNIQUE: Multidetector CT imaging of the abdomen and pelvis was performed using the standard protocol following bolus administration of intravenous contrast. CONTRAST:  130mL ISOVUE-300 IOPAMIDOL (ISOVUE-300) INJECTION 61% COMPARISON:  None. FINDINGS: Lower chest: Lung bases demonstrate no acute consolidation. Trace right-sided pleural effusion. Normal heart size. Hepatobiliary: Probable focal fat infiltration near the falciform ligament. Dilated gallbladder with small stones. No biliary dilatation Pancreas: Unremarkable. No pancreatic ductal dilatation or surrounding inflammatory changes. Spleen: Calcified granuloma Adrenals/Urinary Tract: Adrenal glands are within normal limits. Kidneys show no hydronephrosis. Bladder unremarkable Stomach/Bowel: Stomach is nonenlarged.  Fluid-filled small bowel without convincing evidence for obstruction. Transverse orientation of cecum. Moderate stool in the right colon. Normal appendix. Sigmoid colon diverticula without acute inflammation Vascular/Lymphatic: Moderate to severe aortic atherosclerosis. No aneurysmal dilatation. No significantly enlarged lymph nodes Reproductive: Enlarged prostate gland with mass effect on the base of the bladder Other: Negative for free air or free fluid. Small fat in the umbilical region Musculoskeletal: Advanced degenerative changes of the lumbar spine with sclerosis and disc space narrowing. Moderate canal stenosis at L3-L4 and marked canal stenosis at L4-L5. IMPRESSION: 1. No CT evidence for acute intra-abdominal or pelvic abnormality. Moderate stool in the right colon. 2. Sigmoid colon diverticular disease without acute inflammation 3. Gallstones 4. Prostatomegaly 5. Trace right pleural effusion Electronically Signed   By: Donavan Foil M.D.   On: 12/16/2017 15:22    Procedures Procedures (including critical care time)  Medications Ordered in ED Medications  HYDROmorphone (DILAUDID) injection 0.5 mg (0.5 mg Intravenous Given 12/16/17 1336)  ondansetron (ZOFRAN) injection 4 mg (4 mg Intravenous Given 12/16/17 1336)  sodium chloride 0.9 % bolus 500 mL (0 mLs Intravenous Stopped 12/16/17 1619)  iopamidol (ISOVUE-300) 61 % injection 100 mL (100 mLs Intravenous Contrast Given 12/16/17 1452)     Initial Impression / Assessment and Plan / ED Course  I have reviewed the triage vital signs and the nursing notes.  Pertinent labs & imaging results that were available during my care of the patient were reviewed by me and considered in my medical decision making (see chart for details).     Pt with mild constipation and hyponatremia.  Pt will be admitted to medicine  Final Clinical Impressions(s) / ED Diagnoses   Final diagnoses:  Hyponatremia    ED Discharge Orders    None       Milton Ferguson, MD 12/16/17 1643

## 2017-12-16 NOTE — ED Notes (Signed)
EDP at bedside updating patient and family. 

## 2017-12-16 NOTE — ED Triage Notes (Addendum)
Lower abdominal and back pain times 2 weeks.  Per wife pt has been constipated times one month.   Seen here yesterday with same but LWBS due to long wait time.

## 2017-12-16 NOTE — ED Notes (Signed)
Pt returned from CT °

## 2017-12-17 ENCOUNTER — Encounter (HOSPITAL_COMMUNITY): Payer: Self-pay | Admitting: Gastroenterology

## 2017-12-17 ENCOUNTER — Inpatient Hospital Stay (HOSPITAL_COMMUNITY): Payer: Medicare Other

## 2017-12-17 ENCOUNTER — Telehealth: Payer: Self-pay | Admitting: Gastroenterology

## 2017-12-17 DIAGNOSIS — G062 Extradural and subdural abscess, unspecified: Secondary | ICD-10-CM

## 2017-12-17 DIAGNOSIS — F17210 Nicotine dependence, cigarettes, uncomplicated: Secondary | ICD-10-CM | POA: Diagnosis present

## 2017-12-17 DIAGNOSIS — K5909 Other constipation: Secondary | ICD-10-CM | POA: Diagnosis present

## 2017-12-17 DIAGNOSIS — I10 Essential (primary) hypertension: Secondary | ICD-10-CM

## 2017-12-17 DIAGNOSIS — E871 Hypo-osmolality and hyponatremia: Secondary | ICD-10-CM

## 2017-12-17 DIAGNOSIS — T368X5A Adverse effect of other systemic antibiotics, initial encounter: Secondary | ICD-10-CM | POA: Diagnosis not present

## 2017-12-17 DIAGNOSIS — K625 Hemorrhage of anus and rectum: Secondary | ICD-10-CM | POA: Diagnosis present

## 2017-12-17 DIAGNOSIS — D721 Eosinophilia: Secondary | ICD-10-CM | POA: Diagnosis not present

## 2017-12-17 DIAGNOSIS — M4640 Discitis, unspecified, site unspecified: Secondary | ICD-10-CM | POA: Diagnosis present

## 2017-12-17 DIAGNOSIS — G061 Intraspinal abscess and granuloma: Secondary | ICD-10-CM | POA: Diagnosis present

## 2017-12-17 DIAGNOSIS — R1084 Generalized abdominal pain: Secondary | ICD-10-CM | POA: Diagnosis present

## 2017-12-17 DIAGNOSIS — G4089 Other seizures: Secondary | ICD-10-CM | POA: Diagnosis present

## 2017-12-17 DIAGNOSIS — Z7951 Long term (current) use of inhaled steroids: Secondary | ICD-10-CM | POA: Diagnosis not present

## 2017-12-17 DIAGNOSIS — M4624 Osteomyelitis of vertebra, thoracic region: Secondary | ICD-10-CM | POA: Diagnosis present

## 2017-12-17 DIAGNOSIS — E860 Dehydration: Secondary | ICD-10-CM | POA: Diagnosis present

## 2017-12-17 DIAGNOSIS — Z8673 Personal history of transient ischemic attack (TIA), and cerebral infarction without residual deficits: Secondary | ICD-10-CM | POA: Diagnosis not present

## 2017-12-17 DIAGNOSIS — K5900 Constipation, unspecified: Secondary | ICD-10-CM | POA: Diagnosis not present

## 2017-12-17 DIAGNOSIS — R140 Abdominal distension (gaseous): Secondary | ICD-10-CM | POA: Diagnosis not present

## 2017-12-17 DIAGNOSIS — I63411 Cerebral infarction due to embolism of right middle cerebral artery: Secondary | ICD-10-CM | POA: Diagnosis not present

## 2017-12-17 DIAGNOSIS — E785 Hyperlipidemia, unspecified: Secondary | ICD-10-CM | POA: Diagnosis present

## 2017-12-17 DIAGNOSIS — G9529 Other cord compression: Secondary | ICD-10-CM | POA: Diagnosis present

## 2017-12-17 DIAGNOSIS — K921 Melena: Secondary | ICD-10-CM | POA: Diagnosis not present

## 2017-12-17 DIAGNOSIS — R339 Retention of urine, unspecified: Secondary | ICD-10-CM | POA: Diagnosis present

## 2017-12-17 DIAGNOSIS — M4644 Discitis, unspecified, thoracic region: Secondary | ICD-10-CM

## 2017-12-17 DIAGNOSIS — K5730 Diverticulosis of large intestine without perforation or abscess without bleeding: Secondary | ICD-10-CM | POA: Diagnosis present

## 2017-12-17 DIAGNOSIS — Z7982 Long term (current) use of aspirin: Secondary | ICD-10-CM | POA: Diagnosis not present

## 2017-12-17 DIAGNOSIS — R21 Rash and other nonspecific skin eruption: Secondary | ICD-10-CM | POA: Diagnosis not present

## 2017-12-17 DIAGNOSIS — R740 Nonspecific elevation of levels of transaminase and lactic acid dehydrogenase [LDH]: Secondary | ICD-10-CM | POA: Diagnosis not present

## 2017-12-17 DIAGNOSIS — E7800 Pure hypercholesterolemia, unspecified: Secondary | ICD-10-CM | POA: Diagnosis present

## 2017-12-17 DIAGNOSIS — L270 Generalized skin eruption due to drugs and medicaments taken internally: Secondary | ICD-10-CM | POA: Diagnosis not present

## 2017-12-17 DIAGNOSIS — Z881 Allergy status to other antibiotic agents status: Secondary | ICD-10-CM | POA: Diagnosis not present

## 2017-12-17 DIAGNOSIS — K219 Gastro-esophageal reflux disease without esophagitis: Secondary | ICD-10-CM | POA: Diagnosis present

## 2017-12-17 DIAGNOSIS — Z66 Do not resuscitate: Secondary | ICD-10-CM | POA: Diagnosis present

## 2017-12-17 DIAGNOSIS — Z8679 Personal history of other diseases of the circulatory system: Secondary | ICD-10-CM | POA: Diagnosis not present

## 2017-12-17 DIAGNOSIS — Z95828 Presence of other vascular implants and grafts: Secondary | ICD-10-CM | POA: Diagnosis not present

## 2017-12-17 HISTORY — DX: Extradural and subdural abscess, unspecified: G06.2

## 2017-12-17 LAB — GLUCOSE, CAPILLARY: Glucose-Capillary: 118 mg/dL — ABNORMAL HIGH (ref 65–99)

## 2017-12-17 LAB — BASIC METABOLIC PANEL
Anion gap: 16 — ABNORMAL HIGH (ref 5–15)
BUN: 12 mg/dL (ref 6–20)
CO2: 21 mmol/L — ABNORMAL LOW (ref 22–32)
Calcium: 8.8 mg/dL — ABNORMAL LOW (ref 8.9–10.3)
Chloride: 89 mmol/L — ABNORMAL LOW (ref 101–111)
Creatinine, Ser: 0.8 mg/dL (ref 0.61–1.24)
GFR calc Af Amer: >60 mL/min (ref >60)
GFR calc non Af Amer: >60 mL/min (ref >60)
GLUCOSE: 124 mg/dL — AB (ref 65–99)
POTASSIUM: 2.8 mmol/L — AB (ref 3.5–5.1)
Sodium: 126 mmol/L — ABNORMAL LOW (ref 135–145)

## 2017-12-17 LAB — CBC
HCT: 38.8 % — ABNORMAL LOW (ref 39.0–52.0)
Hemoglobin: 13.2 g/dL (ref 13.0–17.0)
MCH: 32.3 pg (ref 26.0–34.0)
MCHC: 34 g/dL (ref 30.0–36.0)
MCV: 94.9 fL (ref 78.0–100.0)
PLATELETS: 455 10*3/uL — AB (ref 150–400)
RBC: 4.09 MIL/uL — ABNORMAL LOW (ref 4.22–5.81)
RDW: 12.5 % (ref 11.5–15.5)
WBC: 11 10*3/uL — ABNORMAL HIGH (ref 4.0–10.5)

## 2017-12-17 LAB — OSMOLALITY, URINE: Osmolality, Ur: 416 mOsm/kg (ref 300–900)

## 2017-12-17 LAB — MAGNESIUM: Magnesium: 1.8 mg/dL (ref 1.7–2.4)

## 2017-12-17 MED ORDER — GADOBENATE DIMEGLUMINE 529 MG/ML IV SOLN
15.0000 mL | Freq: Once | INTRAVENOUS | Status: AC | PRN
  Administered 2017-12-17: 15 mL via INTRAVENOUS

## 2017-12-17 MED ORDER — POTASSIUM CHLORIDE CRYS ER 20 MEQ PO TBCR
60.0000 meq | EXTENDED_RELEASE_TABLET | Freq: Once | ORAL | Status: AC
  Administered 2017-12-17: 60 meq via ORAL
  Filled 2017-12-17: qty 3

## 2017-12-17 MED ORDER — SODIUM CHLORIDE 0.9 % IV SOLN
INTRAVENOUS | Status: DC
  Administered 2017-12-17 – 2017-12-18 (×2): via INTRAVENOUS

## 2017-12-17 MED ORDER — SODIUM CHLORIDE 0.9 % IV SOLN
2.0000 g | INTRAVENOUS | Status: DC
  Administered 2017-12-17: 2 g via INTRAVENOUS
  Filled 2017-12-17: qty 2
  Filled 2017-12-17: qty 20

## 2017-12-17 MED ORDER — VANCOMYCIN HCL 10 G IV SOLR
1500.0000 mg | Freq: Once | INTRAVENOUS | Status: AC
  Administered 2017-12-17: 1500 mg via INTRAVENOUS
  Filled 2017-12-17: qty 1500

## 2017-12-17 MED ORDER — SODIUM CHLORIDE 0.9 % IV SOLN: 2.0000 g | INTRAVENOUS | Status: DC

## 2017-12-17 MED ORDER — VANCOMYCIN HCL IN DEXTROSE 750-5 MG/150ML-% IV SOLN: 750.0000 mg | Freq: Two times a day (BID) | INTRAVENOUS | Status: DC

## 2017-12-17 MED ORDER — POTASSIUM CHLORIDE 20 MEQ/15ML (10%) PO SOLN: 40.0000 meq | Freq: Every day | ORAL | 0 refills | Status: DC

## 2017-12-17 MED ORDER — OCUVITE-LUTEIN PO CAPS: 1.0000 | ORAL_CAPSULE | Freq: Every day | ORAL | 0 refills | Status: DC

## 2017-12-17 MED ORDER — AMLODIPINE BESYLATE 5 MG PO TABS: 5.0000 mg | ORAL_TABLET | Freq: Every day | ORAL | Status: DC

## 2017-12-17 MED ORDER — VANCOMYCIN HCL IN DEXTROSE 750-5 MG/150ML-% IV SOLN
750.0000 mg | Freq: Two times a day (BID) | INTRAVENOUS | Status: DC
  Administered 2017-12-18: 750 mg via INTRAVENOUS
  Filled 2017-12-17 (×2): qty 150

## 2017-12-17 NOTE — Discharge Summary (Addendum)
Physician Discharge Summary  Damon Parrish ION:[id number] DOB: 01/15/1941 DOA: 12/16/2017  PCP: Tobe Sos, MD  Admit date: 12/16/2017 Discharge date: 12/17/2017  Admitted From: HOME  Disposition: TRANSFER TO [place]   Discharge Condition: STABLE, GUARDED  CODE STATUS: DNR   Brief Hospitalization Summary: Please see all hospital notes, images, labs for full details of the hospitalization.  HPI: Damon Parrish is a 77 y.o. male with medical history significant for hypertension, dyslipidemia, CVA, and prior seizures who presents to the emergency department for worsening generalized fatigue and weakness over about 1 month along with abdominal pain and distention with chronic constipation for the last 2-4 weeks.  He has tried multiple laxatives per his primary care physician with very little success and was recommended to see GI soon in the outpatient setting.  He is very concerned about his bowel movements and has not had one since last Thursday.  As a result of his abdominal discomfort, he has had poor oral intake and only eats cereal for breakfast each day since this appears to cause him worsening pain and discomfort.  He also has a sense of early satiety. Denies chest pain, shortness of breath, or lower extremity edema. Denies blood in stools, nausea, vomiting, or hematemesis.   ED Course: Vital signs are stable with mildly elevated blood pressure readings.  Laboratory data with leukocytosis of 14,400, potassium 3.4, and most notably sodium 122.  Patient has received a 500 cc IV fluid normal saline bolus.  He has had a CT of the abdomen and pelvis with contrast with no acute findings aside from some gallstones, minimal right pleural effusion, some findings of sigmoid diverticulitis, and moderate amounts of stool present.  He otherwise generally appears comfortable.  Brief Admission Hx: Damon Parrish a 77 y.o.malewith medical history significant forhypertension,  dyslipidemia, CVA, and prior seizures who presents to the emergency department for worsening generalized fatigue and weakness over about 1 month along with abdominal pain and distention with chronic constipation for the last 2-4 weeks.  He also complained of worsening low back pain.  He is having to lay flat in bed due to pain.  He denies fever and chills.  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    MDM/Assessment & Plan:   1. 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  Pt and wife updated at bedside.   I spoke with Dr. Ronnald Ramp who requested patient transfer to [place] and he will consult. Unfortunately there are no beds available at [facility].  Dr. Ronnald Ramp requested transfer to another institution.  I called [facility] and they have accepted patient in transfer to their facility.   I have made arrangements to have discs of film images sent with patient.  2. Hyponatremia - slowly improving with IV hydration and holding HCTZ.   3. Chronic constipation - Pt has been given enemas and constipation has been relieved.  GI has been consulted and following him in hospital.   4. Hypertension - Holding HCTZ, continue amlodipine. BP stable.  5. Cerebrovascular disease -  He is on aspirin for secondary prevention.    6. Abdominal pain - improved after laxative therapy.   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[place])   Essential hypertension   Hyperlipidemia   Cerebrovascular accident (CVA) due to embolism of right middle cerebral artery ([place])   Constipation   Rectal bleeding  Discharge Instructions: Discharge Instructions    Increase activity slowly   Complete by:  As directed      Allergies as of 12/17/2017   No Known Allergies     Medication List    STOP taking these medications   beta carotene w/minerals tablet Replaced by:  multivitamin-lutein Caps capsule   cholecalciferol 1000 units tablet Commonly known as:  VITAMIN D   diphenhydramine-acetaminophen 25-500 MG Tabs tablet Commonly known as:  TYLENOL PM   fluticasone 50 MCG/ACT nasal spray Commonly known as:  FLONASE   hydrochlorothiazide 25 MG tablet Commonly known as:  HYDRODIURIL   multivitamin with minerals Tabs tablet   vitamin C 1000 MG tablet   vitamin E 400 UNIT capsule     TAKE these medications   amLODipine 5 MG tablet Commonly known as:  NORVASC Take 1 tablet (5 mg total) by mouth daily. Start taking on:  12/18/2017   aspirin 325 MG tablet Take 325 mg by mouth daily.   atorvastatin 40 MG tablet Commonly known as:  LIPITOR Take 40 mg by mouth every other day. At bedtime   cefTRIAXone 2 g in sodium chloride 0.9 % 100 mL Inject 2 g into the vein daily. Start taking on:  12/18/2017   multivitamin-lutein Caps capsule Take 1 capsule by mouth daily. Start taking on:  12/18/2017 Replaces:  beta carotene w/minerals tablet   potassium chloride 20 MEQ/15ML (10%) Soln Take 30 mLs (40 mEq total) by mouth daily. Start taking on:  12/18/2017   SIMBRINZA 1-0.2 % Susp Generic drug:  Brinzolamide-Brimonidine Place 1 drop into both eyes every evening. Morning and 8pm   TRAVATAN Z 0.004 % Soln ophthalmic solution Generic drug:  Travoprost (BAK Free) Place 1 drop into both eyes 2 (two) times daily.   Vancomycin 750-5 MG/150ML-%  Soln Commonly known as:  VANCOCIN Inject 150 mLs (750 mg total) into the vein every 12 (twelve) hours. Start taking on:  12/18/2017       No Known Allergies Allergies as of 12/17/2017   No Known Allergies     Medication List    STOP taking these medications   beta carotene w/minerals tablet Replaced by:  multivitamin-lutein Caps capsule   cholecalciferol 1000 units tablet Commonly known as:  VITAMIN D   diphenhydramine-acetaminophen 25-500 MG Tabs tablet Commonly known as:  TYLENOL PM   fluticasone 50 MCG/ACT nasal spray Commonly known as:  FLONASE   hydrochlorothiazide 25 MG tablet Commonly known as:  HYDRODIURIL   multivitamin with minerals Tabs tablet   vitamin C 1000 MG tablet   vitamin E 400 UNIT capsule     TAKE these medications   amLODipine 5 MG tablet Commonly known as:  NORVASC Take 1 tablet (5 mg total) by mouth daily. Start taking on:  12/18/2017   aspirin 325 MG tablet Take 325 mg by mouth daily.   atorvastatin 40 MG tablet Commonly known as:  LIPITOR Take 40 mg by mouth every other day. At bedtime  cefTRIAXone 2 g in sodium chloride 0.9 % 100 mL Inject 2 g into the vein daily. Start taking on:  12/18/2017   multivitamin-lutein Caps capsule Take 1 capsule by mouth daily. Start taking on:  12/18/2017 Replaces:  beta carotene w/minerals tablet   potassium chloride 20 MEQ/15ML (10%) Soln Take 30 mLs (40 mEq total) by mouth daily. Start taking on:  12/18/2017   SIMBRINZA 1-0.2 % Susp Generic drug:  Brinzolamide-Brimonidine Place 1 drop into both eyes every evening. Morning and 8pm   TRAVATAN Z 0.004 % Soln ophthalmic solution Generic drug:  Travoprost (BAK Free) Place 1 drop into both eyes 2 (two) times daily.   Vancomycin 750-5 MG/150ML-% Soln Commonly known as:  VANCOCIN Inject 150 mLs (750 mg total) into the vein every 12 (twelve) hours. Start taking on:  12/18/2017       Procedures/Studies: Mr Lumbar Spine Wo Contrast  Addendum  Date: 12/17/2017   ADDENDUM REPORT: 12/17/2017 11:24 ADDENDUM: These results were called by telephone at the time of interpretation on 12/17/2017 at 11:23 am to Dr. Irwin Brakeman , who verbally acknowledged these results. Electronically Signed   By: Staci Righter M.D.   On: 12/17/2017 11:24   Result Date: 12/17/2017 CLINICAL DATA:  Several week history of back pain.  Weakness. EXAM: MRI THORACIC SPINE WITHOUT AND WITH CONTRAST MR LUMBAR SPINE WITHOUT CONTRAST TECHNIQUE: Multiplanar and multiecho pulse sequences of the thoracic spine were obtained without and with intravenous contrast. Multiplanar multi-echo pulse sequences of the lumbar spine were obtained without contrast. CONTRAST:  34mL MULTIHANCE GADOBENATE DIMEGLUMINE 529 MG/ML IV SOLN COMPARISON:  CT abdomen pelvis 12/16/2017 does not include the area of concern in the thoracic region. FINDINGS: MRI THORACIC SPINE FINDINGS Alignment:  Anatomic. Vertebrae: T9 and T10 osteomyelitis secondary to T9-10 diskitis. There is mild loss of vertebral body height at both levels without significant retropulsion. Cord: Moderate to severe cord compression due to epidural abscess at T9-10, greater on the RIGHT. Paraspinal and other soft tissues: Paravertebral soft tissue swelling at T9 and T10, greater on the RIGHT. Disc levels: Minor thoracic disc pathology with the exception of T9-10, where there is significant diskitis. MRI LUMBAR SPINE FINDINGS Segmentation:  Standard. Alignment: Straightening of the normal lumbar lordosis, but no significant subluxation. Vertebrae:  No worrisome osseous lesion.  Endplate reactive changes. Conus medullaris: Extends to the L1 level and appears normal. Paraspinal and other soft tissues: Unremarkable. Disc levels: L1-L2: Disc space narrowing. Annular bulge. Foraminal protrusion to the LEFT. LEFT L1 and LEFT L2 nerve root impingement are possible. L2-L3: Disc space narrowing. Central and leftward protrusion. Posterior element hypertrophy.  Mild stenosis. LEFT L3 nerve root impingement possible. L3-L4: Near complete loss of interspace height. Central protrusion with osseous spurring. Posterior element hypertrophy. Moderate stenosis. BILATERAL L3 and L4 nerve root impingement are possible. L4-L5: Near complete loss of interspace height. Central and leftward extrusion, caudally migrated fragment, posterior element hypertrophy. Severe stenosis. LEFT greater than RIGHT L5 nerve root impingement. BILATERAL foraminal narrowing due to posterior element hypertrophy affects the RIGHT greater than LEFT L4 nerve roots. L5-S1: Advanced disc space narrowing. Central protrusion. Posterior element hypertrophy. Disc material extends into both foramina. LEFT greater than RIGHT L5 and S1 nerve root impingement. IMPRESSION: THORACIC SPINE: T9-10 diskitis and osteomyelitis. Epidural abscess with significant cord compression. Surgical consultation is warranted. 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 LEFT greater than RIGHT  L5 nerve root impingement. A call has been placed to the ordering provider. Electronically Signed: By: Staci Righter M.D. On: 12/17/2017 11:12   Mr Thoracic Spine W Wo Contrast  Addendum Date: 12/17/2017   ADDENDUM REPORT: 12/17/2017 11:24 ADDENDUM: These results were called by telephone at the time of interpretation on 12/17/2017 at 11:23 am to Dr. Irwin Brakeman , who verbally acknowledged these results. Electronically Signed   By: Staci Righter M.D.   On: 12/17/2017 11:24   Result Date: 12/17/2017 CLINICAL DATA:  Several week history of back pain.  Weakness. EXAM: MRI THORACIC SPINE WITHOUT AND WITH CONTRAST MR LUMBAR SPINE WITHOUT CONTRAST TECHNIQUE: Multiplanar and multiecho pulse sequences of the thoracic spine were obtained without and with intravenous contrast. Multiplanar  multi-echo pulse sequences of the lumbar spine were obtained without contrast. CONTRAST:  9mL MULTIHANCE GADOBENATE DIMEGLUMINE 529 MG/ML IV SOLN COMPARISON:  CT abdomen pelvis 12/16/2017 does not include the area of concern in the thoracic region. FINDINGS: MRI THORACIC SPINE FINDINGS Alignment:  Anatomic. Vertebrae: T9 and T10 osteomyelitis secondary to T9-10 diskitis. There is mild loss of vertebral body height at both levels without significant retropulsion. Cord: Moderate to severe cord compression due to epidural abscess at T9-10, greater on the RIGHT. Paraspinal and other soft tissues: Paravertebral soft tissue swelling at T9 and T10, greater on the RIGHT. Disc levels: Minor thoracic disc pathology with the exception of T9-10, where there is significant diskitis. MRI LUMBAR SPINE FINDINGS Segmentation:  Standard. Alignment: Straightening of the normal lumbar lordosis, but no significant subluxation. Vertebrae:  No worrisome osseous lesion.  Endplate reactive changes. Conus medullaris: Extends to the L1 level and appears normal. Paraspinal and other soft tissues: Unremarkable. Disc levels: L1-L2: Disc space narrowing. Annular bulge. Foraminal protrusion to the LEFT. LEFT L1 and LEFT L2 nerve root impingement are possible. L2-L3: Disc space narrowing. Central and leftward protrusion. Posterior element hypertrophy. Mild stenosis. LEFT L3 nerve root impingement possible. L3-L4: Near complete loss of interspace height. Central protrusion with osseous spurring. Posterior element hypertrophy. Moderate stenosis. BILATERAL L3 and L4 nerve root impingement are possible. L4-L5: Near complete loss of interspace height. Central and leftward extrusion, caudally migrated fragment, posterior element hypertrophy. Severe stenosis. LEFT greater than RIGHT L5 nerve root impingement. BILATERAL foraminal narrowing due to posterior element hypertrophy affects the RIGHT greater than LEFT L4 nerve roots. L5-S1: Advanced disc space  narrowing. Central protrusion. Posterior element hypertrophy. Disc material extends into both foramina. LEFT greater than RIGHT L5 and S1 nerve root impingement. IMPRESSION: THORACIC SPINE: T9-10 diskitis and osteomyelitis. Epidural abscess with significant cord compression. Surgical consultation is warranted. 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 LEFT greater than RIGHT L5 nerve root impingement. A call has been placed to the ordering provider. Electronically Signed: By: Staci Righter M.D. On: 12/17/2017 11:12   Ct Abdomen Pelvis W Contrast  Result Date: 12/16/2017 CLINICAL DATA:  Lower abdominal and back pain for 2 weeks EXAM: CT ABDOMEN AND PELVIS WITH CONTRAST TECHNIQUE: Multidetector CT imaging of the abdomen and pelvis was performed using the standard protocol following bolus administration of intravenous contrast. CONTRAST:  127mL ISOVUE-300 IOPAMIDOL (ISOVUE-300) INJECTION 61% COMPARISON:  None. FINDINGS: Lower chest: Lung bases demonstrate no acute consolidation. Trace right-sided pleural effusion. Normal heart size. Hepatobiliary: Probable focal fat infiltration near the falciform ligament. Dilated gallbladder with small stones. No biliary dilatation Pancreas: Unremarkable. No pancreatic ductal  dilatation or surrounding inflammatory changes. Spleen: Calcified granuloma Adrenals/Urinary Tract: Adrenal glands are within normal limits. Kidneys show no hydronephrosis. Bladder unremarkable Stomach/Bowel: Stomach is nonenlarged. Fluid-filled small bowel without convincing evidence for obstruction. Transverse orientation of cecum. Moderate stool in the right colon. Normal appendix. Sigmoid colon diverticula without acute inflammation Vascular/Lymphatic: Moderate to severe aortic atherosclerosis. No aneurysmal dilatation. No significantly enlarged  lymph nodes Reproductive: Enlarged prostate gland with mass effect on the base of the bladder Other: Negative for free air or free fluid. Small fat in the umbilical region Musculoskeletal: Advanced degenerative changes of the lumbar spine with sclerosis and disc space narrowing. Moderate canal stenosis at L3-L4 and marked canal stenosis at L4-L5. IMPRESSION: 1. No CT evidence for acute intra-abdominal or pelvic abnormality. Moderate stool in the right colon. 2. Sigmoid colon diverticular disease without acute inflammation 3. Gallstones 4. Prostatomegaly 5. Trace right pleural effusion Electronically Signed   By: Donavan Foil M.D.   On: 12/16/2017 15:22      Subjective: Pt has no loss of feeling or strength in lower extremities, continues to have back pain.   Discharge Exam: Vitals:   12/17/17 0520 12/17/17 1451  BP: 136/86 (!) 158/74  Pulse: 74 84  Resp:  18  Temp: 98.6 F (37 C) 98.7 F (37.1 C)  SpO2: 99% 98%   Vitals:   12/16/17 2203 12/16/17 2226 12/17/17 0520 12/17/17 1451  BP: (!) 177/90  136/86 (!) 158/74  Pulse: 87  74 84  Resp:    18  Temp: 98.6 F (37 C)  98.6 F (37 C) 98.7 F (37.1 C)  TempSrc: Oral  Oral Oral  SpO2: 98% 98% 99% 98%  Weight: 70 kg (154 lb 5.2 oz)  70 kg (154 lb 5.2 oz)   Height:        General exam: chronically ill appearing male, awake, alert, NAD.  Respiratory system:  No increased work of breathing. Cardiovascular system: S1 & S2 heard.  No JVD, murmurs, gallops, clicks or pedal edema. Gastrointestinal system: Abdomen is nondistended, soft and nontender. Normal bowel sounds heard. Central nervous system: Alert and oriented. No focal neurological deficits. Extremities: no CCE   The results of significant diagnostics from this hospitalization (including imaging, microbiology, ancillary and laboratory) are listed below for reference.     Microbiology: Recent Results (from the past 240 hour(s))  Culture, blood (Routine X 2) w Reflex to ID  Panel     Status: None (Preliminary result)   Collection Time: 12/17/17 11:41 AM  Result Value Ref Range Status   Specimen Description BLOOD RIGHT ARM  Final   Special Requests   Final    BOTTLES DRAWN AEROBIC ONLY Blood Culture adequate volume Performed at [facility], [address], [place], [place] [address]    Culture PENDING  Incomplete   Report Status PENDING  Incomplete  Culture, blood (Routine X 2) w Reflex to ID Panel     Status: None (Preliminary result)   Collection Time: 12/17/17 11:41 AM  Result Value Ref Range Status   Specimen Description BLOOD LEFT ARM  Final   Special Requests   Final    BOTTLES DRAWN AEROBIC AND ANAEROBIC Blood Culture adequate volume Performed at [facility], [address] [name],  [address]    Culture PENDING  Incomplete   Report Status PENDING  Incomplete     Labs: BNP (last 3 results) No results for input(s): BNP in the last 8760 hours. Basic Metabolic Panel: Recent Labs  Lab 12/16/17  1318 12/16/17 1738 12/16/17 1739 12/16/17 2309 12/17/17 0609  NA 122* 126*  --  123* 126*  K 3.4* 3.2*  --  3.4* 2.8*  CL 85* 89*  --  89* 89*  CO2 19* 24  --  21* 21*  GLUCOSE 103* 106*  --  104* 124*  BUN 12 11  --  12 12  CREATININE 0.81 0.85  --  0.89 0.80  CALCIUM 9.1 9.0  --  8.7* 8.8*  MG  --   --  1.8  --  1.8   Liver Function Tests: Recent Labs  Lab 12/16/17 1318  AST 28  ALT 28  ALKPHOS 109  BILITOT 0.9  PROT 7.4  ALBUMIN 3.5   No results for input(s): LIPASE, AMYLASE in the last 168 hours. No results for input(s): AMMONIA in the last 168 hours. CBC: Recent Labs  Lab 12/16/17 1318 12/17/17 0609  WBC 14.4* 11.0*  NEUTROABS 11.7  --   HGB 14.7 13.2  HCT 41.7 38.8*  MCV 93.5 94.9  PLT PLATELET CLUMPS NOTED ON SMEAR, COUNT APPEARS ADEQUATE 455*   Cardiac Enzymes: No results for input(s): CKTOTAL, CKMB, CKMBINDEX, TROPONINI in the last 168 hours. BNP: Invalid input(s): POCBNP CBG: Recent Labs  Lab  12/16/17 2156 12/17/17 0719  GLUCAP 106* 118*   D-Dimer No results for input(s): DDIMER in the last 72 hours. Hgb A1c No results for input(s): HGBA1C in the last 72 hours. Lipid Profile No results for input(s): CHOL, HDL, LDLCALC, TRIG, CHOLHDL, LDLDIRECT in the last 72 hours. Thyroid function studies Recent Labs    12/16/17 1318  TSH 1.263   Anemia work up No results for input(s): VITAMINB12, FOLATE, FERRITIN, TIBC, IRON, RETICCTPCT in the last 72 hours. Urinalysis    Component Value Date/Time   COLORURINE YELLOW 12/16/2017 1323   APPEARANCEUR HAZY (A) 12/16/2017 1323   LABSPEC 1.013 12/16/2017 1323   PHURINE 7.0 12/16/2017 1323   GLUCOSEU NEGATIVE 12/16/2017 1323   HGBUR NEGATIVE 12/16/2017 1323   BILIRUBINUR NEGATIVE 12/16/2017 1323   KETONESUR 20 (A) 12/16/2017 1323   PROTEINUR 30 (A) 12/16/2017 1323   UROBILINOGEN 0.2 05/04/2015 1043   NITRITE NEGATIVE 12/16/2017 1323   LEUKOCYTESUR NEGATIVE 12/16/2017 1323   Sepsis Labs Invalid input(s): PROCALCITONIN,  WBC,  LACTICIDVEN Microbiology Recent Results (from the past 240 hour(s))  Culture, blood (Routine X 2) w Reflex to ID Panel     Status: None (Preliminary result)   Collection Time: 12/17/17 11:41 AM  Result Value Ref Range Status   Specimen Description BLOOD RIGHT ARM  Final   Special Requests   Final    BOTTLES DRAWN AEROBIC ONLY Blood Culture adequate volume Performed at [facility], [address], [place], [place] [address]    Culture PENDING  Incomplete   Report Status PENDING  Incomplete  Culture, blood (Routine X 2) w Reflex to ID Panel     Status: None (Preliminary result)   Collection Time: 12/17/17 11:41 AM  Result Value Ref Range Status   Specimen Description BLOOD LEFT ARM  Final   Special Requests   Final    BOTTLES DRAWN AEROBIC AND ANAEROBIC Blood Culture adequate volume Performed at [facility], [address] [place], [place] [address]    Culture PENDING  Incomplete   Report Status  PENDING  Incomplete   Time coordinating discharge: 40 mins  SIGNED:  Irwin Brakeman, MD  [facility] 12/17/2017, 6:44 PM Pager [phone]  If 7PM-7AM, please contact night-coverage [email or web address] Password  [email or web address]

## 2017-12-17 NOTE — Consult Note (Addendum)
REVIEWED. CONSTIPATION AND URINARY RETENTION LIKELY RELATED TO SPINAL CORD COMPRESSION. PT TRANSFERRED TO [facility]. OPV TO DISCUSS NEED FOR RECTAL BLEEDING THAT BEGAN WHILE HOSPITALIZED FOR CONSTIPATION AND RECEIVING ENEMAS. OBTAIN TCS REPORT FROM [place]. NO NEED TO PERFORM INTERVAL TCS AT THIS TIME. WILL REASSESS AS OUTPT IN 6-8 WEEKS.   Referring Provider: Murlean Iba, MD Primary Care Physician:  Tobe Sos, MD Primary Gastroenterologist:  Barney Drain, MD   Reason for Consultation:  Abdominal pain with persistent constipation  HPI: Damon Parrish is a 77 y.o. male with past medical history of hypertension, stroke, prior seizures who presented to the emergency department with complaints of generalized fatigue/weakness, worsening lower back and abdominal pain with distention, chronic constipation over the past 4 weeks.  He had reported trying multiple laxatives under the direction of his PCP with little results.  Had used Miralax, Senna, Fleet enema, Mag citrate, castor oil. Last bowel movement prior to admission was last Thursday.  Typically patient is used to having a bowel movement about every other day.  No history of prolonged constipation/obstipation.  Over the past several weeks has developed some low back pain which is been getting worse as well as diffuse abdominal discomfort.  The last 24 hours he has had increased difficulty emptying his bladder.  He denies any chronic pain medication, BC or Goody powder use.  Has occasionally used Aleve in the past few weeks for his back pain.  Intermittent heartburn but not frequent.  No dysphagia.  Appetite has been fine up until a few weeks ago.  In the emergency department his white blood cell count was 14,400, sodium was 122 (1 year ago was 132), potassium 3.4, TSH normal. CT abdomen pelvis with contrast yesterday showed fluid-filled small bowel but no evidence of obstruction.  Moderate stool in the right colon.  No diverticulitis.  Gallstones noted.  He received an enema last night (SMOG).  Initially had first 1 stool, this morning [place] 6, large volume.This morning he started passing small volume bright red blood in the stool.  He had at least 2 episodes of small volume brown liquid stool with some fresh blood noted while I was was present.  Today's white blood cell count is down to 11,000, sodium 126, potassium 2.8, magnesium 1.8 normal.  His last colonoscopy was reportedly about 6 years ago by Dr. Earley Brooke or Posey Pronto.  He reports everything was normal and was told to come back in 10 years.   Prior to Admission medications   Medication Sig Start Date End Date Taking? Authorizing Provider  Ascorbic Acid (VITAMIN C) 1000 MG tablet Take 1,000 mg by mouth daily.   Yes [provider]  aspirin 325 MG tablet Take 325 mg by mouth daily.   Yes [provider]  atorvastatin (LIPITOR) 40 MG tablet Take 40 mg by mouth every other day. At bedtime   Yes [provider]  beta carotene w/minerals (OCUVITE) tablet Take 1 tablet by mouth daily.   Yes [provider]  Brinzolamide-Brimonidine (SIMBRINZA) 1-0.2 % SUSP Place 1 drop into both eyes every evening. Morning and 8pm   Yes [provider]  cholecalciferol (VITAMIN D) 1000 UNITS tablet Take 1,000 Units by mouth daily.   Yes [provider]  diphenhydramine-acetaminophen (TYLENOL PM) 25-500 MG TABS tablet Take 2 tablets by mouth at bedtime as needed.   Yes [provider]  fluticasone (FLONASE) 50 MCG/ACT nasal spray Place 1 spray into both nostrils daily.   Yes [provider]  hydrochlorothiazide (HYDRODIURIL) 25 MG tablet Take 25 mg by mouth daily. 10/29/14  Yes [provider]  Multiple Vitamin (MULTIVITAMIN WITH MINERALS) TABS tablet Take 1 tablet by mouth daily. Centrum Silver   Yes [provider]  TRAVATAN Z 0.004 % SOLN ophthalmic solution Place 1 drop into both eyes 2 (two) times daily.   02/27/15  Yes [provider]  vitamin E 400 UNIT capsule Take 400 Units by mouth daily.   Yes [provider]    Current Facility-Administered Medications  Medication Dose Route Frequency Provider Last Rate Last Dose  . acetaminophen (TYLENOL) tablet 650 mg  650 mg Oral Q6H PRN Manuella Ghazi, Pratik D, DO       Or  . acetaminophen (TYLENOL) suppository 650 mg  650 mg Rectal Q6H PRN Manuella Ghazi, Pratik D, DO      . amLODipine (NORVASC) tablet 5 mg  5 mg Oral Daily Manuella Ghazi, Pratik D, DO   5 mg at 12/17/17 0929  . aspirin tablet 325 mg  325 mg Oral Daily Manuella Ghazi, Pratik D, DO   325 mg at 12/17/17 0930  . cholecalciferol (VITAMIN D) tablet 1,000 Units  1,000 Units Oral Daily Heath Lark D, DO   1,000 Units at 12/17/17 0929  . enoxaparin (LOVENOX) injection 40 mg  40 mg Subcutaneous Q24H Manuella Ghazi, Pratik D, DO   40 mg at 12/16/17 1935  . fluticasone (FLONASE) 50 MCG/ACT nasal spray 1 spray  1 spray Each Nare Daily Manuella Ghazi, Pratik D, DO   1 spray at 12/17/17 1601  . hydrALAZINE (APRESOLINE) injection 10 mg  10 mg Intravenous Q4H PRN Manuella Ghazi, Pratik D, DO      . ketorolac (TORADOL) 15 MG/ML injection 15 mg  15 mg Intravenous Q6H PRN Manuella Ghazi, Pratik D, DO   15 mg at 12/16/17 2354  . latanoprost (XALATAN) 0.005 % ophthalmic solution 1 drop  1 drop Both Eyes BID Manuella Ghazi, Pratik D, DO      . multivitamin-lutein (OCUVITE-LUTEIN) capsule 1 capsule  1 capsule Oral Daily Manuella Ghazi, Pratik D, DO   1 capsule at 12/17/17 0930  . ondansetron (ZOFRAN) tablet 4 mg  4 mg Oral Q6H PRN Manuella Ghazi, Pratik D, DO       Or  . ondansetron (ZOFRAN) injection 4 mg  4 mg Intravenous Q6H PRN Manuella Ghazi, Pratik D, DO      . potassium chloride 20 MEQ/15ML (10%) solution 40 mEq  40 mEq Oral Daily Manuella Ghazi, Pratik D, DO   40 mEq at 12/17/17 0930  . potassium chloride SA (K-DUR,KLOR-CON) CR tablet 60 mEq  60 mEq Oral Once Johnson, Clanford L, MD      . vitamin C (ASCORBIC ACID) tablet 1,000 mg  1,000 mg Oral Daily Manuella Ghazi, Pratik D, DO   1,000 mg at 12/17/17 0929  .  vitamin E capsule 400 Units  400 Units Oral Daily Heath Lark D, DO   400 Units at 12/17/17 0932    Allergies as of 12/16/2017  . (No Known Allergies)    Past Medical History:  Diagnosis Date  . Arthritis    "generalized" (12/28/2015)  . Chronic sinus complaints   . GERD (gastroesophageal reflux disease)   . H/O cerebral aneurysm repair 2016   "put stent in"  . High cholesterol   . Hypertension   . Kidney stones   . Nonruptured cerebral aneurysm   . Seizures ([place]) 12/27/2015; 12/28/2015  . Stroke [facility])     Past Surgical History:  Procedure Laterality Date  . CATARACT  EXTRACTION W/ INTRAOCULAR LENS  IMPLANT, BILATERAL Bilateral   . COLONOSCOPY    . FOOT FRACTURE SURGERY Left    "pins, etc. in there"  . FRACTURE SURGERY    . RADIOLOGY WITH ANESTHESIA N/A 05/04/2015   Procedure: Pipeline Embolization;  Surgeon: Consuella Lose, MD;  Location: [facility];  Service: Radiology;  Laterality: N/A;  . RADIOLOGY WITH ANESTHESIA N/A 05/31/2015   Procedure: Pipeline Embolization;  Surgeon: Consuella Lose, MD;  Location: [facility];  Service: Radiology;  Laterality: N/A;    Family History  Problem Relation Age of Onset  . Lymphoma Mother   . Dementia Mother   . Aneurysm Father   . Colon cancer Neg Hx     Social History   Socioeconomic History  . Marital status: Married    Spouse name: Not on file  . Number of children: Not on file  . Years of education: Not on file  . Highest education level: Not on file  Occupational History  . Not on file  Social Needs  . Financial resource strain: Not on file  . Food insecurity:    Worry: Not on file    Inability: Not on file  . Transportation needs:    Medical: Not on file    Non-medical: Not on file  Tobacco Use  . Smoking status: Current Every Day Smoker    Packs/day: 0.50    Years: 50.00    Pack years: 25.00    Types: Cigarettes  . Smokeless tobacco: Never Used  . Tobacco comment: smokes 1/2 pack a day  Substance and Sexual  Activity  . Alcohol use: No    Alcohol/week: 0.0 oz  . Drug use: No  . Sexual activity: Not on file  Lifestyle  . Physical activity:    Days per week: Not on file    Minutes per session: Not on file  . Stress: Not on file  Relationships  . Social connections:    Talks on phone: Not on file    Gets together: Not on file    Attends religious service: Not on file    Active member of club or organization: Not on file    Attends meetings of clubs or organizations: Not on file    Relationship status: Not on file  . Intimate partner violence:    Fear of current or ex partner: Not on file    Emotionally abused: Not on file    Physically abused: Not on file    Forced sexual activity: Not on file  Other Topics Concern  . Not on file  Social History Narrative  . Not on file     ROS:  General: Negative for   fever, chills, fatigue, weakness.  Diminished oral intake associate with constipation over the last few weeks. Eyes: Negative for vision changes.  ENT: Negative for hoarseness, difficulty swallowing , nasal congestion. CV: Negative for chest pain, angina, palpitations, dyspnea on exertion, peripheral edema.  Respiratory: Negative for dyspnea at rest, dyspnea on exertion, cough, sputum, wheezing.  GI: See history of present illness. GU:  Negative for dysuria, hematuria, urinary incontinence, urinary frequency, nocturnal urination.  MS: Negative for joint pain.  Worsening low back pain over the past few weeks..  Derm: Negative for rash or itching.  Neuro: Negative for weakness, abnormal sensation, seizure, frequent headaches, memory loss, confusion.  Psych: Negative for anxiety, depression, suicidal ideation, hallucinations.  Endo: Negative for unusual weight change.  Heme: Negative for bruising or bleeding. Allergy: Negative for  rash or hives.       Physical Examination: Vital signs in last 24 hours: Temp:  [98.6 F (37 C)] 98.6 F (37 C) (03/27 0520) Pulse Rate:  [74-90]  74 (03/27 0520) Resp:  [15-18] 16 (03/26 1730) BP: (126-177)/(77-95) 136/86 (03/27 0520) SpO2:  [97 %-100 %] 99 % (03/27 0520) Weight:  [154 lb 5.2 oz (70 kg)] 154 lb 5.2 oz (70 kg) (03/27 0520) Last BM Date: 12/17/17  General: Well-nourished, well-developed in no acute distress.  Wife at bedside.  Able to ambulate to the bathroom twice during the encounter.  No lightheadedness or dizziness. Head: Normocephalic, atraumatic.   Eyes: Conjunctiva pink, no icterus. Mouth: Oropharyngeal mucosa moist and pink , no lesions erythema or exudate. Neck: Supple without thyromegaly, masses, or lymphadenopathy.  Lungs: Clear to auscultation bilaterally.  Heart: Regular rate and rhythm, no murmurs rubs or gallops.  Abdomen: Bowel sounds are normal, slightly distended, diffuse mild tenderness with palpation, no hepatosplenomegaly or masses, no abdominal bruits or    hernia , no rebound or guarding.   Rectal: Not performed.  Examined stool, liquid brown with red blood in the toilet, on the toilet tissue, on the bedding.  Small amount of blood noted.   Extremities: No lower extremity edema, clubbing, deformity.  Neuro: Alert and oriented x 4 , grossly normal neurologically.  Skin: Warm and dry, no rash or jaundice.   Psych: Alert and cooperative, normal mood and affect.        Intake/Output from previous day: 03/26 0701 - 03/27 0700 In: 1175 [P.O.:120; I.V.:555; IV Piggyback:500] Out: 550 [Urine:550] Intake/Output this shift: No intake/output data recorded.  Lab Results: CBC Recent Labs    12/16/17 1318 12/17/17 0609  WBC 14.4* 11.0*  HGB 14.7 13.2  HCT 41.7 38.8*  MCV 93.5 94.9  PLT PLATELET CLUMPS NOTED ON SMEAR, COUNT APPEARS ADEQUATE 455*   BMET Recent Labs    12/16/17 1738 12/16/17 2309 12/17/17 0609  NA 126* 123* 126*  K 3.2* 3.4* 2.8*  CL 89* 89* 89*  CO2 24 21* 21*  GLUCOSE 106* 104* 124*  BUN 11 12 12   CREATININE 0.85 0.89 0.80  CALCIUM 9.0 8.7* 8.8*   LFT Recent Labs     12/16/17 1318  BILITOT 0.9  ALKPHOS 109  AST 28  ALT 28  PROT 7.4  ALBUMIN 3.5    Lipase No results for input(s): LIPASE in the last 72 hours.  PT/INR No results for input(s): LABPROT, INR in the last 72 hours.    Imaging Studies: Ct Abdomen Pelvis W Contrast  Result Date: 12/16/2017 CLINICAL DATA:  Lower abdominal and back pain for 2 weeks EXAM: CT ABDOMEN AND PELVIS WITH CONTRAST TECHNIQUE: Multidetector CT imaging of the abdomen and pelvis was performed using the standard protocol following bolus administration of intravenous contrast. CONTRAST:  197mL ISOVUE-300 IOPAMIDOL (ISOVUE-300) INJECTION 61% COMPARISON:  None. FINDINGS: Lower chest: Lung bases demonstrate no acute consolidation. Trace right-sided pleural effusion. Normal heart size. Hepatobiliary: Probable focal fat infiltration near the falciform ligament. Dilated gallbladder with small stones. No biliary dilatation Pancreas: Unremarkable. No pancreatic ductal dilatation or surrounding inflammatory changes. Spleen: Calcified granuloma Adrenals/Urinary Tract: Adrenal glands are within normal limits. Kidneys show no hydronephrosis. Bladder unremarkable Stomach/Bowel: Stomach is nonenlarged. Fluid-filled small bowel without convincing evidence for obstruction. Transverse orientation of cecum. Moderate stool in the right colon. Normal appendix. Sigmoid colon diverticula without acute inflammation Vascular/Lymphatic: Moderate to severe aortic atherosclerosis. No aneurysmal dilatation. No significantly enlarged lymph nodes Reproductive:  Enlarged prostate gland with mass effect on the base of the bladder Other: Negative for free air or free fluid. Small fat in the umbilical region Musculoskeletal: Advanced degenerative changes of the lumbar spine with sclerosis and disc space narrowing. Moderate canal stenosis at L3-L4 and marked canal stenosis at L4-L5. IMPRESSION: 1. No CT evidence for acute intra-abdominal or pelvic abnormality.  Moderate stool in the right colon. 2. Sigmoid colon diverticular disease without acute inflammation 3. Gallstones 4. Prostatomegaly 5. Trace right pleural effusion Electronically Signed   By: Donavan Foil M.D.   On: 12/16/2017 15:22  [4 week]   Impression: 77 y/o male presenting with several week history of constipation, progressive low back pain and abdominal pain. Failed multiple laxatives ordered by his PCP. Did have couple of large stools with castor oil and Mag Citrate. Baseline typically has BM every other day.  Etiology for change in bowel habits unclear at this time.  Denies any recent pain medication.  He has had some limited NSAID use recently for back pain.  CT on admission with moderate stool noted in the right colon, fluid-filled small bowel without evidence of obstruction.  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  MRI pending.  Patient finally having some results after SMOG enema last night.  Now is having some small volume fresh blood per rectum.  Mild improvement in his hyponatremia, potassium down to 2.8 this morning.  Plan: 1. Correction of electrolyte abnormalities per attending. 2. Patient with multiple BMs this morning, hold off on bowel regimen for the rest of today.  Consider starting Linzess or Amitiza tomorrow after reevaluation.  3. We will not rule out need for repeat colonoscopy depending on clinical course.  Rectal bleeding may be due to benign anorectal source in the setting of constipation at this point.  We will continue to monitor closely.  We would like to thank you for the opportunity to participate in the care of Damon Parrish.  Laureen Ochs. Bernarda Caffey [facility] [phone] 3/27/20199:45 AM     LOS: 0 days   Addendum: MRI findings noted. Patient with epidural abscess. Likely pending transfer to [facility]. Will plan to see patient as outpatient to  follow up on rectal bleeding constipation.   Consider starting bowel regimen to avoid repeated constipation. Linzess 72 or 145 mcg daily.   Laureen Ochs. Bernarda Caffey [facility] [phone] 3/27/20194:15 PM

## 2017-12-17 NOTE — Progress Notes (Addendum)
Pharmacy Antibiotic Note  Damon Parrish is a 77 y.o. 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  Pharmacy has been consulted for Vancomycin dosing. Patient is also on ceftriaxone.  Plan: Vancomycin 1500 mg IV x 1 dose Vancomycin 750 mg IV every 12 hours.  Goal trough 15-20 mcg/mL.  Monitor labs, c/s, and vanco trough as needed  Height: 5' 8.5" (174 cm) Weight: 154 lb 5.2 oz (70 kg) IBW/kg (Calculated) : 69.55  Temp (24hrs), Avg:98.6 F (37 C), Min:98.6 F (37 C), Max:98.6 F (37 C)  Recent Labs  Lab 12/16/17 1318 12/16/17 1738 12/16/17 2309 12/17/17 0609  WBC 14.4*  --   --  11.0*  CREATININE 0.81 0.85 0.89 0.80    Estimated Creatinine Clearance: 76.1 mL/min (by C-G formula based on SCr of 0.8 mg/dL).    No Known Allergies  Antimicrobials this admission: Vancomycin 3/27 >>  Ceftriaxone 3/27 >>   Dose adjustments this admission: N/A  Microbiology results: 3/27 BCx: pending   Thank you for allowing pharmacy to be a part of this patient's care.  Margot Ables, PharmD Clinical Pharmacist 12/17/2017 1:08 PM

## 2017-12-17 NOTE — Progress Notes (Signed)
12/17/2017 11:47 AM  I spoke with radiologist about MRI results.  I called neurosurgery-on call for Damon Parrish. Outpatient Center and awaiting for call back from Dr.Musich or his PA per office.   Blood cultures have been ordered.    Murvin Natal MD

## 2017-12-17 NOTE — Telephone Encounter (Signed)
Please arrange for hospital follow up in 6-8 weeks for constipation/rectal bleeding.

## 2017-12-17 NOTE — Progress Notes (Addendum)
12/17/2017 6:11 PM  I received a call from Neurosurgeon Dr. Ronnald Ramp.  He says that there are no beds available at [facility] and I should attempt to send patient to another institution because he needs to have access to neurosurgery if he develops acute loss of sensation, strength, worsening neuro changes in the lower extremities.  For now continue IV antibiotics and continue neuro checks and monitoring.   I will attempt to contact [facility] and if can't accept, will try other institutions.   Update: I called [facility] and spoke with spine surgeon and he agreed to accept patient. He asked for a disc with the film images which I requested the secretary on 300 to obtain and send with patient to [facility].  I spoke with hospitalist Dr. Albertine Grates regarding medical history and patient was accepted to transfer.     I updated patient's wife and she verbalized understanding.   Murvin Natal MD

## 2017-12-17 NOTE — Progress Notes (Addendum)
PROGRESS NOTE    Damon Parrish  GXQ:[id number]  DOB: Sep 10, 1941  DOA: 12/16/2017 PCP: Tobe Sos, MD  Brief Admission Hx: Damon Parrish is a 77 y.o. male with medical history significant for hypertension, dyslipidemia, CVA, and prior seizures who presents to the emergency department for worsening generalized fatigue and weakness over about 1 month along with abdominal pain and distention with chronic constipation for the last 2-4 weeks.  He also complained of worsening low back pain.  He is having to lay flat in bed due to pain.  He denies fever and chills.  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    MDM/Assessment & Plan:   1. 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  Pt and wife updated at bedside.   I spoke with Dr. Ronnald Ramp who requested patient transfer to [place] and he will consult.   2. Hyponatremia - slowly improving with IV hydration and holding HCTZ.   3. Chronic constipation - Pt has been given enemas and constipation has been relieved.  4. Hypertension - Holding HCTZ, continue amlodipine.  5. Cerebrovascular disease - Holding Statin medication.  He is on aspirin for secondary prevention.    6. Abdominal pain - improved after laxative therapy.   DVT prophylaxis: lovenox Code Status: DNR Family Communication: wife at bedside Disposition Plan: transfer to higher level care   Consultants:  Neurosurgery  GI  Subjective: Pt reports that his back pain remains severe.  He has no loss of bowel or bladder function.  The enemas have relieved his constipation and abdominal pain.    Objective: Vitals:   12/16/17 2100 12/16/17 2203  12/16/17 2226 12/17/17 0520  BP: (!) 158/86 (!) 177/90  136/86  Pulse: 85 87  74  Resp:      Temp:  98.6 F (37 C)  98.6 F (37 C)  TempSrc:  Oral  Oral  SpO2: 98% 98% 98% 99%  Weight:  70 kg (154 lb 5.2 oz)  70 kg (154 lb 5.2 oz)  Height:        Intake/Output Summary (Last 24 hours) at 12/17/2017 1238 Last data filed at 12/17/2017 1448 Gross per 24 hour  Intake 1175 ml  Output 550 ml  Net 625 ml   Filed Weights   12/16/17 0927 12/16/17 2203 12/17/17 0520  Weight: 74.4 kg (164 lb) 70 kg (154 lb 5.2 oz) 70 kg (154 lb 5.2 oz)     REVIEW OF SYSTEMS  As per history otherwise all reviewed and reported negative  Exam:  General exam: chronically ill appearing male, awake, alert, NAD.  Respiratory system:  No increased work of breathing. Cardiovascular system: S1 & S2 heard.  No JVD, murmurs, gallops, clicks or pedal edema. Gastrointestinal system: Abdomen is nondistended, soft and nontender. Normal bowel sounds heard. Central nervous system: Alert and oriented. No focal neurological deficits.  Extremities: no CCE.  Data Reviewed: Basic Metabolic Panel: Recent Labs  Lab 12/16/17 1318 12/16/17 1738 12/16/17 1739 12/16/17 2309 12/17/17 0609  NA 122* 126*  --  123* 126*  K 3.4* 3.2*  --  3.4* 2.8*  CL 85* 89*  --  89* 89*  CO2 19* 24  --  21* 21*  GLUCOSE 103* 106*  --  104* 124*  BUN 12 11  --  12 12  CREATININE 0.81 0.85  --  0.89 0.80  CALCIUM 9.1 9.0  --  8.7* 8.8*  MG  --   --  1.8  --  1.8   Liver Function Tests: Recent Labs  Lab 12/16/17 1318  AST 28  ALT 28  ALKPHOS 109  BILITOT 0.9  PROT 7.4  ALBUMIN 3.5   No results for input(s): LIPASE, AMYLASE in the last 168 hours. No results for input(s): AMMONIA in the last 168 hours. CBC: Recent Labs  Lab 12/16/17 1318 12/17/17 0609  WBC 14.4* 11.0*  NEUTROABS 11.7  --   HGB 14.7 13.2  HCT 41.7 38.8*  MCV 93.5 94.9  PLT PLATELET CLUMPS NOTED ON SMEAR, COUNT APPEARS ADEQUATE 455*   Cardiac  Enzymes: No results for input(s): CKTOTAL, CKMB, CKMBINDEX, TROPONINI in the last 168 hours. CBG (last 3)  Recent Labs    12/16/17 2156 12/17/17 0719  GLUCAP 106* 118*   No results found for this or any previous visit (from the past 240 hour(s)).   Studies: Mr Lumbar Spine Wo Contrast  Addendum Date: 12/17/2017   ADDENDUM REPORT: 12/17/2017 11:24 ADDENDUM: These results were called by telephone at the time of interpretation on 12/17/2017 at 11:23 am to Dr. Irwin Brakeman , who verbally acknowledged these results. Electronically Signed   By: Staci Righter M.D.   On: 12/17/2017 11:24   Result Date: 12/17/2017 CLINICAL DATA:  Several week history of back pain.  Weakness. EXAM: MRI THORACIC SPINE WITHOUT AND WITH CONTRAST MR LUMBAR SPINE WITHOUT CONTRAST TECHNIQUE: Multiplanar and multiecho pulse sequences of the thoracic spine were obtained without and with intravenous contrast. Multiplanar multi-echo pulse sequences of the lumbar spine were obtained without contrast. CONTRAST:  55mL MULTIHANCE GADOBENATE DIMEGLUMINE 529 MG/ML IV SOLN COMPARISON:  CT abdomen pelvis 12/16/2017 does not include the area of concern in the thoracic region. FINDINGS: MRI THORACIC SPINE FINDINGS Alignment:  Anatomic. Vertebrae: T9 and T10 osteomyelitis secondary to T9-10 diskitis. There is mild loss of vertebral body height at both levels without significant retropulsion. Cord: Moderate to severe cord compression due to epidural abscess at T9-10, greater on the RIGHT. Paraspinal and other soft tissues: Paravertebral soft tissue swelling at T9 and T10, greater on the RIGHT. Disc levels: Minor thoracic disc pathology with the exception of T9-10, where there is significant diskitis. MRI LUMBAR SPINE FINDINGS Segmentation:  Standard. Alignment: Straightening of the normal lumbar lordosis, but no significant subluxation. Vertebrae:  No worrisome osseous lesion.  Endplate reactive changes. Conus medullaris: Extends to the L1 level  and appears normal. Paraspinal and other soft tissues: Unremarkable. Disc levels: L1-L2: Disc space narrowing. Annular bulge. Foraminal protrusion to the LEFT. LEFT L1 and LEFT L2 nerve root impingement are possible. L2-L3: Disc space narrowing. Central and leftward protrusion. Posterior element hypertrophy. Mild stenosis. LEFT L3 nerve root impingement possible. L3-L4: Near complete loss of interspace height. Central protrusion with osseous spurring. Posterior element hypertrophy. Moderate stenosis. BILATERAL L3 and L4 nerve root impingement are possible. L4-L5: Near complete loss of interspace height. Central and leftward extrusion, caudally migrated fragment, posterior element hypertrophy. Severe stenosis. LEFT greater than RIGHT L5 nerve root impingement. BILATERAL foraminal narrowing due to posterior element hypertrophy affects the RIGHT greater than LEFT L4 nerve roots. L5-S1: Advanced disc space narrowing. Central protrusion. Posterior element hypertrophy. Disc material extends into both foramina. LEFT greater than RIGHT L5 and S1 nerve root impingement.  IMPRESSION: THORACIC SPINE: T9-10 diskitis and osteomyelitis. Epidural abscess with significant cord compression. Surgical consultation is warranted. 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 LEFT greater than RIGHT L5 nerve root impingement. A call has been placed to the ordering provider. Electronically Signed: By: Staci Righter M.D. On: 12/17/2017 11:12   Mr Thoracic Spine W Wo Contrast  Addendum Date: 12/17/2017   ADDENDUM REPORT: 12/17/2017 11:24 ADDENDUM: These results were called by telephone at the time of interpretation on 12/17/2017 at 11:23 am to Dr. Irwin Brakeman , who verbally acknowledged these results. Electronically Signed   By: Staci Righter M.D.   On: 12/17/2017 11:24   Result  Date: 12/17/2017 CLINICAL DATA:  Several week history of back pain.  Weakness. EXAM: MRI THORACIC SPINE WITHOUT AND WITH CONTRAST MR LUMBAR SPINE WITHOUT CONTRAST TECHNIQUE: Multiplanar and multiecho pulse sequences of the thoracic spine were obtained without and with intravenous contrast. Multiplanar multi-echo pulse sequences of the lumbar spine were obtained without contrast. CONTRAST:  80mL MULTIHANCE GADOBENATE DIMEGLUMINE 529 MG/ML IV SOLN COMPARISON:  CT abdomen pelvis 12/16/2017 does not include the area of concern in the thoracic region. FINDINGS: MRI THORACIC SPINE FINDINGS Alignment:  Anatomic. Vertebrae: T9 and T10 osteomyelitis secondary to T9-10 diskitis. There is mild loss of vertebral body height at both levels without significant retropulsion. Cord: Moderate to severe cord compression due to epidural abscess at T9-10, greater on the RIGHT. Paraspinal and other soft tissues: Paravertebral soft tissue swelling at T9 and T10, greater on the RIGHT. Disc levels: Minor thoracic disc pathology with the exception of T9-10, where there is significant diskitis. MRI LUMBAR SPINE FINDINGS Segmentation:  Standard. Alignment: Straightening of the normal lumbar lordosis, but no significant subluxation. Vertebrae:  No worrisome osseous lesion.  Endplate reactive changes. Conus medullaris: Extends to the L1 level and appears normal. Paraspinal and other soft tissues: Unremarkable. Disc levels: L1-L2: Disc space narrowing. Annular bulge. Foraminal protrusion to the LEFT. LEFT L1 and LEFT L2 nerve root impingement are possible. L2-L3: Disc space narrowing. Central and leftward protrusion. Posterior element hypertrophy. Mild stenosis. LEFT L3 nerve root impingement possible. L3-L4: Near complete loss of interspace height. Central protrusion with osseous spurring. Posterior element hypertrophy. Moderate stenosis. BILATERAL L3 and L4 nerve root impingement are possible. L4-L5: Near complete loss of interspace height.  Central and leftward extrusion, caudally migrated fragment, posterior element hypertrophy. Severe stenosis. LEFT greater than RIGHT L5 nerve root impingement. BILATERAL foraminal narrowing due to posterior element hypertrophy affects the RIGHT greater than LEFT L4 nerve roots. L5-S1: Advanced disc space narrowing. Central protrusion. Posterior element hypertrophy. Disc material extends into both foramina. LEFT greater than RIGHT L5 and S1 nerve root impingement. IMPRESSION: THORACIC SPINE: T9-10 diskitis and osteomyelitis. Epidural abscess with significant cord compression. Surgical consultation is warranted. 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 LEFT greater than RIGHT L5 nerve root impingement. A call has been placed to the ordering provider. Electronically Signed: By: Staci Righter M.D. On: 12/17/2017 11:12   Ct Abdomen Pelvis W Contrast  Result Date: 12/16/2017 CLINICAL DATA:  Lower abdominal and back pain for 2 weeks EXAM: CT ABDOMEN AND PELVIS WITH CONTRAST TECHNIQUE: Multidetector CT imaging of the abdomen and pelvis was performed using the standard protocol following bolus administration of intravenous contrast. CONTRAST:  133mL ISOVUE-300 IOPAMIDOL (ISOVUE-300) INJECTION 61% COMPARISON:  None. FINDINGS: Lower chest: Lung bases demonstrate no acute consolidation. Trace right-sided pleural effusion. Normal heart size. Hepatobiliary: Probable focal fat infiltration near the falciform ligament. Dilated gallbladder with small stones. No biliary dilatation Pancreas: Unremarkable. No pancreatic ductal dilatation or surrounding inflammatory changes. Spleen: Calcified granuloma Adrenals/Urinary Tract: Adrenal glands are within normal limits. Kidneys show no hydronephrosis. Bladder unremarkable Stomach/Bowel: Stomach is nonenlarged. Fluid-filled small  bowel without convincing evidence for obstruction. Transverse orientation of cecum. Moderate stool in the right colon. Normal appendix. Sigmoid colon diverticula without acute inflammation Vascular/Lymphatic: Moderate to severe aortic atherosclerosis. No aneurysmal dilatation. No significantly enlarged lymph nodes Reproductive: Enlarged prostate gland with mass effect on the base of the bladder Other: Negative for free air or free fluid. Small fat in the umbilical region Musculoskeletal: Advanced degenerative changes of the lumbar spine with sclerosis and disc space narrowing. Moderate canal stenosis at L3-L4 and marked canal stenosis at L4-L5. IMPRESSION: 1. No CT evidence for acute intra-abdominal or pelvic abnormality. Moderate stool in the right colon. 2. Sigmoid colon diverticular disease without acute inflammation 3. Gallstones 4. Prostatomegaly 5. Trace right pleural effusion Electronically Signed   By: Donavan Foil M.D.   On: 12/16/2017 15:22   Scheduled Meds: . amLODipine  5 mg Oral Daily  . aspirin  325 mg Oral Daily  . cholecalciferol  1,000 Units Oral Daily  . enoxaparin (LOVENOX) injection  40 mg Subcutaneous Q24H  . fluticasone  1 spray Each Nare Daily  . latanoprost  1 drop Both Eyes BID  . multivitamin-lutein  1 capsule Oral Daily  . potassium chloride  40 mEq Oral Daily  . vitamin C  1,000 mg Oral Daily  . vitamin E  400 Units Oral Daily   Continuous Infusions: . cefTRIAXone (ROCEPHIN)  IV     Principal Problem:   Hyponatremia Active Problems:   Focal seizure (HCC)   Essential hypertension   Hyperlipidemia   Cerebrovascular accident (CVA) due to embolism of right middle cerebral artery ([place])   Constipation   Rectal bleeding  Time spent:   Irwin Brakeman, MD, FAAFP [facility] Pager [phone]  If 7PM-7AM, please contact night-coverage [email or web address] Password [facility] 12/17/2017, 12:38 PM    LOS: 0 days

## 2017-12-17 NOTE — Telephone Encounter (Signed)
  REVIEWED. CONSTIPATION AND URINARY RETENTION LIKELY RELATED TO SPINAL CORD COMPRESSION. PT TRANSFERRED TO [facility]. OPV TO DISCUSS NEED FOR RECTAL BLEEDING THAT BEGAN WHILE HOSPITALIZED FOR CONSTIPATION AND RECEIVING ENEMAS. OBTAIN TCS REPORT FROM [place]. NO NEED TO PERFORM INTERVAL TCS AT THIS TIME. WILL REASSESS AS OUTPT IN 6-8 WEEKS E30 CONSTIPATION/ [facility] W/ [facility].Marland Kitchen

## 2017-12-18 ENCOUNTER — Encounter: Payer: Self-pay | Admitting: Gastroenterology

## 2017-12-18 MED ORDER — SODIUM CHLORIDE 0.9 % IV SOLN
INTRAVENOUS | Status: DC
Start: ? — End: 2017-12-18

## 2017-12-18 MED ORDER — GENERIC EXTERNAL MEDICATION
1.00 g | Status: DC
Start: 2017-12-19 — End: 2017-12-18

## 2017-12-18 MED ORDER — DEXAMETHASONE 4 MG PO TABS
4.00 | ORAL_TABLET | ORAL | Status: DC
Start: 2017-12-20 — End: 2017-12-18

## 2017-12-18 MED ORDER — ACETAMINOPHEN 325 MG PO TABS
650.00 | ORAL_TABLET | ORAL | Status: DC
Start: ? — End: 2017-12-18

## 2017-12-18 MED ORDER — MAGNESIUM OXIDE 400 MG PO TABS
400.00 | ORAL_TABLET | ORAL | Status: DC
Start: 2017-12-18 — End: 2017-12-18

## 2017-12-18 MED ORDER — DOCUSATE SODIUM 100 MG PO CAPS
100.00 | ORAL_CAPSULE | ORAL | Status: DC
Start: ? — End: 2017-12-18

## 2017-12-18 MED ORDER — OXYCODONE HCL 5 MG PO TABS
5.00 | ORAL_TABLET | ORAL | Status: DC
Start: ? — End: 2017-12-18

## 2017-12-18 MED ORDER — POLYETHYLENE GLYCOL 3350 17 G PO PACK
17.00 g | PACK | ORAL | Status: DC
Start: 2017-12-21 — End: 2017-12-18

## 2017-12-18 MED ORDER — PANTOPRAZOLE SODIUM 40 MG PO TBEC
40.00 | DELAYED_RELEASE_TABLET | ORAL | Status: DC
Start: 2017-12-20 — End: 2017-12-18

## 2017-12-18 MED ORDER — AMLODIPINE BESYLATE 5 MG PO TABS
5.00 | ORAL_TABLET | ORAL | Status: DC
Start: 2017-12-19 — End: 2017-12-18

## 2017-12-18 MED ORDER — VANCOMYCIN HCL IN DEXTROSE 1-5 GM/200ML-% IV SOLN
15.00 | INTRAVENOUS | Status: DC
Start: 2017-12-20 — End: 2017-12-18

## 2017-12-18 MED ORDER — ATORVASTATIN CALCIUM 40 MG PO TABS
40.00 | ORAL_TABLET | ORAL | Status: DC
Start: 2017-12-18 — End: 2017-12-18

## 2017-12-18 NOTE — Telephone Encounter (Signed)
PATIENT SCHEDULED  °

## 2017-12-18 NOTE — Progress Notes (Signed)
Pt transported via EMS to [facility] per order. VS stable and within baseline.

## 2017-12-19 MED ORDER — NALOXONE HCL 0.4 MG/ML IJ SOLN
0.20 | INTRAMUSCULAR | Status: DC
Start: ? — End: 2017-12-19

## 2017-12-19 MED ORDER — BRINZOLAMIDE 1 % OP SUSP
1.00 | OPHTHALMIC | Status: DC
Start: 2017-12-20 — End: 2017-12-19

## 2017-12-19 MED ORDER — FENTANYL CITRATE (PF) 2500 MCG/50ML IJ SOLN
25.00 | INTRAMUSCULAR | Status: DC
Start: ? — End: 2017-12-19

## 2017-12-19 MED ORDER — LACTATED RINGERS IV SOLN
INTRAVENOUS | Status: DC
Start: ? — End: 2017-12-19

## 2017-12-19 MED ORDER — MIDAZOLAM HCL 5 MG/5ML IJ SOLN
0.50 | INTRAMUSCULAR | Status: DC
Start: ? — End: 2017-12-19

## 2017-12-19 MED ORDER — ASPIRIN BUF(CACARB-MGCARB-MGO) 325 MG PO TABS
325.00 | ORAL_TABLET | ORAL | Status: DC
Start: 2017-12-21 — End: 2017-12-19

## 2017-12-19 MED ORDER — GENERIC EXTERNAL MEDICATION
2.00 g | Status: DC
Start: 2017-12-20 — End: 2017-12-19

## 2017-12-19 MED ORDER — GENERIC EXTERNAL MEDICATION
0.20 | Status: DC
Start: ? — End: 2017-12-19

## 2017-12-19 MED ORDER — AMLODIPINE BESYLATE 5 MG PO TABS
10.00 | ORAL_TABLET | ORAL | Status: DC
Start: 2017-12-21 — End: 2017-12-19

## 2017-12-19 MED ORDER — ATORVASTATIN CALCIUM 40 MG PO TABS
40.00 | ORAL_TABLET | ORAL | Status: DC
Start: 2017-12-20 — End: 2017-12-19

## 2017-12-19 MED ORDER — BRIMONIDINE TARTRATE 0.15 % OP SOLN
1.00 | OPHTHALMIC | Status: DC
Start: 2017-12-20 — End: 2017-12-19

## 2017-12-19 MED ORDER — INSULIN LISPRO 100 UNIT/ML ~~LOC~~ SOLN
1.00 | SUBCUTANEOUS | Status: DC
Start: 2017-12-20 — End: 2017-12-19

## 2017-12-19 MED ORDER — DEXTROSE 10 % IV SOLN
125.00 | INTRAVENOUS | Status: DC
Start: ? — End: 2017-12-19

## 2017-12-19 MED ORDER — GENERIC EXTERNAL MEDICATION
1.00 | Status: DC
Start: 2017-12-20 — End: 2017-12-19

## 2017-12-19 MED ORDER — FLUTICASONE PROPIONATE 50 MCG/ACT NA SUSP
1.00 | NASAL | Status: DC
Start: 2017-12-21 — End: 2017-12-19

## 2017-12-20 MED ORDER — GENERIC EXTERNAL MEDICATION
20.00 | Status: DC
Start: 2017-12-21 — End: 2017-12-20

## 2017-12-20 MED ORDER — SODIUM CHLORIDE 0.9 % IV SOLN
INTRAVENOUS | Status: DC
Start: 2017-12-20 — End: 2017-12-20

## 2017-12-20 MED ORDER — ENOXAPARIN SODIUM 40 MG/0.4ML ~~LOC~~ SOLN
40.00 | SUBCUTANEOUS | Status: DC
Start: 2017-12-21 — End: 2017-12-20

## 2017-12-22 LAB — CULTURE, BLOOD (ROUTINE X 2)
Culture: NO GROWTH
Culture: NO GROWTH
SPECIAL REQUESTS: ADEQUATE
Special Requests: ADEQUATE

## 2018-01-08 ENCOUNTER — Encounter (HOSPITAL_COMMUNITY): Payer: Self-pay

## 2018-01-08 ENCOUNTER — Inpatient Hospital Stay (HOSPITAL_COMMUNITY)
Admission: EM | Admit: 2018-01-08 | Discharge: 2018-01-17 | DRG: 606 | Disposition: A | Discharge status: Home / Self Care | Payer: Medicare Other | Attending: Internal Medicine | Admitting: Internal Medicine

## 2018-01-08 ENCOUNTER — Emergency Department (HOSPITAL_COMMUNITY): Payer: Medicare Other

## 2018-01-08 ENCOUNTER — Other Ambulatory Visit: Payer: Self-pay

## 2018-01-08 DIAGNOSIS — D7212 Drug rash with eosinophilia and systemic symptoms syndrome: Secondary | ICD-10-CM

## 2018-01-08 DIAGNOSIS — L270 Generalized skin eruption due to drugs and medicaments taken internally: Secondary | ICD-10-CM | POA: Diagnosis not present

## 2018-01-08 DIAGNOSIS — D721 Eosinophilia: Secondary | ICD-10-CM | POA: Diagnosis present

## 2018-01-08 DIAGNOSIS — K828 Other specified diseases of gallbladder: Secondary | ICD-10-CM | POA: Diagnosis present

## 2018-01-08 DIAGNOSIS — I10 Essential (primary) hypertension: Secondary | ICD-10-CM | POA: Diagnosis present

## 2018-01-08 DIAGNOSIS — Z807 Family history of other malignant neoplasms of lymphoid, hematopoietic and related tissues: Secondary | ICD-10-CM

## 2018-01-08 DIAGNOSIS — T50905A Adverse effect of unspecified drugs, medicaments and biological substances, initial encounter: Secondary | ICD-10-CM | POA: Diagnosis not present

## 2018-01-08 DIAGNOSIS — T7840XA Allergy, unspecified, initial encounter: Secondary | ICD-10-CM

## 2018-01-08 DIAGNOSIS — T361X5A Adverse effect of cephalosporins and other beta-lactam antibiotics, initial encounter: Secondary | ICD-10-CM | POA: Diagnosis present

## 2018-01-08 DIAGNOSIS — K5900 Constipation, unspecified: Secondary | ICD-10-CM | POA: Diagnosis present

## 2018-01-08 DIAGNOSIS — M4644 Discitis, unspecified, thoracic region: Secondary | ICD-10-CM | POA: Diagnosis present

## 2018-01-08 DIAGNOSIS — M4624 Osteomyelitis of vertebra, thoracic region: Secondary | ICD-10-CM | POA: Diagnosis not present

## 2018-01-08 DIAGNOSIS — Z818 Family history of other mental and behavioral disorders: Secondary | ICD-10-CM

## 2018-01-08 DIAGNOSIS — E7800 Pure hypercholesterolemia, unspecified: Secondary | ICD-10-CM | POA: Diagnosis present

## 2018-01-08 DIAGNOSIS — K219 Gastro-esophageal reflux disease without esophagitis: Secondary | ICD-10-CM | POA: Diagnosis present

## 2018-01-08 DIAGNOSIS — G062 Extradural and subdural abscess, unspecified: Secondary | ICD-10-CM | POA: Diagnosis not present

## 2018-01-08 DIAGNOSIS — G061 Intraspinal abscess and granuloma: Secondary | ICD-10-CM | POA: Diagnosis present

## 2018-01-08 DIAGNOSIS — Z8249 Family history of ischemic heart disease and other diseases of the circulatory system: Secondary | ICD-10-CM

## 2018-01-08 DIAGNOSIS — E871 Hypo-osmolality and hyponatremia: Secondary | ICD-10-CM | POA: Diagnosis present

## 2018-01-08 DIAGNOSIS — L8990 Pressure ulcer of unspecified site, unspecified stage: Secondary | ICD-10-CM

## 2018-01-08 DIAGNOSIS — M1990 Unspecified osteoarthritis, unspecified site: Secondary | ICD-10-CM | POA: Diagnosis present

## 2018-01-08 DIAGNOSIS — Z8673 Personal history of transient ischemic attack (TIA), and cerebral infarction without residual deficits: Secondary | ICD-10-CM

## 2018-01-08 DIAGNOSIS — Z87442 Personal history of urinary calculi: Secondary | ICD-10-CM

## 2018-01-08 DIAGNOSIS — Z66 Do not resuscitate: Secondary | ICD-10-CM | POA: Diagnosis present

## 2018-01-08 DIAGNOSIS — G8929 Other chronic pain: Secondary | ICD-10-CM | POA: Diagnosis present

## 2018-01-08 DIAGNOSIS — Z9842 Cataract extraction status, left eye: Secondary | ICD-10-CM

## 2018-01-08 DIAGNOSIS — Z79899 Other long term (current) drug therapy: Secondary | ICD-10-CM

## 2018-01-08 DIAGNOSIS — T368X5A Adverse effect of other systemic antibiotics, initial encounter: Secondary | ICD-10-CM | POA: Diagnosis present

## 2018-01-08 DIAGNOSIS — Z9841 Cataract extraction status, right eye: Secondary | ICD-10-CM

## 2018-01-08 DIAGNOSIS — M4640 Discitis, unspecified, site unspecified: Secondary | ICD-10-CM | POA: Diagnosis present

## 2018-01-08 DIAGNOSIS — E860 Dehydration: Secondary | ICD-10-CM | POA: Diagnosis present

## 2018-01-08 DIAGNOSIS — Z7982 Long term (current) use of aspirin: Secondary | ICD-10-CM

## 2018-01-08 DIAGNOSIS — R509 Fever, unspecified: Secondary | ICD-10-CM

## 2018-01-08 DIAGNOSIS — Z452 Encounter for adjustment and management of vascular access device: Secondary | ICD-10-CM

## 2018-01-08 DIAGNOSIS — Z961 Presence of intraocular lens: Secondary | ICD-10-CM | POA: Diagnosis present

## 2018-01-08 DIAGNOSIS — F17210 Nicotine dependence, cigarettes, uncomplicated: Secondary | ICD-10-CM | POA: Diagnosis present

## 2018-01-08 HISTORY — DX: Personal history of other medical treatment: Z92.89

## 2018-01-08 HISTORY — DX: Low back pain: M54.5

## 2018-01-08 HISTORY — DX: Low back pain, unspecified: M54.50

## 2018-01-08 HISTORY — DX: Extradural and subdural abscess, unspecified: G06.2

## 2018-01-08 HISTORY — DX: Pneumonia, unspecified organism: J18.9

## 2018-01-08 HISTORY — DX: Other chronic pain: G89.29

## 2018-01-08 LAB — URINALYSIS, ROUTINE W REFLEX MICROSCOPIC
Bilirubin Urine: NEGATIVE
Glucose, UA: NEGATIVE mg/dL
HGB URINE DIPSTICK: NEGATIVE
Ketones, ur: 5 mg/dL — AB
Leukocytes, UA: NEGATIVE
NITRITE: NEGATIVE
Protein, ur: 100 mg/dL — AB
SPECIFIC GRAVITY, URINE: 1.027 (ref 1.005–1.030)
Squamous Epithelial / LPF: NONE SEEN
pH: 6 (ref 5.0–8.0)

## 2018-01-08 LAB — CBC WITH DIFFERENTIAL/PLATELET
Basophils Absolute: 0 10*3/uL (ref 0.0–0.1)
Basophils Relative: 0 %
EOS ABS: 0.6 10*3/uL (ref 0.0–0.7)
EOS PCT: 10 %
HCT: 39.6 % (ref 39.0–52.0)
Hemoglobin: 13.4 g/dL (ref 13.0–17.0)
LYMPHS ABS: 0.6 10*3/uL — AB (ref 0.7–4.0)
Lymphocytes Relative: 10 %
MCH: 31.9 pg (ref 26.0–34.0)
MCHC: 33.8 g/dL (ref 30.0–36.0)
MCV: 94.3 fL (ref 78.0–100.0)
MONOS PCT: 5 %
Monocytes Absolute: 0.3 10*3/uL (ref 0.1–1.0)
Neutro Abs: 4.8 10*3/uL (ref 1.7–7.7)
Neutrophils Relative %: 75 %
PLATELETS: 215 10*3/uL (ref 150–400)
RBC: 4.2 MIL/uL — ABNORMAL LOW (ref 4.22–5.81)
RDW: 13.9 % (ref 11.5–15.5)
WBC: 6.4 10*3/uL (ref 4.0–10.5)

## 2018-01-08 LAB — COMPREHENSIVE METABOLIC PANEL
ALT: 247 U/L — ABNORMAL HIGH (ref 17–63)
ANION GAP: 13 (ref 5–15)
AST: 202 U/L — ABNORMAL HIGH (ref 15–41)
Albumin: 3.2 g/dL — ABNORMAL LOW (ref 3.5–5.0)
Alkaline Phosphatase: 135 U/L — ABNORMAL HIGH (ref 38–126)
BUN: 19 mg/dL (ref 6–20)
CALCIUM: 8.8 mg/dL — AB (ref 8.9–10.3)
CHLORIDE: 91 mmol/L — AB (ref 101–111)
CO2: 23 mmol/L (ref 22–32)
Creatinine, Ser: 0.97 mg/dL (ref 0.61–1.24)
GFR calc Af Amer: >60 mL/min (ref >60)
GFR calc non Af Amer: >60 mL/min (ref >60)
Glucose, Bld: 127 mg/dL — ABNORMAL HIGH (ref 65–99)
POTASSIUM: 4.1 mmol/L (ref 3.5–5.1)
SODIUM: 127 mmol/L — AB (ref 135–145)
Total Bilirubin: 0.6 mg/dL (ref 0.3–1.2)
Total Protein: 6.5 g/dL (ref 6.5–8.1)

## 2018-01-08 LAB — SEDIMENTATION RATE: Sed Rate: 16 mm/hr (ref 0–16)

## 2018-01-08 LAB — I-STAT CG4 LACTIC ACID, ED: Lactic Acid, Venous: 1.64 mmol/L (ref 0.5–1.9)

## 2018-01-08 LAB — C-REACTIVE PROTEIN: CRP: 5.5 mg/dL — ABNORMAL HIGH (ref <1.0)

## 2018-01-08 MED ORDER — LORATADINE 10 MG PO TABS
10.0000 mg | ORAL_TABLET | Freq: Once | ORAL | Status: AC
  Administered 2018-01-08: 10 mg via ORAL
  Filled 2018-01-08: qty 1

## 2018-01-08 MED ORDER — HYDROCODONE-ACETAMINOPHEN 5-325 MG PO TABS: 1.0000 | ORAL_TABLET | Freq: Four times a day (QID) | ORAL | Status: DC | PRN

## 2018-01-08 MED ORDER — DIPHENHYDRAMINE HCL 25 MG PO CAPS
25.0000 mg | ORAL_CAPSULE | Freq: Once | ORAL | Status: AC
  Administered 2018-01-08: 25 mg via ORAL
  Filled 2018-01-08: qty 1

## 2018-01-08 MED ORDER — ONDANSETRON 4 MG PO TBDP
ORAL_TABLET | ORAL | Status: AC
  Filled 2018-01-08: qty 1

## 2018-01-08 MED ORDER — ONDANSETRON 4 MG PO TBDP
4.0000 mg | ORAL_TABLET | Freq: Once | ORAL | Status: AC
  Administered 2018-01-08: 4 mg via ORAL

## 2018-01-08 MED ORDER — TIZANIDINE HCL 2 MG PO TABS: 2.0000 mg | ORAL_TABLET | ORAL | Status: DC | PRN

## 2018-01-08 MED ORDER — FLUTICASONE PROPIONATE 50 MCG/ACT NA SUSP
1.0000 | Freq: Every day | NASAL | Status: DC
  Administered 2018-01-09 – 2018-01-17 (×8): 1 via NASAL
  Filled 2018-01-08 (×3): qty 16

## 2018-01-08 MED ORDER — AMLODIPINE BESYLATE 10 MG PO TABS
10.0000 mg | ORAL_TABLET | Freq: Every day | ORAL | Status: DC
  Administered 2018-01-08 – 2018-01-17 (×10): 10 mg via ORAL
  Filled 2018-01-08: qty 2
  Filled 2018-01-08 (×4): qty 1
  Filled 2018-01-08: qty 2
  Filled 2018-01-08 (×4): qty 1

## 2018-01-08 MED ORDER — ONDANSETRON 4 MG PO TBDP
4.0000 mg | ORAL_TABLET | ORAL | Status: DC | PRN
  Filled 2018-01-08: qty 1

## 2018-01-08 MED ORDER — ASPIRIN 325 MG PO TABS
325.0000 mg | ORAL_TABLET | Freq: Every day | ORAL | Status: DC
  Administered 2018-01-08 – 2018-01-17 (×10): 325 mg via ORAL
  Filled 2018-01-08 (×10): qty 1

## 2018-01-08 MED ORDER — SODIUM CHLORIDE 0.9 % IV BOLUS
1000.0000 mL | Freq: Once | INTRAVENOUS | Status: AC
  Administered 2018-01-08: 1000 mL via INTRAVENOUS

## 2018-01-08 MED ORDER — SODIUM CHLORIDE 0.9 % IV BOLUS
500.0000 mL | Freq: Once | INTRAVENOUS | Status: AC
  Administered 2018-01-08: 500 mL via INTRAVENOUS

## 2018-01-08 MED ORDER — OXYCODONE HCL 5 MG PO TABS
5.0000 mg | ORAL_TABLET | ORAL | Status: DC | PRN
  Administered 2018-01-09 – 2018-01-16 (×11): 5 mg via ORAL
  Filled 2018-01-08 (×12): qty 1

## 2018-01-08 MED ORDER — ATORVASTATIN CALCIUM 40 MG PO TABS: 40.0000 mg | ORAL_TABLET | ORAL | Status: DC

## 2018-01-08 MED ORDER — METHYLPREDNISOLONE SODIUM SUCC 125 MG IJ SOLR: 60.0000 mg | Freq: Once | INTRAMUSCULAR | Status: DC

## 2018-01-08 NOTE — ED Notes (Signed)
Spoke with Angelic at [facility] and states they are awaiting discharges but pt should have a bed today. Pt and family updated.

## 2018-01-08 NOTE — ED Provider Notes (Signed)
[facility] EMERGENCY DEPARTMENT Provider Note   CSN: [id number] Arrival date & time: 01/08/18  0406     History   Chief Complaint Chief Complaint  Patient presents with  . Allergic Reaction    HPI Damon Parrish is a 77 y.o. male.  Patient presents to the emergency department for evaluation of allergic reaction.  Patient has been on IV antibiotics at home for 2 weeks.  He was hospitalized at [facility] for an epidural abscess in his low back.  Wife reports that she administered his nighttime antibiotics and then she noticed that he had broken out in raised red rash all over.  He denies any tongue swelling, throat swelling, difficulty breathing.  He is not feeling any particular pruritus.  Wife gave him a dose of an EpiPen without response.  She is concerned because she took his temperature today and it was 100.6.  She was told to come back to the ER immediately if he developed any fever.  Patient has not noticed any difference in the pain in his back.     Past Medical History:  Diagnosis Date  . Arthritis    "generalized" (12/28/2015)  . Chronic sinus complaints   . GERD (gastroesophageal reflux disease)   . H/O cerebral aneurysm repair 2016   "put stent in"  . High cholesterol   . Hypertension   . Kidney stones   . Nonruptured cerebral aneurysm   . Seizures ([place]) 12/27/2015; 12/28/2015  . Stroke [facility])     Patient Active Problem List   Diagnosis Date Noted  . 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 Rectal bleeding   . Constipation 12/16/2017  . Cerebrovascular accident (CVA) due to embolism of right middle cerebral artery ([place]) 03/06/2016  . S/P cerebral aneurysm repair 03/06/2016  . 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 Palpitations 01/05/2016  . Aneurysm, cerebral, nonruptured   . Hyperlipidemia   . Acute CVA (cerebrovascular accident) ([place]) 12/30/2015  . Acute right MCA stroke ([place])   . Focal seizure  ([place]) 12/28/2015  . Essential hypertension 12/28/2015  . Hyponatremia 12/28/2015  . Cerebral aneurysm 05/04/2015    Past Surgical History:  Procedure Laterality Date  . CATARACT EXTRACTION W/ INTRAOCULAR LENS  IMPLANT, BILATERAL Bilateral   . COLONOSCOPY     2013 per patient: done in [place], normal, next colonoscopy due in 10 years  . FOOT FRACTURE SURGERY Left    "pins, etc. in there"  . FRACTURE SURGERY    . RADIOLOGY WITH ANESTHESIA N/A 05/04/2015   Procedure: Pipeline Embolization;  Surgeon: Consuella Lose, MD;  Location: [facility];  Service: Radiology;  Laterality: N/A;  . RADIOLOGY WITH ANESTHESIA N/A 05/31/2015   Procedure: Pipeline Embolization;  Surgeon: Consuella Lose, MD;  Location: [facility];  Service: Radiology;  Laterality: N/A;        Home Medications    Prior to Admission medications   Medication Sig Start Date End Date Taking? Authorizing Provider  amLODipine (NORVASC) 5 MG tablet Take 1 tablet (5 mg total) by mouth daily. 12/18/17   Johnson, Clanford L, MD  aspirin 325 MG tablet Take 325 mg by mouth daily.    [provider]  atorvastatin (LIPITOR) 40 MG tablet Take 40 mg by mouth every other day. At bedtime    [provider]  Brinzolamide-Brimonidine [facility]) 1-0.2 % SUSP Place 1 drop into both eyes every evening. Morning and 8pm    [provider]  cefTRIAXone 2 g in sodium chloride 0.9 % 100 mL Inject 2 g into the vein daily. 12/18/17   Johnson, Clanford L, MD  multivitamin-lutein (OCUVITE-LUTEIN) CAPS capsule Take 1 capsule by mouth daily. 12/18/17   Johnson, Clanford L, MD  potassium chloride 20 MEQ/15ML (10%) SOLN Take 30 mLs (40 mEq total) by mouth daily. 12/18/17   Johnson, Clanford L, MD  TRAVATAN Z 0.004 % SOLN ophthalmic solution Place 1 drop into both eyes 2 (two) times daily.  02/27/15   [provider]  Vancomycin (VANCOCIN) 750-5 MG/150ML-% SOLN Inject 150 mLs (750 mg total) into the vein every 12 (twelve)  hours. 12/18/17   Murlean Iba, MD    Family History Family History  Problem Relation Age of Onset  . Lymphoma Mother   . Dementia Mother   . Aneurysm Father   . Colon cancer Neg Hx     Social History Social History   Tobacco Use  . Smoking status: Current Every Day Smoker    Packs/day: 0.50    Years: 50.00    Pack years: 25.00    Types: Cigarettes  . Smokeless tobacco: Never Used  . Tobacco comment: smokes 1/2 pack a day  Substance Use Topics  . Alcohol use: No    Alcohol/week: 0.0 oz  . Drug use: No     Allergies   Patient has no known allergies.   Review of Systems Review of Systems  Musculoskeletal: Positive for back pain.  Skin: Positive for rash.  All other systems reviewed and are negative.    Physical Exam Updated Vital Signs BP 113/77   Pulse 98   Temp 98.9 F (37.2 C) (Oral)   Resp 18   Ht 5\' 6"  (1.676 m)   Wt 72.6 kg (160 lb)   SpO2 98%   BMI 25.82 kg/m   Physical Exam  Constitutional: He is oriented to person, place, and time. He appears well-developed and well-nourished. No distress.  HENT:  Head: Normocephalic and atraumatic.  Right Ear: Hearing normal.  Left Ear: Hearing normal.  Nose: Nose normal.  Mouth/Throat: Oropharynx is clear and moist and mucous membranes are normal.  Eyes: Pupils are equal, round, and reactive to light. Conjunctivae and EOM are normal.  Neck: Normal range of motion. Neck supple.  Cardiovascular: Regular rhythm, S1 normal and S2 normal. Exam reveals no gallop and no friction rub.  No murmur heard. Pulmonary/Chest: Effort normal and breath sounds normal. No respiratory distress. He exhibits no tenderness.  Abdominal: Soft. Normal appearance and bowel sounds are normal. There is no hepatosplenomegaly. There is no tenderness. There is no rebound, no guarding, no tenderness at McBurney's point and negative Murphy's sign. No hernia.  Musculoskeletal: Normal range of motion.  Neurological: He is alert and  oriented to person, place, and time. He has normal strength. No cranial nerve deficit or sensory deficit. Coordination normal. GCS eye subscore is 4. GCS verbal subscore is 5. GCS motor subscore is 6.  Skin: Skin is warm, dry and intact. No rash (Use, raised, erythematous, confluent) noted. No cyanosis.  Psychiatric: He has a normal mood and affect. His speech is normal and behavior is normal. Thought content normal.  Nursing note and vitals reviewed.    ED Treatments / Results  Labs (all labs ordered are listed, but only abnormal results are displayed) Labs Reviewed  CBC WITH DIFFERENTIAL/PLATELET - Abnormal; Notable for the following components:      Result Value   RBC 4.20 (*)  Lymphs Abs 0.6 (*)    All other components within normal limits  COMPREHENSIVE METABOLIC PANEL - Abnormal; Notable for the following components:   Sodium 127 (*)    Chloride 91 (*)    Glucose, Bld 127 (*)    Calcium 8.8 (*)    Albumin 3.2 (*)    AST 202 (*)    ALT 247 (*)    Alkaline Phosphatase 135 (*)    All other components within normal limits  CULTURE, BLOOD (ROUTINE X 2)  CULTURE, BLOOD (ROUTINE X 2)  URINE CULTURE  URINALYSIS, ROUTINE W REFLEX MICROSCOPIC  SEDIMENTATION RATE  C-REACTIVE PROTEIN  I-STAT CG4 LACTIC ACID, ED    EKG EKG Interpretation  Date/Time:  Thursday January 08 2018 04:14:35 EDT Ventricular Rate:  105 PR Interval:    QRS Duration: 96 QT Interval:  316 QTC Calculation: 418 R Axis:   147 Text Interpretation:  Sinus tachycardia Left posterior fascicular block Inferior infarct, old Confirmed by Orpah Greek [phone]) on 01/08/2018 4:19:55 AM   Radiology Dg Chest Port 1 View  Result Date: 01/08/2018 CLINICAL DATA:  Initial evaluation for acute fever. EXAM: PORTABLE CHEST 1 VIEW COMPARISON:  None. FINDINGS: Cardiac and mediastinal silhouettes are within normal limits. Aortic atherosclerosis. Lungs normally inflated. Mild diffuse chronic coarsening of the  interstitial markings. No consolidative airspace disease. No pulmonary edema or pleural effusion. No discernible pneumothorax, although evaluation mildly limited by patient positioning (supine view). No acute osseous abnormality. IMPRESSION: 1. No radiographic evidence for active cardiopulmonary disease. 2. Aortic atherosclerosis. Electronically Signed   By: Jeannine Boga M.D.   On: 01/08/2018 05:17    Procedures Procedures (including critical care time)  Medications Ordered in ED Medications - No data to display   Initial Impression / Assessment and Plan / ED Course  I have reviewed the triage vital signs and the nursing notes.  Pertinent labs & imaging results that were available during my care of the patient were reviewed by me and considered in my medical decision making (see chart for details).     Patient currently receiving home ceftriaxone and vancomycin for epidural abscess.  He developed a diffuse rash tonight.  Rash does not appear to be urticarial, is more consistent with a drug eruption.  He is not anaphylactic.  It is unclear which antibiotic caused the reaction.  He has no known history of antibiotic or drug reactions.  Patient continues to complain of back pain, but it is unchanged from his baseline.  No neurologic deficits.  I did review his records from [facility].  Needle aspiration and wound cultures have been negative.  Blood cultures negative.  Patient is not in any distress at this time.  He is hemodynamically stable.  Wife reports fever at home tonight.  This is the first-time he has had a fever since leaving the hospital.  Source of fever is unclear at this time.  He does not appear to be septic.  He has a normal lactic acid, white blood cell count is down to 6.4.  Renal function is normal.  He is not hypotensive.  He is mildly tachycardic at arrival, will be given gentle hydration.  He does not require aggressive fluid resuscitation as there is no sign of severe  sepsis.  It is unclear which antibiotic he is allergic to at this point, will require infectious disease input.  He will also require further evaluation for improvement or worsening of his epidural abscess.  Based on this I have recommended  transfer to [facility] where he was originally cared for.  Case was discussed with Dr. Lenoria Chime, on-call for the hospitalist.  Patient will be accepted for transfer, however, there are no beds currently.  He is on a waiting list for transfer.  Dr. Raliegh Ip recommends holding off on antibiotics until he gets to [facility].  Patient does have some tenderness of his right upper quadrant.  His LFTs today are mildly elevated compared to his previous hospitalization.  He does have a known gallstone.  Ultrasound is not currently available, but when they come in in the morning, will obtain right upper quadrant ultrasound to further evaluate.  Final Clinical Impressions(s) / ED Diagnoses   Final diagnoses:  Allergic reaction, initial encounter  Fever, unspecified fever cause  Epidural abscess    ED Discharge Orders    None       Orpah Greek, MD 01/08/18 [phone]

## 2018-01-08 NOTE — ED Notes (Addendum)
Spoke with Shirlean Mylar at [facility], states no bed available at this time and are still waiting on discharges. No known time of bed assignment at this time.

## 2018-01-08 NOTE — ED Notes (Signed)
Spoke with Shirlean Mylar at [facility], still awaiting discharges at this time. No time available on bed placement.

## 2018-01-08 NOTE — ED Notes (Signed)
ED Provider at bedside. 

## 2018-01-08 NOTE — ED Notes (Signed)
Family at bedside. 

## 2018-01-08 NOTE — ED Notes (Signed)
Pt given breakfast meal tray. 

## 2018-01-08 NOTE — ED Notes (Signed)
Damon Parrish at [facility] called to let us know that Pt continues to be on their wait list, and they will call if anything changes.  Nurse informed.

## 2018-01-08 NOTE — ED Provider Notes (Signed)
[facility] has been trying to make a bed available to transfer this patient over to [facility].  It was determined at 3 PM that they still have no beds available and have no idea whatsoever when they would be beds available.  I have consulted the hospitalist here and they stated they will make a consult on the patient   Damon Ferguson, MD 01/08/18 1501

## 2018-01-08 NOTE — ED Notes (Signed)
MD Johnson at bedside.

## 2018-01-08 NOTE — ED Notes (Signed)
Pt had 1 episode of emesis stating he v/ the meal tray he was eating. Denies N/ or need for medication at this time. Appears in no distress. Gown changed.

## 2018-01-08 NOTE — ED Notes (Signed)
Pt and family updated at this time, denies any needs. Pt refused pain medication need at this time.

## 2018-01-08 NOTE — ED Notes (Signed)
Pt placed in a hospital bed at this time

## 2018-01-08 NOTE — Consult Note (Addendum)
[facility] Consult Note  D[facility] [id number]   Patients out patient PCP is [facility] Sos, MD Consult requested in the [facility] by [facility], On 01/08/2018   Reason for consult: Evaluation of Acute Drug Reaction   With History of  Principal Problem:   Adverse drug reaction Active Problems:   Discitis   Epidural abscess   Osteomyelitis of thoracic spine (HCC)   Dermatitis, drug-induced   Past Medical History:  Diagnosis Date  . Arthritis    "generalized" (12/28/2015)  . Chronic sinus complaints   . 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 GERD (gastroesophageal reflux disease)   . H/O cerebral aneurysm repair 2016   "put stent in"  . High cholesterol   . Hypertension   . Kidney stones   . Nonruptured cerebral aneurysm   . Seizures ([place]) 12/27/2015; 12/28/2015  . Stroke [facility])      Past Surgical History:  Procedure Laterality Date  . CATARACT EXTRACTION W/ INTRAOCULAR LENS  IMPLANT, BILATERAL Bilateral   . COLONOSCOPY     2013 per patient: done in [place], normal, next colonoscopy due in 10 years  . FOOT FRACTURE SURGERY Left    "pins, etc. in there"  . FRACTURE SURGERY    . RADIOLOGY WITH ANESTHESIA N/A 05/04/2015   Procedure: Pipeline Embolization;  Surgeon: Consuella Lose, MD;  Location: [facility];  Service: Radiology;  Laterality: N/A;  . RADIOLOGY WITH ANESTHESIA N/A 05/31/2015   Procedure: Pipeline Embolization;  Surgeon: Consuella Lose, MD;  Location: [facility];  Service: Radiology;  Laterality: N/A;    Past Surgical History:  Procedure Laterality Date  . CATARACT EXTRACTION W/ INTRAOCULAR LENS  IMPLANT, BILATERAL Bilateral   . COLONOSCOPY     2013 per patient: done in [place], normal, next colonoscopy due in 10 years  . FOOT FRACTURE SURGERY Left    "pins, etc. in there"  . FRACTURE SURGERY    . RADIOLOGY WITH ANESTHESIA N/A 05/04/2015   Procedure: Pipeline Embolization;  Surgeon: Consuella Lose, MD;  [facility]: [facility];  Service: Radiology;  Laterality: N/A;  . RADIOLOGY WITH ANESTHESIA N/A 05/31/2015   Procedure: Pipeline Embolization;  Surgeon: Consuella Lose, MD;  Location: [facility];  Service: Radiology;  Laterality: N/A;    HPI:-  [facility]amon [facility] [id number] is a 77 y.o. male who initially presented to [facility] with complaints of lower back pain late last month.  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  He was empirically started on vancomycin and ceftriaxone and transferred to [facility] for further evaluation and treatment.  IR was consulted for a bone biopsy.  He was continued on vancomycin and ceftriaxone.  Apparently the blood and biopsy results were without growth.  Infectious disease evaluated the patient and recommended continuing him on vancomycin and ceftriaxone for a total of 8 weeks with the end date being 02/13/2018.  The patient had been subsequently discharged home after that with a home PICC line.  He was also supposed to have some repeat imaging in about 8 weeks.  He had follow-up with infectious disease and orthopedics arrange for outpatient management.  Reportedly he had been at home on IV antibiotics for approximately 2 weeks.  Wife reported that she had administered his nighttime antibiotics and then subsequently noticed that he had developed a raised red rash over the face, neck and upper body. He is not having the rash on the lower body.  He has some mild itching but has not been scratching.  He never had tongue swelling, throat swelling or difficulty breathing.  He was not having any symptoms of pruritus.  His wife gave him an EpiPen dose without response.  He developed a temperature of 100.6 and he was told to come to the emergency room for further evaluation.  The patient has continued to have pain in his back which is essentially unchanged from his chronic  symptoms.  ED course: He was evaluated in the emergency department and was diagnosed with a drug eruption.  He was noted to be not anaphylactic.  It was unclear which antibiotic was causing the drug reaction.  He had Damon neurological deficits.  It was noted that he would need infectious disease input and [facility] was consulted and he was accepted for transfer for by [facility].  Patient is awaiting for a bed at [facility] consult was called for the patient to be seen by the hospitalist while he is awaiting for a bed at [facility].  The patient was noted to have some mild right upper quadrant abdominal pain and an abdominal ultrasound was ordered.  There was notation of gallbladder sludge but [facility]on acute findings noted.  Review of Systems   Positive symptoms for back pain, low-grade fever, rash of the skin, all other systems were completely reviewed and reported as negative.  Social History Social History   Tobacco Use  . Smoking status: Current Every Day Smoker    Packs/day: 0.50    Years: 50.00    Pack years: 25.00    Types: Cigarettes  . Smokeless tobacco: Never Used  . Tobacco comment: smokes 1/2 pack a day  Substance Use Topics  . Alcohol use: Damon    Alcohol/week: 0.0 oz     Family History Family History  Problem Relation Age of Onset  . Lymphoma Mother   . Dementia Mother   . Aneurysm Father   . Colon cancer Neg Hx     Prior to Admission medications   Medication Sig Start Date End Date Taking? Authorizing Provider  amLODipine (NORVASC) 5 MG tablet Take 1 tablet (5 mg total) by mouth daily. Patient taking differently: Take 10 mg by mouth daily.  12/18/17  Yes [facility] Kotowski L, MD  Ascorbic Acid (VITAMIN C) 1000 MG tablet Take 1,000 mg by mouth daily.   Yes [provider]  aspirin 325 MG tablet Take 325 mg by mouth daily.   Yes [provider]  atorvastatin (LIPITOR) 40 MG tablet Take 40 mg by mouth every other day. At bedtime   Yes [provider]   beta carotene 10000 UNIT capsule Take 10,000 Units by mouth daily.   Yes [provider]  Brinzolamide-Brimonidine (SIMBRINZA) 1-0.2 % SUSP Place 1 drop into both eyes every evening. Morning and 8pm   Yes [provider]  cefTRIAXone 2 g in sodium chloride 0.9 % 100 mL Inject 2 g into the vein daily. 12/18/17  Yes Severus Brodzinski L, MD  Cholecalciferol (VITAMIN D-1000 MAX ST) 1000 units tablet Take 1 tablet by mouth daily.   Yes [provider]  fluticasone (FLONASE) 50 MCG/ACT nasal spray Place 1 spray into the nose daily.   Yes [provider]  HYDROcodone-acetaminophen (NORCO/VICODIN) 5-325 MG tablet Take 1 tablet by mouth every 6 (six) hours as needed for moderate pain.   Yes [provider]  Multiple Vitamin (MULTIVITAMIN) capsule Take 1 capsule by mouth daily.   Yes [provider]  multivitamin-lutein (OCUVITE-LUTEIN) CAPS capsule Take 1 capsule by mouth daily. 12/18/17  Yes Sierah Lacewell L, MD  tiZANidine (ZANAFLEX) 2 MG tablet Take 1 tablet by mouth every 4 (four) hours as needed. 12/30/17  Yes [provider]  TRAVATAN Z 0.004 % SOLN ophthalmic solution Place 1 drop into both eyes 2 (two) times daily.  02/27/15  Yes [provider]  Vancomycin (VANCOCIN) 750-5 MG/150ML-% SOLN Inject 150 mLs (750 mg total) into the vein every 12 (twelve) hours. 12/18/17  Yes Ramiro Pangilinan L, MD  vitamin E 400 UNIT capsule Take 400 Units by mouth daily.   Yes [provider]  potassium chloride 20 MEQ/15ML (10%) SOLN Take 30 mLs (40 mEq total) by mouth daily. 12/18/17   Murlean Iba, MD    [facility] Known Allergies  Physical Exam  Intake/Output Summary (Last 24 hours) at 01/08/2018 1459 Last data filed at 01/08/2018 1113 Gross per 24 hour  Intake 1000 ml  Output -  Net 1000 ml   Blood pressure (!) 138/91, pulse (!) 102, temperature 98.9 F (37.2 C), temperature source Oral, resp. rate 16, height 5\' 6"  (1.676 m),  weight 72.6 kg (160 lb), SpO2 99 %.  Constitutional: He is oriented to person, place, and time. He appears well-developed and well-nourished. Damon distress.  HENT: Head: Normocephalic and atraumatic.  Nose: Nose normal.  Mouth/Throat: Oropharynx is clear and moist and mucous membranes are normal.  Eyes: Pupils are equal, round, and reactive to light. Conjunctivae and EOM are normal.  Neck: Normal range of motion. Neck supple.  Cardiovascular: Regular rhythm, S1 normal and S2 normal. Exam reveals Damon gallop and Damon friction rub. Damon murmur heard. Pulmonary/Chest: Effort normal and breath sounds normal. Damon respiratory distress. He exhibits Damon tenderness.  Abdominal: Soft. Normal appearance and bowel sounds are normal. There is Damon hepatosplenomegaly. There is Damon tenderness. There is Damon rebound, Damon guarding, Damon tenderness at McBurney's point and negative Murphy's sign. Damon hernia.  Musculoskeletal: Normal range of motion.  Neurological: He is alert and oriented to person, place, and time. He has normal strength. Damon cranial nerve deficit or sensory deficit. Coordination normal.  Skin: Skin is warm, dry and intact. rash (Use, raised, erythematous, confluent) noted. Damon cyanosis.  Psychiatric: He has a normal mood and affect. His speech is normal and behavior is normal. Thought content normal.   Data Review CBC w Diff:  Lab Results  Component Value Date   WBC 6.4 01/08/2018   HGB 13.4 01/08/2018   HCT 39.6 01/08/2018   PLT 215 01/08/2018   LYMPHOPCT 10 01/08/2018   MONOPCT 5 01/08/2018   EOSPCT 10 01/08/2018   BASOPCT 0 01/08/2018    CMP:  Lab Results  Component Value Date   NA 127 ([facility]) 01/08/2018   K 4.1 01/08/2018   CL 91 ([facility]) 01/08/2018   CO2 23 01/08/2018   BUN 19 01/08/2018   CREATININE 0.97 01/08/2018   PROT 6.5 01/08/2018   ALBUMIN 3.2 ([facility]) 01/08/2018   BILITOT 0.6 01/08/2018   ALKPHOS 135 (H) 01/08/2018   AST 202 (H) 01/08/2018   ALT 247 (H) 01/08/2018    Coagulation:  Lab  Results  Component Value Date   INR 1.26 05/31/2015    Cardiac markers: [facility] results [facility] for: CKMB, TROPONINI, MYOGLOBIN  My personal review of CXR: Damon acute Cardio-Pulm process noted My personal review of EKG: Sinus tachycardia  Personally reviewed [facility] from [facility]:  Abdominal US: IMPRESSION: Layering sludge and/or small stones  within the gallbladder. Damon wall thickening or evidence of acute cholecystitis.  Assessment & Plan  1. Acute Drug Reaction - Pt has developed an acute reaction from ceftriaxone vs vancomycin. The rash is affecting the face, neck and upper body.  Currently the medications are on hold.  He is not having any hemodynamic instability at this time but will need to continue to be monitored.  I agree with transfer to [facility] so that he can be monitored by the surgeon and the infection disease doctors that have been treating him over there.  He is currently waiting for bed but hopeful to have one later today.  We did not give steroids due to him having Damon hemodynamic compromise and he has an active spinal infection.  If he were to decompensate would then initiate steroids and more aggressive treatment as required.  2. Discitis/T9-T10 osteomyelitis- patient has a PICC line and has been on IV antibiotics for the past several weeks.  He had plans to continue through 02/13/18.  Currently the antibiotics are being held until he can be evaluated by infectious disease.  I recommend to obtain repeat blood cultures x2, pain management, supportive therapy.  Follow clinical picture.  Consult his spinal surgery team when he arrives to [facility]   3. Chronic back pain- exacerbated by discitis - Hold acetaminophen products at this time due to liver enzymes. Oxycodone IR ordered as needed for pain.  4. Low grade fever - obtain repeat blood cultures x 2.  Chest xray clear, [facility] other findings of infection [facility].  WBC normal at 6.4.   5. Hypertension - resume home medications.   6. Hyponatremia -  Pt is clinically dehydrated, he was treated with IVFs in the ED.  Will give him an additional bolus of NS while waiting for transfer.   7. History of CVA/dyslipidemia - recommend resuming home statin medication.  8. History of seizure - stable.  9. Transaminitis - Pt has had a stark rise in liver enzymes since it was tested last month.  Abdominal US did not reveal acute findings.  Recommend retesting liver enzymes after hydration and further workup depending on those findings.  Hold acetaminophen products at this time.    Thank you for the consult, we will follow the patient with you while he is waiting to transfer to [facility] MD  [facility] [place], [place] Digital Pager: [phone]

## 2018-01-08 NOTE — ED Notes (Signed)
Pt and family updated about wait for bed at [facility]. States understanding, denies any needs at this time.

## 2018-01-08 NOTE — ED Triage Notes (Addendum)
Pt is from home, is receiving IV antibiotics for an abscess on his back.  Pt's Wife noticed a rash on pt's chest, face, and arms late last evening, had received and epi-pen injection approx 11 pm with no improvement.   Pt's Wife states she is also concerned with pt fever of 100.6

## 2018-01-09 ENCOUNTER — Inpatient Hospital Stay (HOSPITAL_COMMUNITY): Payer: Medicare Other

## 2018-01-09 ENCOUNTER — Encounter (HOSPITAL_COMMUNITY): Payer: Self-pay | Admitting: General Practice

## 2018-01-09 DIAGNOSIS — G8929 Other chronic pain: Secondary | ICD-10-CM | POA: Diagnosis not present

## 2018-01-09 DIAGNOSIS — E860 Dehydration: Secondary | ICD-10-CM | POA: Diagnosis not present

## 2018-01-09 DIAGNOSIS — T50905A Adverse effect of unspecified drugs, medicaments and biological substances, initial encounter: Secondary | ICD-10-CM | POA: Diagnosis not present

## 2018-01-09 DIAGNOSIS — E871 Hypo-osmolality and hyponatremia: Secondary | ICD-10-CM | POA: Diagnosis not present

## 2018-01-09 DIAGNOSIS — L270 Generalized skin eruption due to drugs and medicaments taken internally: Secondary | ICD-10-CM | POA: Diagnosis present

## 2018-01-09 DIAGNOSIS — K828 Other specified diseases of gallbladder: Secondary | ICD-10-CM | POA: Diagnosis not present

## 2018-01-09 DIAGNOSIS — G9520 Unspecified cord compression: Secondary | ICD-10-CM | POA: Diagnosis not present

## 2018-01-09 DIAGNOSIS — Z452 Encounter for adjustment and management of vascular access device: Secondary | ICD-10-CM | POA: Diagnosis not present

## 2018-01-09 DIAGNOSIS — T368X5A Adverse effect of other systemic antibiotics, initial encounter: Secondary | ICD-10-CM | POA: Diagnosis present

## 2018-01-09 DIAGNOSIS — F17210 Nicotine dependence, cigarettes, uncomplicated: Secondary | ICD-10-CM | POA: Diagnosis present

## 2018-01-09 DIAGNOSIS — G062 Extradural and subdural abscess, unspecified: Secondary | ICD-10-CM | POA: Diagnosis present

## 2018-01-09 DIAGNOSIS — Z95828 Presence of other vascular implants and grafts: Secondary | ICD-10-CM | POA: Diagnosis not present

## 2018-01-09 DIAGNOSIS — L8990 Pressure ulcer of unspecified site, unspecified stage: Secondary | ICD-10-CM

## 2018-01-09 DIAGNOSIS — I10 Essential (primary) hypertension: Secondary | ICD-10-CM | POA: Diagnosis not present

## 2018-01-09 DIAGNOSIS — Z79899 Other long term (current) drug therapy: Secondary | ICD-10-CM | POA: Diagnosis not present

## 2018-01-09 DIAGNOSIS — Z9841 Cataract extraction status, right eye: Secondary | ICD-10-CM | POA: Diagnosis not present

## 2018-01-09 DIAGNOSIS — Z7982 Long term (current) use of aspirin: Secondary | ICD-10-CM | POA: Diagnosis not present

## 2018-01-09 DIAGNOSIS — Z66 Do not resuscitate: Secondary | ICD-10-CM | POA: Diagnosis not present

## 2018-01-09 DIAGNOSIS — Z807 Family history of other malignant neoplasms of lymphoid, hematopoietic and related tissues: Secondary | ICD-10-CM | POA: Diagnosis not present

## 2018-01-09 DIAGNOSIS — K5900 Constipation, unspecified: Secondary | ICD-10-CM | POA: Diagnosis not present

## 2018-01-09 DIAGNOSIS — T361X5A Adverse effect of cephalosporins and other beta-lactam antibiotics, initial encounter: Secondary | ICD-10-CM | POA: Diagnosis present

## 2018-01-09 DIAGNOSIS — G061 Intraspinal abscess and granuloma: Secondary | ICD-10-CM | POA: Diagnosis present

## 2018-01-09 DIAGNOSIS — M4624 Osteomyelitis of vertebra, thoracic region: Secondary | ICD-10-CM | POA: Diagnosis present

## 2018-01-09 DIAGNOSIS — M4644 Discitis, unspecified, thoracic region: Secondary | ICD-10-CM | POA: Diagnosis present

## 2018-01-09 DIAGNOSIS — K219 Gastro-esophageal reflux disease without esophagitis: Secondary | ICD-10-CM | POA: Diagnosis not present

## 2018-01-09 DIAGNOSIS — Z961 Presence of intraocular lens: Secondary | ICD-10-CM | POA: Diagnosis not present

## 2018-01-09 DIAGNOSIS — T50905S Adverse effect of unspecified drugs, medicaments and biological substances, sequela: Secondary | ICD-10-CM | POA: Diagnosis not present

## 2018-01-09 DIAGNOSIS — T50905D Adverse effect of unspecified drugs, medicaments and biological substances, subsequent encounter: Secondary | ICD-10-CM

## 2018-01-09 DIAGNOSIS — T368X5S Adverse effect of other systemic antibiotics, sequela: Secondary | ICD-10-CM | POA: Diagnosis not present

## 2018-01-09 DIAGNOSIS — Z881 Allergy status to other antibiotic agents status: Secondary | ICD-10-CM | POA: Diagnosis not present

## 2018-01-09 DIAGNOSIS — Z87442 Personal history of urinary calculi: Secondary | ICD-10-CM | POA: Diagnosis not present

## 2018-01-09 DIAGNOSIS — R740 Nonspecific elevation of levels of transaminase and lactic acid dehydrogenase [LDH]: Secondary | ICD-10-CM | POA: Diagnosis not present

## 2018-01-09 DIAGNOSIS — T368X5D Adverse effect of other systemic antibiotics, subsequent encounter: Secondary | ICD-10-CM | POA: Diagnosis not present

## 2018-01-09 DIAGNOSIS — R21 Rash and other nonspecific skin eruption: Secondary | ICD-10-CM | POA: Diagnosis not present

## 2018-01-09 DIAGNOSIS — E7800 Pure hypercholesterolemia, unspecified: Secondary | ICD-10-CM | POA: Diagnosis not present

## 2018-01-09 DIAGNOSIS — Z8673 Personal history of transient ischemic attack (TIA), and cerebral infarction without residual deficits: Secondary | ICD-10-CM | POA: Diagnosis not present

## 2018-01-09 DIAGNOSIS — D721 Eosinophilia: Secondary | ICD-10-CM | POA: Diagnosis present

## 2018-01-09 DIAGNOSIS — R140 Abdominal distension (gaseous): Secondary | ICD-10-CM | POA: Diagnosis not present

## 2018-01-09 LAB — CBC WITH DIFFERENTIAL/PLATELET
BASOS PCT: 0 %
Basophils Absolute: 0 10*3/uL (ref 0.0–0.1)
Eosinophils Absolute: 0.5 10*3/uL (ref 0.0–0.7)
Eosinophils Relative: 5 %
HCT: 36.6 % — ABNORMAL LOW (ref 39.0–52.0)
HEMOGLOBIN: 12.5 g/dL — AB (ref 13.0–17.0)
LYMPHS ABS: 1.2 10*3/uL (ref 0.7–4.0)
Lymphocytes Relative: 13 %
MCH: 31.6 pg (ref 26.0–34.0)
MCHC: 34.2 g/dL (ref 30.0–36.0)
MCV: 92.7 fL (ref 78.0–100.0)
Monocytes Absolute: 0.7 10*3/uL (ref 0.1–1.0)
Monocytes Relative: 8 %
NEUTROS PCT: 74 %
Neutro Abs: 6.8 10*3/uL (ref 1.7–7.7)
Platelets: 228 10*3/uL (ref 150–400)
RBC: 3.95 MIL/uL — AB (ref 4.22–5.81)
RDW: 13.8 % (ref 11.5–15.5)
WBC: 9.2 10*3/uL (ref 4.0–10.5)

## 2018-01-09 LAB — COMPREHENSIVE METABOLIC PANEL
ALBUMIN: 2.7 g/dL — AB (ref 3.5–5.0)
ALK PHOS: 103 U/L (ref 38–126)
ALT: 433 U/L — AB (ref 17–63)
AST: 247 U/L — ABNORMAL HIGH (ref 15–41)
Anion gap: 12 (ref 5–15)
BUN: 21 mg/dL — AB (ref 6–20)
CO2: 21 mmol/L — ABNORMAL LOW (ref 22–32)
CREATININE: 0.71 mg/dL (ref 0.61–1.24)
Calcium: 8.3 mg/dL — ABNORMAL LOW (ref 8.9–10.3)
Chloride: 94 mmol/L — ABNORMAL LOW (ref 101–111)
GFR calc Af Amer: >60 mL/min (ref >60)
GFR calc non Af Amer: >60 mL/min (ref >60)
GLUCOSE: 150 mg/dL — AB (ref 65–99)
Potassium: 3.5 mmol/L (ref 3.5–5.1)
SODIUM: 127 mmol/L — AB (ref 135–145)
Total Bilirubin: 0.6 mg/dL (ref 0.3–1.2)
Total Protein: 5.5 g/dL — ABNORMAL LOW (ref 6.5–8.1)

## 2018-01-09 LAB — URINE CULTURE: CULTURE: NO GROWTH

## 2018-01-09 LAB — CK: Total CK: 25 U/L — ABNORMAL LOW (ref 49–397)

## 2018-01-09 LAB — C-REACTIVE PROTEIN: CRP: 9.1 mg/dL — ABNORMAL HIGH (ref <1.0)

## 2018-01-09 MED ORDER — ENSURE ENLIVE PO LIQD
237.0000 mL | Freq: Two times a day (BID) | ORAL | Status: DC
  Administered 2018-01-10 – 2018-01-17 (×11): 237 mL via ORAL

## 2018-01-09 MED ORDER — ADULT MULTIVITAMIN W/MINERALS CH
1.0000 | ORAL_TABLET | Freq: Every day | ORAL | Status: DC
  Administered 2018-01-09 – 2018-01-15 (×7): 1 via ORAL
  Filled 2018-01-09 (×7): qty 1

## 2018-01-09 MED ORDER — ACETAMINOPHEN 650 MG RE SUPP: 650.0000 mg | Freq: Four times a day (QID) | RECTAL | Status: DC | PRN

## 2018-01-09 MED ORDER — ENOXAPARIN SODIUM 40 MG/0.4ML ~~LOC~~ SOLN
40.0000 mg | SUBCUTANEOUS | Status: DC
  Administered 2018-01-09 – 2018-01-17 (×9): 40 mg via SUBCUTANEOUS
  Filled 2018-01-09 (×9): qty 0.4

## 2018-01-09 MED ORDER — SODIUM CHLORIDE 0.45 % IV SOLN
INTRAVENOUS | Status: DC
  Administered 2018-01-09: 08:00:00 via INTRAVENOUS

## 2018-01-09 MED ORDER — NON FORMULARY: 1.0000 [drp] | Freq: Two times a day (BID) | Status: DC

## 2018-01-09 MED ORDER — BRIMONIDINE TARTRATE 0.2 % OP SOLN
1.0000 [drp] | Freq: Two times a day (BID) | OPHTHALMIC | Status: DC
  Administered 2018-01-09 – 2018-01-17 (×16): 1 [drp] via OPHTHALMIC
  Filled 2018-01-09 (×3): qty 5

## 2018-01-09 MED ORDER — DOCUSATE SODIUM 100 MG PO CAPS
100.0000 mg | ORAL_CAPSULE | Freq: Two times a day (BID) | ORAL | Status: DC
  Administered 2018-01-09 – 2018-01-17 (×17): 100 mg via ORAL
  Filled 2018-01-09 (×16): qty 1

## 2018-01-09 MED ORDER — LATANOPROST 0.005 % OP SOLN
1.0000 [drp] | Freq: Every day | OPHTHALMIC | Status: DC
  Administered 2018-01-10 – 2018-01-16 (×8): 1 [drp] via OPHTHALMIC
  Filled 2018-01-09: qty 2.5

## 2018-01-09 MED ORDER — SODIUM CHLORIDE 0.9% FLUSH
10.0000 mL | INTRAVENOUS | Status: DC | PRN
  Administered 2018-01-10: 40 mL
  Administered 2018-01-11: 20 mL
  Filled 2018-01-09 (×2): qty 40

## 2018-01-09 MED ORDER — SODIUM CHLORIDE 0.9% FLUSH
10.0000 mL | Freq: Two times a day (BID) | INTRAVENOUS | Status: DC
  Administered 2018-01-09 – 2018-01-16 (×8): 10 mL

## 2018-01-09 MED ORDER — ONDANSETRON HCL 4 MG/2ML IJ SOLN
4.0000 mg | Freq: Four times a day (QID) | INTRAMUSCULAR | Status: DC | PRN
Start: 2018-01-09 — End: 2018-01-17

## 2018-01-09 MED ORDER — BRINZOLAMIDE 1 % OP SUSP
1.0000 [drp] | Freq: Two times a day (BID) | OPHTHALMIC | Status: DC
  Administered 2018-01-09 – 2018-01-17 (×17): 1 [drp] via OPHTHALMIC
  Filled 2018-01-09 (×2): qty 10

## 2018-01-09 MED ORDER — LORATADINE 10 MG PO TABS
10.0000 mg | ORAL_TABLET | Freq: Every day | ORAL | Status: DC
  Administered 2018-01-09 – 2018-01-17 (×9): 10 mg via ORAL
  Filled 2018-01-09 (×10): qty 1

## 2018-01-09 MED ORDER — OCUVITE-LUTEIN PO CAPS
1.0000 | ORAL_CAPSULE | Freq: Every day | ORAL | Status: DC
  Administered 2018-01-09 – 2018-01-12 (×3): 1 via ORAL
  Filled 2018-01-09 (×7): qty 1

## 2018-01-09 MED ORDER — ACETAMINOPHEN 325 MG PO TABS: 650.0000 mg | ORAL_TABLET | Freq: Four times a day (QID) | ORAL | Status: DC | PRN

## 2018-01-09 MED ORDER — SODIUM CHLORIDE 0.9 % IV SOLN
INTRAVENOUS | Status: AC
  Administered 2018-01-09 – 2018-01-10 (×2): via INTRAVENOUS

## 2018-01-09 MED ORDER — ONDANSETRON HCL 4 MG PO TABS: 4.0000 mg | ORAL_TABLET | Freq: Four times a day (QID) | ORAL | Status: DC | PRN

## 2018-01-09 MED ORDER — SODIUM CHLORIDE 0.9 % IV SOLN
580.0000 mg | INTRAVENOUS | Status: DC
  Administered 2018-01-09 – 2018-01-17 (×8): 580 mg via INTRAVENOUS
  Filled 2018-01-09 (×12): qty 11.6

## 2018-01-09 NOTE — ED Notes (Signed)
[facility] en route to pick up pt. Pt and pt family aware.

## 2018-01-09 NOTE — ED Notes (Signed)
[facility] and notified us that there still isn't a bed available at their facility. They are still working on getting on.

## 2018-01-09 NOTE — Progress Notes (Signed)
Pharmacy Antibiotic Note  [facility] is a 77 y.o. male admitted on 01/08/2018 with osteomyelitis.  Pharmacy has been consulted for Daptomycin dosing.  Recommended by ID service.    Plan: Daptomycin 8mg /KG IV q24hrs (580mg ) Monitor labs, progress, c/s  Height: 5\' 6"  (167.6 cm) Weight: 160 lb (72.6 kg) IBW/kg (Calculated) : 63.8  Temp (24hrs), Avg:98.5 F (36.9 C), Min:98.5 F (36.9 C), Max:98.5 F (36.9 C)  Recent Labs  Lab 01/08/18 0542 01/08/18 0550 01/09/18 0855  WBC 6.4  --  9.2  CREATININE 0.97  --  0.71  LATICACIDVEN  --  1.64  --     Estimated Creatinine Clearance: 69.8 mL/min (by C-G formula based on SCr of 0.71 mg/dL).    Allergies  Allergen Reactions  . Cephalosporins Rash  . Vancomycin Rash   Antimicrobials this admission: Daptomycin 4/19 >>   Dose adjustments this admission:  Microbiology results:  BCx: pending  UCx: pending   Sputum:    MRSA PCR:   Thank you for allowing pharmacy to be a part of this patient's care.  Hart Robinsons A 01/09/2018 11:12 AM

## 2018-01-09 NOTE — ED Notes (Signed)
ED Secretary contacted [facility] regarding pt transport. [facility] reported due to weather would not be dispatching transport until given all clear. Pt and pt family aware.

## 2018-01-09 NOTE — ED Notes (Signed)
[facility] called to inform that they did not have a bed available yet.

## 2018-01-09 NOTE — H&P (Signed)
History and Physical  Damon Parrish TKZ:[id number] DOB: 04/27/1941 DOA: 01/08/2018  Referring physician: Roderic Palau MD PCP: Damon Sos, MD   Chief Complaint: Rash  HPI: Damon Parrish is a 77 y.o. male who initially presented to [facility] with complaints of lower back pain late last month.  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  He was empirically started on vancomycin and ceftriaxone and transferred to [facility] health for further evaluation and treatment.  IR was consulted for a bone biopsy.  He was continued on vancomycin and ceftriaxone.  Apparently the blood and biopsy results were without growth.  Infectious disease evaluated the patient and recommended continuing him on vancomycin and ceftriaxone for a total of 8 weeks with the end date being 02/13/2018.  The patient had been subsequently discharged home after that with a home PICC line.  He was also supposed to have some repeat imaging in about 8 weeks.  He had follow-up with infectious disease and orthopedics arrange for outpatient management.  Reportedly he had been at home on IV antibiotics for approximately 2 weeks.  Wife reported that she had administered his nighttime antibiotics and then subsequently noticed that he had developed a raised red rash over the face, neck and upper body. He is not having the rash on the lower body at this time.  He has some mild itching but has not been scratching.  He never had tongue swelling, throat swelling or difficulty breathing.  He was not having any symptoms of pruritus.  His wife gave him an EpiPen dose without response.  He developed a temperature of 100.6 and he was told to come to the emergency room for further evaluation.  The patient has continued to have pain in his back which is essentially unchanged from his chronic symptoms.  ED course: He was evaluated in the emergency  department and was diagnosed with a drug eruption.  He was noted to be not anaphylactic.  It was unclear which antibiotic was causing the drug reaction.  He had no neurological deficits.  It was noted that he would need infectious disease input and [facility] was consulted and he was accepted for transfer for by Dr. Lenoria Chime.  Patient is awaiting for a bed at [place] consult was called for the patient to be seen by the hospitalist while he was waiting for a bed at [facility].  The patient was noted to have some mild right upper quadrant abdominal pain and an abdominal ultrasound was ordered.  There was notation of gallbladder sludge and small stones but no acute findings of cholecystitis noted.  Unfortunately patient unable to get a bed at [facility] after 30+ hours of waiting in ED and ED providers requested that he be admitted to [facility] and he will be admitted to [facility] as a bed should be available later today per patient placement team.   Review of Systems: All systems reviewed and apart from history of presenting illness, are negative.  Past Medical History:  Diagnosis Date  . Arthritis    "generalized" (12/28/2015)  . Chronic sinus complaints   . 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 GERD (gastroesophageal reflux disease)   . H/O cerebral aneurysm repair 2016   "put stent in"  . High cholesterol   . Hypertension   . Kidney stones   . Nonruptured cerebral aneurysm   . Seizures ([place]) 12/27/2015; 12/28/2015  . Stroke [facility])  Past Surgical History:  Procedure Laterality Date  . CATARACT EXTRACTION W/ INTRAOCULAR LENS  IMPLANT, BILATERAL Bilateral   . COLONOSCOPY     2013 per patient: done in [place], normal, next colonoscopy due in 10 years  . FOOT FRACTURE SURGERY Left    "pins, etc. in there"  . FRACTURE SURGERY    . RADIOLOGY WITH ANESTHESIA N/A 05/04/2015   Procedure: Pipeline Embolization;  Surgeon: Consuella Lose, MD;  Location: [facility];  Service: Radiology;  Laterality: N/A;  . RADIOLOGY WITH  ANESTHESIA N/A 05/31/2015   Procedure: Pipeline Embolization;  Surgeon: Consuella Lose, MD;  Location: [place];  Service: Radiology;  Laterality: N/A;   Social History:  reports that he has been smoking cigarettes.  He has a 25.00 pack-year smoking history. He has never used smokeless tobacco. He reports that he does not drink alcohol or use drugs.  No Known Allergies  Family History  Problem Relation Age of Onset  . Lymphoma Mother   . Dementia Mother   . Aneurysm Father   . Colon cancer Neg Hx     Prior to Admission medications   Medication Sig Start Date End Date Taking? Authorizing Provider  amLODipine (NORVASC) 5 MG tablet Take 1 tablet (5 mg total) by mouth daily. Patient taking differently: Take 10 mg by mouth daily.  12/18/17  Yes Johnson, Clanford L, MD  Ascorbic Acid (VITAMIN C) 1000 MG tablet Take 1,000 mg by mouth daily.   Yes [provider]  aspirin 325 MG tablet Take 325 mg by mouth daily.   Yes [provider]  atorvastatin (LIPITOR) 40 MG tablet Take 40 mg by mouth every other day. At bedtime   Yes [provider]  beta carotene 10000 UNIT capsule Take 10,000 Units by mouth daily.   Yes [provider]  Brinzolamide-Brimonidine (SIMBRINZA) 1-0.2 % SUSP Place 1 drop into both eyes every evening. Morning and 8pm   Yes [provider]  cefTRIAXone 2 g in sodium chloride 0.9 % 100 mL Inject 2 g into the vein daily. 12/18/17  Yes Johnson, Clanford L, MD  Cholecalciferol (VITAMIN D-1000 MAX ST) 1000 units tablet Take 1 tablet by mouth daily.   Yes [provider]  fluticasone (FLONASE) 50 MCG/ACT nasal spray Place 1 spray into the nose daily.   Yes [provider]  HYDROcodone-acetaminophen (NORCO/VICODIN) 5-325 MG tablet Take 1 tablet by mouth every 6 (six) hours as needed for moderate pain.   Yes [provider]  Multiple Vitamin (MULTIVITAMIN) capsule Take 1 capsule by mouth daily.   Yes [provider]  multivitamin-lutein (OCUVITE-LUTEIN) CAPS capsule Take 1 capsule by mouth daily. 12/18/17  Yes Johnson, Clanford L, MD  tiZANidine (ZANAFLEX) 2 MG tablet Take 1 tablet by mouth every 4 (four) hours as needed. 12/30/17  Yes [provider]  TRAVATAN Z 0.004 % SOLN ophthalmic solution Place 1 drop into both eyes 2 (two) times daily.  02/27/15  Yes [provider]  Vancomycin (VANCOCIN) 750-5 MG/150ML-% SOLN Inject 150 mLs (750 mg total) into the vein every 12 (twelve) hours. 12/18/17  Yes Johnson, Clanford L, MD  vitamin E 400 UNIT capsule Take 400 Units by mouth daily.   Yes [provider]   Physical Exam: Vitals:   01/09/18 0500 01/09/18 0530 01/09/18 0600 01/09/18 0700  BP: 100/60 136/81 136/82 129/84  Pulse: (!) 110     Resp: (!) 21 20 17 19   Temp:      TempSrc:  SpO2: 97%     Weight:      Height:        General exam: Moderately built and nourished patient, lying  supine on the gurney in no obvious distress.  Head, eyes and ENT: Nontraumatic and normocephalic. Pupils equally reacting to light and accommodation. Oral mucosa moist.  Neck: Supple. No JVD, carotid bruit or thyromegaly.  Lymphatics: No lymphadenopathy.  Respiratory system: Clear to auscultation. No increased work of breathing.  Cardiovascular system: S1 and S2 heard, RRR. No JVD, murmurs, gallops, clicks or pedal edema.  Gastrointestinal system: Abdomen is nondistended, soft and nontender. Normal bowel sounds heard. No organomegaly or masses appreciated.  Central nervous system: Alert and oriented. No focal neurological deficits.  Extremities: Symmetric 5 x 5 power. Peripheral pulses symmetrically felt.   Skin: diffuse macular reddish rash of the head, neck, and upper body, torso, not involving the lower body.             Musculoskeletal system: Negative exam.  Psychiatry: Pleasant and cooperative.  Labs on Admission:  Basic Metabolic Panel: Recent Labs    Lab 01/08/18 0542  NA 127*  K 4.1  CL 91*  CO2 23  GLUCOSE 127*  BUN 19  CREATININE 0.97  CALCIUM 8.8*   Liver Function Tests: Recent Labs  Lab 01/08/18 0542  AST 202*  ALT 247*  ALKPHOS 135*  BILITOT 0.6  PROT 6.5  ALBUMIN 3.2*   No results for input(s): LIPASE, AMYLASE in the last 168 hours. No results for input(s): AMMONIA in the last 168 hours. CBC: Recent Labs  Lab 01/08/18 0542  WBC 6.4  NEUTROABS 4.8  HGB 13.4  HCT 39.6  MCV 94.3  PLT 215   Cardiac Enzymes: No results for input(s): CKTOTAL, CKMB, CKMBINDEX, TROPONINI in the last 168 hours.  BNP (last 3 results) No results for input(s): PROBNP in the last 8760 hours. CBG: No results for input(s): GLUCAP in the last 168 hours.  Radiological Exams on Admission: Dg Chest Port 1 View  Result Date: 01/08/2018 CLINICAL DATA:  Initial evaluation for acute fever. EXAM: PORTABLE CHEST 1 VIEW COMPARISON:  None. FINDINGS: Cardiac and mediastinal silhouettes are within normal limits. Aortic atherosclerosis. Lungs normally inflated. Mild diffuse chronic coarsening of the interstitial markings. No consolidative airspace disease. No pulmonary edema or pleural effusion. No discernible pneumothorax, although evaluation mildly limited by patient positioning (supine view). No acute osseous abnormality. IMPRESSION: 1. No radiographic evidence for active cardiopulmonary disease. 2. Aortic atherosclerosis. Electronically Signed   By: Jeannine Boga M.D.   On: 01/08/2018 05:17   US Abdomen Limited Ruq  Result Date: 01/08/2018 CLINICAL DATA:  Fever, elevated LFTs EXAM: ULTRASOUND ABDOMEN LIMITED RIGHT UPPER QUADRANT COMPARISON:  CT 12/16/2017 FINDINGS: Gallbladder: Multiple mobile layering small stones and/or sludge. No wall thickening or sonographic Murphy's sign. Common bile duct: Diameter: Normal caliber, 2 mm Liver: No focal lesion identified. Within normal limits in parenchymal echogenicity. Portal vein is patent on color  Doppler imaging with normal direction of blood flow towards the liver. IMPRESSION: Layering sludge and/or small stones within the gallbladder. No wall thickening or evidence of acute cholecystitis. Electronically Signed   By: Rolm Baptise M.D.   On: 01/08/2018 08:13   EKG: Independently reviewed.   Assessment/Plan Principal Problem:   Adverse drug reaction Active Problems:   Discitis   Epidural abscess   Osteomyelitis of thoracic spine (HCC)   Dermatitis, drug-induced   Red man syndrome  1. Acute Adverse Drug Reaction - Pt has  developed an acute reaction from either ceftriaxone or vancomycin but suspect it was the cephalosporin.  The rash is affecting the face, neck and upper body but it is spreading some to lower extremities today.  Symptoms of itching controlled by loratadine. Following closely.   Currently the medications are on hold.  He is not having any hemodynamic instability at this time but will need to continue to be monitored.  He is being admitted to [facility] for ID consult.  I have discussed with Dr. Baxter Flattery and would start daptomycin 8 mg/kg, check CK level, obtain hepatitis serologies and follow.  No eosinophilia at this time.  2. Discitis/T9-T10 osteomyelitis- patient has a PICC line and has been on IV antibiotics for the past several weeks.  He had plans to continue through 02/13/18.  Holding the ceftriaxone and vancomycin.  I spoke with ID Dr. Baxter Flattery and would start daptomycin 8 mg/kg.   Follow repeat blood cultures x2, pain management, supportive therapy.  Follow clinical picture.     3. Chronic back pain- exacerbated by discitis - Hold acetaminophen products at this time due to liver enzymes. Oxycodone IR ordered as needed for pain.  4. Low grade fever - obtain repeat blood cultures x 2.  Chest xray clear, no other findings of infection found.  WBC normal at 6.4.   5. Hypertension - resume home medications.   6. Hyponatremia - Pt appears clinically dehydrated, he was treated  with IVFs in the ED.  Will give him an additional bolus of NS while waiting for transfer.   7. History of CVA/dyslipidemia - Holding statin medication for now given acute rise in liver enzymes.  8. History of seizure - stable. 9. Transaminitis - Pt has had a stark rise in liver enzymes since it was tested last month.  Abdominal US did not reveal acute findings.  I spoke with ID and will check hepatitis serologies.   Follow enzymes and consult GI if enzymes continue to rise.  Suspect adverse drug reaction from cephalosporins contributing to this but will follow closely.    DVT Prophylaxis: enoxaparin Code Status: full   Family Communication: wife  Disposition Plan: Admit to [place].    Time spent: 65 mins  Irwin Brakeman, MD [facility] Pager [phone]  If 7PM-7AM, please contact night-coverage [email or web address] Password [facility] 01/09/2018, 8:28 AM

## 2018-01-09 NOTE — ED Notes (Signed)
[facility] just left ED with pt. Calling 5N to notify pt en route.

## 2018-01-09 NOTE — ED Notes (Signed)
ED Provider at bedside. 

## 2018-01-10 LAB — HEPATIC FUNCTION PANEL
ALBUMIN: 2.2 g/dL — AB (ref 3.5–5.0)
ALT: 306 U/L — ABNORMAL HIGH (ref 17–63)
AST: 147 U/L — AB (ref 15–41)
Alkaline Phosphatase: 82 U/L (ref 38–126)
Bilirubin, Direct: 0.1 mg/dL (ref 0.1–0.5)
Indirect Bilirubin: 0.5 mg/dL (ref 0.3–0.9)
TOTAL PROTEIN: 4.5 g/dL — AB (ref 6.5–8.1)
Total Bilirubin: 0.6 mg/dL (ref 0.3–1.2)

## 2018-01-10 LAB — HEPATITIS PANEL, ACUTE
HCV Ab: <0.1 s/co ratio (ref 0.0–0.9)
HEP A IGM: NEGATIVE
HEP B C IGM: NEGATIVE
Hepatitis B Surface Ag: NEGATIVE

## 2018-01-10 LAB — PROTIME-INR
INR: 1.46
Prothrombin Time: 17.6 seconds — ABNORMAL HIGH (ref 11.4–15.2)

## 2018-01-10 NOTE — Evaluation (Signed)
Physical Therapy Evaluation Patient Details Name: Damon Parrish MRN: [id number] DOB: 1941-07-22 Today's Date: 01/10/2018   History of Present Illness  77 y.o. Male who initially presented to [facility] with complaints of lower back pain late last month. 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  He was empirically started on vancomycin and ceftriaxone and transferred to [facility] health for further evaluation and treatment. Reportedly he had been at home on IV antibiotics for approximately 2 weeks.  He then started to develop fevers and generalized rash. His wife gave him an EpiPen dose without response.   Clinical Impression  PTA pt was ambulating household distances with a cane, and required assistance for bathing, grooming and iADLs. Pt currently is severely limited by increased back pain and generalized weakness from decreased activity. Pt currently is min guard for bed mobility, transfers and ambulation of 3 feet to and from recliner. Pt only able to tolerate 5 minutes in recliner before requesting to return to lying flat in the bed. PT is recommending HHPT at d/c to regain strength and mobility to safely navigate in his home environment. PT will continue to follow acutely.     Follow Up Recommendations Home health PT;Supervision/Assistance - 24 hour    Equipment Recommendations  None recommended by PT    Recommendations for Other Services OT consult     Precautions / Restrictions Restrictions Weight Bearing Restrictions: No      Mobility  Bed Mobility Overal bed mobility: Needs Assistance Bed Mobility: Supine to Sit;Sit to Supine     Supine to sit: Min guard Sit to supine: Min guard   General bed mobility comments: discussed sidelying to sit and sit to sidelying but once started pt panic and twisted spine to move more quickly which increased his pain  Transfers Overall  transfer level: Needs assistance Equipment used: Rolling walker (2 wheeled) Transfers: Sit to/from Stand Sit to Stand: Min guard         General transfer comment: very antalgic with movement however requires only min guard for safety with power up and steadying  Ambulation/Gait Ambulation/Gait assistance: Min guard Ambulation Distance (Feet): 3 Feet Assistive device: Rolling walker (2 wheeled) Gait Pattern/deviations: Step-through pattern;Decreased step length - right;Decreased step length - left;Shuffle;Antalgic Gait velocity: slowed Gait velocity interpretation: <1.8 ft/sec, indicate of risk for recurrent falls General Gait Details: min guard for safety for small shuffling steps to and from recliner    Balance Overall balance assessment: Needs assistance Sitting-balance support: No upper extremity supported;Feet supported Sitting balance-Leahy Scale: Fair     Standing balance support: Bilateral upper extremity supported Standing balance-Leahy Scale: Fair                               Pertinent Vitals/Pain Pain Assessment: 0-10 Pain Score: 6  Pain Location: back  Pain Descriptors / Indicators: Aching;Constant Pain Intervention(s): Limited activity within patient's tolerance;Monitored during session;Premedicated before session    Home Living Family/patient expects to be discharged to:: Private residence Living Arrangements: Spouse/significant other Available Help at Discharge: Family;Available 24 hours/day Type of Home: House Home Access: Stairs to enter   [employer] of Steps: 1 Home Layout: One level Home Equipment: Walker - 2 wheels;Cane - single point;Shower seat - built in;Grab bars - tub/shower;Wheelchair - manual      Prior Function Level of Independence: Needs assistance      ADL's /  Homemaking Assistance Needed: dressing, bathing and eating when on his back            Extremity/Trunk Assessment   Upper Extremity  Assessment Upper Extremity Assessment: Generalized weakness    Lower Extremity Assessment Lower Extremity Assessment: Generalized weakness    Cervical / Trunk Assessment Cervical / Trunk Assessment: Other exceptions(T9-T10 osteomyelitis )  Communication   Communication: No difficulties  Cognition Arousal/Alertness: Awake/alert Behavior During Therapy: WFL for tasks assessed/performed Overall Cognitive Status: Within Functional Limits for tasks assessed                                        General Comments General comments (skin integrity, edema, etc.): Pt wife present throughout session        Assessment/Plan    PT Assessment Patient needs continued PT services  PT Problem List Pain;Decreased strength;Decreased range of motion;Decreased activity tolerance;Decreased mobility;Decreased safety awareness       PT Treatment Interventions DME instruction;Gait training;Functional mobility training;Therapeutic activities;Therapeutic exercise;Patient/family education    PT Goals (Current goals can be found in the Care Plan section)  Acute Rehab PT Goals Patient Stated Goal: have less pain PT Goal Formulation: With patient/family Time For Goal Achievement: 01/24/18 Potential to Achieve Goals: Fair    Frequency Min 3X/week    AM-PAC PT "6 Clicks" Daily Activity  Outcome Measure Difficulty turning over in bed (including adjusting bedclothes, sheets and blankets)?: Unable Difficulty moving from lying on back to sitting on the side of the bed? : Unable Difficulty sitting down on and standing up from a chair with arms (e.g., wheelchair, bedside commode, etc,.)?: Unable Help needed moving to and from a bed to chair (including a wheelchair)?: A Little Help needed walking in hospital room?: A Little Help needed climbing 3-5 steps with a railing? : A Lot 6 Click Score: 11    End of Session Equipment Utilized During Treatment: Gait belt Activity Tolerance: Patient  limited by pain Patient left: in bed;with call bell/phone within reach;with family/visitor present Nurse Communication: Mobility status PT Visit Diagnosis: Pain;Other abnormalities of gait and mobility (R26.89);Difficulty in walking, not elsewhere classified (R26.2) Pain - part of body: (back)    Time: 1638-4665 PT Time Calculation (min) (ACUTE ONLY): 29 min   Charges:     PT Treatments $Therapeutic Activity: 8-22 mins   PT G Codes:        Kenadie Royce B. Migdalia Dk PT, DPT Acute Rehabilitation  [phone] Pager [phone]    [place] 01/10/2018, 4:31 PM

## 2018-01-10 NOTE — Progress Notes (Addendum)
Contacted on call MD in regards to the use of the PICC line that patient was admitted with in his left upper arm.  Iv team had concerns if line was infected due to his admittance with fever and rash.  On-call MD gave verbal permission to use PICC for tonight since cultures are negative and will follow up tomorrow.

## 2018-01-10 NOTE — Progress Notes (Addendum)
Patient ID: Damon Parrish, male   DOB: 10/17/1940, 77 y.o.   MRN: [id number]  PROGRESS NOTE    Damon Parrish  CZY:[id number] DOB: October 08, 1940 DOA: 01/08/2018  PCP: Tobe Sos, MD   Brief Narrative:  77 y.o. male who[facility]ially presented to [facility] with complaints of lower back painlate last month. 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 He was empirically started on vancomycin and ceftriaxone and transferred to [facility] health for further evaluation and treatment. IR was consulted for a bone biopsy. He was continued on vancomycin and ceftriaxone. Apparently the blood and biopsy results were without growth. Infectious disease evaluated the patient and recommended continuing him on vancomycin and ceftriaxone for a total of 8 weeks with the end date being 02/13/2018. The patient had been subsequently discharged home after that with a home PICC line. He was also supposed to have some repeat imaging in about 8 weeks. He had follow-up with infectious disease and orthopedics arrange for outpatient management. Reportedly he had been at home on IV antibiotics for approximately 2 weeks. He then started to develop fevers and generalized rash. His wife gave him an EpiPen dose without response.  In hospital here, pt started on dapto.   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  Objective: Vitals:   01/09/18  1723 01/09/18 2231 01/10/18 0530 01/10/18 0835  BP: 119/77 113/67 119/68 121/67  Pulse: 88 (!) 103 96 94  Resp:      Temp: 98.7 F (37.1 C)  98.4 F (36.9 C) 98.1 F (36.7 C)  TempSrc: Oral  Oral Oral  SpO2: 98% 92% 96%   Weight:      Height:        Intake/Output Summary (Last 24 hours) at 01/10/2018 1059 Last data filed at 01/10/2018 0900 Gross per 24 hour  Intake 286 ml  Output 220 ml  Net 66 ml   Filed Weights   01/08/18 0413  Weight: 72.6 kg (160 lb)    Examination:  General exam: Appears calm and comfortable  Respiratory system: Clear to auscultation. Respiratory effort normal. Cardiovascular system: S1 & S2 heard, RRR.  Gastrointestinal system: Abdomen is nondistended, soft and nontender. No organomegaly or masses felt. Normal bowel sounds heard. Central nervous system: Alert and oriented. No focal neurological deficits. Extremities: Symmetric 5 x 5 power. Skin: scattered skin erythematous rash Psychiatry: Judgement and insight appear normal. Mood & affect appropriate.   Data Reviewed: I have personally reviewed following labs and imaging studies  CBC: Recent Labs  Lab 01/08/18 0542 01/09/18 0855  WBC 6.4 9.2  NEUTROABS 4.8 6.8  HGB 13.4 12.5*  HCT 39.6 36.6*  MCV 94.3 92.7  PLT 215 093   Basic Metabolic Panel: Recent Labs  Lab 01/08/18 0542 01/09/18 0855  NA 127* 127*  K 4.1 3.5  CL 91* 94*  CO2 23 21*  GLUCOSE 127* 150*  BUN 19 21*  CREATININE  0.97 0.71  CALCIUM 8.8* 8.3*   GFR: Estimated Creatinine Clearance: 69.8 mL/min (by C-G formula based on SCr of 0.71 mg/dL). Liver Function Tests: Recent Labs  Lab 01/08/18 0542 01/09/18 0855 01/10/18 0732  AST 202* 247* 147*  ALT 247* 433* 306*  ALKPHOS 135* 103 82  BILITOT 0.6 0.6 0.6  PROT 6.5 5.5* 4.5*  ALBUMIN 3.2* 2.7* 2.2*   No results for input(s): LIPASE, AMYLASE in the last 168 hours. No results for input(s): AMMONIA in the last 168 hours. Coagulation Profile: Recent Labs  Lab  01/10/18 0732  INR 1.46   Cardiac Enzymes: Recent Labs  Lab 01/09/18 1100  CKTOTAL 25*   BNP (last 3 results) No results for input(s): PROBNP in the last 8760 hours. HbA1C: No results for input(s): HGBA1C in the last 72 hours. CBG: No results for input(s): GLUCAP in the last 168 hours. Lipid Profile: No results for input(s): CHOL, HDL, LDLCALC, TRIG, CHOLHDL, LDLDIRECT in the last 72 hours. Thyroid Function Tests: No results for input(s): TSH, T4TOTAL, FREET4, T3FREE, THYROIDAB in the last 72 hours. Anemia Panel: No results for input(s): VITAMINB12, FOLATE, FERRITIN, TIBC, IRON, RETICCTPCT in the last 72 hours. Urine analysis:    Component Value Date/Time   COLORURINE AMBER (A) 01/08/2018 0419   APPEARANCEUR CLOUDY (A) 01/08/2018 0419   LABSPEC 1.027 01/08/2018 0419   PHURINE 6.0 01/08/2018 0419   GLUCOSEU NEGATIVE 01/08/2018 0419   HGBUR NEGATIVE 01/08/2018 0419   BILIRUBINUR NEGATIVE 01/08/2018 0419   KETONESUR 5 (A) 01/08/2018 0419   PROTEINUR 100 (A) 01/08/2018 0419   UROBILINOGEN 0.2 05/04/2015 1043   NITRITE NEGATIVE 01/08/2018 0419   LEUKOCYTESUR NEGATIVE 01/08/2018 0419   Sepsis Labs: @LABRCNTIP (procalcitonin:4,lacticidven:4)   Urine Culture     Status: None   Collection Time: 01/08/18  4:20 AM  Result Value Ref Range Status   Specimen Description   Final    URINE, CLEAN CATCH Performed at [facility], [address] [place], [place] [address]    Special Requests   Final    NONE Performed at [facility], [address] [place], [place] [address]    Culture   Final    NO GROWTH    Report Status 01/09/2018 FINAL  Final  Culture, blood (Routine X 2) w Reflex to ID Panel     Status: None (Preliminary result)   Collection Time: 01/08/18  4:48 AM  Result Value Ref Range Status   Specimen Description BLOOD LEFT ARM  Final   Special Requests   Final    BOTTLES DRAWN AEROBIC AND ANAEROBIC Blood Culture results may not be optimal due to an inadequate  volume of blood received in culture bottles   Culture   Final    NO GROWTH 2 DAYS    Report Status PENDING  Incomplete  Culture, blood (Routine X 2) w Reflex to ID Panel     Status: None (Preliminary result)   Collection Time: 01/08/18  5:41 AM  Result Value Ref Range Status   Specimen Description BLOOD RIGHT ARM  Final   Special Requests   Final    BOTTLES DRAWN AEROBIC AND ANAEROBIC Blood Culture adequate volume   Culture   Final    NO GROWTH 2 DAYS Performed at [facility], [address] [place], [place] [address]    Report Status PENDING  Incomplete      Radiology Studies: Dg Chest Port 1 View Result Date: 01/09/2018 1. Left PICC terminates over the lower SVC. 2. Clear lungs.  Dg Chest Port 1 View Result Date: 01/08/2018 1. No radiographic evidence for active cardiopulmonary disease. 2. Aortic atherosclerosis.   US Abdomen Limited Ruq Result Date: 01/08/2018 Layering sludge and/or small stones within the gallbladder. No wall thickening or evidence of acute cholecystitis.     Scheduled Meds: . amLODipine  10 mg Oral Daily  . aspirin  325 mg Oral Daily  . docusate sodium  100 mg Oral BID  . enoxaparin   40 mg Subcutaneous Q24H   Continuous Infusions: . sodium chloride 50 mL/hr at 01/10/18 0539  . DAPTOmycin (CUBICIN)  IV Stopped (01/09/18 1243)     LOS: 1 day    Time spent: 25 minutes  Greater than 50% of the time spent on counseling and coordinating the care.   Leisa Lenz, MD [facility] Pager [phone]  If 7PM-7AM, please contact night-coverage [email or web address] Password [facility] 01/10/2018, 10:59 AM

## 2018-01-10 NOTE — Progress Notes (Addendum)
Initial Nutrition Assessment  DOCUMENTATION CODES:   Not applicable  INTERVENTION:    Ensure Enlive po BID, each supplement provides 350 kcal and 20 grams of protein  NUTRITION DIAGNOSIS:   Increased nutrient needs related to acute illness as evidenced by estimated needs  GOAL:   Patient will meet greater than or equal to 90% of their needs  MONITOR:   PO intake, Supplement acceptance, Labs, Skin, Weight trends, I & O's  REASON FOR ASSESSMENT:   Malnutrition Screening Tool  ASSESSMENT:   77 y.o. Male who initially presented to [facility] with complaints of lower back pain late last month. 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  He was empirically started on vancomycin and ceftriaxone and transferred to [facility] health for further evaluation and treatment.  Pt sleeping upon RD visit. Spoke with pt's wife at bedside. Wife report's pt's appetite is not doing very well here. PO intake 50% per flowsheet records.  Pt was also experiencing a decreased appetite PTA. He was however drinking at least one (1) Ensure supplement per day. Wife shares pt was hospitalized at [facility] for about 3 weeks.  During acute stay at [facility] pt lost about 15 lbs. Medications include MVI and Colace. Labs reviewed. Na 127 (L).  NUTRITION - FOCUSED PHYSICAL EXAM:  Deferred at this time. Pt laying flat sleeping.  Diet Order:  Diet Heart Room service appropriate? Yes; Fluid consistency: Thin  EDUCATION NEEDS:   Not appropriate for education at this time  Skin:  Skin Assessment: Reviewed RN Assessment  Last BM:  4/18   Intake/Output Summary (Last 24 hours) at 01/10/2018 1451 Last data filed at 01/10/2018 1415 Gross per 24 hour  Intake 250 ml  Output 220 ml  Net 30 ml   Height:   Ht Readings from Last 1 Encounters:  01/08/18 5\' 6"  (1.676 m)   Weight:   Wt Readings  from Last 1 Encounters:  01/08/18 160 lb (72.6 kg)   BMI:  Body mass index is 25.82 kg/m.  Estimated Nutritional Needs:   Kcal:  1700-1900  Protein:  80-95 gm  Fluid:  1.7-1.9 L  Arthur Holms, RD, LDN Pager #: [phone] After-Hours Pager #: [phone]

## 2018-01-11 DIAGNOSIS — M4624 Osteomyelitis of vertebra, thoracic region: Secondary | ICD-10-CM

## 2018-01-11 DIAGNOSIS — R740 Nonspecific elevation of levels of transaminase and lactic acid dehydrogenase [LDH]: Secondary | ICD-10-CM

## 2018-01-11 DIAGNOSIS — D721 Eosinophilia: Secondary | ICD-10-CM

## 2018-01-11 DIAGNOSIS — L270 Generalized skin eruption due to drugs and medicaments taken internally: Principal | ICD-10-CM

## 2018-01-11 DIAGNOSIS — Z881 Allergy status to other antibiotic agents status: Secondary | ICD-10-CM

## 2018-01-11 DIAGNOSIS — G061 Intraspinal abscess and granuloma: Secondary | ICD-10-CM

## 2018-01-11 DIAGNOSIS — M4644 Discitis, unspecified, thoracic region: Secondary | ICD-10-CM

## 2018-01-11 DIAGNOSIS — G9520 Unspecified cord compression: Secondary | ICD-10-CM

## 2018-01-11 DIAGNOSIS — T368X5S Adverse effect of other systemic antibiotics, sequela: Secondary | ICD-10-CM

## 2018-01-11 DIAGNOSIS — F17210 Nicotine dependence, cigarettes, uncomplicated: Secondary | ICD-10-CM

## 2018-01-11 LAB — COMPREHENSIVE METABOLIC PANEL
ALK PHOS: 82 U/L (ref 38–126)
ALT: 277 U/L — ABNORMAL HIGH (ref 17–63)
ANION GAP: 8 (ref 5–15)
AST: 134 U/L — ABNORMAL HIGH (ref 15–41)
Albumin: 2.1 g/dL — ABNORMAL LOW (ref 3.5–5.0)
BILIRUBIN TOTAL: 0.6 mg/dL (ref 0.3–1.2)
BUN: 16 mg/dL (ref 6–20)
CALCIUM: 7.5 mg/dL — AB (ref 8.9–10.3)
CO2: 20 mmol/L — AB (ref 22–32)
Chloride: 97 mmol/L — ABNORMAL LOW (ref 101–111)
Creatinine, Ser: 0.79 mg/dL (ref 0.61–1.24)
Glucose, Bld: 101 mg/dL — ABNORMAL HIGH (ref 65–99)
Potassium: 3.3 mmol/L — ABNORMAL LOW (ref 3.5–5.1)
SODIUM: 125 mmol/L — AB (ref 135–145)
TOTAL PROTEIN: 4.2 g/dL — AB (ref 6.5–8.1)

## 2018-01-11 LAB — CBC WITH DIFFERENTIAL/PLATELET
BASOS ABS: 0 10*3/uL (ref 0.0–0.1)
Basophils Relative: 0 %
EOS ABS: 2.4 10*3/uL — AB (ref 0.0–0.7)
Eosinophils Relative: 32 %
HCT: 32.2 % — ABNORMAL LOW (ref 39.0–52.0)
HEMOGLOBIN: 11.1 g/dL — AB (ref 13.0–17.0)
LYMPHS PCT: 21 %
Lymphs Abs: 1.6 10*3/uL (ref 0.7–4.0)
MCH: 32 pg (ref 26.0–34.0)
MCHC: 34.5 g/dL (ref 30.0–36.0)
MCV: 92.8 fL (ref 78.0–100.0)
Monocytes Absolute: 0.5 10*3/uL (ref 0.1–1.0)
Monocytes Relative: 7 %
NEUTROS PCT: 40 %
Neutro Abs: 3.1 10*3/uL (ref 1.7–7.7)
PLATELETS: 272 10*3/uL (ref 150–400)
RBC: 3.47 MIL/uL — AB (ref 4.22–5.81)
RDW: 14 % (ref 11.5–15.5)
WBC: 7.6 10*3/uL (ref 4.0–10.5)

## 2018-01-11 LAB — PROTIME-INR
INR: 1.45
PROTHROMBIN TIME: 17.5 s — AB (ref 11.4–15.2)

## 2018-01-11 MED ORDER — POTASSIUM CHLORIDE CRYS ER 20 MEQ PO TBCR
40.0000 meq | EXTENDED_RELEASE_TABLET | Freq: Once | ORAL | Status: AC
  Administered 2018-01-11: 40 meq via ORAL
  Filled 2018-01-11: qty 2

## 2018-01-11 MED ORDER — DIPHENHYDRAMINE HCL 25 MG PO CAPS
25.0000 mg | ORAL_CAPSULE | Freq: Four times a day (QID) | ORAL | Status: DC | PRN
  Administered 2018-01-11: 25 mg via ORAL
  Filled 2018-01-11: qty 1

## 2018-01-11 MED ORDER — PREDNISONE 20 MG PO TABS
40.0000 mg | ORAL_TABLET | Freq: Every day | ORAL | Status: DC
  Administered 2018-01-11 – 2018-01-17 (×7): 40 mg via ORAL
  Filled 2018-01-11 (×7): qty 2

## 2018-01-11 MED ORDER — HYDROXYZINE HCL 25 MG PO TABS
25.0000 mg | ORAL_TABLET | Freq: Four times a day (QID) | ORAL | Status: DC | PRN
  Administered 2018-01-13 – 2018-01-14 (×2): 25 mg via ORAL
  Filled 2018-01-11 (×2): qty 1

## 2018-01-11 NOTE — Evaluation (Signed)
Occupational Therapy Evaluation Patient Details Name: Damon Parrish MRN: [id number] DOB: 1941-02-06 Today's Date: 01/11/2018    History of Present Illness 77 y.o. Male who initially presented to [facility] with complaints of lower back pain late last month. 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  He was empirically started on vancomycin and ceftriaxone and transferred to [facility] health for further evaluation and treatment. Reportedly he had been at home on IV antibiotics for approximately 2 weeks.  He then started to develop fevers and generalized rash. His wife gave him an EpiPen dose without response.    Clinical Impression   PTA Pt was using SPC for mobility and the past 2 weeks Pt has needed assist from wife for LB ADL - prior to that was independent. Pt is currently max A for LB ADL, min A for bed mobility and min A for sit <>stand transfers. Pt educated on back precautions (handout provided) as a way to help with pain management. Per Pt and wife neurosx involved and they will decide about sx early this week (mon or tues) and so OT will watch for progression and adapt recommendations as necessary. Next session to focus on into to AE for LB ADL.     Follow Up Recommendations  Other (comment);Home health OT;Supervision/Assistance - 24 hour(watch after sx recommendations from)    Equipment Recommendations  3 in 1 bedside commode    Recommendations for Other Services       Precautions / Restrictions Precautions Precaution Comments: Educated on Back precautions and provided handout Restrictions Weight Bearing Restrictions: No      Mobility Bed Mobility Overal bed mobility: Needs Assistance Bed Mobility: Rolling;Sidelying to Sit;Sit to Sidelying Rolling: Min guard(cues for sequencing/to use knees to assist; use of rail) Sidelying to sit: Min assist(cues for squencing, min A for  trunk elevation)     Sit to sidelying: Min assist(for BLE back in bed) General bed mobility comments: educated on back precautions and sequencing for decreased pressure on back and twisting - Pt able to perform with min A this session  Transfers Overall transfer level: Needs assistance Equipment used: Rolling walker (2 wheeled) Transfers: Sit to/from Stand Sit to Stand: Min guard         General transfer comment: very antalgic with movement however requires only min guard for safety with power up and steadying    Balance Overall balance assessment: Needs assistance Sitting-balance support: No upper extremity supported;Feet supported Sitting balance-Leahy Scale: Fair Sitting balance - Comments: fatigues sitting EOB Postural control: Posterior lean Standing balance support: Bilateral upper extremity supported Standing balance-Leahy Scale: Fair Standing balance comment: benefits from RW for balance                           ADL either performed or assessed with clinical judgement   ADL Overall ADL's : Needs assistance/impaired Eating/Feeding: Modified independent Eating/Feeding Details (indicate cue type and reason): Pt is able to bring hand to mouth Grooming: Set up;Bed level Grooming Details (indicate cue type and reason): Pt has ROM and grasp to perform grooming tasks, limited by pain in sitting position Upper Body Bathing: Moderate assistance;With caregiver independent assisting Upper Body Bathing Details (indicate cue type and reason): assist for back - performed in sitting  Lower Body Bathing: Maximal assistance;Sitting/lateral leans   Upper Body Dressing : Minimal assistance   Lower Body Dressing: Maximal assistance  Toilet Transfer: Min Control and instrumentation engineer and Hygiene: Moderate assistance       Functional mobility during ADLs: Min guard;Rolling walker(sit <>stand only this session) General ADL Comments: wife is  willing and able to assist with LB ADL      Vision Baseline Vision/History: Wears glasses Wears Glasses: At all times Patient Visual Report: No change from baseline       Perception     Praxis      Pertinent Vitals/Pain Pain Assessment: 0-10 Pain Score: 5  Pain Location: back - but bigger complaint is that he's itchy everywhere Pain Descriptors / Indicators: Aching;Constant;Other (Comment)(Itchy) Pain Intervention(s): Limited activity within patient's tolerance;Repositioned;Monitored during session     Hand Dominance Right(eats with Left)   Extremity/Trunk Assessment Upper Extremity Assessment Upper Extremity Assessment: Generalized weakness   Lower Extremity Assessment Lower Extremity Assessment: Generalized weakness   Cervical / Trunk Assessment Cervical / Trunk Assessment: Other exceptions(T9-T10 osteomyelitis )   Communication Communication Communication: No difficulties   Cognition Arousal/Alertness: Awake/alert Behavior During Therapy: WFL for tasks assessed/performed Overall Cognitive Status: Within Functional Limits for tasks assessed                                     General Comments  Pt's wife present throughout session    Exercises     Shoulder Instructions      Home Living Family/patient expects to be discharged to:: Private residence Living Arrangements: Spouse/significant other Available Help at Discharge: Family;Available 24 hours/day Type of Home: House Home Access: Stairs to enter [employer] of Steps: 1   Home Layout: One level     Bathroom Shower/Tub: Occupational psychologist: Standard     Home Equipment: Environmental consultant - 2 wheels;Cane - single point;Shower seat - built in;Grab bars - tub/shower;Wheelchair - manual   Additional Comments: retired from working for the newspaper in [place]: Needs assistance    ADL's / [employer] Assistance  Needed: dressing, bathing and eating when on his back    Comments: required help the past 2 weeks        OT Problem List: Decreased range of motion;Decreased strength;Decreased activity tolerance;Impaired balance (sitting and/or standing);Decreased knowledge of use of DME or AE;Decreased knowledge of precautions;Pain      OT Treatment/Interventions: Self-care/ADL training;DME and/or AE instruction;Therapeutic activities;Patient/family education;Balance training    OT Goals(Current goals can be found in the care plan section) Acute Rehab OT Goals Patient Stated Goal: have less pain OT Goal Formulation: With patient/family Time For Goal Achievement: 01/25/18 Potential to Achieve Goals: Good ADL Goals Pt Will Perform Lower Body Bathing: with modified independence;with caregiver independent in assisting;with adaptive equipment;sit to/from stand Pt Will Perform Lower Body Dressing: with modified independence;with adaptive equipment;sitting/lateral leans Pt Will Transfer to Toilet: with supervision;ambulating Pt Will Perform Toileting - Clothing Manipulation and hygiene: with modified independence;with adaptive equipment;sit to/from stand Additional ADL Goal #1: Pt will perform bed mobility with back precaution method at mod I level as precusor to participation in ADL activity  OT Frequency: Min 2X/week   Barriers to D/C:            Co-evaluation              AM-PAC PT "6 Clicks" Daily Activity     Outcome Measure Help from another person eating meals?: None Help from another person taking  care of personal grooming?: A Little Help from another person toileting, which includes using toliet, bedpan, or urinal?: A Little Help from another person bathing (including washing, rinsing, drying)?: A Lot Help from another person to put on and taking off regular upper body clothing?: A Lot Help from another person to put on and taking off regular lower body clothing?: A Lot 6 Click Score:  16   End of Session Nurse Communication: Mobility status  Activity Tolerance: Patient limited by pain Patient left: in bed;with call bell/phone within reach;with family/visitor present  OT Visit Diagnosis: Unsteadiness on feet (R26.81);Other abnormalities of gait and mobility (R26.89);Muscle weakness (generalized) (M62.81)                Time: 4098-1191 OT Time Calculation (min): 29 min Charges:  OT General Charges $OT Visit: 1 Visit OT Evaluation $OT Eval Moderate Complexity: 1 Mod OT Treatments $Self Care/Home Management : 8-22 mins G-Codes:     Hulda Humphrey OTR/L [place] 01/11/2018, 11:55 AM

## 2018-01-11 NOTE — Consult Note (Signed)
[facility] for Infectious Disease  Total days of antibiotics 3 (total abtx day 25)        Day 3 daptomycin               Reason for Consult:drug allergy rash    Referring Physician: devine  Principal Problem:   Adverse drug reaction Active Problems:   Discitis   Epidural abscess   Osteomyelitis of thoracic spine (HCC)   Dermatitis, drug-induced   Pressure injury of skin    HPI: Damon Parrish is a 77 y.o. male whoinitially was admitted on 3/27 for evaluation of lower back painin setting of fatigue, progressive weakness, and constipation. Work up found that he had T9-10 discitis/osteo with epidural abscess and cord compression. He was initially started on vancomycin and ceftriaxone and transferred to [facility] for further neurosurgical management. At their facility, Neurosurgery did not feel he needed surgical debridement, he underwent bone biopsy and blood cx which were NGTD, and discharged on 8 wk of vancomycin plus ceftriaxone til 02/13/2018. He followed up on 4/8 to [facility] ID clinic and was tolerating abtx without difficulty. In this past week, he started to have a pruritic, raised red rash over the face, neck and upper body roughly 2 days prior to admit. His rash is erythematous morbiliform coalescing macules, no blanching started at face,chest, arms and now trending to his feet , no mucosal involvement. no tongue swelling, throat swelling or difficulty breathing. He is having some erythroderma to face/ears- slightly swollen per his wife.He was not having any symptoms of pruritus as well as temperature of 100.6 and he was told to come to the emergency room for further evaluation. The patient has continued to have pain in his back c/w presentation in early April. He was admitted for evaluation of drug rash. His vancomycin and ceftriaxone were discontinued since it is unclear which abtx caused his rash. He was changed to daptomycin. On admit, his WBC was significant for 32% eosinophilia.  TEC of 2.4K. His labs also significant for transaminitis with AST/ALT in 200-400s now improved to 130-200s but normal t.bili. No AKI. cxr per my read does not show in any infiltrates. He remains afebrile. Rash progressing.   Past Medical History:  Diagnosis Date  . Arthritis    "generalized" (01/09/2018)  . Chronic lower back pain    "last couple months" (01/09/2018)  . Chronic sinus complaints   . 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 GERD (gastroesophageal reflux disease)   . H/O cerebral aneurysm repair 2016   "put stent in"  . High cholesterol   . History of blood transfusion ~ 1950   "while in hospital w/pneumonia" (01/09/2018)  . History of kidney stones   . Hypertension   . Nonruptured cerebral aneurysm   . Pneumonia ~ 1950  . Seizures ([place]) 12/27/2015; 12/28/2015  . Stroke ([place]) 12/2015   denies residual on 01/09/2018)    Allergies:  Allergies  Allergen Reactions  . Cephalosporins Rash  . Vancomycin Rash    MEDICATIONS: . amLODipine  10 mg Oral Daily  . aspirin  325 mg Oral Daily  . brinzolamide  1 drop Both Eyes BID   And  . brimonidine  1 drop Both Eyes BID  . docusate sodium  100 mg Oral BID  . enoxaparin (LOVENOX) injection  40 mg Subcutaneous Q24H  . feeding supplement (ENSURE ENLIVE)  237 mL Oral BID BM  . fluticasone  1 spray Each Nare Daily  . latanoprost  1 drop Both  Eyes QHS  . loratadine  10 mg Oral Daily  . multivitamin with minerals  1 tablet Oral Daily  . multivitamin-lutein  1 capsule Oral Daily  . sodium chloride flush  10-40 mL Intracatheter Q12H    Social History   Tobacco Use  . Smoking status: Current Every Day Smoker    Packs/day: 0.50    Years: 61.00    Pack years: 30.50    Types: Cigarettes  . Smokeless tobacco: Never Used  Substance Use Topics  . Alcohol use: No    Alcohol/week: 0.0 oz  . Drug use: No    Family History  Problem Relation Age of Onset  . Lymphoma Mother   . Dementia Mother   . Aneurysm Father   . Colon cancer Neg Hx      Review of Systems -   Constitutional: + intermittent chills. Negative for fever, chills, diaphoresis, activity change, appetite change, fatigue and unexpected weight change.  HENT: Negative for congestion, sore throat, rhinorrhea, sneezing, trouble swallowing and sinus pressure.  Eyes: Negative for photophobia and visual disturbance.  Respiratory: Negative for cough, chest tightness, shortness of breath, wheezing and stridor.  Cardiovascular: Negative for chest pain, palpitations and leg swelling.  Gastrointestinal: Negative for nausea, vomiting, abdominal pain, diarrhea, constipation, blood in stool, abdominal distention and anal bleeding.  Genitourinary: Negative for dysuria, hematuria, flank pain and difficulty urinating.  Musculoskeletal: + back pain.  Negative for myalgias,  joint swelling, arthralgias and gait problem.  Skin: + pruritic rash Neurological: Negative for dizziness, tremors, weakness and light-headedness.  Hematological: Negative for adenopathy. Does not bruise/bleed easily.  Psychiatric/Behavioral: Negative for behavioral problems, confusion, sleep disturbance, dysphoric mood, decreased concentration and agitation.     OBJECTIVE: Temp:  [98.3 F (36.8 C)] 98.3 F (36.8 C) (04/21 0509) Pulse Rate:  [85-89] 85 (04/21 0509) Resp:  [20] 20 (04/20 1414) BP: (107-131)/(67-71) 131/68 (04/21 0509) SpO2:  [97 %-100 %] 97 % (04/21 0509) Physical Exam  Constitutional: He is oriented to person, place, and time. He appears well-developed and well-nourished. Lying supine in mild distress.  HENT: marked erythematous face, slightly swollen, dry lips and swollen ears  Mouth/Throat: Oropharynx is clear and moist. No oropharyngeal exudate. No buccal mucosal or conjunctiva erythema and disruption Cardiovascular: Normal rate, regular rhythm and normal heart sounds. Exam reveals no gallop and no friction rub.  No murmur heard.  Pulmonary/Chest: Effort normal and breath sounds  normal. No respiratory distress. He has no wheezes.  Abdominal: Soft. Bowel sounds are normal. He exhibits no distension. There is no tenderness.  Lymphadenopathy:  He has no cervical adenopathy.  gu = no dysurption of skin to penis/tip of penis Neurological: He is alert and oriented to person, place, and time.  Skin: diffuse morbilliform erythematous rash to face,chest,arms, torso, groin, with islands of sparing white skin patches. Non blanching. Legs are 50% involved. Psychiatric: He has a normal mood and affect. His behavior is normal.     LABS: Results for orders placed or performed during the hospital encounter of 01/08/18 (from the past 48 hour(s))  Hepatic function panel     Status: Abnormal   Collection Time: 01/10/18  7:32 AM  Result Value Ref Range   Total Protein 4.5 (L) 6.5 - 8.1 g/dL   Albumin 2.2 (L) 3.5 - 5.0 g/dL   AST 147 (H) 15 - 41 U/L   ALT 306 (H) 17 - 63 U/L   Alkaline Phosphatase 82 38 - 126 U/L   Total Bilirubin 0.6 0.3 -  1.2 mg/dL   Bilirubin, Direct 0.1 0.1 - 0.5 mg/dL   Indirect Bilirubin 0.5 0.3 - 0.9 mg/dL    Comment: Performed at [facility] [address] [place], [place] [address]  Protime-INR     Status: Abnormal   Collection Time: 01/10/18  7:32 AM  Result Value Ref Range   Prothrombin Time 17.6 (H) 11.4 - 15.2 seconds   INR 1.46     Comment: Performed at [facility] [address] [place], [name] [address]  Comprehensive metabolic panel     Status: Abnormal   Collection Time: 01/11/18  4:31 AM  Result Value Ref Range   Sodium 125 (L) 135 - 145 mmol/L   Potassium 3.3 (L) 3.5 - 5.1 mmol/L   Chloride 97 (L) 101 - 111 mmol/L   CO2 20 (L) 22 - 32 mmol/L   Glucose, Bld 101 (H) 65 - 99 mg/dL   BUN 16 6 - 20 mg/dL   Creatinine, Ser 0.79 0.61 - 1.24 mg/dL   Calcium 7.5 (L) 8.9 - 10.3 mg/dL   Total Protein 4.2 (L) 6.5 - 8.1 g/dL   Albumin 2.1 (L) 3.5 - 5.0 g/dL   AST 134 (H) 15 - 41 U/L   ALT 277 (H) 17 - 63 U/L   Alkaline  Phosphatase 82 38 - 126 U/L   Total Bilirubin 0.6 0.3 - 1.2 mg/dL   GFR calc non Af Amer >60 >60 mL/min   GFR calc Af Amer >60 >60 mL/min    Comment: (NOTE) The eGFR has been calculated using the CKD EPI equation. This calculation has not been validated in all clinical situations. eGFR's persistently <60 mL/min signify possible Chronic Kidney Disease.    Anion gap 8 5 - 15    Comment: Performed at [facility] [address], [place], [place] [address]  CBC WITH DIFFERENTIAL     Status: Abnormal   Collection Time: 01/11/18  4:31 AM  Result Value Ref Range   WBC 7.6 4.0 - 10.5 K/uL   RBC 3.47 (L) 4.22 - 5.81 MIL/uL   Hemoglobin 11.1 (L) 13.0 - 17.0 g/dL   HCT 32.2 (L) 39.0 - 52.0 %   MCV 92.8 78.0 - 100.0 fL   MCH 32.0 26.0 - 34.0 pg   MCHC 34.5 30.0 - 36.0 g/dL   RDW 14.0 11.5 - 15.5 %   Platelets 272 150 - 400 K/uL   Neutrophils Relative % 40 %   Lymphocytes Relative 21 %   Monocytes Relative 7 %   Eosinophils Relative 32 %   Basophils Relative 0 %   Neutro Abs 3.1 1.7 - 7.7 K/uL   Lymphs Abs 1.6 0.7 - 4.0 K/uL   Monocytes Absolute 0.5 0.1 - 1.0 K/uL   Eosinophils Absolute 2.4 (H) 0.0 - 0.7 K/uL   Basophils Absolute 0.0 0.0 - 0.1 K/uL   RBC Morphology ELLIPTOCYTES     Comment: Performed at [facility], [place] [address], [place], [place] [address]  Protime-INR     Status: Abnormal   Collection Time: 01/11/18  4:31 AM  Result Value Ref Range   Prothrombin Time 17.5 (H) 11.4 - 15.2 seconds   INR 1.45     Comment: Performed at [facility] [address], [place], [place] [address]    MICRO: 4/18 blood cx ngtd 4/18 urine cx ngtd IMAGING: Dg Chest Port 1 View  Result Date: 01/09/2018 CLINICAL DATA:  PICC in place. EXAM:  PORTABLE CHEST 1 VIEW COMPARISON:  01/08/2018 FINDINGS: A left PICC remains in place and terminates over the lower SVC. The cardiac silhouette is within normal limits. Thoracic aortic tortuosity and atherosclerosis are noted. The lungs  are hyperinflated. No airspace consolidation, edema, sizeable pleural effusion, or pneumothorax is identified. No acute osseous abnormality is seen. IMPRESSION: 1. Left PICC terminates over the lower SVC. 2. Clear lungs. Electronically Signed   By: [place] Bores M.D.   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 He is on daptomycin currently. DRESS more likely caused by vancomycin than cephalosporins but too difficult to tell since given at the same time.   Drug rash = will take up to 2 wk to clear. Continue with supportive care. Will start hydroxyzine instead of benadryl to help with itching- less sedating. Will start on prednisone 43m daily to see if that will help some of the swelling, and progression of process - with plan to taper once we see a response.  Eosinophilia = consistent with response to drug rash/DRESS syndrome. Likely to improve since offending agents have been discontinued. Continue to check CBC with differential.   transaminitis = improving, likely due to DRESS syndroem rather than just cephalosporin since we would also have increased ALP with cephalosporins. - represents the systemic symptoms portion of DRESS.  Discitis = plan to treat with daptomycin at 829mkg daily. Check weekly ck. Await closer to discharge to decide if also need to add levofloxacin. For now ,would hold off on introducing any further new agents. Agree with getting NSGY to evaluate imaging to see if anything further needs to be done besides medical management  Dr VaTommy Medalo see tomorrow.

## 2018-01-11 NOTE — Progress Notes (Signed)
Patient ID: Damon Parrish, male   DOB: 1940-12-31, 77 y.o.   MRN: [id number]  PROGRESS NOTE    Damon Parrish  WUJ:[id number] DOB: 1941/02/04 DOA: 01/08/2018  PCP: Tobe Sos, MD   Brief Narrative:  77 y.o. male whoinitially presented to [facility] with complaints of lower back painlate last month. 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 He was empirically started on vancomycin and ceftriaxone and transferred to [facility] health for further evaluation and treatment. IR was consulted for a bone biopsy. He was continued on vancomycin and ceftriaxone. Apparently the blood and biopsy results were without growth. Infectious disease evaluated the patient and recommended continuing him on vancomycin and ceftriaxone for a total of 8 weeks with the end date being 02/13/2018. The patient had been subsequently discharged home after that with a home PICC line. He was also supposed to have some repeat imaging in about 8 weeks. He had follow-up with infectious disease and orthopedics arrange for outpatient management. Reportedly he had been at home on IV antibiotics for approximately 2 weeks. He then started to develop fevers and generalized rash. His wife gave him an EpiPen dose without response. Started on dapto.   Assessment & Plan:   Principal Problem:   Adverse drug reaction - Due to vanco - Pt currently on daptomycin  - Appreciate ID input   Active Problems:   Epidural abscess / Osteomyelitis of thoracic spine (HCC)  - Continue dapto    DVT prophylaxis: Lovenox subQ Code Status: DNR/DNI Family Communication: wife at bedside  Disposition Plan: home once rash improves    Consultants:   ID  Procedures:   None   Antimicrobials:   Daptomycin 4/18 -->   Subjective: No overnight events.  Objective: Vitals:   01/10/18 0835 01/10/18 1414 01/10/18 1927 01/11/18 0509    BP: 121/67 107/67 117/71 131/68  Pulse: 94 85 89 85  Resp:  20    Temp: 98.1 F (36.7 C) 98.3 F (36.8 C)  98.3 F (36.8 C)  TempSrc: Oral Oral  Oral  SpO2:  100% 98% 97%  Weight:      Height:        Intake/Output Summary (Last 24 hours) at 01/11/2018 1152 Last data filed at 01/10/2018 1415 Gross per 24 hour  Intake 240 ml  Output -  Net 240 ml   Filed Weights   01/08/18 0413  Weight: 72.6 kg (160 lb)    Physical Exam  Constitutional: Appears well-developed and well-nourished. No distress.  CVS: RRR, S1/S2 + Pulmonary: Effort and breath sounds normal, no stridor, rhonchi, wheezes, rales.  Abdominal: Soft. BS +,  no distension, tenderness, rebound or guarding.  Musculoskeletal: Normal range of motion. No edema and no tenderness.  Lymphadenopathy: No lymphadenopathy noted, cervical, inguinal. Neuro: Alert. Normal reflexes, muscle tone coordination. No cranial nerve deficit. Skin: generalized rash (+) Psychiatric: Normal mood and affect. Behavior, judgment, thought content normal.      Data Reviewed: I have personally reviewed following labs and imaging studies  CBC: Recent Labs  Lab 01/08/18 0542 01/09/18 0855 01/11/18 0431  WBC 6.4 9.2 7.6  NEUTROABS 4.8 6.8 3.1  HGB 13.4 12.5* 11.1*  HCT 39.6 36.6* 32.2*  MCV 94.3 92.7 92.8  PLT 215 228 956   Basic Metabolic Panel: Recent Labs  Lab 01/08/18 0542 01/09/18 0855 01/11/18 0431  NA 127* 127* 125*  K 4.1 3.5 3.3*  CL 91* 94* 97*  CO2 23 21* 20*  GLUCOSE 127* 150* 101*  BUN 19 21* 16  CREATININE 0.97 0.71 0.79  CALCIUM 8.8* 8.3* 7.5*   GFR: Estimated Creatinine Clearance: 69.8 mL/min (by C-G formula based on SCr of 0.79 mg/dL). Liver Function Tests: Recent Labs  Lab 01/08/18 0542 01/09/18 0855 01/10/18 0732 01/11/18 0431  AST 202* 247* 147* 134*  ALT 247* 433* 306* 277*  ALKPHOS 135* 103 82 82  BILITOT 0.6 0.6 0.6 0.6  PROT 6.5 5.5* 4.5* 4.2*  ALBUMIN 3.2* 2.7* 2.2* 2.1*   No results for  input(s): LIPASE, AMYLASE in the last 168 hours. No results for input(s): AMMONIA in the last 168 hours. Coagulation Profile: Recent Labs  Lab 01/10/18 0732 01/11/18 0431  INR 1.46 1.45   Cardiac Enzymes: Recent Labs  Lab 01/09/18 1100  CKTOTAL 25*   BNP (last 3 results) No results for input(s): PROBNP in the last 8760 hours. HbA1C: No results for input(s): HGBA1C in the last 72 hours. CBG: No results for input(s): GLUCAP in the last 168 hours. Lipid Profile: No results for input(s): CHOL, HDL, LDLCALC, TRIG, CHOLHDL, LDLDIRECT in the last 72 hours. Thyroid Function Tests: No results for input(s): TSH, T4TOTAL, FREET4, T3FREE, THYROIDAB in the last 72 hours. Anemia Panel: No results for input(s): VITAMINB12, FOLATE, FERRITIN, TIBC, IRON, RETICCTPCT in the last 72 hours. Urine analysis:    Component Value Date/Time   COLORURINE AMBER (A) 01/08/2018 0419   APPEARANCEUR CLOUDY (A) 01/08/2018 0419   LABSPEC 1.027 01/08/2018 0419   PHURINE 6.0 01/08/2018 0419   GLUCOSEU NEGATIVE 01/08/2018 0419   HGBUR NEGATIVE 01/08/2018 0419   BILIRUBINUR NEGATIVE 01/08/2018 0419   KETONESUR 5 (A) 01/08/2018 0419   PROTEINUR 100 (A) 01/08/2018 0419   UROBILINOGEN 0.2 05/04/2015 1043   NITRITE NEGATIVE 01/08/2018 0419   LEUKOCYTESUR NEGATIVE 01/08/2018 0419   Sepsis Labs: @LABRCNTIP (procalcitonin:4,lacticidven:4)   Urine Culture     Status: None   Collection Time: 01/08/18  4:20 AM  Result Value Ref Range Status   Specimen Description   Final    URINE, CLEAN CATCH Performed at [facility], [address] [place], [place] [address]    Special Requests   Final    NONE Performed at [facility], [address], [place], [place] [address]    Culture   Final    NO GROWTH    Report Status 01/09/2018 FINAL  Final  Culture, blood (Routine X 2) w Reflex to ID Panel     Status: None (Preliminary result)   Collection Time: 01/08/18  4:48 AM  Result Value Ref Range Status   Specimen  Description BLOOD LEFT ARM  Final   Special Requests   Final    BOTTLES DRAWN AEROBIC AND ANAEROBIC Blood Culture results may not be optimal due to an inadequate volume of blood received in culture bottles   Culture   Final    NO GROWTH 2 DAYS    Report Status PENDING  Incomplete  Culture, blood (Routine X 2) w Reflex to ID Panel     Status: None (Preliminary result)   Collection Time: 01/08/18  5:41 AM  Result Value Ref Range Status   Specimen Description BLOOD RIGHT ARM  Final   Special Requests   Final    BOTTLES DRAWN AEROBIC AND ANAEROBIC Blood Culture adequate volume   Culture   Final    NO GROWTH 2 DAYS Performed at [facility], [address] [place],  [address]    Report Status  PENDING  Incomplete      Radiology Studies: Dg Chest Port 1 View Result Date: 01/09/2018 1. Left PICC terminates over the lower SVC. 2. Clear lungs.  Dg Chest Port 1 View Result Date: 01/08/2018 1. No radiographic evidence for active cardiopulmonary disease. 2. Aortic atherosclerosis.   US Abdomen Limited Ruq Result Date: 01/08/2018 Layering sludge and/or small stones within the gallbladder. No wall thickening or evidence of acute cholecystitis.     Scheduled Meds: . amLODipine  10 mg Oral Daily  . aspirin  325 mg Oral Daily  . docusate sodium  100 mg Oral BID  . enoxaparin   40 mg Subcutaneous Q24H   Continuous Infusions: . DAPTOmycin (CUBICIN)  IV Stopped (01/10/18 1405)     LOS: 2 days    Time spent: 25 minutes  Greater than 50% of the time spent on counseling and coordinating the care.   Leisa Lenz, MD [facility] Pager [phone]  If 7PM-7AM, please contact night-coverage [email or web address] Password [facility] 01/11/2018, 11:52 AM

## 2018-01-12 DIAGNOSIS — L270 Generalized skin eruption due to drugs and medicaments taken internally: Secondary | ICD-10-CM

## 2018-01-12 DIAGNOSIS — T7840XA Allergy, unspecified, initial encounter: Secondary | ICD-10-CM

## 2018-01-12 DIAGNOSIS — T50905S Adverse effect of unspecified drugs, medicaments and biological substances, sequela: Secondary | ICD-10-CM

## 2018-01-12 DIAGNOSIS — T50905A Adverse effect of unspecified drugs, medicaments and biological substances, initial encounter: Secondary | ICD-10-CM

## 2018-01-12 DIAGNOSIS — R21 Rash and other nonspecific skin eruption: Secondary | ICD-10-CM

## 2018-01-12 DIAGNOSIS — D7212 Drug rash with eosinophilia and systemic symptoms syndrome: Secondary | ICD-10-CM

## 2018-01-12 DIAGNOSIS — D721 Eosinophilia: Secondary | ICD-10-CM

## 2018-01-12 LAB — COMPREHENSIVE METABOLIC PANEL
ALK PHOS: 102 U/L (ref 38–126)
ALT: 233 U/L — AB (ref 17–63)
AST: 94 U/L — ABNORMAL HIGH (ref 15–41)
Albumin: 2.1 g/dL — ABNORMAL LOW (ref 3.5–5.0)
Anion gap: 7 (ref 5–15)
BUN: 15 mg/dL (ref 6–20)
CO2: 21 mmol/L — AB (ref 22–32)
CREATININE: 0.67 mg/dL (ref 0.61–1.24)
Calcium: 7.7 mg/dL — ABNORMAL LOW (ref 8.9–10.3)
Chloride: 97 mmol/L — ABNORMAL LOW (ref 101–111)
Glucose, Bld: 149 mg/dL — ABNORMAL HIGH (ref 65–99)
Potassium: 4.1 mmol/L (ref 3.5–5.1)
Sodium: 125 mmol/L — ABNORMAL LOW (ref 135–145)
Total Bilirubin: 0.4 mg/dL (ref 0.3–1.2)
Total Protein: 4.1 g/dL — ABNORMAL LOW (ref 6.5–8.1)

## 2018-01-12 LAB — CBC WITH DIFFERENTIAL/PLATELET
BASOS PCT: 2 %
Basophils Absolute: 0.1 10*3/uL (ref 0.0–0.1)
Eosinophils Absolute: 0.2 10*3/uL (ref 0.0–0.7)
Eosinophils Relative: 3 %
HCT: 28.7 % — ABNORMAL LOW (ref 39.0–52.0)
Hemoglobin: 10.1 g/dL — ABNORMAL LOW (ref 13.0–17.0)
LYMPHS ABS: 2 10*3/uL (ref 0.7–4.0)
Lymphocytes Relative: 35 %
MCH: 32.4 pg (ref 26.0–34.0)
MCHC: 35.2 g/dL (ref 30.0–36.0)
MCV: 92 fL (ref 78.0–100.0)
MONO ABS: 0.6 10*3/uL (ref 0.1–1.0)
Monocytes Relative: 10 %
NEUTROS PCT: 50 %
Neutro Abs: 2.8 10*3/uL (ref 1.7–7.7)
PLATELETS: 259 10*3/uL (ref 150–400)
RBC: 3.12 MIL/uL — ABNORMAL LOW (ref 4.22–5.81)
RDW: 14.3 % (ref 11.5–15.5)
WBC: 5.7 10*3/uL (ref 4.0–10.5)

## 2018-01-12 LAB — PROTIME-INR
INR: 1.44
PROTHROMBIN TIME: 17.4 s — AB (ref 11.4–15.2)

## 2018-01-12 NOTE — Plan of Care (Signed)
  Problem: Clinical Measurements: Goal: Ability to maintain clinical measurements within normal limits will improve Outcome: Progressing   Problem: Nutrition: Goal: Adequate nutrition will be maintained Outcome: Progressing   

## 2018-01-12 NOTE — Progress Notes (Signed)
[facility] for Infectious Disease  Date of Admission:  01/08/2018   .  Total days of antibiotics    Patient ID: Damon Parrish is a 77 y.o. male with  Principal Problem:   Adverse drug reaction Active Problems:   Discitis   Epidural abscess   Osteomyelitis of thoracic spine (HCC)   Dermatitis, drug-induced   Pressure injury of skin   . amLODipine  10 mg Oral Daily  . aspirin  325 mg Oral Daily  . brinzolamide  1 drop Both Eyes BID   And  . brimonidine  1 drop Both Eyes BID  . docusate sodium  100 mg Oral BID  . enoxaparin (LOVENOX) injection  40 mg Subcutaneous Q24H  . feeding supplement (ENSURE ENLIVE)  237 mL Oral BID BM  . fluticasone  1 spray Each Nare Daily  . latanoprost  1 drop Both Eyes QHS  . loratadine  10 mg Oral Daily  . multivitamin with minerals  1 tablet Oral Daily  . multivitamin-lutein  1 capsule Oral Daily  . predniSONE  40 mg Oral Q breakfast  . sodium chloride flush  10-40 mL Intracatheter Q12H    SUBJECTIVE: Patient asleep, snoring in bed. His wife Damon Parrish is at the bedside and informs me she would like to transfer his care to our clinic here in [place] as [facility] is 2 hour drive for her. She feels his rash is improving a little - stomach is less swollen today and not "as beet red."    Allergies  Allergen Reactions  . Cephalosporins Rash  . Vancomycin Rash    OBJECTIVE: Vitals:   01/11/18 0509 01/11/18 1300 01/11/18 1948 01/12/18 0417  BP: 131/68 109/77 130/76 103/68  Pulse: 85 82 98 76  Resp:      Temp: 98.3 F (36.8 C) 97.7 F (36.5 C) 98.1 F (36.7 C) (!) 97.5 F (36.4 C)  TempSrc: Oral Oral Oral Oral  SpO2: 97% 98% 98% 96%  Weight:      Height:       Body mass index is 25.82 kg/m.  Physical Exam  Constitutional: He is oriented to person, place, and time. He appears well-developed and well-nourished.  Resting in bed quietly.   HENT:  Mouth/Throat: No oropharyngeal exudate.  Some flakes on top lip but mucous  membranes generally intact.   Eyes: Pupils are equal, round, and reactive to light. No scleral icterus.  Cardiovascular: Normal rate, regular rhythm, normal heart sounds and intact distal pulses.  No murmur heard. Pulmonary/Chest: Effort normal and breath sounds normal. No respiratory distress. He has no wheezes.  Abdominal: Soft. Bowel sounds are normal. He exhibits distension.  Musculoskeletal: Normal range of motion. He exhibits no edema.  Lymphadenopathy:    He has no cervical adenopathy.  Neurological: He is alert and oriented to person, place, and time.  Skin: Skin is warm and dry. Rash noted. There is erythema.  Generalized erythematous rash noted knees to head. Multiple areas of flaking skin noted to bilateral ears. Below the knees darker more vasculitis appearance to rash - this is all flat and not palpable at all. Non-blanching.   Nursing note and vitals reviewed.  Lower extremities:      Lab Results Lab Results  Component Value Date   WBC 5.7 01/12/2018   HGB 10.1 (L) 01/12/2018   HCT 28.7 (L) 01/12/2018   MCV 92.0 01/12/2018   PLT 259 01/12/2018    Lab Results  Component  Value Date   CREATININE 0.67 01/12/2018   BUN 15 01/12/2018   NA 125 (L) 01/12/2018   K 4.1 01/12/2018   CL 97 (L) 01/12/2018   CO2 21 (L) 01/12/2018    Lab Results  Component Value Date   ALT 233 (H) 01/12/2018   AST 94 (H) 01/12/2018   ALKPHOS 102 01/12/2018   BILITOT 0.4 01/12/2018     Microbiology: Recent Results (from the past 240 hour(s))  Urine Culture     Status: None   Collection Time: 01/08/18  4:20 AM  Result Value Ref Range Status   Specimen Description   Final    URINE, CLEAN CATCH Performed at [facility], [address], [place], [place] [address]    Special Requests   Final    NONE Performed at [facility], [address], [place], [place] [address]    Culture   Final    NO GROWTH Performed at [facility], [place] [address] [place], [place] [address]     Report Status 01/09/2018 FINAL  Final  Culture, blood (Routine X 2) w Reflex to ID Panel     Status: None (Preliminary result)   Collection Time: 01/08/18  4:48 AM  Result Value Ref Range Status   Specimen Description BLOOD LEFT ARM  Final   Special Requests   Final    BOTTLES DRAWN AEROBIC AND ANAEROBIC Blood Culture results may not be optimal due to an inadequate volume of blood received in culture bottles   Culture   Final    NO GROWTH 4 DAYS Performed at [facility], [address] [place], [place] [address]    Report Status PENDING  Incomplete  Culture, blood (Routine X 2) w Reflex to ID Panel     Status: None (Preliminary result)   Collection Time: 01/08/18  5:41 AM  Result Value Ref Range Status   Specimen Description BLOOD RIGHT ARM  Final   Special Requests   Final    BOTTLES DRAWN AEROBIC AND ANAEROBIC Blood Culture adequate volume   Culture   Final    NO GROWTH 4 DAYS Performed at [facility], [address], [place], [place] [address]    Report Status PENDING  Incomplete   ASSESSMENT:  77yo M undergoing treatment for culture negative T9-T10 discitis and epidural abscess with Vancomycin + Ceftriaxone who started to develop rash and eosinophilia with transaminitis roughly the start of 3rd week of abtx regimen of vancomycin and ceftriaxone c/w DRESS syndrome. His antibiotics have been changed to daptomycin currently. DRESS more likely caused by vancomycin than cephalosporins but too difficult to tell since given at the same time.  Eosinophilia has resolved and transaminitis improving slowly. Currently projected end date for treatment 02/13/2018.   She would like neurosurgery team here at [facility] to evaluate as she would like care transferred to [facility] considering the travel burden to [facility]. Currently using [facility]. PICC line in RUE in place and clean/dry.    PLAN: 1. Continue Daptomycin 8mg /kg 2. Appreciate neurosurgery evaluation here at  [facility] 3. Consider adding levaquin once daily for empiric treatment prior to discharge 4. Continue steroids - will need taper in the future.   Janene Madeira, MSN, NP-C [facility] for Infectious [place] Cell: [phone] Pager: [phone]  01/12/2018  10:12 AM

## 2018-01-12 NOTE — Progress Notes (Addendum)
Patient ID: Damon Parrish, male   DOB: [birth year], 77 y.o.   MRN: [id number]  PROGRESS NOTE    Damon Parrish  RKY:[id number] DOB: 1941/03/17 DOA: 01/08/2018  PCP: Tobe Sos, MD   Brief Narrative:  77 y.o. male whoinitially [facility] to [facility] with complaints of lower back painlate last month. 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 He was empirically started on vancomycin and ceftriaxone and transferred to [facility] health for further evaluation and treatment. IR was consulted for a bone biopsy. He was continued on vancomycin and ceftriaxone. Apparently the blood and biopsy results were without growth. Infectious disease evaluated the patient and recommended continuing him on vancomycin and ceftriaxone for a total of 8 weeks with the end date being 02/13/2018. The patient had been subsequently discharged home after that with a home PICC line. He was also supposed to have some repeat imaging in about 8 weeks. He had follow-up with infectious disease and orthopedics arrange for outpatient management. Reportedly he had been at home on IV antibiotics for approximately 2 weeks. He then started to develop fevers and generalized rash. His wife gave him an EpiPen dose without response. Started on dapto.   Assessment & Plan:   Principal Problem:   Adverse drug reaction - Due to vanco - No significant changes - Will consult wound care    Active Problems:   Epidural abscess / Osteomyelitis of thoracic spine (HCC)  - Continue daptomycin    Hyponatremia - Likely due to dehydration or acute infectious process - Sodium is now 125 over past 2 days - We stopped IV fluids - Pt is on regular diet so hope his sodium gradually normalizes     Abnormal LFT's - Possibly from statin therapy  - LFT's gradually trending down - ALP and bilirubin are WNL   DVT prophylaxis: Lovenox  subQ Code Status: DNR/DNI Family Communication: wife at bedside  Disposition Plan: home once rash improves     Consultants:   ID  Procedures:   None   Antimicrobials:   Daptomycin 4/18 -->   Subjective: No overnight events.  Objective: Vitals:   01/11/18 0509 01/11/18 1300 01/11/18 1948 01/12/18 0417  BP: 131/68 109/77 130/76 103/68  Pulse: 85 82 98 76  Resp:      Temp: 98.3 F (36.8 C) 97.7 F (36.5 C) 98.1 F (36.7 C) (!) 97.5 F (36.4 C)  TempSrc: Oral Oral Oral Oral  SpO2: 97% 98% 98% 96%  Weight:      Height:        Intake/Output Summary (Last 24 hours) at 01/12/2018 0951 Last data filed at 01/11/2018 1500 Gross per 24 hour  Intake 490 ml  Output -  Net 490 ml   Filed Weights   01/08/18 0413  Weight: 72.6 kg (160 lb)     Physical Exam  Constitutional: Appears well-developed and well-nourished. No distress.  CVS: RRR, S1/S2 + Pulmonary: Effort and breath sounds normal, no stridor, rhonchi, wheezes, rales.  Abdominal: Soft. BS +,  no distension, tenderness, rebound or guarding.  Musculoskeletal: Normal range of motion. No edema and no tenderness.  Lymphadenopathy: No lymphadenopathy noted, cervical, inguinal. Neuro: Alert. Normal reflexes, muscle tone coordination. No cranial nerve deficit. Skin: Skin is warm with scattered rash over arms, legs, trunk Psychiatric: Normal mood and affect. Behavior, judgment, thought content normal.     Data Reviewed: I have personally reviewed following labs and imaging studies  CBC: Recent Labs  Lab 01/08/18 0542 01/09/18 0855 01/11/18 0431 01/12/18 0446  WBC 6.4 9.2 7.6 5.7  NEUTROABS 4.8 6.8 3.1 2.8  HGB 13.4 12.5* 11.1* 10.1*  HCT 39.6 36.6* 32.2* 28.7*  MCV 94.3 92.7 92.8 92.0  PLT 215 228 272 665   Basic Metabolic Panel: Recent Labs  Lab 01/08/18 0542 01/09/18 0855 01/11/18 0431 01/12/18 0446  NA 127* 127* 125* 125*  K 4.1 3.5 3.3* 4.1  CL 91* 94* 97* 97*  CO2 23 21* 20* 21*  GLUCOSE  127* 150* 101* 149*  BUN 19 21* 16 15  CREATININE 0.97 0.71 0.79 0.67  CALCIUM 8.8* 8.3* 7.5* 7.7*   GFR: Estimated Creatinine Clearance: 69.8 mL/min (by C-G formula based on SCr of 0.67 mg/dL). Liver Function Tests: Recent Labs  Lab 01/08/18 0542 01/09/18 0855 01/10/18 0732 01/11/18 0431 01/12/18 0446  AST 202* 247* 147* 134* 94*  ALT 247* 433* 306* 277* 233*  ALKPHOS 135* 103 82 82 102  BILITOT 0.6 0.6 0.6 0.6 0.4  PROT 6.5 5.5* 4.5* 4.2* 4.1*  ALBUMIN 3.2* 2.7* 2.2* 2.1* 2.1*   No results for input(s): LIPASE, AMYLASE in the last 168 hours. No results for input(s): AMMONIA in the last 168 hours. Coagulation Profile: Recent Labs  Lab 01/10/18 0732 01/11/18 0431 01/12/18 0446  INR 1.46 1.45 1.44   Cardiac Enzymes: Recent Labs  Lab 01/09/18 1100  CKTOTAL 25*   BNP (last 3 results) No results for input(s): PROBNP in the last 8760 hours. HbA1C: No results for input(s): HGBA1C in the last 72 hours. CBG: No results for input(s): GLUCAP in the last 168 hours. Lipid Profile: No results for input(s): CHOL, HDL, LDLCALC, TRIG, CHOLHDL, LDLDIRECT in the last 72 hours. Thyroid Function Tests: No results for input(s): TSH, T4TOTAL, FREET4, T3FREE, THYROIDAB in the last 72 hours. Anemia Panel: No results for input(s): VITAMINB12, FOLATE, FERRITIN, TIBC, IRON, RETICCTPCT in the last 72 hours. Urine analysis:    Component Value Date/Time   COLORURINE AMBER (A) 01/08/2018 0419   APPEARANCEUR CLOUDY (A) 01/08/2018 0419   LABSPEC 1.027 01/08/2018 0419   PHURINE 6.0 01/08/2018 0419   GLUCOSEU NEGATIVE 01/08/2018 0419   HGBUR NEGATIVE 01/08/2018 0419   BILIRUBINUR NEGATIVE 01/08/2018 0419   KETONESUR 5 (A) 01/08/2018 0419   PROTEINUR 100 (A) 01/08/2018 0419   UROBILINOGEN 0.2 05/04/2015 1043   NITRITE NEGATIVE 01/08/2018 0419   LEUKOCYTESUR NEGATIVE 01/08/2018 0419   Sepsis Labs: @LABRCNTIP (procalcitonin:4,lacticidven:4)   Urine Culture     Status: None    Collection Time: 01/08/18  4:20 AM  Result Value Ref Range Status   Specimen Description   Final    URINE, CLEAN CATCH Performed at [facility], [address], [place], [place] [address]    Special Requests   Final    NONE Performed at [facility], [address], [place], [place] [address]    Culture   Final    NO GROWTH    Report Status 01/09/2018 FINAL  Final  Culture, blood (Routine X 2) w Reflex to ID Panel     Status: None (Preliminary result)   Collection Time: 01/08/18  4:48 AM  Result Value Ref Range Status   Specimen Description BLOOD LEFT ARM  Final   Special Requests   Final    BOTTLES DRAWN AEROBIC AND ANAEROBIC Blood Culture results may not be optimal due to an inadequate volume of blood received in culture bottles   Culture   Final    NO  GROWTH 2 DAYS    Report Status PENDING  Incomplete  Culture, blood (Routine X 2) w Reflex to ID Panel     Status: None (Preliminary result)   Collection Time: 01/08/18  5:41 AM  Result Value Ref Range Status   Specimen Description BLOOD RIGHT ARM  Final   Special Requests   Final    BOTTLES DRAWN AEROBIC AND ANAEROBIC Blood Culture adequate volume   Culture   Final    NO GROWTH 2 DAYS Performed at [facility], [address], [place],  [address]    Report Status PENDING  Incomplete      Radiology Studies: Dg Chest Port 1 View Result Date: 01/09/2018 1. Left PICC terminates over the lower SVC. 2. Clear lungs.  Dg Chest Port 1 View Result Date: 01/08/2018 1. No radiographic evidence for active cardiopulmonary disease. 2. Aortic atherosclerosis.   US Abdomen Limited Ruq Result Date: 01/08/2018 Layering sludge and/or small stones within the gallbladder. No wall thickening or evidence of acute cholecystitis.     Scheduled Meds: . amLODipine  10 mg Oral Daily  . aspirin  325 mg Oral Daily  . docusate sodium  100 mg Oral BID  . enoxaparin   40 mg Subcutaneous Q24H   Continuous Infusions: . DAPTOmycin (CUBICIN)   IV Stopped (01/11/18 1745)     LOS: 3 days    Time spent: 25 minutes  Greater than 50% of the time spent on counseling and coordinating the care.   Leisa Lenz, MD [facility] Pager [phone]  If 7PM-7AM, please contact night-coverage [email or web address] Password [facility] 01/12/2018, 9:51 AM

## 2018-01-12 NOTE — Progress Notes (Signed)
Physical Therapy Treatment Patient Details Name: Damon Parrish MRN: [id number] DOB: 11/19/1940 Today's Date: 01/12/2018    History of Present Illness 77 y.o. Male who initially presented to [facility] with complaints of lower back pain late last month. 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  He was empirically started on vancomycin and ceftriaxone and transferred to [facility] for further evaluation and treatment. Reportedly he had been at home on IV antibiotics for approximately 2 weeks.  He then started to develop fevers and generalized rash. His wife gave him an EpiPen dose without response.     PT Comments    Pt performed gait training and able to progress distance significantly from previous session.  Pt required re-education on spinal precautions.  Pt remains slow and guarded due to pain and continues to c/o itchiness.  Plan to return home with support from his wife remains appropriate.      Follow Up Recommendations  Home health PT;Supervision/Assistance - 24 hour     Equipment Recommendations  None recommended by PT    Recommendations for Other Services OT consult     Precautions / Restrictions Precautions Precaution Comments: Re-educated on back precautions pre session.   Restrictions Weight Bearing Restrictions: No    Mobility  Bed Mobility Overal bed mobility: Needs Assistance Bed Mobility: Rolling;Sidelying to Sit;Sit to Sidelying Rolling: Min guard(cues for hooklying. pushing with L E and reaching for railing.  ) Sidelying to sit: Min assist(cues for sequencing and min assistance for trunk elevation.  )       General bed mobility comments: Pt performed rolling and sidelying to sit.  Instructed in the importance of rolling to avoid twisting.    Transfers Overall transfer level: Needs assistance Equipment used: Rolling walker (2 wheeled) Transfers: Sit  to/from Stand Sit to Stand: Min guard         General transfer comment: Cues for hand placement as patient attempts to pull on RW into standing.    Ambulation/Gait Ambulation/Gait assistance: Min guard Ambulation Distance (Feet): 150 Feet Assistive device: Rolling walker (2 wheeled) Gait Pattern/deviations: Step-through pattern;Decreased step length - right;Decreased step length - left;Shuffle;Antalgic;Narrow base of support Gait velocity: slowed   General Gait Details: Cues for upper trunk control and maintaining close proximity to RW.  Pt able to progress gait this afternoon.     Stairs             Wheelchair Mobility    Modified Rankin (Stroke Patients Only)       Balance Overall balance assessment: Needs assistance   Sitting balance-Leahy Scale: Fair       Standing balance-Leahy Scale: Poor Standing balance comment: benefits from RW for balance                            Cognition Arousal/Alertness: Awake/alert Behavior During Therapy: WFL for tasks assessed/performed Overall Cognitive Status: Within Functional Limits for tasks assessed                                        Exercises      General Comments        Pertinent Vitals/Pain Pain Assessment: 0-10 Pain Score: 5  Pain Location: back - but bigger complaint is that he's itchy everywhere Pain Descriptors / Indicators: Aching;Constant;Other (Comment)(constantly  itchy) Pain Intervention(s): Monitored during session;Repositioned    Home Living                      Prior Function            PT Goals (current goals can now be found in the care plan section) Acute Rehab PT Goals Patient Stated Goal: have less pain Potential to Achieve Goals: Fair Progress towards PT goals: Progressing toward goals    Frequency    Min 3X/week      PT Plan Current plan remains appropriate    Co-evaluation              AM-PAC PT "6 Clicks" Daily  Activity  Outcome Measure  Difficulty turning over in bed (including adjusting bedclothes, sheets and blankets)?: Unable Difficulty moving from lying on back to sitting on the side of the bed? : Unable Difficulty sitting down on and standing up from a chair with arms (e.g., wheelchair, bedside commode, etc,.)?: Unable Help needed moving to and from a bed to chair (including a wheelchair)?: A Little Help needed walking in hospital room?: A Little Help needed climbing 3-5 steps with a railing? : A Lot 6 Click Score: 11    End of Session Equipment Utilized During Treatment: Gait belt Activity Tolerance: Patient limited by pain Patient left: (left sitting on toilet to have BM.  Instructed patient to use string for call light when finished.  )   PT Visit Diagnosis: Pain;Other abnormalities of gait and mobility (R26.89);Difficulty in walking, not elsewhere classified (R26.2) Pain - part of body: (back.  )     Time: 5003-7048 PT Time Calculation (min) (ACUTE ONLY): 21 min  Charges:  $Gait Training: 8-22 mins                    G Codes:       Governor Rooks, PTA pager [phone]    Cristela Blue 01/12/2018, 4:17 PM

## 2018-01-13 DIAGNOSIS — T368X5A Adverse effect of other systemic antibiotics, initial encounter: Secondary | ICD-10-CM

## 2018-01-13 DIAGNOSIS — T361X5A Adverse effect of cephalosporins and other beta-lactam antibiotics, initial encounter: Secondary | ICD-10-CM

## 2018-01-13 DIAGNOSIS — Z95828 Presence of other vascular implants and grafts: Secondary | ICD-10-CM

## 2018-01-13 LAB — CULTURE, BLOOD (ROUTINE X 2)
Culture: NO GROWTH
Culture: NO GROWTH
Special Requests: ADEQUATE

## 2018-01-13 MED ORDER — PROSIGHT PO TABS
1.0000 | ORAL_TABLET | Freq: Every day | ORAL | Status: DC
  Administered 2018-01-13 – 2018-01-15 (×3): 1 via ORAL
  Filled 2018-01-13 (×3): qty 1

## 2018-01-13 NOTE — Progress Notes (Signed)
[facility]  Date of Admission:  01/08/2018   .  Total days of antibiotics    Patient ID: Damon Parrish is a 77 y.o. male with  Principal Problem:   Adverse drug reaction Active Problems:   Discitis   Epidural abscess   Osteomyelitis of thoracic spine (HCC)   Dermatitis, drug-induced   Pressure injury of skin   Allergic reaction   DRESS syndrome   . amLODipine  10 mg Oral Daily  . aspirin  325 mg Oral Daily  . brinzolamide  1 drop Both Eyes BID   And  . brimonidine  1 drop Both Eyes BID  . docusate sodium  100 mg Oral BID  . enoxaparin (LOVENOX) injection  40 mg Subcutaneous Q24H  . feeding supplement (ENSURE ENLIVE)  237 mL Oral BID BM  . fluticasone  1 spray Each Nare Daily  . latanoprost  1 drop Both Eyes QHS  . loratadine  10 mg Oral Daily  . multivitamin  1 tablet Oral Daily  . multivitamin with minerals  1 tablet Oral Daily  . predniSONE  40 mg Oral Q breakfast  . sodium chloride flush  10-40 mL Intracatheter Q12H    SUBJECTIVE: Awake today. Reports he "does not feel well." Skin hurts when pressed and very itchy. He has not taken any hydroxyzine yet.    Allergies  Allergen Reactions  . Cephalosporins Rash  . Vancomycin Rash    OBJECTIVE: Vitals:   01/12/18 1305 01/12/18 2007 01/13/18 0819 01/13/18 1209  BP: 110/64 108/70 111/71 107/69  Pulse: 76 76 70 71  Resp: 16 18 14    Temp: 98 F (36.7 C) 98 F (36.7 C) 97.6 F (36.4 C) 97.8 F (36.6 C)  TempSrc: Oral Oral Oral Oral  SpO2: 100% 98%  98%  Weight:      Height:       Body mass index is 25.82 kg/m.  Physical Exam  Constitutional: He is oriented to person, place, and time. He appears well-developed and well-nourished.  Resting in bed. Appears comfortable.    HENT:  Mouth/Throat: No oropharyngeal exudate.  Some flakes on top lip but mucous membranes otherwise intact.   Eyes: Pupils are equal, round, and reactive to light. No scleral icterus.    Cardiovascular: Normal rate, regular rhythm, normal heart sounds and intact distal pulses.  No murmur heard. Pulmonary/Chest: Effort normal and breath sounds normal. No respiratory distress. He has no wheezes.  Abdominal: Soft. Bowel sounds are normal. He exhibits distension.  Musculoskeletal: Normal range of motion. He exhibits no edema.  Lymphadenopathy:    He has no cervical adenopathy.  Neurological: He is alert and oriented to person, place, and time.  Skin: Skin is warm and dry. Rash noted. There is erythema.  Generalized coalesced erythematous rash noted knees to head. Multiple areas of flaking skin noted to bilateral ears. No bullae/vesicles. Below the knees darker non-blanching red spotty rash c/w vasculitis appearance. Also noted to inside of arms/forearms.  Nursing note and vitals reviewed.  Lab Results Lab Results  Component Value Date   WBC 5.7 01/12/2018   HGB 10.1 (L) 01/12/2018   HCT 28.7 (L) 01/12/2018   MCV 92.0 01/12/2018   PLT 259 01/12/2018    Lab Results  Component Value Date   CREATININE 0.67 01/12/2018   BUN 15 01/12/2018   NA 125 (L) 01/12/2018   K 4.1 01/12/2018   CL 97 (L) 01/12/2018  CO2 21 (L) 01/12/2018    Lab Results  Component Value Date   ALT 233 (H) 01/12/2018   AST 94 (H) 01/12/2018   ALKPHOS 102 01/12/2018   BILITOT 0.4 01/12/2018     Microbiology: Recent Results (from the past 240 hour(s))  Urine Culture     Status: None   Collection Time: 01/08/18  4:20 AM  Result Value Ref Range Status   Specimen Description   Final    URINE, CLEAN CATCH Performed at [facility], [address] [place], [place] [address]    Special Requests   Final    NONE Performed at [facility], [address] [place], [place] [address]    Culture   Final    NO GROWTH Performed at [facility], [place] [address], [place], [place] [address]    Report Status 01/09/2018 FINAL  Final  Culture, blood (Routine X 2) w Reflex to ID Panel     Status:  None   Collection Time: 01/08/18  4:48 AM  Result Value Ref Range Status   Specimen Description BLOOD LEFT ARM  Final   Special Requests   Final    BOTTLES DRAWN AEROBIC AND ANAEROBIC Blood Culture results may not be optimal due to an inadequate volume of blood received in culture bottles   Culture   Final    NO GROWTH 5 DAYS Performed at [facility], [address] [place], [place] [address]    Report Status 01/13/2018 FINAL  Final  Culture, blood (Routine X 2) w Reflex to ID Panel     Status: None   Collection Time: 01/08/18  5:41 AM  Result Value Ref Range Status   Specimen Description BLOOD RIGHT ARM  Final   Special Requests   Final    BOTTLES DRAWN AEROBIC AND ANAEROBIC Blood Culture adequate volume   Culture   Final    NO GROWTH 5 DAYS Performed at [facility], [address] [place], [place] [address]    Report Status 01/13/2018 FINAL  Final   ASSESSMENT:  77yo M undergoing treatment for culture negative T9-T10 discitis and epidural abscess with Vancomycin + Ceftriaxone who started to develop rash and eosinophilia with transaminitis roughly the start of 3rd week of abtx regimen of vancomycin and ceftriaxone c/w DRESS syndrome. His antibiotics have been changed to daptomycin currently. DRESS more likely caused by vancomycin than cephalosporins but too difficult to tell since given at the same time. 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 Two processes going on? Punch biopsy to two separate areas may be helpful to better understand. Outpatient dermatology follow up recommended.   Eosinophilia has resolved and transaminitis improving slowly. Currently projected end date for treatment 02/13/2018.   She would like neurosurgery team here at [facility] to evaluate as she would like care transferred to [facility] considering the travel burden to [facility]. Currently using [facility]. PICC line in LUE in place and clean/dry.     PLAN: 1. Continue Daptomycin 8mg /kg 2. Will repeat MRI of thoracic region to follow up abscess 3. Will add levaquin once daily for empiric treatment prior to discharge. Would like to better understand rash first.  4. Continue steroids - will need taper in the future.  5. ?need for skin biopsy   Janene Madeira, MSN, NP-C [facility] [place] Cell: [phone] Pager: [phone]  01/13/2018  2:16 PM

## 2018-01-13 NOTE — Plan of Care (Signed)
  Problem: Education: Goal: Knowledge of General Education information will improve Outcome: Progressing   Problem: Clinical Measurements: Goal: Ability to maintain clinical measurements within normal limits will improve Outcome: Progressing Goal: Will remain free from infection Outcome: Progressing Goal: Diagnostic test results will improve Outcome: Progressing Goal: Respiratory complications will improve Outcome: Progressing Goal: Cardiovascular complication will be avoided Outcome: Progressing   Problem: Activity: Goal: Risk for activity intolerance will decrease Outcome: Progressing   Problem: Nutrition: Goal: Adequate nutrition will be maintained Outcome: Progressing   Problem: Coping: Goal: Level of anxiety will decrease Outcome: Progressing   Problem: Elimination: Goal: Will not experience complications related to bowel motility Outcome: Progressing Goal: Will not experience complications related to urinary retention Outcome: Progressing   Problem: Pain Managment: Goal: General experience of comfort will improve Outcome: Progressing   Problem: Safety: Goal: Ability to remain free from injury will improve Outcome: Progressing   Problem: Skin Integrity: Goal: Risk for impaired skin integrity will decrease Outcome: Progressing   Problem: Education: Goal: Knowledge of General Education information will improve Outcome: Progressing   Problem: Health Behavior/Discharge Planning: Goal: Ability to manage health-related needs will improve Outcome: Progressing   Problem: Clinical Measurements: Goal: Ability to maintain clinical measurements within normal limits will improve Outcome: Progressing Goal: Will remain free from infection Outcome: Progressing Goal: Diagnostic test results will improve Outcome: Progressing Goal: Respiratory complications will improve Outcome: Progressing Goal: Cardiovascular complication will be avoided Outcome: Progressing    Problem: Activity: Goal: Risk for activity intolerance will decrease Outcome: Progressing   Problem: Nutrition: Goal: Adequate nutrition will be maintained Outcome: Progressing   Problem: Coping: Goal: Level of anxiety will decrease Outcome: Progressing   Problem: Elimination: Goal: Will not experience complications related to bowel motility Outcome: Progressing Goal: Will not experience complications related to urinary retention Outcome: Progressing   Problem: Pain Managment: Goal: General experience of comfort will improve Outcome: Progressing   Problem: Safety: Goal: Ability to remain free from injury will improve Outcome: Progressing   Problem: Skin Integrity: Goal: Risk for impaired skin integrity will decrease Outcome: Progressing

## 2018-01-13 NOTE — Progress Notes (Signed)
Patient ID: Damon Parrish, male   DOB: [birth year], 77 y.o.   MRN: [id number]  PROGRESS NOTE    Damon Parrish  XBJ:[id number] DOB: 06/14/1941 DOA: 01/08/2018  PCP: Tobe Sos, MD   Brief Narrative:  77 y.o. male whoinitially presented to [facility] with complaints of lower back painlate last month. 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 He was empirically started on vancomycin and ceftriaxone and transferred to [facility] health for further evaluation and treatment. IR was consulted for a bone biopsy. He was continued on vancomycin and ceftriaxone. Apparently the blood and biopsy results were without growth. Infectious disease evaluated the patient and recommended continuing him on vancomycin and ceftriaxone for a total of 8 weeks with the end date being 02/13/2018. The patient had been subsequently discharged home after that with a home PICC line. He was also supposed to have some repeat imaging in about 8 weeks. He had follow-up with infectious disease and orthopedics arrange for outpatient management. Reportedly he had been at home on IV antibiotics for approximately 2 weeks. He then started to develop fevers and generalized rash. His wife gave him an EpiPen dose without response. Started on dapto.   Assessment & Plan:   Principal Problem:   Adverse drug reaction - Secondary to vanco - Appreciate ID following and their recommendations - Appreciate wound care assessment    Active Problems:   Epidural abscess / Osteomyelitis of thoracic spine (HCC)  - Continue daptomycin     Hyponatremia - Likely due to dehydration or acute infectious process - Sodium 125 - BMP pending this am    Abnormal LFT's - Possibly from statin therapy  - LFT's gradually trending down - ALP and bilirubin are WNL   DVT prophylaxis: Lovenox subQ Code Status: DNR/DNI Family Communication:  wife at bedside Disposition Plan: home once rash improves      Consultants:   ID  Procedures:   None   Antimicrobials:   Daptomycin 4/18 -->   Subjective: No overnight events.   Objective: Vitals:   01/12/18 1305 01/12/18 2007 01/13/18 0819 01/13/18 1209  BP: 110/64 108/70 111/71 107/69  Pulse: 76 76 70 71  Resp: 16 18 14    Temp: 98 F (36.7 C) 98 F (36.7 C) 97.6 F (36.4 C) 97.8 F (36.6 C)  TempSrc: Oral Oral Oral Oral  SpO2: 100% 98%  98%  Weight:      Height:        Intake/Output Summary (Last 24 hours) at 01/13/2018 1244 Last data filed at 01/12/2018 1300 Gross per 24 hour  Intake 240 ml  Output -  Net 240 ml   Filed Weights   01/08/18 0413  Weight: 72.6 kg (160 lb)     Physical Exam  Constitutional: Appears well-developed and well-nourished. No distress.   CVS: RRR, S1/S2 + Pulmonary: Effort and breath sounds normal, no stridor, rhonchi, wheezes, rales.  Abdominal: Soft. BS +,  no distension, tenderness, rebound or guarding.  Musculoskeletal: Normal range of motion. No edema and no tenderness.  Lymphadenopathy: No lymphadenopathy noted, cervical, inguinal. Neuro: Alert. Normal reflexes, muscle tone coordination. No cranial nerve deficit. Skin: generalized rash, erythema Psychiatric: Normal mood and affect. Behavior, judgment, thought content normal.     Data Reviewed: I have personally reviewed following labs and imaging studies  CBC: Recent Labs  Lab 01/08/18 0542 01/09/18 0855 01/11/18 0431 01/12/18 0446  WBC 6.4 9.2 7.6 5.7  NEUTROABS 4.8 6.8 3.1 2.8  HGB 13.4 12.5* 11.1* 10.1*  HCT 39.6 36.6* 32.2* 28.7*  MCV 94.3 92.7 92.8 92.0  PLT 215 228 272 809   Basic Metabolic Panel: Recent Labs  Lab 01/08/18 0542 01/09/18 0855 01/11/18 0431 01/12/18 0446  NA 127* 127* 125* 125*  K 4.1 3.5 3.3* 4.1  CL 91* 94* 97* 97*  CO2 23 21* 20* 21*  GLUCOSE 127* 150* 101* 149*  BUN 19 21* 16 15  CREATININE 0.97 0.71 0.79 0.67  CALCIUM  8.8* 8.3* 7.5* 7.7*   GFR: Estimated Creatinine Clearance: 69.8 mL/min (by C-G formula based on SCr of 0.67 mg/dL). Liver Function Tests: Recent Labs  Lab 01/08/18 0542 01/09/18 0855 01/10/18 0732 01/11/18 0431 01/12/18 0446  AST 202* 247* 147* 134* 94*  ALT 247* 433* 306* 277* 233*  ALKPHOS 135* 103 82 82 102  BILITOT 0.6 0.6 0.6 0.6 0.4  PROT 6.5 5.5* 4.5* 4.2* 4.1*  ALBUMIN 3.2* 2.7* 2.2* 2.1* 2.1*   No results for input(s): LIPASE, AMYLASE in the last 168 hours. No results for input(s): AMMONIA in the last 168 hours. Coagulation Profile: Recent Labs  Lab 01/10/18 0732 01/11/18 0431 01/12/18 0446  INR 1.46 1.45 1.44   Cardiac Enzymes: Recent Labs  Lab 01/09/18 1100  CKTOTAL 25*   BNP (last 3 results) No results for input(s): PROBNP in the last 8760 hours. HbA1C: No results for input(s): HGBA1C in the last 72 hours. CBG: No results for input(s): GLUCAP in the last 168 hours. Lipid Profile: No results for input(s): CHOL, HDL, LDLCALC, TRIG, CHOLHDL, LDLDIRECT in the last 72 hours. Thyroid Function Tests: No results for input(s): TSH, T4TOTAL, FREET4, T3FREE, THYROIDAB in the last 72 hours. Anemia Panel: No results for input(s): VITAMINB12, FOLATE, FERRITIN, TIBC, IRON, RETICCTPCT in the last 72 hours. Urine analysis:    Component Value Date/Time   COLORURINE AMBER (A) 01/08/2018 0419   APPEARANCEUR CLOUDY (A) 01/08/2018 0419   LABSPEC 1.027 01/08/2018 0419   PHURINE 6.0 01/08/2018 0419   GLUCOSEU NEGATIVE 01/08/2018 0419   HGBUR NEGATIVE 01/08/2018 0419   BILIRUBINUR NEGATIVE 01/08/2018 0419   KETONESUR 5 (A) 01/08/2018 0419   PROTEINUR 100 (A) 01/08/2018 0419   UROBILINOGEN 0.2 05/04/2015 1043   NITRITE NEGATIVE 01/08/2018 0419   LEUKOCYTESUR NEGATIVE 01/08/2018 0419   Sepsis Labs: @LABRCNTIP (procalcitonin:4,lacticidven:4)   Urine Culture     Status: None   Collection Time: 01/08/18  4:20 AM  Result Value Ref Range Status   Specimen Description    Final    URINE, CLEAN CATCH Performed at [facility], [address], [place], [place] [address]    Special Requests   Final    NONE Performed at [facility], [address] [place], [place] [address]    Culture   Final    NO GROWTH    Report Status 01/09/2018 FINAL  Final  Culture, blood (Routine X 2) w Reflex to ID Panel     Status: None (Preliminary result)   Collection Time: 01/08/18  4:48 AM  Result Value Ref Range Status   Specimen Description BLOOD LEFT ARM  Final   Special Requests   Final    BOTTLES DRAWN AEROBIC AND ANAEROBIC Blood Culture results may not be optimal due to an inadequate volume of blood received in culture bottles   Culture   Final    NO GROWTH 2 DAYS    Report Status PENDING  Incomplete  Culture, blood (Routine X 2) w Reflex to  ID Panel     Status: None (Preliminary result)   Collection Time: 01/08/18  5:41 AM  Result Value Ref Range Status   Specimen Description BLOOD RIGHT ARM  Final   Special Requests   Final    BOTTLES DRAWN AEROBIC AND ANAEROBIC Blood Culture adequate volume   Culture   Final    NO GROWTH 2 DAYS Performed at [facility], [address] [place], [place] [address]    Report Status PENDING  Incomplete      Radiology Studies: Dg Chest Port 1 View Result Date: 01/09/2018 1. Left PICC terminates over the lower SVC. 2. Clear lungs.  Dg Chest Port 1 View Result Date: 01/08/2018 1. No radiographic evidence for active cardiopulmonary disease. 2. Aortic atherosclerosis.   US Abdomen Limited Ruq Result Date: 01/08/2018 Layering sludge and/or small stones within the gallbladder. No wall thickening or evidence of acute cholecystitis.     Scheduled Meds: . amLODipine  10 mg Oral Daily  . aspirin  325 mg Oral Daily  . docusate sodium  100 mg Oral BID  . enoxaparin   40 mg Subcutaneous Q24H   Continuous Infusions: . DAPTOmycin (CUBICIN)  IV 580 mg (01/13/18 1146)     LOS: 4 days    Time spent: 25 minutes  Greater than  50% of the time spent on counseling and coordinating the care.   Leisa Lenz, MD [facility] Pager [phone]  If 7PM-7AM, please contact night-coverage [email or web address] Password [facility] 01/13/2018, 12:44 PM

## 2018-01-13 NOTE — Progress Notes (Signed)
[facility] Infusion Coordinator will follow pt with ID team to support Home Infusion Pharmacy services for home IV ABX.  If patient discharges after hours, please call [phone].   Larry Sierras 01/13/2018, 4:32 PM

## 2018-01-13 NOTE — Progress Notes (Signed)
Occupational Therapy Treatment Patient Details Name: Damon Parrish MRN: [id number] DOB: 09-21-1941 Today's Date: 01/13/2018    History of present illness 77 y.o. Male who initially presented to [facility] with complaints of lower back pain late last month. 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  He was empirically started on vancomycin and ceftriaxone and transferred to [facility] for further evaluation and treatment. Reportedly he had been at home on IV antibiotics for approximately 2 weeks.  He then started to develop fevers and generalized rash. His wife gave him an EpiPen dose without response.    OT comments  Pt demonstrating progress toward OT goals. He continues to be limited by back pain and itchiness but is motivated to participate with therapies. Pt and wife educated concerning use of reacher and sock aide for LB dressing tasks as well as long handled shoe horn and long handled sponge to further improve independence with LB ADL. Pt was able to use reacher and sock aide to don socks with min assist this session. He additionally demonstrates increased activity tolerance for ADL evidenced by ability to ambulate to bathroom and complete toilet transfers with min assist. Pt continues to require max assist for toileting hygiene with wife able to provide assistance. D/C recommendation remains appropriate and OT will continue to follow while admitted.    Follow Up Recommendations  Home health OT;Supervision/Assistance - 24 hour    Equipment Recommendations  3 in 1 bedside commode    Recommendations for Other Services      Precautions / Restrictions Precautions Precaution Comments: Educated pt and wife concerning back precautions related to ADL participation.  Restrictions Weight Bearing Restrictions: No       Mobility Bed Mobility Overal bed mobility: Needs Assistance Bed  Mobility: Rolling;Sidelying to Sit;Sit to Sidelying Rolling: Min guard Sidelying to sit: Min guard     Sit to sidelying: Min guard General bed mobility comments: Min guard assist for safety with cues for log roll technique. Returned to bed with bed surface elevated to simulate home set-up.  Transfers Overall transfer level: Needs assistance Equipment used: Rolling walker (2 wheeled) Transfers: Sit to/from Stand Sit to Stand: Min assist         General transfer comment: Min assist to rise from comfort height toilet and bed.     Balance Overall balance assessment: Needs assistance Sitting-balance support: No upper extremity supported;Feet supported;Bilateral upper extremity supported Sitting balance-Leahy Scale: Fair Sitting balance - Comments: Initially balancing well but requiring BUE support after practice with AE due to pain.    Standing balance support: Bilateral upper extremity supported Standing balance-Leahy Scale: Fair Standing balance comment: Able to statically stand without UE support but requires UE support for dynamic tasks.                            ADL either performed or assessed with clinical judgement   ADL Overall ADL's : Needs assistance/impaired Eating/Feeding: Modified independent   Grooming: Supervision/safety;Standing   Upper Body Bathing: Moderate assistance;With caregiver independent assisting       Upper Body Dressing : Minimal assistance;Sitting;With caregiver independent assisting   Lower Body Dressing: Minimal assistance;Sit to/from stand;With adaptive equipment   Toilet Transfer: Minimal assistance;Ambulation;RW;Comfort height toilet;Grab bars Toilet Transfer Details (indicate cue type and reason): Able to ambulate from bed to bathroom with min assist to power up and lower down gently.  Toileting- Clothing Manipulation and Hygiene: Maximal assistance;Sit to/from stand;With caregiver independent assisting Toileting - Clothing  Manipulation Details (indicate cue type and reason): wife assisting; she has been assisting at home as well     Functional mobility during ADLs: Minimal assistance;Min guard;Rolling walker(min assist sit<>stand but min guard once ambulating) General ADL Comments: Pt educated concerning use of AE for LB ADL including sock aide, reacher, long handled shoe horn, and long handled sponge.      Vision       Perception     Praxis      Cognition Arousal/Alertness: Awake/alert Behavior During Therapy: WFL for tasks assessed/performed Overall Cognitive Status: Within Functional Limits for tasks assessed                                          Exercises     Shoulder Instructions       General Comments Pt's wife present and engaged in session.     Pertinent Vitals/ Pain       Pain Assessment: Faces Faces Pain Scale: Hurts even more Pain Location: back; also itching all over Pain Descriptors / Indicators: Aching;Constant;Other (Comment);Grimacing;Moaning(constant itch) Pain Intervention(s): Limited activity within patient's tolerance;Monitored during session;Repositioned  Home Living                                          Prior Functioning/Environment              Frequency  Min 2X/week        Progress Toward Goals  OT Goals(current goals can now be found in the care plan section)  Progress towards OT goals: Progressing toward goals  Acute Rehab OT Goals Patient Stated Goal: have less pain OT Goal Formulation: With patient/family Time For Goal Achievement: 01/25/18 Potential to Achieve Goals: Good  Plan Discharge plan remains appropriate    Co-evaluation                 AM-PAC PT "6 Clicks" Daily Activity     Outcome Measure   Help from another person eating meals?: None Help from another person taking care of personal grooming?: A Little Help from another person toileting, which includes using toliet, bedpan, or  urinal?: A Little Help from another person bathing (including washing, rinsing, drying)?: A Lot Help from another person to put on and taking off regular upper body clothing?: A Little Help from another person to put on and taking off regular lower body clothing?: A Little 6 Click Score: 18    End of Session Equipment Utilized During Treatment: Rolling walker  OT Visit Diagnosis: Unsteadiness on feet (R26.81);Other abnormalities of gait and mobility (R26.89);Muscle weakness (generalized) (M62.81)   Activity Tolerance Patient limited by pain   Patient Left in bed;with call bell/phone within reach;with family/visitor present   Nurse Communication Mobility status        Time: 7124-5809 OT Time Calculation (min): 22 min  Charges: OT General Charges $OT Visit: 1 Visit OT Treatments $Self Care/Home Management : 8-22 mins  Norman Herrlich, MS OTR/L  Pager: [place] [facility] Cortne Amara 01/13/2018, 4:38 PM

## 2018-01-13 NOTE — Care Management Important Message (Signed)
Important Message  Patient Details  Name: Damon Parrish MRN: [id number] Date of Birth: April 23, 1941   Medicare Important Message Given:       Orbie Pyo 01/13/2018, 2:12 PM

## 2018-01-13 NOTE — Consult Note (Signed)
[place] Nurse wound consult note Reason for Consult: rash related possibly to drug reaction.  Had been on home IV antibiotics 2 weeks prior to rash development Wound type: systemic non raised rash that began per family report on his trunk, spread to the arms and head, and lastly spread over the bilateral LE's. There area areas where the rash is so intense the entire skin appears red and inflamed.  Pressure Injury POA: NA Wound bed: intact skin over entire affected area. No weeping or bulla formation  Drainage (amount, consistency, odor) none Periwound: NA Dressing procedure/placement/frequency: No topical care recommended If blistering or bulla formation consider at risk for development of SJS, would need transfer to hospital with burn center for care.  I have requested that if patient or family notes any blistering any where that they notify the bedside clinical staff ASAP. Also would be beneficial if rash persist to have a dermatology work up as outpatient. Receiving diphenhydramine for itching without much relief.  Consider other  Hydroxyzine. Explained to patient to notify bedside nurse if itching worsens.  Discussed POC with patient and bedside nurse.  Re consult if needed, will not follow at this time. Thanks  Melania Kirks [employer], RN,CWOCN, CNS, [place] [phone])

## 2018-01-14 ENCOUNTER — Inpatient Hospital Stay (HOSPITAL_COMMUNITY): Payer: Medicare Other

## 2018-01-14 DIAGNOSIS — T368X5D Adverse effect of other systemic antibiotics, subsequent encounter: Secondary | ICD-10-CM

## 2018-01-14 LAB — BASIC METABOLIC PANEL
Anion gap: 9 (ref 5–15)
BUN: 16 mg/dL (ref 6–20)
CHLORIDE: 100 mmol/L — AB (ref 101–111)
CO2: 21 mmol/L — AB (ref 22–32)
CREATININE: 0.68 mg/dL (ref 0.61–1.24)
Calcium: 8 mg/dL — ABNORMAL LOW (ref 8.9–10.3)
GFR calc non Af Amer: >60 mL/min (ref >60)
Glucose, Bld: 96 mg/dL (ref 65–99)
POTASSIUM: 4 mmol/L (ref 3.5–5.1)
SODIUM: 130 mmol/L — AB (ref 135–145)

## 2018-01-14 LAB — DIFFERENTIAL
BASOS ABS: 0.1 10*3/uL (ref 0.0–0.1)
Basophils Relative: 1 %
EOS ABS: 1.1 10*3/uL — AB (ref 0.0–0.7)
Eosinophils Relative: 9 %
LYMPHS ABS: 5.1 10*3/uL — AB (ref 0.7–4.0)
LYMPHS PCT: 42 %
MONOS PCT: 11 %
Monocytes Absolute: 1.3 10*3/uL — ABNORMAL HIGH (ref 0.1–1.0)
NEUTROS PCT: 37 %
Neutro Abs: 4.5 10*3/uL (ref 1.7–7.7)

## 2018-01-14 LAB — CBC
HEMATOCRIT: 28.8 % — AB (ref 39.0–52.0)
HEMOGLOBIN: 9.9 g/dL — AB (ref 13.0–17.0)
MCH: 32.1 pg (ref 26.0–34.0)
MCHC: 34.4 g/dL (ref 30.0–36.0)
MCV: 93.5 fL (ref 78.0–100.0)
Platelets: 286 10*3/uL (ref 150–400)
RBC: 3.08 MIL/uL — AB (ref 4.22–5.81)
RDW: 14.2 % (ref 11.5–15.5)
WBC: 12 10*3/uL — ABNORMAL HIGH (ref 4.0–10.5)

## 2018-01-14 MED ORDER — LEVOFLOXACIN 500 MG PO TABS
500.0000 mg | ORAL_TABLET | Freq: Every day | ORAL | Status: DC
  Administered 2018-01-14 – 2018-01-17 (×4): 500 mg via ORAL
  Filled 2018-01-14 (×4): qty 1

## 2018-01-14 MED ORDER — GADOBENATE DIMEGLUMINE 529 MG/ML IV SOLN
16.0000 mL | Freq: Once | INTRAVENOUS | Status: AC | PRN
  Administered 2018-01-14: 16 mL via INTRAVENOUS

## 2018-01-14 NOTE — Progress Notes (Addendum)
Patient ID: Damon Parrish, male   DOB: 1941-03-20, 76 y.o.   MRN: [id number]  PROGRESS NOTE    WERNER LABELLA  IPJ:[id number] DOB: [birth year] DOA: 01/08/2018  PCP: Tobe Sos, MD   Brief Narrative:  77 y.o. male whoinitially presented to [facility] with complaints of lower back painlate last month. 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 He was empirically started on vancomycin and ceftriaxone and transferred to [facility] health for further evaluation and treatment. IR was consulted for a bone biopsy. He was continued on vancomycin and ceftriaxone. Apparently the blood and biopsy results were without growth. Infectious disease evaluated the patient and recommended continuing him on vancomycin and ceftriaxone for a total of 8 weeks with the end date being 02/13/2018. The patient had been subsequently discharged home after that with a home PICC line. He was also supposed to have some repeat imaging in about 8 weeks. He had follow-up with infectious disease and orthopedics arrange for outpatient management. Reportedly he had been at home on IV antibiotics for approximately 2 weeks. He then started to develop fevers and generalized rash. His wife gave him an EpiPen dose without response. Started on dapto.   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  Objective: Vitals:   01/13/18 0819 01/13/18 1209 01/13/18 2022 01/14/18 0517  BP: 111/71 107/69 111/67 116/76  Pulse: 70 71 75 69  Resp: 14  18 18   Temp: 97.6 F (36.4 C) 97.8 F (36.6 C) 98.7 F (37.1 C) 97.9 F (36.6 C)  TempSrc: Oral Oral Oral Oral  SpO2:  98% 99% 98%  Weight:      Height:        Intake/Output Summary (Last 24 hours) at 01/14/2018 1214 Last data filed at 01/14/2018 0900 Gross per 24 hour  Intake 480 ml  Output -  Net 480 ml   Filed Weights   01/08/18 0413  Weight: 72.6 kg (160 lb)     Physical Exam  Constitutional: Appears well-developed and well-nourished. No distress.  CVS: RRR, S1/S2 + Pulmonary: Effort and breath sounds normal, no stridor, rhonchi, wheezes, rales.  Abdominal: Soft. BS +,  no distension, tenderness, rebound or guarding.  Musculoskeletal: Normal range of motion. No edema and no tenderness.  Lymphadenopathy: No lymphadenopathy noted, cervical, inguinal. Neuro: Alert. Normal reflexes, muscle tone coordination. No cranial nerve deficit. Skin: generalized erythematous rash, improving on face Psychiatric: Normal mood and affect. Behavior, judgment, thought content normal.      Data Reviewed: I have personally reviewed following labs and imaging studies  CBC: Recent Labs  Lab 01/08/18 0542 01/09/18 0855 01/11/18 0431 01/12/18 0446 01/14/18 0343  WBC 6.4 9.2 7.6 5.7 12.0*  NEUTROABS 4.8 6.8 3.1 2.8  --  HGB 13.4 12.5* 11.1* 10.1* 9.9*  HCT 39.6 36.6* 32.2* 28.7* 28.8*  MCV 94.3 92.7 92.8 92.0 93.5  PLT 215 228 272 259 875   Basic Metabolic Panel: Recent Labs  Lab 01/08/18 0542 01/09/18 0855 01/11/18 0431 01/12/18 0446 01/14/18 0343  NA 127* 127* 125* 125* 130*  K 4.1 3.5 3.3* 4.1 4.0  CL 91* 94* 97* 97* 100*  CO2 23 21* 20* 21* 21*  GLUCOSE 127* 150* 101* 149* 96  BUN 19 21* 16  15 16   CREATININE 0.97 0.71 0.79 0.67 0.68  CALCIUM 8.8* 8.3* 7.5* 7.7* 8.0*   GFR: Estimated Creatinine Clearance: 69.8 mL/min (by C-G formula based on SCr of 0.68 mg/dL). Liver Function Tests: Recent Labs  Lab 01/08/18 0542 01/09/18 0855 01/10/18 0732 01/11/18 0431 01/12/18 0446  AST 202* 247* 147* 134* 94*  ALT 247* 433* 306* 277* 233*  ALKPHOS 135* 103 82 82 102  BILITOT 0.6 0.6 0.6 0.6 0.4  PROT 6.5 5.5* 4.5* 4.2* 4.1*  ALBUMIN 3.2* 2.7* 2.2* 2.1* 2.1*   No results for input(s): LIPASE, AMYLASE in the last 168 hours. No results for input(s): AMMONIA in the last 168 hours. Coagulation Profile: Recent Labs  Lab 01/10/18 0732 01/11/18 0431 01/12/18 0446  INR 1.46 1.45 1.44   Cardiac Enzymes: Recent Labs  Lab 01/09/18 1100  CKTOTAL 25*   BNP (last 3 results) No results for input(s): PROBNP in the last 8760 hours. HbA1C: No results for input(s): HGBA1C in the last 72 hours. CBG: No results for input(s): GLUCAP in the last 168 hours. Lipid Profile: No results for input(s): CHOL, HDL, LDLCALC, TRIG, CHOLHDL, LDLDIRECT in the last 72 hours. Thyroid Function Tests: No results for input(s): TSH, T4TOTAL, FREET4, T3FREE, THYROIDAB in the last 72 hours. Anemia Panel: No results for input(s): VITAMINB12, FOLATE, FERRITIN, TIBC, IRON, RETICCTPCT in the last 72 hours. Urine analysis:    Component Value Date/Time   COLORURINE AMBER (A) 01/08/2018 0419   APPEARANCEUR CLOUDY (A) 01/08/2018 0419   LABSPEC 1.027 01/08/2018 0419   PHURINE 6.0 01/08/2018 0419   GLUCOSEU NEGATIVE 01/08/2018 0419   HGBUR NEGATIVE 01/08/2018 0419   BILIRUBINUR NEGATIVE 01/08/2018 0419   KETONESUR 5 (A) 01/08/2018 0419   PROTEINUR 100 (A) 01/08/2018 0419   UROBILINOGEN 0.2 05/04/2015 1043   NITRITE NEGATIVE 01/08/2018 0419   LEUKOCYTESUR NEGATIVE 01/08/2018 0419   Sepsis Labs: @LABRCNTIP (procalcitonin:4,lacticidven:4)   Urine Culture     Status: None   Collection Time: 01/08/18  4:20  AM  Result Value Ref Range Status   Specimen Description   Final    URINE, CLEAN CATCH Performed at [facility], [address], [place], [place] [address]    Special Requests   Final    NONE Performed at [facility], [address], [place], [place] [address]    Culture   Final    NO GROWTH    Report Status 01/09/2018 FINAL  Final  Culture, blood (Routine X 2) w Reflex to ID Panel     Status: None (Preliminary result)   Collection Time: 01/08/18  4:48 AM  Result Value Ref Range Status   Specimen Description BLOOD LEFT ARM  Final   Special Requests   Final    BOTTLES DRAWN AEROBIC AND ANAEROBIC Blood Culture results may not be optimal due to an inadequate volume of blood received in culture bottles   Culture   Final    NO GROWTH 2 DAYS    Report Status PENDING  Incomplete  Culture, blood (Routine X 2) w Reflex to ID Panel     Status: None (Preliminary result)   Collection Time: 01/08/18  5:41 AM  Result Value Ref Range Status   Specimen Description BLOOD RIGHT ARM  Final   Special Requests   Final    BOTTLES DRAWN AEROBIC AND ANAEROBIC Blood Culture adequate volume   Culture   Final    NO GROWTH 2 DAYS Performed at [facility], [address] [place], [address] [address]    Report Status PENDING  Incomplete      Radiology Studies: Dg Chest Port 1 View Result Date: 01/09/2018 1. Left PICC terminates over the lower SVC. 2. Clear lungs.  Dg Chest Port 1 View Result Date: 01/08/2018 1. No radiographic evidence for active cardiopulmonary disease. 2. Aortic atherosclerosis.   US Abdomen Limited Ruq Result Date: 01/08/2018 Layering sludge and/or small stones within the gallbladder. No wall thickening or evidence of acute cholecystitis.     Scheduled Meds: . amLODipine  10 mg Oral Daily  . aspirin  325 mg Oral Daily  . docusate sodium  100 mg Oral BID  . enoxaparin   40 mg Subcutaneous Q24H   Continuous Infusions: . DAPTOmycin (CUBICIN)  IV Stopped (01/13/18 1757)      LOS: 5 days    Time spent: 25 minutes  Greater than 50% of the time spent on counseling and coordinating the care.   Leisa Lenz, MD [facility] Pager [phone]  If 7PM-7AM, please contact night-coverage [email or web address] Password [facility] 01/14/2018, 12:14 PM

## 2018-01-14 NOTE — Progress Notes (Signed)
Nutrition Follow Up  DOCUMENTATION CODES:   Not applicable  INTERVENTION:    Continue Ensure Enlive po BID, each supplement provides 350 kcal and 20 grams of protein  NUTRITION DIAGNOSIS:   Increased nutrient needs related to acute illness as evidenced by estimated needs, ongoing  GOAL:   Patient will meet greater than or equal to 90% of their needs, progressing  MONITOR:   PO intake, Supplement acceptance, Labs, Skin, Weight trends, I & O's  ASSESSMENT:   77 y.o. Male who initially presented to [facility] with complaints of lower back pain late last month. 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  He was empirically started on vancomycin and ceftriaxone and transferred to [facility] health for further evaluation and treatment.  RD spoke with pt's wife. Pt resting in bed. Wife reports pt is eating fairly. PO intake 50% per flowsheets. Drinking Ensure Enlive supplements "if I can get them to bring him one". Medications include MVI and Colace. Labs reviewed. Na 130 (L).  NUTRITION - FOCUSED PHYSICAL EXAM:    Most Recent Value  Orbital Region  No depletion  Upper Arm Region  Mild depletion  Thoracic and Lumbar Region  Unable to assess  Buccal Region  No depletion  Temple Region  No depletion  Clavicle Bone Region  Mild depletion  Clavicle and Acromion Bone Region  Mild depletion  Scapular Bone Region  Unable to assess  Dorsal Hand  No depletion  Patellar Region  No depletion  Anterior Thigh Region  No depletion  Posterior Calf Region  No depletion  Edema (RD Assessment)  None     Diet Order:  Diet Heart Room service appropriate? Yes; Fluid consistency: Thin  EDUCATION NEEDS:   Not appropriate for education at this time  Skin:  Skin Assessment: Reviewed RN Assessment  Last BM:  4/23   Intake/Output Summary (Last 24 hours) at 01/14/2018 1208 Last data filed  at 01/14/2018 0900 Gross per 24 hour  Intake 480 ml  Output -  Net 480 ml   Height:   Ht Readings from Last 1 Encounters:  01/08/18 5\' 6"  (1.676 m)   Weight:   Wt Readings from Last 1 Encounters:  01/08/18 160 lb (72.6 kg)   BMI:  Body mass index is 25.82 kg/m.  Estimated Nutritional Needs:   Kcal:  1700-1900  Protein:  80-95 gm  Fluid:  1.7-1.9 L  Arthur Holms, RD, LDN Pager #: [phone] After-Hours Pager #: [phone]

## 2018-01-14 NOTE — Progress Notes (Addendum)
[facility] for Infectious Disease  Date of Admission:  01/08/2018   .  Total days of antibiotics    Patient ID: Damon Parrish is a 77 y.o. male with  Principal Problem:   Adverse drug reaction Active Problems:   Discitis   Epidural abscess   Osteomyelitis of thoracic spine (HCC)   Dermatitis, drug-induced   Pressure injury of skin   Allergic reaction   DRESS syndrome   . amLODipine  10 mg Oral Daily  . aspirin  325 mg Oral Daily  . brinzolamide  1 drop Both Eyes BID   And  . brimonidine  1 drop Both Eyes BID  . docusate sodium  100 mg Oral BID  . enoxaparin (LOVENOX) injection  40 mg Subcutaneous Q24H  . feeding supplement (ENSURE ENLIVE)  237 mL Oral BID BM  . fluticasone  1 spray Each Nare Daily  . latanoprost  1 drop Both Eyes QHS  . loratadine  10 mg Oral Daily  . multivitamin  1 tablet Oral Daily  . multivitamin with minerals  1 tablet Oral Daily  . predniSONE  40 mg Oral Q breakfast  . sodium chloride flush  10-40 mL Intracatheter Q12H    SUBJECTIVE: Awake today. Dry mouth. Has some itching and redness over eyelids. Feels that overall his rash is fading a little.   Allergies  Allergen Reactions  . Cephalosporins Rash  . Vancomycin Rash    OBJECTIVE: Vitals:   01/13/18 0819 01/13/18 1209 01/13/18 2022 01/14/18 0517  BP: 111/71 107/69 111/67 116/76  Pulse: 70 71 75 69  Resp: 14  18 18   Temp: 97.6 F (36.4 C) 97.8 F (36.6 C) 98.7 F (37.1 C) 97.9 F (36.6 C)  TempSrc: Oral Oral Oral Oral  SpO2:  98% 99% 98%  Weight:      Height:       Body mass index is 25.82 kg/m.  Physical Exam  Constitutional: He is oriented to person, place, and time. He appears well-developed and well-nourished.  Resting in bed. Appears comfortable.    HENT:  Mouth/Throat: No oropharyngeal exudate.  Few crusts to lower right outer lip. Flakes over lips.  Eyes: Pupils are equal, round, and reactive to light. No scleral icterus. Sunken appearance.  Erythema to upper lids.  Cardiovascular: Normal rate, regular rhythm, normal heart sounds and intact distal pulses.  No murmur heard. Pulmonary/Chest: Effort normal and breath sounds normal. No respiratory distress. He has no wheezes.  Abdominal: Soft. Bowel sounds are normal. Soft.  Musculoskeletal: Normal range of motion. He exhibits no edema.  Lymphadenopathy:    He has no cervical adenopathy.  Neurological: He is alert and oriented to person, place, and time.  Skin: Skin is warm and dry. Rash noted. There is erythema.  Generalized coalesced erythematous rash noted knees to head. Multiple areas of flaking skin noted to bilateral ears. No bullae/vesicles. Below the knees darker non-blanching red spotty rash c/w vasculitis appearance. Also noted to inside of arms/forearms. >> darker areas appear to be fading a little today.  Nursing note and vitals reviewed.  Lab Results Lab Results  Component Value Date   WBC 12.0 (H) 01/14/2018   HGB 9.9 (L) 01/14/2018   HCT 28.8 (L) 01/14/2018   MCV 93.5 01/14/2018   PLT 286 01/14/2018    Lab Results  Component Value Date   CREATININE 0.68 01/14/2018   BUN 16 01/14/2018   NA 130 (L) 01/14/2018   K  4.0 01/14/2018   CL 100 (L) 01/14/2018   CO2 21 (L) 01/14/2018    Lab Results  Component Value Date   ALT 233 (H) 01/12/2018   AST 94 (H) 01/12/2018   ALKPHOS 102 01/12/2018   BILITOT 0.4 01/12/2018     Microbiology: Recent Results (from the past 240 hour(s))  Urine Culture     Status: None   Collection Time: 01/08/18  4:20 AM  Result Value Ref Range Status   Specimen Description   Final    URINE, CLEAN CATCH Performed at [facility], [address] [place], [place] [address]    Special Requests   Final    NONE Performed at [facility], [address], [place], [place] [address]    Culture   Final    NO GROWTH Performed at [facility], [place] [address], [place], [place] [address]    Report Status 01/09/2018 FINAL  Final    Culture, blood (Routine X 2) w Reflex to ID Panel     Status: None   Collection Time: 01/08/18  4:48 AM  Result Value Ref Range Status   Specimen Description BLOOD LEFT ARM  Final   Special Requests   Final    BOTTLES DRAWN AEROBIC AND ANAEROBIC Blood Culture results may not be optimal due to an inadequate volume of blood received in culture bottles   Culture   Final    NO GROWTH 5 DAYS Performed at [facility], [address], [place], [place] [address]    Report Status 01/13/2018 FINAL  Final  Culture, blood (Routine X 2) w Reflex to ID Panel     Status: None   Collection Time: 01/08/18  5:41 AM  Result Value Ref Range Status   Specimen Description BLOOD RIGHT ARM  Final   Special Requests   Final    BOTTLES DRAWN AEROBIC AND ANAEROBIC Blood Culture adequate volume   Culture   Final    NO GROWTH 5 DAYS Performed at [facility], [address], [place], [place] [address]    Report Status 01/13/2018 FINAL  Final   ASSESSMENT:  77yo M undergoing treatment for culture negative (@ [facility]) T9-T10 discitis and epidural abscess with Vancomycin + Ceftriaxone who started to develop rash and eosinophilia with transaminitis roughly the start of 3rd week of abtx regimen of vancomycin and ceftriaxone c/w DRESS syndrome. His antibiotics have been changed to daptomycin currently. DRESS more likely caused by vancomycin than cephalosporins but too difficult to tell since given at the same time. 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   Eosinophilia has resolved and transaminitis improving slowly. Currently projected end date for treatment 02/13/2018. He does have slight bump in WBC today - will add differential to be thorough however likely 2/2 steroids.   Wife would like neurosurgery team here at [facility] to evaluate as she would like care transferred to [facility] considering the travel burden to [facility]. MRI to be done today. Currently using [facility]. PICC line in LUE in place and clean/dry.    PLAN: 1. Continue Daptomycin 8mg /kg 2. MRI of thoracic shows persistence of abscess 3. Differential to AM CBC  4. Will add Levaquin today  5. May need some IVF - not sure how much he is drinking/eating and appears pretty dry today. Will defer to his primary team.   Janene Madeira, MSN, NP-C [facility] [place] Cell: [phone]  Pager: [phone]  01/14/2018  11:07 AM

## 2018-01-14 NOTE — Plan of Care (Signed)
  Problem: Education: Goal: Knowledge of General Education information will improve Outcome: Progressing   Problem: Pain Managment: Goal: General experience of comfort will improve Outcome: Progressing   

## 2018-01-14 NOTE — Progress Notes (Signed)
Physical Therapy Treatment Patient Details Name: Damon Parrish MRN: [id number] DOB: 01/16/1941 Today's Date: 01/14/2018    History of Present Illness 77 y.o. Male who initially presented to [facility] with complaints of lower back pain late last month. 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  He was empirically started on vancomycin and ceftriaxone and transferred to [facility] for further evaluation and treatment. Reportedly he had been at home on IV antibiotics for approximately 2 weeks.  He then started to develop fevers and generalized rash. His wife gave him an EpiPen dose without response.     PT Comments    Pt received in bed not willing to participate in gait.  He was exhausted from his MRI procedure earlier today.  Today's session focused on therapeutic exercise and bed mobility.  Reviewed spinal precautions with pt and wife.  Pt completed 2x log roll trials supine<->sidelying<->sit.  Pt will benefit from continuing POC to regain functional independence.     Follow Up Recommendations  Home health PT;Supervision/Assistance - 24 hour     Equipment Recommendations  None recommended by PT    Recommendations for Other Services OT consult     Precautions / Restrictions Precautions Precaution Comments: Educated pt and wife concerning back precautions related to ADL participation.  Restrictions Weight Bearing Restrictions: No    Mobility  Bed Mobility Overal bed mobility: Needs Assistance Bed Mobility: Rolling;Sidelying to Sit;Sit to Sidelying Rolling: Min guard Sidelying to sit: Min guard     Sit to sidelying: Min guard General bed mobility comments: Min guard assist for safety with cues for log roll technique.    Transfers                    Ambulation/Gait                 Stairs             Wheelchair Mobility    Modified Rankin  (Stroke Patients Only)       Balance                                            Cognition Arousal/Alertness: Awake/alert Behavior During Therapy: WFL for tasks assessed/performed Overall Cognitive Status: Within Functional Limits for tasks assessed                                        Exercises General Exercises - Lower Extremity Ankle Circles/Pumps: AROM;20 reps;Both;Supine Quad Sets: AROM;10 reps;Both;Sidelying Heel Slides: AROM;10 reps;Both;Supine Hip ABduction/ADduction: AROM;10 reps;Both;Supine Straight Leg Raises: AROM;Both;10 reps;Supine    General Comments        Pertinent Vitals/Pain Pain Assessment: 0-10 Pain Score: 4  Pain Location: back Pain Descriptors / Indicators: Aching;Constant;Other (Comment);Grimacing;Moaning Pain Intervention(s): Limited activity within patient's tolerance;Monitored during session    Home Living                      Prior Function            PT Goals (current goals can now be found in the care plan section) Acute Rehab PT Goals Patient Stated Goal: have less pain PT Goal Formulation: With patient/family Time For Goal Achievement: 01/24/18 Potential  to Achieve Goals: Fair Progress towards PT goals: Progressing toward goals    Frequency    Min 3X/week      PT Plan Current plan remains appropriate    Co-evaluation              AM-PAC PT "6 Clicks" Daily Activity  Outcome Measure  Difficulty turning over in bed (including adjusting bedclothes, sheets and blankets)?: Unable Difficulty moving from lying on back to sitting on the side of the bed? : Unable Difficulty sitting down on and standing up from a chair with arms (e.g., wheelchair, bedside commode, etc,.)?: Unable Help needed moving to and from a bed to chair (including a wheelchair)?: A Little Help needed walking in hospital room?: A Little Help needed climbing 3-5 steps with a railing? : A Lot 6 Click Score:  11    End of Session Equipment Utilized During Treatment: Gait belt Activity Tolerance: Patient limited by pain   Nurse Communication: Mobility status PT Visit Diagnosis: Pain;Other abnormalities of gait and mobility (R26.89);Difficulty in walking, not elsewhere classified (R26.2)     Time: 6237-6283 PT Time Calculation (min) (ACUTE ONLY): 16 min  Charges:  $Therapeutic Activity: 8-22 mins                    G Codes:       Terri Skains, SPTA [phone]    Terri Skains 01/14/2018, 4:50 PM

## 2018-01-15 DIAGNOSIS — R140 Abdominal distension (gaseous): Secondary | ICD-10-CM

## 2018-01-15 LAB — BASIC METABOLIC PANEL
Anion gap: 9 (ref 5–15)
BUN: 12 mg/dL (ref 6–20)
CALCIUM: 8.4 mg/dL — AB (ref 8.9–10.3)
CHLORIDE: 101 mmol/L (ref 101–111)
CO2: 22 mmol/L (ref 22–32)
CREATININE: 0.64 mg/dL (ref 0.61–1.24)
GFR calc Af Amer: >60 mL/min (ref >60)
GFR calc non Af Amer: >60 mL/min (ref >60)
Glucose, Bld: 95 mg/dL (ref 65–99)
Potassium: 3.6 mmol/L (ref 3.5–5.1)
Sodium: 132 mmol/L — ABNORMAL LOW (ref 135–145)

## 2018-01-15 LAB — CBC
HEMATOCRIT: 31 % — AB (ref 39.0–52.0)
HEMOGLOBIN: 10.7 g/dL — AB (ref 13.0–17.0)
MCH: 32.2 pg (ref 26.0–34.0)
MCHC: 34.5 g/dL (ref 30.0–36.0)
MCV: 93.4 fL (ref 78.0–100.0)
Platelets: 346 10*3/uL (ref 150–400)
RBC: 3.32 MIL/uL — ABNORMAL LOW (ref 4.22–5.81)
RDW: 14.3 % (ref 11.5–15.5)
WBC: 13.3 10*3/uL — ABNORMAL HIGH (ref 4.0–10.5)

## 2018-01-15 MED ORDER — PROSIGHT PO TABS
1.0000 | ORAL_TABLET | Freq: Every day | ORAL | Status: DC
  Administered 2018-01-16: 1 via ORAL
  Filled 2018-01-15: qty 1

## 2018-01-15 MED ORDER — ADULT MULTIVITAMIN W/MINERALS CH
1.0000 | ORAL_TABLET | Freq: Every day | ORAL | Status: DC
  Administered 2018-01-16: 1 via ORAL
  Filled 2018-01-15: qty 1

## 2018-01-15 NOTE — Plan of Care (Signed)
  Problem: Education: Goal: Knowledge of General Education information will improve Outcome: Progressing   Problem: Clinical Measurements: Goal: Ability to maintain clinical measurements within normal limits will improve Outcome: Progressing Goal: Will remain free from infection Outcome: Progressing Goal: Diagnostic test results will improve Outcome: Progressing Goal: Respiratory complications will improve Outcome: Progressing Goal: Cardiovascular complication will be avoided Outcome: Progressing   Problem: Activity: Goal: Risk for activity intolerance will decrease Outcome: Progressing   Problem: Nutrition: Goal: Adequate nutrition will be maintained Outcome: Progressing   Problem: Coping: Goal: Level of anxiety will decrease Outcome: Progressing   Problem: Elimination: Goal: Will not experience complications related to bowel motility Outcome: Progressing Goal: Will not experience complications related to urinary retention Outcome: Progressing   Problem: Pain Managment: Goal: General experience of comfort will improve Outcome: Progressing   Problem: Safety: Goal: Ability to remain free from injury will improve Outcome: Progressing   Problem: Skin Integrity: Goal: Risk for impaired skin integrity will decrease Outcome: Progressing   Problem: Education: Goal: Knowledge of General Education information will improve Outcome: Progressing   Problem: Health Behavior/Discharge Planning: Goal: Ability to manage health-related needs will improve Outcome: Progressing   Problem: Clinical Measurements: Goal: Ability to maintain clinical measurements within normal limits will improve Outcome: Progressing Goal: Will remain free from infection Outcome: Progressing Goal: Diagnostic test results will improve Outcome: Progressing Goal: Respiratory complications will improve Outcome: Progressing Goal: Cardiovascular complication will be avoided Outcome: Progressing    Problem: Activity: Goal: Risk for activity intolerance will decrease Outcome: Progressing   Problem: Nutrition: Goal: Adequate nutrition will be maintained Outcome: Progressing   Problem: Coping: Goal: Level of anxiety will decrease Outcome: Progressing   Problem: Elimination: Goal: Will not experience complications related to bowel motility Outcome: Progressing Goal: Will not experience complications related to urinary retention Outcome: Progressing   Problem: Pain Managment: Goal: General experience of comfort will improve Outcome: Progressing   Problem: Safety: Goal: Ability to remain free from injury will improve Outcome: Progressing   Problem: Skin Integrity: Goal: Risk for impaired skin integrity will decrease Outcome: Progressing

## 2018-01-15 NOTE — Progress Notes (Addendum)
[name] for Infectious Disease  Date of Admission:  01/08/2018   .  Total days of antibiotics    Patient ID: Damon Parrish is a 77 y.o. male with  Principal Problem:   Adverse drug reaction Active Problems:   Discitis   Epidural abscess   Osteomyelitis of thoracic spine (HCC)   Dermatitis, drug-induced   Pressure injury of skin   Allergic reaction   DRESS syndrome   . amLODipine  10 mg Oral Daily  . aspirin  325 mg Oral Daily  . brinzolamide  1 drop Both Eyes BID   And  . brimonidine  1 drop Both Eyes BID  . docusate sodium  100 mg Oral BID  . enoxaparin (LOVENOX) injection  40 mg Subcutaneous Q24H  . feeding supplement (ENSURE ENLIVE)  237 mL Oral BID BM  . fluticasone  1 spray Each Nare Daily  . latanoprost  1 drop Both Eyes QHS  . levofloxacin  500 mg Oral Daily  . loratadine  10 mg Oral Daily  . [START ON 01/16/2018] multivitamin  1 tablet Oral QHS  . [START ON 01/16/2018] multivitamin with minerals  1 tablet Oral QHS  . predniSONE  40 mg Oral Q breakfast  . sodium chloride flush  10-40 mL Intracatheter Q12H    SUBJECTIVE: Awake today. Reports he "does not feel well." Skin hurts when pressed and very itchy. He has not taken any hydroxyzine yet.    Allergies  Allergen Reactions  . Cephalosporins Rash  . Vancomycin Rash    OBJECTIVE: Vitals:   01/13/18 2022 01/14/18 0517 01/14/18 1930 01/15/18 0443  BP: 111/67 116/76 118/71 138/79  Pulse: 75 69 75 69  Resp: 18 18    Temp: 98.7 F (37.1 C) 97.9 F (36.6 C) 98.2 F (36.8 C) 98.1 F (36.7 C)  TempSrc: Oral Oral Oral Oral  SpO2: 99% 98% 97% 100%  Weight:      Height:       Body mass index is 25.82 kg/m.  Physical Exam  Constitutional: He is oriented to person, place, and time. He appears well-developed and well-nourished.  Resting in bed. Appears comfortable.    HENT:  Mouth/Throat: No oropharyngeal exudate.  Some flakes on top lip but mucous membranes otherwise intact.   Eyes:  Pupils are equal, round, and reactive to light. No scleral icterus.  Cardiovascular: Normal rate, regular rhythm, normal heart sounds and intact distal pulses.  No murmur heard. Pulmonary/Chest: Effort normal and breath sounds normal. No respiratory distress. He has no wheezes.  Abdominal: Soft. Bowel sounds are normal. He exhibits distension.  Musculoskeletal: Normal range of motion. He exhibits no edema.  Lymphadenopathy:    He has no cervical adenopathy.  Neurological: He is alert and oriented to person, place, and time.  Skin: Skin is warm and dry. Rash noted. There is erythema.  Generalized coalesced erythematous rash noted knees to head. Multiple areas of flaking skin noted to bilateral ears. No bullae/vesicles. Below the knees darker non-blanching red spotty rash c/w vasculitis appearance. Also noted to inside of arms/forearms.  Nursing note and vitals reviewed.  Lab Results Lab Results  Component Value Date   WBC 13.3 (H) 01/15/2018   HGB 10.7 (L) 01/15/2018   HCT 31.0 (L) 01/15/2018   MCV 93.4 01/15/2018   PLT 346 01/15/2018    Lab Results  Component Value Date   CREATININE 0.64 01/15/2018   BUN 12 01/15/2018   NA 132 (L)  01/15/2018   K 3.6 01/15/2018   CL 101 01/15/2018   CO2 22 01/15/2018    Lab Results  Component Value Date   ALT 233 (H) 01/12/2018   AST 94 (H) 01/12/2018   ALKPHOS 102 01/12/2018   BILITOT 0.4 01/12/2018     Microbiology: Recent Results (from the past 240 hour(s))  Urine Culture     Status: None   Collection Time: 01/08/18  4:20 AM  Result Value Ref Range Status   Specimen Description   Final    URINE, CLEAN CATCH Performed at [facility], [address], [place], [place] [address]    Special Requests   Final    NONE Performed at [facility], [address] [place], [place] [address]    Culture   Final    NO GROWTH Performed at [facility], [place] [address] [place], [place] [address]    Report Status 01/09/2018 FINAL  Final    Culture, blood (Routine X 2) w Reflex to ID Panel     Status: None   Collection Time: 01/08/18  4:48 AM  Result Value Ref Range Status   Specimen Description BLOOD LEFT ARM  Final   Special Requests   Final    BOTTLES DRAWN AEROBIC AND ANAEROBIC Blood Culture results may not be optimal due to an inadequate volume of blood received in culture bottles   Culture   Final    NO GROWTH 5 DAYS Performed at [facility], [address] [place], [place] [address]    Report Status 01/13/2018 FINAL  Final  Culture, blood (Routine X 2) w Reflex to ID Panel     Status: None   Collection Time: 01/08/18  5:41 AM  Result Value Ref Range Status   Specimen Description BLOOD RIGHT ARM  Final   Special Requests   Final    BOTTLES DRAWN AEROBIC AND ANAEROBIC Blood Culture adequate volume   Culture   Final    NO GROWTH 5 DAYS Performed at [facility], [address], [place],  [address]    Report Status 01/13/2018 FINAL  Final   ASSESSMENT:  77yo M undergoing treatment for culture negative T9-T10 discitis and epidural abscess with Vancomycin + Ceftriaxone who develop rash after roughly 3 weeks of administration. Found to have eosinophilia and transaminitis c/w DRESS syndrome. His antibiotics have been changed to daptomycin with Levaquin. DRESS more likely caused by vancomycin than cephalosporins but too difficult to tell since given at the same time. Rash improving slowly but outpatient dermatology follow up recommended.   Mr. Turnage' wife would like neurosurgery team here at [facility] to evaluate as she would like care transferred to [facility] considering the travel burden to [facility]. Currently using [facility]. PICC line in LUE in place and clean/dry.    Eosinophilia present yesterday. No LFTs drawn - would check and follow trend. Currently projected end date for treatment 02/13/2018.   PLAN: 1. Continue Daptomycin 8mg /kg and Levaquin 500 mg QD through 02/13/18 2. MRI with abscess -  neurosurgery eval pending 3. Steroids per primary team - will need taper in the future 4. Rash likely to take 6+ weeks to resolve completely  Janene Madeira, MSN, NP-C [facility] for Infectious [place] Cell: [phone] Pager: [phone]  01/15/2018  11:07 AM   Addendum  I spoke with Neurosurgery, they did not feel that he needs surgery, that his scans are improved. They suggest continued ID and medical f/u.

## 2018-01-15 NOTE — Progress Notes (Signed)
Patient ID: Damon Parrish, male   DOB: 05/02/1941, 77 y.o.   MRN: [id number]  PROGRESS NOTE    AGUSTUS MANE  GDJ:[id number] DOB: 1941/04/27 DOA: 01/08/2018  PCP: Tobe Sos, MD   Brief Narrative:  77 y.o. male who[facility]ially [facility] with complaints of lower back painlate last month. 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 He was empirically started on vancomycin and ceftriaxone and transferred to [facility] health for further evaluation and treatment. IR was consulted for a bone biopsy. He was continued on vancomycin and ceftriaxone. Apparently the blood and biopsy results were without growth. Infectious disease evaluated the patient and recommended continuing him on vancomycin and ceftriaxone for a total of 8 weeks with the end date being 02/13/2018. The patient had been subsequently discharged home after that with a home PICC line. He was also supposed to have some repeat imaging in about 8 weeks. He had follow-up with infectious disease and orthopedics arrange for outpatient management. Reportedly he had been at home on IV antibiotics for approximately 2 weeks. He then started to develop fevers and generalized rash. His wife gave him an EpiPen dose without response. Started on dapto.   Assessment & Plan:   Principal Problem:   Adverse drug reaction - Secondary to vanco - Continue to monitor - Improving slightly    Active Problems:   Epidural abscess / Osteomyelitis of thoracic spine (HCC) / Leukocytosis - Continue daptomycin  - MRI done persistent T9-10 discitis osteomyelitis with interval increase deformity of the opposing endplates and loss of T9 vertebral body height. Rim enhancing collection within the intervertebral disc space likely represents an organized abscess.  Decreased epidural edema and resolved epidural fluid collection at the T9 and T10  levels. No residual cord impingement.  No new acute osseous abnormality identified.     Hyponatremia - Likely due to dehydration or acute infectious process - Sodium improved     Abnormal LFT's - Possibly from statin therapy  - LFT's gradually trending down - ALP and bilirubin are WNL   DVT prophylaxis: Lovenox subQ Code Status: DNR/DNI Family Communication: wife at bedside Disposition Plan: home once rash improves    Consultants:   ID  Procedures:   None   Antimicrobials:   Daptomycin 4/18 -->   Subjective: No overnight events.  Objective: Vitals:   01/13/18 2022 01/14/18 0517 01/14/18 1930 01/15/18 0443  BP: 111/67 116/76 118/71 138/79  Pulse: 75 69 75 69  Resp: 18 18    Temp: 98.7 F (37.1 C) 97.9 F (36.6 C) 98.2 F (36.8 C) 98.1 F (36.7 C)  TempSrc: Oral Oral Oral Oral  SpO2: 99% 98% 97% 100%  Weight:      Height:       No intake or output data in the 24 hours ending 01/15/18 0919 Filed Weights   01/08/18 0413  Weight: 72.6 kg (160 lb)     Physical Exam  Constitutional: Appears well-developed and well-nourished. No distress.  HENT: Normocephalic. External right and left ear normal. Oropharynx is clear and moist.  Eyes: Conjunctivae and EOM are normal. PERRLA, no scleral icterus.  Neck: Normal ROM. Neck supple. No JVD. No tracheal deviation. No thyromegaly.  CVS: RRR, S1/S2 + Pulmonary: Effort and breath sounds normal, no stridor, rhonchi, wheezes, rales.  Abdominal: Soft. BS +,  no distension, tenderness, rebound or guarding.  Musculoskeletal: Normal range of motion. No edema and no tenderness.  Lymphadenopathy: No lymphadenopathy noted, cervical, inguinal. Neuro: Alert. Normal reflexes, muscle tone coordination. No cranial nerve deficit. Skin: generalized rash, seems to be better in past 48 hours  Psychiatric: Normal mood and affect. Behavior, judgment, thought content normal.     Data Reviewed: I have personally reviewed following labs  and imaging studies  CBC: Recent Labs  Lab 01/09/18 0855 01/11/18 0431 01/12/18 0446 01/14/18 0343 01/15/18 0500  WBC 9.2 7.6 5.7 12.0* 13.3*  NEUTROABS 6.8 3.1 2.8 4.5  --   HGB 12.5* 11.1* 10.1* 9.9* 10.7*  HCT 36.6* 32.2* 28.7* 28.8* 31.0*  MCV 92.7 92.8 92.0 93.5 93.4  PLT 228 272 259 286 546   Basic Metabolic Panel: Recent Labs  Lab 01/09/18 0855 01/11/18 0431 01/12/18 0446 01/14/18 0343 01/15/18 0500  NA 127* 125* 125* 130* 132*  K 3.5 3.3* 4.1 4.0 3.6  CL 94* 97* 97* 100* 101  CO2 21* 20* 21* 21* 22  GLUCOSE 150* 101* 149* 96 95  BUN 21* 16 15 16 12   CREATININE 0.71 0.79 0.67 0.68 0.64  CALCIUM 8.3* 7.5* 7.7* 8.0* 8.4*   GFR: Estimated Creatinine Clearance: 69.8 mL/min (by C-G formula based on SCr of 0.64 mg/dL). Liver Function Tests: Recent Labs  Lab 01/09/18 0855 01/10/18 0732 01/11/18 0431 01/12/18 0446  AST 247* 147* 134* 94*  ALT 433* 306* 277* 233*  ALKPHOS 103 82 82 102  BILITOT 0.6 0.6 0.6 0.4  PROT 5.5* 4.5* 4.2* 4.1*  ALBUMIN 2.7* 2.2* 2.1* 2.1*   No results for input(s): LIPASE, AMYLASE in the last 168 hours. No results for input(s): AMMONIA in the last 168 hours. Coagulation Profile: Recent Labs  Lab 01/10/18 0732 01/11/18 0431 01/12/18 0446  INR 1.46 1.45 1.44   Cardiac Enzymes: Recent Labs  Lab 01/09/18 1100  CKTOTAL 25*   BNP (last 3 results) No results for input(s): PROBNP in the last 8760 hours. HbA1C: No results for input(s): HGBA1C in the last 72 hours. CBG: No results for input(s): GLUCAP in the last 168 hours. Lipid Profile: No results for input(s): CHOL, HDL, LDLCALC, TRIG, CHOLHDL, LDLDIRECT in the last 72 hours. Thyroid Function Tests: No results for input(s): TSH, T4TOTAL, FREET4, T3FREE, THYROIDAB in the last 72 hours. Anemia Panel: No results for input(s): VITAMINB12, FOLATE, FERRITIN, TIBC, IRON, RETICCTPCT in the last 72 hours. Urine analysis:    Component Value Date/Time   COLORURINE AMBER (A)  01/08/2018 0419   APPEARANCEUR CLOUDY (A) 01/08/2018 0419   LABSPEC 1.027 01/08/2018 0419   PHURINE 6.0 01/08/2018 0419   GLUCOSEU NEGATIVE 01/08/2018 0419   HGBUR NEGATIVE 01/08/2018 0419   BILIRUBINUR NEGATIVE 01/08/2018 0419   KETONESUR 5 (A) 01/08/2018 0419   PROTEINUR 100 (A) 01/08/2018 0419   UROBILINOGEN 0.2 05/04/2015 1043   NITRITE NEGATIVE 01/08/2018 0419   LEUKOCYTESUR NEGATIVE 01/08/2018 0419   Sepsis Labs: @LABRCNTIP (procalcitonin:4,lacticidven:4)   Urine Culture     Status: None   Collection Time: 01/08/18  4:20 AM  Result Value Ref Range Status   Specimen Description   Final    URINE, CLEAN CATCH Performed at [facility], [address], [place], [place] [address]    Special Requests   Final    NONE Performed at [facility], [address] [place], [place] [address]    Culture   Final    NO GROWTH    Report Status 01/09/2018 FINAL  Final  Culture, blood (Routine X 2) w Reflex to ID Panel     Status: None (Preliminary  result)   Collection Time: 01/08/18  4:48 AM  Result Value Ref Range Status   Specimen Description BLOOD LEFT ARM  Final   Special Requests   Final    BOTTLES DRAWN AEROBIC AND ANAEROBIC Blood Culture results may not be optimal due to an inadequate volume of blood received in culture bottles   Culture   Final    NO GROWTH 2 DAYS    Report Status PENDING  Incomplete  Culture, blood (Routine X 2) w Reflex to ID Panel     Status: None (Preliminary result)   Collection Time: 01/08/18  5:41 AM  Result Value Ref Range Status   Specimen Description BLOOD RIGHT ARM  Final   Special Requests   Final    BOTTLES DRAWN AEROBIC AND ANAEROBIC Blood Culture adequate volume   Culture   Final    NO GROWTH 2 DAYS Performed at [facility], [address], [place], [place] [address]    Report Status PENDING  Incomplete      Radiology Studies: Dg Chest Port 1 View Result Date: 01/09/2018 1. Left PICC terminates over the lower SVC. 2. Clear  lungs.  Dg Chest Port 1 View Result Date: 01/08/2018 1. No radiographic evidence for active cardiopulmonary disease. 2. Aortic atherosclerosis.   US Abdomen Limited Ruq Result Date: 01/08/2018 Layering sludge and/or small stones within the gallbladder. No wall thickening or evidence of acute cholecystitis.     Scheduled Meds: . amLODipine  10 mg Oral Daily  . aspirin  325 mg Oral Daily  . docusate sodium  100 mg Oral BID  . enoxaparin   40 mg Subcutaneous Q24H   Continuous Infusions: . DAPTOmycin (CUBICIN)  IV Stopped (01/14/18 1259)     LOS: 6 days    Time spent: 25 minutes  Greater than 50% of the time spent on counseling and coordinating the care.   Leisa Lenz, MD [facility] Pager [phone]  If 7PM-7AM, please contact night-coverage [email or web address] Password [facility] 01/15/2018, 9:19 AM

## 2018-01-15 NOTE — Progress Notes (Signed)
Physical Therapy Treatment Patient Details Name: KIJANA CROMIE MRN: [id number] DOB: [birth year] Today's Date: 01/15/2018    History of Present Illness Pt is a 77 y.o. Male who initially presented to [facility] with complaints of lower back pain late last month. 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  He was empirically started on vancomycin and ceftriaxone and transferred to [facility] health for further evaluation and treatment. Reportedly he had been at home on IV antibiotics for approximately 2 weeks.  He then started to develop fevers and generalized rash. His wife gave him an EpiPen dose without response.     PT Comments    Pt making steady progress with functional mobility and tolerated increased distance ambulated this session. Pt continues to require cueing for back precautions and log roll technique. Pt's wife present throughout and very attentive. Pt would continue to benefit from skilled physical therapy services at this time while admitted and after d/c to address the below listed limitations in order to improve overall safety and independence with functional mobility.    Follow Up Recommendations  Home health PT;Supervision/Assistance - 24 hour     Equipment Recommendations  None recommended by PT    Recommendations for Other Services       Precautions / Restrictions Precautions Precautions: Back Precaution Comments: reviewed log roll technique, wife with good recall Restrictions Weight Bearing Restrictions: No    Mobility  Bed Mobility Overal bed mobility: Needs Assistance Bed Mobility: Rolling;Sidelying to Sit;Sit to Sidelying Rolling: Supervision Sidelying to sit: Min guard     Sit to sidelying: Min assist General bed mobility comments: cueing for log roll technique, assist with return of bilateral LEs onto bed  Transfers Overall transfer level: Needs  assistance Equipment used: Rolling walker (2 wheeled) Transfers: Sit to/from Stand Sit to Stand: Min guard         General transfer comment: increased time and effort, cueing for safe hand placement, min guard for safety  Ambulation/Gait Ambulation/Gait assistance: Min guard Ambulation Distance (Feet): 500 Feet Assistive device: Rolling walker (2 wheeled) Gait Pattern/deviations: Step-through pattern;Decreased stride length;Drifts right/left Gait velocity: decreased Gait velocity interpretation: <1.31 ft/sec, indicative of household ambulator General Gait Details: pt steady with RW, grimacing throughout secondary to back pain; pt safe with RW and min guard   Stairs             Wheelchair Mobility    Modified Rankin (Stroke Patients Only)       Balance Overall balance assessment: Needs assistance Sitting-balance support: No upper extremity supported;Feet supported;Bilateral upper extremity supported Sitting balance-Leahy Scale: Good     Standing balance support: During functional activity;Single extremity supported;Bilateral upper extremity supported Standing balance-Leahy Scale: Poor                              Cognition Arousal/Alertness: Awake/alert Behavior During Therapy: WFL for tasks assessed/performed Overall Cognitive Status: Within Functional Limits for tasks assessed                                        Exercises      General Comments        Pertinent Vitals/Pain Pain Assessment: Faces Faces Pain Scale: Hurts even more Pain Location: back Pain Descriptors / Indicators: Sore;Grimacing;Guarding Pain Intervention(s): Repositioned;Monitored during session  Home Living                      Prior Function            PT Goals (current goals can now be found in the care plan section) Acute Rehab PT Goals PT Goal Formulation: With patient/family Time For Goal Achievement: 01/24/18 Potential to  Achieve Goals: Good Progress towards PT goals: Progressing toward goals    Frequency    Min 3X/week      PT Plan Current plan remains appropriate    Co-evaluation              AM-PAC PT "6 Clicks" Daily Activity  Outcome Measure  Difficulty turning over in bed (including adjusting bedclothes, sheets and blankets)?: A Lot Difficulty moving from lying on back to sitting on the side of the bed? : A Lot Difficulty sitting down on and standing up from a chair with arms (e.g., wheelchair, bedside commode, etc,.)?: Unable Help needed moving to and from a bed to chair (including a wheelchair)?: None Help needed walking in hospital room?: A Little Help needed climbing 3-5 steps with a railing? : A Little 6 Click Score: 15    End of Session Equipment Utilized During Treatment: Gait belt Activity Tolerance: Patient limited by pain Patient left: in bed;with call bell/phone within reach;with family/visitor present Nurse Communication: Mobility status PT Visit Diagnosis: Pain;Other abnormalities of gait and mobility (R26.89);Difficulty in walking, not elsewhere classified (R26.2) Pain - part of body: (back)     Time: 3662-9476 PT Time Calculation (min) (ACUTE ONLY): 15 min  Charges:  $Gait Training: 8-22 mins                    G Codes:       [place], [place], [place] Elsie 01/15/2018, 3:35 PM

## 2018-01-16 LAB — CK: Total CK: 21 U/L — ABNORMAL LOW (ref 49–397)

## 2018-01-16 LAB — CREATININE, SERUM
Creatinine, Ser: 0.44 mg/dL — ABNORMAL LOW (ref 0.61–1.24)
GFR calc Af Amer: >60 mL/min (ref >60)

## 2018-01-16 NOTE — Progress Notes (Signed)
PHARMACY CONSULT NOTE FOR:  OUTPATIENT  PARENTERAL ANTIBIOTIC THERAPY (OPAT)  Indication: Culture negative discitis and osteomyelitis Regimen: Daptomycin 580mg  IV Q24h and Levaquin 500mg  PO Q24h End date: 02/13/18  IV antibiotic discharge orders are pended. To discharging provider:  please sign these orders via discharge navigator,  Select New Orders & click on the button choice - Manage This Unsigned Work.     Thank you for allowing pharmacy to be a part of this patient's care.  [name] 01/16/2018, 11:01 AM

## 2018-01-16 NOTE — Progress Notes (Signed)
Occupational Therapy Treatment Patient Details Name: Damon Parrish MRN: [id number] DOB: April 26, 1941 Today's Date: 01/16/2018    History of present illness Pt is a 77 y.o. Male who initially presented to [facility] with complaints of lower back pain late last month. 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  He was empirically started on vancomycin and ceftriaxone and transferred to [facility] health for further evaluation and treatment. Reportedly he had been at home on IV antibiotics for approximately 2 weeks.  He then started to develop fevers and generalized rash. His wife gave him an EpiPen dose without response.    OT comments  Pt demonstrates basic bed mobility, LB dressing with mod (A) and toilet transfer min (A) during session. Pt and wife very concerned with pending d/c to the infection specialist that will require 2 hours drive from their home. Question if patient could lay flat in the back seat for the right. Wife says "im not sure"    Follow Up Recommendations  Home health OT;Supervision/Assistance - 24 hour    Equipment Recommendations  3 in 1 bedside commode    Recommendations for Other Services      Precautions / Restrictions Precautions Precautions: Back Precaution Comments: reviewed using back precautions and also help with pain management       Mobility Bed Mobility Overal bed mobility: Needs Assistance Bed Mobility: Rolling Rolling: Supervision   Supine to sit: Supervision     General bed mobility comments: cues for sequecnes and able to complete. pt states "that wasnt too bad."   Transfers Overall transfer level: Needs assistance Equipment used: Rolling walker (2 wheeled) Transfers: Sit to/from Stand Sit to Stand: Min assist         General transfer comment: (A) to power up into standing completed x3 during session    Balance Overall balance  assessment: Needs assistance Sitting-balance support: Bilateral upper extremity supported;Feet supported Sitting balance-Leahy Scale: Fair     Standing balance support: During functional activity Standing balance-Leahy Scale: Poor                             ADL either performed or assessed with clinical judgement   ADL Overall ADL's : Needs assistance/impaired     Grooming: Supervision/safety;Standing Grooming Details (indicate cue type and reason): pt washing hands and grimace due to bil UE use withoiut support             Lower Body Dressing: Moderate assistance Lower Body Dressing Details (indicate cue type and reason): don pants and don socks . pt requires socks started and able to figure 4 cross supine to pull the rest of the way up Toilet Transfer: Minimal assistance;Regular Toilet;RW Toilet Transfer Details (indicate cue type and reason): pt static standing at commode to void bladder         Functional mobility during ADLs: Minimal assistance;Rolling walker General ADL Comments: pt complete dbed mobility, sink level grooming and toilet static standing transfer.      Vision       Perception     Praxis      Cognition Arousal/Alertness: Awake/alert Behavior During Therapy: WFL for tasks assessed/performed Overall Cognitive Status: Within Functional Limits for tasks assessed  Exercises     Shoulder Instructions       General Comments pt reports they say my rash is better but i dont think so     Pertinent Vitals/ Pain       Pain Assessment: Faces Faces Pain Scale: Hurts a little bit Pain Location: back Pain Descriptors / Indicators: Sore;Grimacing Pain Intervention(s): Repositioned;Premedicated before session;Monitored during session  Home Living                                          Prior Functioning/Environment              Frequency  Min 2X/week         Progress Toward Goals  OT Goals(current goals can now be found in the care plan section)  Progress towards OT goals: Progressing toward goals  Acute Rehab OT Goals Patient Stated Goal: have less pain OT Goal Formulation: With patient/family Time For Goal Achievement: 01/25/18 Potential to Achieve Goals: Good ADL Goals Pt Will Perform Lower Body Bathing: with modified independence;with caregiver independent in assisting;with adaptive equipment;sit to/from stand Pt Will Perform Lower Body Dressing: with modified independence;with adaptive equipment;sitting/lateral leans Pt Will Transfer to Toilet: with supervision;ambulating Pt Will Perform Toileting - Clothing Manipulation and hygiene: with modified independence;with adaptive equipment;sit to/from stand Additional ADL Goal #1: Pt will perform bed mobility with back precaution method at mod I level as precusor to participation in ADL activity  Plan Discharge plan remains appropriate    Co-evaluation                 AM-PAC PT "6 Clicks" Daily Activity     Outcome Measure   Help from another person eating meals?: None Help from another person taking care of personal grooming?: A Little Help from another person toileting, which includes using toliet, bedpan, or urinal?: A Little Help from another person bathing (including washing, rinsing, drying)?: A Lot Help from another person to put on and taking off regular upper body clothing?: A Little Help from another person to put on and taking off regular lower body clothing?: A Little 6 Click Score: 18    End of Session Equipment Utilized During Treatment: Rolling walker  OT Visit Diagnosis: Unsteadiness on feet (R26.81);Other abnormalities of gait and mobility (R26.89);Muscle weakness (generalized) (M62.81)   Activity Tolerance Patient tolerated treatment well   Patient Left in bed;with call bell/phone within reach;with family/visitor present   Nurse Communication  Mobility status        Time: 8341-9622 OT Time Calculation (min): 21 min  Charges: OT General Charges $OT Visit: 1 Visit OT Treatments $Self Care/Home Management : 8-22 mins   Anurag, Scarfo   OTR/L Pager: [phone] Office: [phone] .    Parke Poisson B 01/16/2018, 3:33 PM

## 2018-01-16 NOTE — Progress Notes (Signed)
Pharmacy Antibiotic Note  Damon Parrish is a 77 y.o. male admitted on 01/08/2018 with osteomyelitis.  Pharmacy has been consulted for Daptomycin dosing.   T9-10 cx negative discitis/osteo. Was on vanc and ceftriaxone PTA but had severe skin reaction over whole body. Allergies added. Added PO Levaquin for GNR coverage. Afebrile, WBC 13.3  Plan: Cubicin 580 mg IV Q24H (~ 8 mg/kg) Levaquin 500mg  PO Q24h CK q-Fri and note (once/week note)  Planned stop date of 5/24  Height: 5\' 6"  (167.6 cm) Weight: 160 lb (72.6 kg) IBW/kg (Calculated) : 63.8  Temp (24hrs), Avg:98.1 F (36.7 C), Min:97.8 F (36.6 C), Max:98.5 F (36.9 C)  Recent Labs  Lab 01/11/18 0431 01/12/18 0446 01/14/18 0343 01/15/18 0500 01/16/18 0420  WBC 7.6 5.7 12.0* 13.3*  --   CREATININE 0.79 0.67 0.68 0.64 0.44*    Estimated Creatinine Clearance: 69.8 mL/min (A) (by C-G formula based on SCr of 0.44 mg/dL (L)).    Allergies  Allergen Reactions  . Cephalosporins Rash    Full body rash with systemic symptoms  . Vancomycin Rash    Full body rash with systemic symptoms   Antimicrobials this admission: Daptomycin 4/19 >>  Levaquin 4/24 >>   Thank you for allowing pharmacy to be a part of this patient's care.  [name] 01/16/2018 10:37 AM

## 2018-01-16 NOTE — Progress Notes (Signed)
INFECTIOUS DISEASE PROGRESS NOTE  ID: Damon Parrish is a 77 y.o. male with  Principal Problem:   Adverse drug reaction Active Problems:   Discitis   Epidural abscess   Osteomyelitis of thoracic spine (HCC)   Dermatitis, drug-induced   Pressure injury of skin   Allergic reaction   DRESS syndrome  Subjective: Feels that rash is better Ambulated this AM.   Abtx:  Anti-infectives (From admission, onward)   Start     Dose/Rate Route Frequency Ordered Stop   01/14/18 1615  levofloxacin (LEVAQUIN) tablet 500 mg     500 mg Oral Daily 01/14/18 1605 02/13/18 2359   01/09/18 1200  DAPTOmycin (CUBICIN) 580 mg in sodium chloride 0.9 % IVPB     580 mg 223.2 mL/hr over 30 Minutes Intravenous Every 24 hours 01/09/18 1110 02/13/18 2359      Medications:  Scheduled: . amLODipine  10 mg Oral Daily  . aspirin  325 mg Oral Daily  . brinzolamide  1 drop Both Eyes BID   And  . brimonidine  1 drop Both Eyes BID  . docusate sodium  100 mg Oral BID  . enoxaparin (LOVENOX) injection  40 mg Subcutaneous Q24H  . feeding supplement (ENSURE ENLIVE)  237 mL Oral BID BM  . fluticasone  1 spray Each Nare Daily  . latanoprost  1 drop Both Eyes QHS  . levofloxacin  500 mg Oral Daily  . loratadine  10 mg Oral Daily  . multivitamin  1 tablet Oral QHS  . multivitamin with minerals  1 tablet Oral QHS  . predniSONE  40 mg Oral Q breakfast  . sodium chloride flush  10-40 mL Intracatheter Q12H    Objective: Vital signs in last 24 hours: Temp:  [97.8 F (36.6 C)-98.5 F (36.9 C)] 97.8 F (36.6 C) (04/26 0532) Pulse Rate:  [65-67] 65 (04/26 0532) Resp:  [14-18] 14 (04/26 0532) BP: (122-131)/(68-84) 131/84 (04/26 0532) SpO2:  [98 %-100 %] 100 % (04/26 0532)   General appearance: alert, cooperative and no distress Back: no tenderness to percussion or palpation Skin: decreased rash on upper extr, trunk.   Lab Results Recent Labs    01/14/18 0343 01/15/18 0500 01/16/18 0420  WBC 12.0* 13.3*   --   HGB 9.9* 10.7*  --   HCT 28.8* 31.0*  --   NA 130* 132*  --   K 4.0 3.6  --   CL 100* 101  --   CO2 21* 22  --   BUN 16 12  --   CREATININE 0.68 0.64 0.44*   Liver Panel No results for input(s): PROT, ALBUMIN, AST, ALT, ALKPHOS, BILITOT, BILIDIR, IBILI in the last 72 hours. Sedimentation Rate No results for input(s): ESRSEDRATE in the last 72 hours. C-Reactive Protein No results for input(s): CRP in the last 72 hours.  Microbiology: Recent Results (from the past 240 hour(s))  Urine Culture     Status: None   Collection Time: 01/08/18  4:20 AM  Result Value Ref Range Status   Specimen Description   Final    URINE, CLEAN CATCH Performed at [facility], [address] [place], [place] [address]    Special Requests   Final    NONE Performed at [facility], [address], [place], [place] [address]    Culture   Final    NO GROWTH Performed at [facility], [place] [address] [place],  [address]    Report Status 01/09/2018 FINAL  Final  Culture, blood (Routine X 2) w Reflex to ID Panel     Status: None   Collection Time: 01/08/18  4:48 AM  Result Value Ref Range Status   Specimen Description BLOOD LEFT ARM  Final   Special Requests   Final    BOTTLES DRAWN AEROBIC AND ANAEROBIC Blood Culture results may not be optimal due to an inadequate volume of blood received in culture bottles   Culture   Final    NO GROWTH 5 DAYS Performed at [facility], [address] [place], [place] [address]    Report Status 01/13/2018 FINAL  Final  Culture, blood (Routine X 2) w Reflex to ID Panel     Status: None   Collection Time: 01/08/18  5:41 AM  Result Value Ref Range Status   Specimen Description BLOOD RIGHT ARM  Final   Special Requests   Final    BOTTLES DRAWN AEROBIC AND ANAEROBIC Blood Culture adequate volume   Culture   Final    NO GROWTH 5 DAYS Performed at [facility], [address] [place], [place] [address]    Report Status 01/13/2018 FINAL  Final     Studies/Results: Mr Thoracic Spine W Wo Contrast  Result Date: 01/14/2018 CLINICAL DATA:  77 y/o M; history of discitis osteomyelitis for follow-up. EXAM: MRI THORACIC WITHOUT AND WITH CONTRAST TECHNIQUE: Multiplanar and multiecho pulse sequences of the thoracic spine were obtained without and with intravenous contrast. CONTRAST:  50m MULTIHANCE GADOBENATE DIMEGLUMINE 529 MG/ML IV SOLN COMPARISON:  12/17/2017 thoracic spine MRI. FINDINGS: MRI THORACIC SPINE FINDINGS Alignment:  Physiologic. Vertebrae: Persistent diffuse enhancement of the T9 and T10 vertebral bodies. Interval increased irregularity of the opposing endplates and loss of height of the T9 vertebral body. Rim enhancing collection within the intervertebral disc space measuring 24 x 15 x 10 mm likely representing an organized abscess (series 10, image 32 and series 11, image 7). Is mild persistent enhancement and thickening of the anterior epidural space at the T9 and T10 levels, but much decreased in the prior study and the fluid collection is resolved as well as the cord impingement. No new abnormal signal of vertebral bodies or intervertebral discs. Cord:  Normal signal and morphology. Paraspinal and other soft tissues: Paravertebral edema and enhancement at the T9 and T10 levels. Disc levels: Multilevel disc desiccation with small disc bulges resulting in ventral thecal sac effacement from T3 through T10. Additionally, facet hypertrophy results in bony foraminal stenosis at the T1-2 level. IMPRESSION: 1. Persistent T9-10 discitis osteomyelitis with interval increase deformity of the opposing endplates and loss of T9 vertebral body height. Rim enhancing collection within the intervertebral disc space likely represents an organized abscess. 2. Decreased epidural edema and resolved epidural fluid collection at the T9 and T10 levels. No residual cord impingement. 3. No new acute osseous abnormality identified. Electronically Signed   By: LKristine GarbeM.D.   On: 01/14/2018 15:06     Assessment/Plan: T9-T10 discitis and [facility]ural abscess [facility]  ADR, DRESS (vanco vs ceftriaxone)  Daptomycin 4/19 >>  Levaquin 4/24 >>  He will f/u in ID clinic in 1 month [facility] will continue his current anbx.    Allergies  Allergen Reactions  . Cephalosporins Rash    Full body rash with systemic symptoms  . Vancomycin Rash    Full body rash with systemic symptoms    OPAT Orders Discharge antibiotics: dapto, levaquin '500mg'$  daily Per pharmacy protocol dapto  Duration: 8 weeks.  End Date: 02-13-18  [facility] Per Protocol: please  Labs weekly while on IV antibiotics: _x_ CBC with differential __ BMP _x_ CMP __ CRP __ ESR __ Vancomycin trough _x_ CK  __ Please pull PIC at completion of IV antibiotics _x_ Please leave PIC in place until doctor has seen patient or been notified  Fax weekly labs to [phone]  Clinic Follow Up Appt: 1 month         Bobby Rumpf MD, FACP Infectious Diseases (pager) [phone] [email or web address] 01/16/2018, 10:40 AM  LOS: 7 days

## 2018-01-16 NOTE — Progress Notes (Signed)
Patient ID: Damon Parrish, male   DOB: 1940/11/30, 77 y.o.   MRN: [id number]  PROGRESS NOTE    Damon Parrish  WER:[id number] DOB: August 28, 1941 DOA: 01/08/2018  PCP: Tobe Sos, MD   Brief Narrative:  77 y.o. male who[facility] with complaints of lower back painlate last month. 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 He was empirically started on vancomycin and ceftriaxone and transferred to [facility] health for further evaluation and treatment. IR was consulted for a bone biopsy. He was continued on vancomycin and ceftriaxone. Apparently the blood and biopsy results were without growth. Infectious disease evaluated the patient and recommended continuing him on vancomycin and ceftriaxone for a total of 8 weeks with the end date being 02/13/2018. The patient had been subsequently discharged home after that with a home PICC line. He was also supposed to have some repeat imaging in about 8 weeks. He had follow-up with infectious disease and orthopedics arrange for outpatient management. Reportedly he had been at home on IV antibiotics for approximately 2 weeks. He then started to develop fevers and generalized rash. His wife gave him an EpiPen dose without response. Started on dapto.   Assessment & Plan:   Principal Problem:   Adverse drug reaction - Secondary to vanco - Improving slowly    Active Problems:   Epidural abscess / Osteomyelitis of thoracic spine (HCC) / Leukocytosis - MRI done persistent T9-10 discitis osteomyelitis with interval increase deformity of the opposing endplates and loss of T9 vertebral body height. Rim enhancing collection within the intervertebral disc space likely represents an organized abscess.  Decreased epidural edema and resolved epidural fluid collection at the T9 and T10 levels. No residual cord impingement.  No new acute  osseous abnormality identified.  - Will try to get in touch with neurosx again to see if anything that can be done for pain - Continue dapto and per ID, added Levaquin     Hyponatremia - Likely due to dehydration or acute infectious process - Sodium improved     Abnormal LFT's - Possibly from statin therapy  - LFT's gradually trending down - ALP and bilirubin are WNL   DVT prophylaxis: Lovenox subQ Code Status: DNR/DNI Family Communication: wife at bedside  Disposition Plan: home once rash improves     Consultants:   ID  Procedures:   None   Antimicrobials:   Daptomycin 4/18 -->  Levaquin 4/26 -->   Subjective: No overnight events.  Objective: Vitals:   01/15/18 0443 01/15/18 1500 01/15/18 2044 01/16/18 0532  BP: 138/79 122/68 125/73 131/84  Pulse: 69 66 67 65  Resp:  18 18 14   Temp: 98.1 F (36.7 C) 98 F (36.7 C) 98.5 F (36.9 C) 97.8 F (36.6 C)  TempSrc: Oral Oral Oral Oral  SpO2: 100% 98% 99% 100%  Weight:      Height:        Intake/Output Summary (Last 24 hours) at 01/16/2018 1133 Last data filed at 01/16/2018 0756 Gross per 24 hour  Intake 240 ml  Output -  Net 240 ml   Filed Weights   01/08/18 0413  Weight: 72.6 kg (160 lb)    Physical Exam  Constitutional: Appears well-developed and well-nourished. No distress.  HENT: Normocephalic. External right and left ear normal. Oropharynx is clear and moist.  Eyes: Conjunctivae and EOM are normal. PERRLA, no scleral icterus.  Neck: Normal ROM. Neck supple.  No JVD. No tracheal deviation. No thyromegaly.  CVS: RRR, S1/S2 +, no murmurs, no gallops, no carotid bruit.  Pulmonary: Effort and breath sounds normal, no stridor, rhonchi, wheezes, rales.  Abdominal: Soft. BS +,  no distension, tenderness, rebound or guarding.  Musculoskeletal: Normal range of motion. No edema and no tenderness.  Lymphadenopathy: No lymphadenopathy noted, cervical, inguinal. Neuro: Alert. Normal reflexes, muscle tone  coordination. No cranial nerve deficit. Skin: Generalized rash improving   Psychiatric: Normal mood and affect. Behavior, judgment, thought content normal.     Data Reviewed: I have personally reviewed following labs and imaging studies  CBC: Recent Labs  Lab 01/11/18 0431 01/12/18 0446 01/14/18 0343 01/15/18 0500  WBC 7.6 5.7 12.0* 13.3*  NEUTROABS 3.1 2.8 4.5  --   HGB 11.1* 10.1* 9.9* 10.7*  HCT 32.2* 28.7* 28.8* 31.0*  MCV 92.8 92.0 93.5 93.4  PLT 272 259 286 536   Basic Metabolic Panel: Recent Labs  Lab 01/11/18 0431 01/12/18 0446 01/14/18 0343 01/15/18 0500 01/16/18 0420  NA 125* 125* 130* 132*  --   K 3.3* 4.1 4.0 3.6  --   CL 97* 97* 100* 101  --   CO2 20* 21* 21* 22  --   GLUCOSE 101* 149* 96 95  --   BUN 16 15 16 12   --   CREATININE 0.79 0.67 0.68 0.64 0.44*  CALCIUM 7.5* 7.7* 8.0* 8.4*  --    GFR: Estimated Creatinine Clearance: 69.8 mL/min (A) (by C-G formula based on SCr of 0.44 mg/dL (L)). Liver Function Tests: Recent Labs  Lab 01/10/18 0732 01/11/18 0431 01/12/18 0446  AST 147* 134* 94*  ALT 306* 277* 233*  ALKPHOS 82 82 102  BILITOT 0.6 0.6 0.4  PROT 4.5* 4.2* 4.1*  ALBUMIN 2.2* 2.1* 2.1*   No results for input(s): LIPASE, AMYLASE in the last 168 hours. No results for input(s): AMMONIA in the last 168 hours. Coagulation Profile: Recent Labs  Lab 01/10/18 0732 01/11/18 0431 01/12/18 0446  INR 1.46 1.45 1.44   Cardiac Enzymes: Recent Labs  Lab 01/16/18 0420  CKTOTAL 21*   BNP (last 3 results) No results for input(s): PROBNP in the last 8760 hours. HbA1C: No results for input(s): HGBA1C in the last 72 hours. CBG: No results for input(s): GLUCAP in the last 168 hours. Lipid Profile: No results for input(s): CHOL, HDL, LDLCALC, TRIG, CHOLHDL, LDLDIRECT in the last 72 hours. Thyroid Function Tests: No results for input(s): TSH, T4TOTAL, FREET4, T3FREE, THYROIDAB in the last 72 hours. Anemia Panel: No results for input(s):  VITAMINB12, FOLATE, FERRITIN, TIBC, IRON, RETICCTPCT in the last 72 hours. Urine analysis:    Component Value Date/Time   COLORURINE AMBER (A) 01/08/2018 0419   APPEARANCEUR CLOUDY (A) 01/08/2018 0419   LABSPEC 1.027 01/08/2018 0419   PHURINE 6.0 01/08/2018 0419   GLUCOSEU NEGATIVE 01/08/2018 0419   HGBUR NEGATIVE 01/08/2018 0419   BILIRUBINUR NEGATIVE 01/08/2018 0419   KETONESUR 5 (A) 01/08/2018 0419   PROTEINUR 100 (A) 01/08/2018 0419   UROBILINOGEN 0.2 05/04/2015 1043   NITRITE NEGATIVE 01/08/2018 0419   LEUKOCYTESUR NEGATIVE 01/08/2018 0419   Sepsis Labs: @LABRCNTIP (procalcitonin:4,lacticidven:4)   Urine Culture     Status: None   Collection Time: 01/08/18  4:20 AM  Result Value Ref Range Status   Specimen Description   Final    URINE, CLEAN CATCH Performed at [facility], [address] [place], [place] [address]    Special Requests   Final    NONE Performed  at [facility], [address] [place], [place] [address]    Culture   Final    NO GROWTH    Report Status 01/09/2018 FINAL  Final  Culture, blood (Routine X 2) w Reflex to ID Panel     Status: None (Preliminary result)   Collection Time: 01/08/18  4:48 AM  Result Value Ref Range Status   Specimen Description BLOOD LEFT ARM  Final   Special Requests   Final    BOTTLES DRAWN AEROBIC AND ANAEROBIC Blood Culture results may not be optimal due to an inadequate volume of blood received in culture bottles   Culture   Final    NO GROWTH 2 DAYS    Report Status PENDING  Incomplete  Culture, blood (Routine X 2) w Reflex to ID Panel     Status: None (Preliminary result)   Collection Time: 01/08/18  5:41 AM  Result Value Ref Range Status   Specimen Description BLOOD RIGHT ARM  Final   Special Requests   Final    BOTTLES DRAWN AEROBIC AND ANAEROBIC Blood Culture adequate volume   Culture   Final    NO GROWTH 2 DAYS Performed at [facility], [address] [place], [place] [address]    Report Status PENDING   Incomplete      Radiology Studies: Dg Chest Port 1 View Result Date: 01/09/2018 1. Left PICC terminates over the lower SVC. 2. Clear lungs.  Dg Chest Port 1 View Result Date: 01/08/2018 1. No radiographic evidence for active cardiopulmonary disease. 2. Aortic atherosclerosis.   US Abdomen Limited Ruq Result Date: 01/08/2018 Layering sludge and/or small stones within the gallbladder. No wall thickening or evidence of acute cholecystitis.     Scheduled Meds: . amLODipine  10 mg Oral Daily  . aspirin  325 mg Oral Daily  . docusate sodium  100 mg Oral BID  . enoxaparin   40 mg Subcutaneous Q24H   Continuous Infusions: . DAPTOmycin (CUBICIN)  IV Stopped (01/15/18 1649)     LOS: 7 days    Time spent: 25 minutes  Greater than 50% of the time spent on counseling and coordinating the care.   Leisa Lenz, MD [facility] Pager [phone]  If 7PM-7AM, please contact night-coverage [email or web address] Password [facility] 01/16/2018, 11:33 AM

## 2018-01-17 DIAGNOSIS — Z452 Encounter for adjustment and management of vascular access device: Secondary | ICD-10-CM

## 2018-01-17 MED ORDER — DAPTOMYCIN IV (FOR PTA / DISCHARGE USE ONLY): 580.0000 mg | INTRAVENOUS | 0 refills | Status: DC

## 2018-01-17 MED ORDER — LEVOFLOXACIN 500 MG PO TABS: 500.0000 mg | ORAL_TABLET | Freq: Every day | ORAL | 0 refills | Status: AC

## 2018-01-17 MED ORDER — PREDNISONE 10 MG PO TABS: ORAL_TABLET | ORAL | 0 refills | Status: DC

## 2018-01-17 MED ORDER — HEPARIN SOD (PORK) LOCK FLUSH 100 UNIT/ML IV SOLN
250.0000 [IU] | INTRAVENOUS | Status: AC | PRN
  Administered 2018-01-17: 250 [IU]

## 2018-01-17 MED ORDER — ENSURE ENLIVE PO LIQD: 237.0000 mL | Freq: Two times a day (BID) | ORAL | 12 refills | Status: DC

## 2018-01-17 MED ORDER — LORATADINE 10 MG PO TABS: 10.0000 mg | ORAL_TABLET | Freq: Every day | ORAL | 0 refills | Status: DC

## 2018-01-17 NOTE — Discharge Summary (Addendum)
Discharge Summary  Damon Parrish WSF:[id number] DOB: [birth year]  PCP: Tobe Sos, MD  Admit date: 01/08/2018 Discharge date: 01/17/2018  Time spent: 6mns, more than 50% time spent on coordination of care. Patient is to d/c home on home IV abx, home PT/OT/RN.  Recommendations for Outpatient Follow-up:  1. F/u with PMD within a week  for hospital discharge follow up, repeat cbc/cmp at follow up. 2. F/u with infectious disease Dr HJohnnye Simain a month. Leave picc line in until evaluated by infectious disease. 3[facility]charge Diagnoses:  Active Hospital Problems   Diagnosis Date Noted  . Adverse drug reaction 01/08/2018  . Allergic reaction   . DRESS syndrome   . Pressure injury of skin 01/09/2018  . Dermatitis, drug-induced 01/08/2018  . 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 [place]omy[place] of [place] spine [place] 12/17[place]    Resolved Hospital Problems  No resolved problems to display.    Discharge Condition: stable  Diet recommendation: heart healthy/carb modified  Filed Weights   01/08/18 0413  Weight: 72.6 kg (160 lb)    History of present illness: [facility]: PTobe Sos MD   Chief Complaint: Rash  HPI: Damon SIZEMOREis a 77y.o. male [facility]. 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 He was empirically started on vancomycin and ceftriaxone [facility]. IR was consulted for a bone biopsy. He was continued on vancomycin and ceftriaxone. Apparently the blood and biopsy results were without growth. Infectious disease evaluated the patient and recommended continuing him on vancomycin and ceftriaxone for a total of 8 weeks  with the end date being 02/13/2018. The patient had been subsequently discharged home after that with a home PICC line. He was also supposed to have some repeat imaging in about 8 weeks. He had follow-up with infectious disease and orthopedics arrange for outpatient management.  Reportedly he had been at home on IV antibiotics for approximately 2 weeks. Wife reported that she had administered his nighttime antibiotics and then subsequently noticed that he had developed a raised red rash over the face, neck and upper body. He is not having the rash on the lower body at this time. He has some mild itching but has not been scratching. He never had tongue swelling, throat swelling or difficulty breathing. He was not having any symptoms of pruritus. His wife gave him an EpiPen dose without response. He developed a temperature of 100.6 and he was told to come to the emergency room for further evaluation. The patient has continued to have pain in his back which is essentially unchanged from his chronic symptoms.  ED course: He was evaluated in the emergency department and was diagnosed with a drug eruption. He was noted to be not anaphylactic. It was unclear which antibiotic was causing the drug reaction. He had no neurological deficits. It was noted that he would need infectious disease input and [facility] was consulted and he was accepted for transfer for by Dr. RLenoria ChimePatient is awaiting for a bed at [facility] was called for thepatient to be seen by the hospitalist [facility] The patient was noted to have some mild right upper quadrant abdominal pain and an abdominal ultrasound  was ordered. There was notation of gallbladder sludge and small stones but no acute findings of cholecystitis noted.  Unfortunately patient unable to get a bed at [facility] after 30+ hours of waiting in ED and ED providers requested that he be [facility] and he will be admitted to  [facility] as a bed should be available later today per patient placement team.     Hospital Course:  Principal Problem:   Adverse drug reaction Active Problems:   Discitis   Epidural abscess   Osteomyelitis of thoracic spine (HCC)   Dermatitis, drug-induced   Pressure injury of skin   Allergic reaction   DRESS syndrome   Skin rash/DRESS: - abx reaction to vanc/rocephin. No oral mucosa involvement. He does has elevated eosinophil and lymphocyte on presentation. -D/c vanc/rocephin. -he is started on steroids/claritin on admission, rash has much improved, almost resolved at discharge. -he is discharged on steroids taper and 35moe days of claritin.  -pmd follow up.   Active Problems:   Epidural abscess / Osteomyelitis of thoracic spine (HCC) / Leukocytosis - MRI done persistent T9-10 discitis osteomyelitis with interval increase deformity of the opposing endplates and loss of T9 vertebral body height. Rim enhancing collection within the intervertebral disc space likely represents an organized abscess.  Decreased epidural edema and resolved epidural fluid collection at the T9 and T10 levels. No residual cord impingement.  No new acute osseous abnormality identified.  - improving, pain well controlled, he ambulated this am independently, [facility] infectious disease consulted who recommend d/c on IV dapto and oral Levaquin    Discharge antibiotics: dapto, levaquin 5028mdaily Per pharmacy protocol dapto  Duration: 8 weeks.  End Date: 02-13-18  [facility]: please  Labs weekly while on IV antibiotics: _x_ CBC with differential __ BMP _x_ CMP __ CRP __ ESR __ Vancomycin trough _x_ CK  __ Please pull PIC at completion of IV antibiotics _x_ Please leave PIC in place until doctor has seen patient or been notified  Fax weekly labs to ([phone] Follow Up Appt: 1 month                                                                                       JeBobby RumpfD, FACP Infectious Diseases (pager) [phone] [email or web address]        Hyponatremia - Likely due to dehydration or acute infectious process - Sodium improved  -follow up with pmd , repeat labs at follow up.    Abnormal LFT's - Possibly from statin therapy  - LFT's gradually trending down - ALP and bilirubin are WNL -denies ab pain, no n/v.  -follow up with pmd, repeat labs at follow up.  HTN; stable on norvasc. He is on asa 325 at home which is continued. He is to follow up with pmd.   DVT prophylaxis while in the hospital: Lovenox subQ Code Status: DNR/DNI Family Communication: wife at bedside  Disposition Plan: home now that  Rash has much  improves    Consultants:   ID  Procedures:   None   Antimicrobials:   Daptomycin  4/18 -->  Levaquin 4/26 -->   Discharge Exam: BP 126/71 (BP Location: Right Arm)   Pulse (!) 102   Temp 98 F (36.7 C) (Oral)   Resp 18   Ht _0  (1.676 m)   Wt 72.6 kg (160 lb)   SpO2 99%   BMI 25.82 kg/m   General: NAD Cardiovascular: RRR Respiratory: CTABL Skin: generalized rash has almost resolved.  Discharge Instructions You were cared for by a hospitalist during your hospital stay. If you have any questions about your discharge medications or the care you received while you were in the hospital after you are discharged, you can call the unit and asked to speak with the hospitalist on call if the hospitalist that took care of you is not available. Once you are discharged, your primary care physician will handle any further medical issues. Please note that NO REFILLS for any discharge medications will be authorized once you are discharged, as it is imperative that you return to your primary care physician (or establish a relationship with a primary care physician if you do not have one) for your aftercare needs so that they can reassess your need for  medications and monitor your lab values.  Discharge Instructions    Diet - low sodium heart healthy   Complete by:  As directed    Home infusion instructions [facility] May follow [place] Dosing Protocol; May administer Cathflo as needed to maintain patency of vascular access device.; Flushing of vascular access device: per [facility] Protocol: 0.9% NaCl pre/post medica...   Complete by:  As directed    Instructions:  May follow [place] Dosing Protocol   Instructions:  May administer Cathflo as needed to maintain patency of vascular access device.   Instructions:  Flushing of vascular access device: per [employer] Protocol: 0.9% NaCl pre/post medication administration and prn patency; Heparin 100 u/ml, 74m for implanted ports and Heparin 10u/ml, 512mfor all other central venous catheters.   Instructions:  May follow [facility] Anaphylaxis Protocol for First Dose Administration in the home: 0.9% NaCl at 25-50 ml/hr to maintain IV access for protocol meds. Epinephrine 0.3 ml IV/IM PRN and Benadryl 25-50 IV/IM PRN s/s of anaphylaxis.   Instructions:  AdKenwoodnfusion Coordinator (RN) to assist per patient IV care needs in the home PRN.   Increase activity slowly   Complete by:  As directed      Allergies as of 01/17/2018      Reactions   Cephalosporins Rash   Full body rash with systemic symptoms   Vancomycin Rash   Full body rash with systemic symptoms      Medication List    STOP taking these medications   atorvastatin 40 MG tablet Commonly known as:  LIPITOR   cefTRIAXone 2 g in sodium chloride 0.9 % 100 mL   Vancomycin 750-5 MG/150ML-% Soln Commonly known as:  VANCOCIN     TAKE these medications   amLODipine 5 MG tablet Commonly known as:  NORVASC Take 1 tablet (5 mg total) by mouth daily. What changed:  how much to take   aspirin 325 MG tablet Take 325 mg by mouth daily.   beta carotene 10000 UNIT capsule Take 10,000 Units by mouth daily.   daptomycin  IVPB Commonly known as:  CUBICIN Inject 580 mg into the vein daily. Indication: Culture negative discitis / osteomyelitis Last Day of Therapy: 02/13/18 Labs - Once weekly:  CBC/D, BMP, and CPK Labs - Every other week:  ESR and CRP   feeding supplement (ENSURE ENLIVE) Liqd Take 237 mLs by mouth 2 (two) times daily between meals.   fluticasone 50 MCG/ACT nasal spray Commonly known as:  FLONASE Place 1 spray into the nose daily.   HYDROcodone-acetaminophen 5-325 MG tablet Commonly known as:  NORCO/VICODIN Take 1 tablet by mouth every 6 (six) hours as needed for moderate pain.   levofloxacin 500 MG tablet Commonly known as:  LEVAQUIN Take 1 tablet (500 mg total) by mouth daily. Start taking on:  01/18/2018   loratadine 10 MG tablet Commonly known as:  CLARITIN Take 1 tablet (10 mg total) by mouth daily for 5 days. Start taking on:  01/18/2018   multivitamin capsule Take 1 capsule by mouth daily.   multivitamin-lutein Caps capsule Take 1 capsule by mouth daily.   predniSONE 10 MG tablet Commonly known as:  DELTASONE Take 4tabs (54m) x1 on day one, then 2tab (257m)x1  on day two, then 1tab (1070mx1  on day three, then stop   SIMBRINZA 1-0.2 % Susp Generic drug:  Brinzolamide-Brimonidine Place 1 drop into both eyes every evening. Morning and 8pm   tiZANidine 2 MG tablet Commonly known as:  ZANAFLEX Take 1 tablet by mouth every 4 (four) hours as needed.   TRAVATAN Z 0.004 % Soln ophthalmic solution Generic drug:  Travoprost (BAK Free) Place 1 drop into both eyes 2 (two) times daily.   vitamin C 1000 MG tablet Take 1,000 mg by mouth daily.   VITAMIN D-1000 MAX ST 1000 units tablet Generic drug:  Cholecalciferol Take 1 tablet by mouth daily.   vitamin E 400 UNIT capsule Take 400 Units by mouth daily.            Home Infusion Instuctions  (From admission, onward)        Start     Ordered   01/17/18 0000  Home infusion instructions [facility] May  follow [facility] Protocol; May administer Cathflo as needed to maintain patency of vascular access device.; Flushing of vascular access device: [facility]: 0.9% NaCl pre/post medica...    Question Answer Comment  Instructions May follow [facility]osing Protocol   Instructions May administer Cathflo as needed to maintain patency of vascular access device.   Instructions Flushing of vascular access device: per [facility]: 0.9% NaCl pre/post medication administration and prn patency; Heparin 100 u/ml, 5ml87mr implanted ports and Heparin 10u/ml, 5ml 46m all other central venous catheters.   Instructions May follow [facility] Anaphylaxis Protocol for First Dose Administration in the home: 0.9% NaCl at 25-50 ml/hr to maintain IV access for protocol meds. Epinephrine 0.3 ml IV/IM PRN and Benadryl 25-50 IV/IM PRN s/s of anaphylaxis.   Instructions [facility] Infusion Coordinator (RN) to assist per patient IV care needs in the home PRN.      01/17/18 0926     Allergies  Allergen Reactions  . Cephalosporins Rash    Full body rash with systemic symptoms  . Vancomycin Rash    Full body rash with systemic symptoms   Follow-up Information    PradhTobe SosFollow up in 1 week(s).   Specialty:  Internal Medicine Why:  hospital discharge follow up, repeat cbc/Cmp (including liver function) at follow up.  Contact information: [phone]        HatchCampbell RichesFollow up in 1 month(s).   Specialty:  Infectious Diseases Contact information: [address]  [place] [address] [phone]        follow up with neurosurgery Follow up.            The results of significant diagnostics from this hospitalization (including imaging, microbiology, ancillary and laboratory) are listed below for reference.    Significant Diagnostic Studies: Mr Thoracic Spine W Wo Contrast  Result Date: 01/14/2018 CLINICAL DATA:   77 y/o M; history of discitis osteomyelitis for follow-up. EXAM: MRI THORACIC WITHOUT AND WITH CONTRAST TECHNIQUE: Multiplanar and multiecho pulse sequences of the thoracic spine were obtained without and with intravenous contrast. CONTRAST:  11m MULTIHANCE GADOBENATE DIMEGLUMINE 529 MG/ML IV SOLN COMPARISON:  12/17/2017 thoracic spine MRI. FINDINGS: MRI THORACIC SPINE FINDINGS Alignment:  Physiologic. Vertebrae: Persistent diffuse enhancement of the T9 and T10 vertebral bodies. Interval increased irregularity of the opposing endplates and loss of height of the T9 vertebral body. Rim enhancing collection within the intervertebral disc space measuring 24 x 15 x 10 mm likely representing an organized abscess (series 10, image 32 and series 11, image 7). Is mild persistent enhancement and thickening of the anterior epidural space at the T9 and T10 levels, but much decreased in the prior study and the fluid collection is resolved as well as the cord impingement. No new abnormal signal of vertebral bodies or intervertebral discs. Cord:  Normal signal and morphology. Paraspinal and other soft tissues: Paravertebral edema and enhancement at the T9 and T10 levels. Disc levels: Multilevel disc desiccation with small disc bulges resulting in ventral thecal sac effacement from T3 through T10. Additionally, facet hypertrophy results in bony foraminal stenosis at the T1-2 level. IMPRESSION: 1. Persistent T9-10 discitis osteomyelitis with interval increase deformity of the opposing endplates and loss of T9 vertebral body height. Rim enhancing collection within the intervertebral disc space likely represents an organized abscess. 2. Decreased epidural edema and resolved epidural fluid collection at the T9 and T10 levels. No residual cord impingement. 3. No new acute osseous abnormality identified. Electronically Signed   By: LKristine GarbeM.D.   On: 01/14/2018 15:06   Dg Chest Port 1 View  Result Date:  01/09/2018 CLINICAL DATA:  PICC in place. EXAM: PORTABLE CHEST 1 VIEW COMPARISON:  01/08/2018 FINDINGS: A left PICC remains in place and terminates over the lower SVC. The cardiac silhouette is within normal limits. Thoracic aortic tortuosity and atherosclerosis are noted. The lungs are hyperinflated. No airspace consolidation, edema, sizeable pleural effusion, or pneumothorax is identified. No acute osseous abnormality is seen. IMPRESSION: 1. Left PICC terminates over the lower SVC. 2. Clear lungs. Electronically Signed   By: ALogan BoresM.D.   On: 01/09/2018 20:16   Dg Chest Port 1 View  Result Date: 01/08/2018 CLINICAL DATA:  Initial evaluation for acute fever. EXAM: PORTABLE CHEST 1 VIEW COMPARISON:  None. FINDINGS: Cardiac and mediastinal silhouettes are within normal limits. Aortic atherosclerosis. Lungs normally inflated. Mild diffuse chronic coarsening of the interstitial markings. No consolidative airspace disease. No pulmonary edema or pleural effusion. No discernible pneumothorax, although evaluation mildly limited by patient positioning (supine view). No acute osseous abnormality. IMPRESSION: 1. No radiographic evidence for active cardiopulmonary disease. 2. Aortic atherosclerosis. Electronically Signed   By: BJeannine BogaM.D.   On: 01/08/2018 05:17   UKoreaAbdomen Limited Ruq  Result Date: 01/08/2018 CLINICAL DATA:  Fever, elevated LFTs EXAM: ULTRASOUND ABDOMEN LIMITED RIGHT UPPER QUADRANT COMPARISON:  CT 12/16/2017 FINDINGS: Gallbladder: Multiple mobile layering small stones and/or sludge. No wall thickening or sonographic Murphy's sign. Common  bile duct: Diameter: Normal caliber, 2 mm Liver: No focal lesion identified. Within normal limits in parenchymal echogenicity. Portal vein is patent on color Doppler imaging with normal direction of blood flow towards the liver. IMPRESSION: Layering sludge and/or small stones within the gallbladder. No wall thickening or evidence of acute  cholecystitis. Electronically Signed   By: Rolm Baptise M.D.   On: 01/08/2018 08:13    Microbiology: Recent Results (from the past 240 hour(s))  Urine Culture     Status: None   Collection Time: 01/08/18  4:20 AM  Result Value Ref Range Status   Specimen Description   Final    URINE, CLEAN CATCH Performed at [facility], [address], [place], [place] [address]    Special Requests   Final    NONE Performed at [facility], [address], [place], [place] [address]    Culture   Final    NO GROWTH Performed at [facility], [address], [place], [place] [address]    Report Status 01/09/2018 FINAL  Final  Culture, blood (Routine X 2) w Reflex to ID Panel     Status: None   Collection Time: 01/08/18  4:48 AM  Result Value Ref Range Status   Specimen Description BLOOD LEFT ARM  Final   Special Requests   Final    BOTTLES DRAWN AEROBIC AND ANAEROBIC Blood Culture results may not be optimal due to an inadequate volume of blood received in culture bottles   Culture   Final    NO GROWTH 5 DAYS Performed at [facility], [address], [place], [place] [address]    Report Status 01/13/2018 FINAL  Final  Culture, blood (Routine X 2) w Reflex to ID Panel     Status: None   Collection Time: 01/08/18  5:41 AM  Result Value Ref Range Status   Specimen Description BLOOD RIGHT ARM  Final   Special Requests   Final    BOTTLES DRAWN AEROBIC AND ANAEROBIC Blood Culture adequate volume   Culture   Final    NO GROWTH 5 DAYS Performed at [facility], [address] [place], [place] [address]    Report Status 01/13/2018 FINAL  Final     Labs: Basic Metabolic Panel: Recent Labs  Lab 01/11/18 0431 01/12/18 0446 01/14/18 0343 01/15/18 0500 01/16/18 0420  NA 125* 125* 130* 132*  --   K 3.3* 4.1 4.0 3.6  --   CL 97* 97* 100* 101  --   CO2 20* 21* 21* 22  --   GLUCOSE 101* 149* 96 95  --   BUN _0 --   CREATININE 0.79 0.67 0.68 0.64 0.44*  CALCIUM 7.5* 7.7* 8.0*  8.4*  --    Liver Function Tests: Recent Labs  Lab 01/11/18 0431 01/12/18 0446  AST 134* 94*  ALT 277* 233*  ALKPHOS 82 102  BILITOT 0.6 0.4  PROT 4.2* 4.1*  ALBUMIN 2.1* 2.1*   No results for input(s): LIPASE, AMYLASE in the last 168 hours. No results for input(s): AMMONIA in the last 168 hours. CBC: Recent Labs  Lab 01/11/18 0431 01/12/18 0446 01/14/18 0343 01/15/18 0500  WBC 7.6 5.7 12.0* 13.3*  NEUTROABS 3.1 2.8 4.5  --   HGB 11.1* 10.1* 9.9* 10.7*  HCT 32.2* 28.7* 28.8* 31.0*  MCV 92.8 92.0 93.5 93.4  PLT 272 259 286 346   Cardiac Enzymes: Recent Labs  Lab 01/16/18 0420  CKTOTAL 21*   BNP:  BNP (last 3 results) No results for input(s): BNP in the last 8760 hours.  ProBNP (last 3 results) No results for input(s): PROBNP in the last 8760 hours.  CBG: No results for input(s): GLUCAP in the last 168 hours.     Signed:  Florencia Reasons MD, PhD  [facility] 01/17/2018, 9:50 AM

## 2018-01-17 NOTE — Plan of Care (Signed)
  Problem: Education: Goal: Knowledge of General Education information will improve Outcome: Progressing   Problem: Clinical Measurements: Goal: Ability to maintain clinical measurements within normal limits will improve Outcome: Progressing Goal: Will remain free from infection Outcome: Progressing Goal: Diagnostic test results will improve Outcome: Progressing Goal: Respiratory complications will improve Outcome: Progressing Goal: Cardiovascular complication will be avoided Outcome: Progressing   Problem: Activity: Goal: Risk for activity intolerance will decrease Outcome: Progressing   Problem: Nutrition: Goal: Adequate nutrition will be maintained Outcome: Progressing   Problem: Coping: Goal: Level of anxiety will decrease Outcome: Progressing   Problem: Elimination: Goal: Will not experience complications related to bowel motility Outcome: Progressing Goal: Will not experience complications related to urinary retention Outcome: Progressing   Problem: Pain Managment: Goal: General experience of comfort will improve Outcome: Progressing   Problem: Safety: Goal: Ability to remain free from injury will improve Outcome: Progressing   Problem: Skin Integrity: Goal: Risk for impaired skin integrity will decrease Outcome: Progressing   Problem: Education: Goal: Knowledge of General Education information will improve Outcome: Progressing   Problem: Health Behavior/Discharge Planning: Goal: Ability to manage health-related needs will improve Outcome: Progressing   Problem: Clinical Measurements: Goal: Ability to maintain clinical measurements within normal limits will improve Outcome: Progressing Goal: Will remain free from infection Outcome: Progressing Goal: Diagnostic test results will improve Outcome: Progressing Goal: Respiratory complications will improve Outcome: Progressing Goal: Cardiovascular complication will be avoided Outcome: Progressing    Problem: Activity: Goal: Risk for activity intolerance will decrease Outcome: Progressing   Problem: Nutrition: Goal: Adequate nutrition will be maintained Outcome: Progressing   Problem: Coping: Goal: Level of anxiety will decrease Outcome: Progressing   Problem: Elimination: Goal: Will not experience complications related to bowel motility Outcome: Progressing Goal: Will not experience complications related to urinary retention Outcome: Progressing   Problem: Pain Managment: Goal: General experience of comfort will improve Outcome: Progressing   Problem: Safety: Goal: Ability to remain free from injury will improve Outcome: Progressing   Problem: Skin Integrity: Goal: Risk for impaired skin integrity will decrease Outcome: Progressing

## 2018-01-17 NOTE — Care Management Note (Addendum)
Case Management Note  Patient Details  Name: LEAN FAYSON MRN: [id number] Date of Birth: 1941/01/17  Subjective/Objective:                 Patient active w [facility] Lemmings. Verified w Tommi Rumps that [facility] will be tomorrow. Dapto due today at 12:00, will hav etoday's dose prior to discharge. Spoke w Brenton Grills w [facility] who has been following patient while admitted for home IV Abx consult. Patient will DC on daily Dapto, OPAT order in place. Spoke w wife at bedside, she is aware and agreeable with plan, she administered IV Vanc at home prior to admission. OK to DC after 12:00 Dapto dose from CM standpoint.   Benefit check sent for Zyvox as this may be an acceptable PO substitute per Carolynn Sayers as discussed with Dr Tommy Medal. Benefit check copied to Ricki Miller RN CM as it will result Monday.   Action/Plan:   Expected Discharge Date:  01/17/18               Expected Discharge Plan:  [place]  In-House Referral:     Discharge planning Services  CM Consult  Post Acute Care Choice:  Home Health Choice offered to:  Patient  DME Arranged:  IV pump/equipment DME Agency:  [place] Arranged:  RN, PT, OT [facility] Agency:  [place]  Status of Service:  Completed, signed off  If discussed at [place] of Stay Meetings, dates discussed:    Additional Comments:  Carles Collet, RN 01/17/2018, 10:00 AM

## 2018-01-17 NOTE — Progress Notes (Signed)
Occupational Therapy Treatment Patient Details Name: Damon Parrish MRN: [id number] DOB: 05/20/1941 Today's Date: 01/17/2018    History of present illness Pt is a 77 y.o. Male who initially presented to [facility] with complaints of lower back pain late last month. 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  He was empirically started on vancomycin and ceftriaxone and transferred to [facility] health for further evaluation and treatment. Reportedly he had been at home on IV antibiotics for approximately 2 weeks.  He then started to develop fevers and generalized rash. His wife gave him an EpiPen dose without response.    OT comments  Pt. Continues to progress with acute OT goals.  Pt. Able to complete in/out of bed following back precautions for safety and comfort.  LB dressing task eob with mod. A. Wife present for session and will be able to assist as needed at home.  Both eager for d/c home today.   Follow Up Recommendations  Home health OT;Supervision/Assistance - 24 hour    Equipment Recommendations  3 in 1 bedside commode    Recommendations for Other Services      Precautions / Restrictions Precautions Precautions: Back Restrictions Weight Bearing Restrictions: No       Mobility Bed Mobility Overal bed mobility: Needs Assistance Bed Mobility: Rolling;Sidelying to Sit;Sit to Sidelying Rolling: Supervision Sidelying to sit: Min guard     Sit to sidelying: Min assist General bed mobility comments: cues for sequencing  Transfers                      Balance                                           ADL either performed or assessed with clinical judgement   ADL Overall ADL's : Needs assistance/impaired                     Lower Body Dressing: Moderate assistance;Sitting/lateral leans Lower Body Dressing Details (indicate cue type and  reason): pt. able to cross each leg over knee and get part of sock started, required assitance to pull over all toes, (TED hose making it a little difficult).                       Vision       Perception     Praxis      Cognition Arousal/Alertness: Awake/alert Behavior During Therapy: WFL for tasks assessed/performed Overall Cognitive Status: Within Functional Limits for tasks assessed                                          Exercises     Shoulder Instructions       General Comments      Pertinent Vitals/ Pain       Pain Assessment: 0-10 Pain Score: 6  Pain Location: back, with activity Pain Descriptors / Indicators: Sore;Grimacing Pain Intervention(s): Limited activity within patient's tolerance;Repositioned  Home Living  Prior Functioning/Environment              Frequency  Min 2X/week        Progress Toward Goals  OT Goals(current goals can now be found in the care plan section)  Progress towards OT goals: Progressing toward goals     Plan Discharge plan remains appropriate    Co-evaluation                 AM-PAC PT "6 Clicks" Daily Activity     Outcome Measure   Help from another person eating meals?: None Help from another person taking care of personal grooming?: A Little Help from another person toileting, which includes using toliet, bedpan, or urinal?: A Little Help from another person bathing (including washing, rinsing, drying)?: A Lot Help from another person to put on and taking off regular upper body clothing?: A Little Help from another person to put on and taking off regular lower body clothing?: A Little 6 Click Score: 18    End of Session    OT Visit Diagnosis: Unsteadiness on feet (R26.81);Other abnormalities of gait and mobility (R26.89);Muscle weakness (generalized) (M62.81)   Activity Tolerance Patient tolerated treatment well    Patient Left in bed;with call bell/phone within reach;with family/visitor present   Nurse Communication          Time: 6294-7654 OT Time Calculation (min): 11 min  Charges: OT General Charges $OT Visit: 1 Visit OT Treatments $Self Care/Home Management : 8-22 mins   Janice Coffin, COTA/L 01/17/2018, 11:38 AM

## 2018-01-17 NOTE — Plan of Care (Signed)
  Problem: Pain Managment: Goal: General experience of comfort will improve Outcome: Progressing   

## 2018-01-19 ENCOUNTER — Telehealth: Payer: Self-pay

## 2018-01-19 DIAGNOSIS — G062 Extradural and subdural abscess, unspecified: Secondary | ICD-10-CM

## 2018-01-19 NOTE — Care Management (Addendum)
1135 Spoke with Triage nurse at [facility] , provided information below. She will have one of ID MD's call for prior approval. Also        Barnet Glasgow, Delton See, RN  Cc: Robyne Askew, RN        # 1 .  S/W MELISSA @ [employer] RX # [phone]     1. LINEZOLID 600 MG BID  COVER- YES  CO-PAY- $ 96.19  TIER- 4 DRUG  PRIOR APPROVAL- YES # [id number]   2. [place]   NO DEDUCTIBLE   PREFERRED PHARMACY : YES- [place]   Previous Messages    ----- Message -----  From: Damon Collet, RN  Sent: 01/17/2018 11:50 AM  To: Damon Rossetti, RN, *  Subject: benefit check                   Please check and correspond with Damon Miller RN CM   Linezolid (Zyxov) 600mg  PO BID

## 2018-01-19 NOTE — Telephone Encounter (Signed)
Spoke with Pam from [facility] regarding patient's antibiotics. Told her per Dr. Johnnye Sima patient can be switched to doxy. Pam asked if we could call it into the pharmacy. Please dose. Pola Corn, LPN

## 2018-01-19 NOTE — Telephone Encounter (Signed)
Called #  No answer Can change to doxy if you are not able to call and get response thanks

## 2018-01-19 NOTE — Telephone Encounter (Signed)
Received a call from Lubertha Basque at [facility] stating patient was put on IV antibiotic cubicin by Dr. Johnnye Sima for [facility] to administer. [facility] from [facility] states that treatment was $154/day for patient which is too expensive. Pam also states that she spoke to Dr. Drucilla Schmidt who gave the ok to switch patient to PO Zyvox which is $96.19 but a doctor needs to call [phone] in order for patient to receive this medication. Patient only covered for today. He has nothing for tomorrow per Nira Conn. Pola Corn, LPN

## 2018-01-19 NOTE — Care Management (Signed)
    Spoke with Triage nurse at [facility] , provided below information. Patient has Cubin IV for today through [facility] but no coverage starting tomorrow January 20, 2018 . Nurse aware. She will provide information to ID doctor. Spoke with patient's wife via phone. She is aware of all above information. Mrs Cosman voiced understanding and will call ID Center if she has any other questions or does not hear from [place] regarding prescription by mid afternoon.  Magdalen Spatz RN BSN [phone] Sandi Mariscal, [place], RN  Cc: Robyne Askew, RN        # 1 .  S/W MELISSA @ [employer] RX # [id number]     1. LINEZOLID 600 MG BID  COVER- YES  CO-PAY- $ 96.19  TIER- 4 DRUG  PRIOR APPROVAL- YES # [phone]   2. [place]   NO DEDUCTIBLE   PREFERRED PHARMACY : YES- [place]   Previous Messages    ----- Message -----  From: Carles Collet, RN  Sent: 01/17/2018 11:50 AM  To: Verlee Rossetti, RN, *  Subject: benefit check                   Please check and correspond with Ricki Miller RN CM   Linezolid (Zyxov) 600mg  PO BID

## 2018-01-20 ENCOUNTER — Telehealth: Payer: Self-pay | Admitting: *Deleted

## 2018-01-20 MED ORDER — DOXYCYCLINE HYCLATE 100 MG PO TABS: 100.0000 mg | ORAL_TABLET | Freq: Two times a day (BID) | ORAL | 1 refills | Status: DC

## 2018-01-20 NOTE — Telephone Encounter (Signed)
Per verbal from Dr Johnnye Sima medication sent in to the pharmacy as  Doxycyclin 100 mg x2 daily #60 with 2 refills. Will call the patient to have him come in for a visit in 5 weeks per request from [place].

## 2018-01-20 NOTE — Telephone Encounter (Signed)
Error

## 2018-01-20 NOTE — Addendum Note (Signed)
Addended by: Janyce Llanos F on: 01/20/2018 11:59 AM   Modules accepted: Orders

## 2018-01-21 NOTE — Telephone Encounter (Signed)
Thanks Jeff.

## 2018-02-12 ENCOUNTER — Inpatient Hospital Stay: Payer: Medicare Other | Admitting: Family

## 2018-03-25 ENCOUNTER — Ambulatory Visit: Payer: Medicare Other | Admitting: Gastroenterology

## 2018-09-03 IMAGING — CR DG CHEST 1V PORT
1 series · 1 of 1 positions shown · non-contrast
Comparison: None.

CLINICAL DATA: Initial evaluation for acute fever.

EXAM:
PORTABLE CHEST 1 VIEW

[portable]
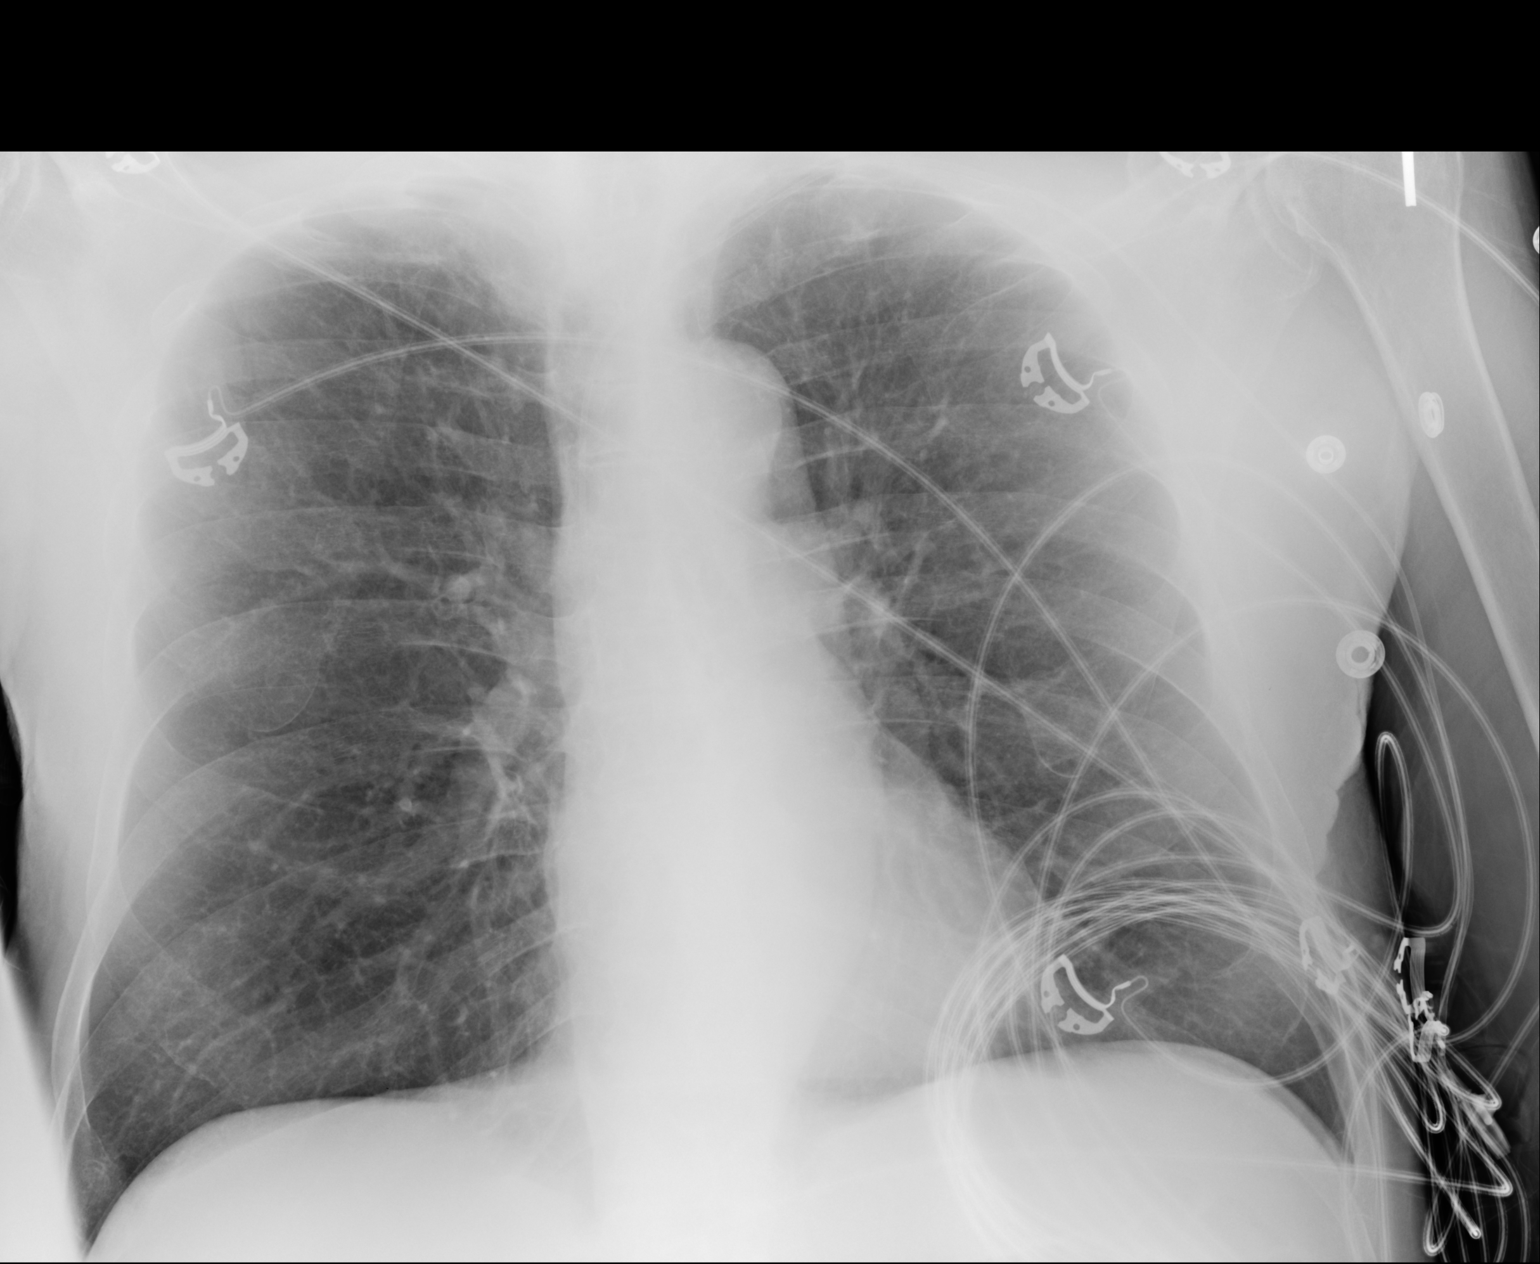

[1 of 1 positions shown; findings below may reference images not displayed]

FINDINGS: Cardiac and mediastinal silhouettes are within normal limits. Aortic
atherosclerosis.

Lungs normally inflated. Mild diffuse chronic coarsening of the
interstitial markings. No consolidative airspace disease. No
pulmonary edema or pleural effusion. No discernible pneumothorax,
although evaluation mildly limited by patient positioning (supine
view).

No acute osseous abnormality.
IMPRESSION: 1. No radiographic evidence for active cardiopulmonary disease.
2. Aortic atherosclerosis.

## 2018-09-04 IMAGING — DX DG CHEST 1V PORT
1 series · 1 of 1 positions shown · non-contrast
Comparison: 01/08/2018

CLINICAL DATA: PICC in place.

EXAM:
PORTABLE CHEST 1 VIEW

[chest ap]
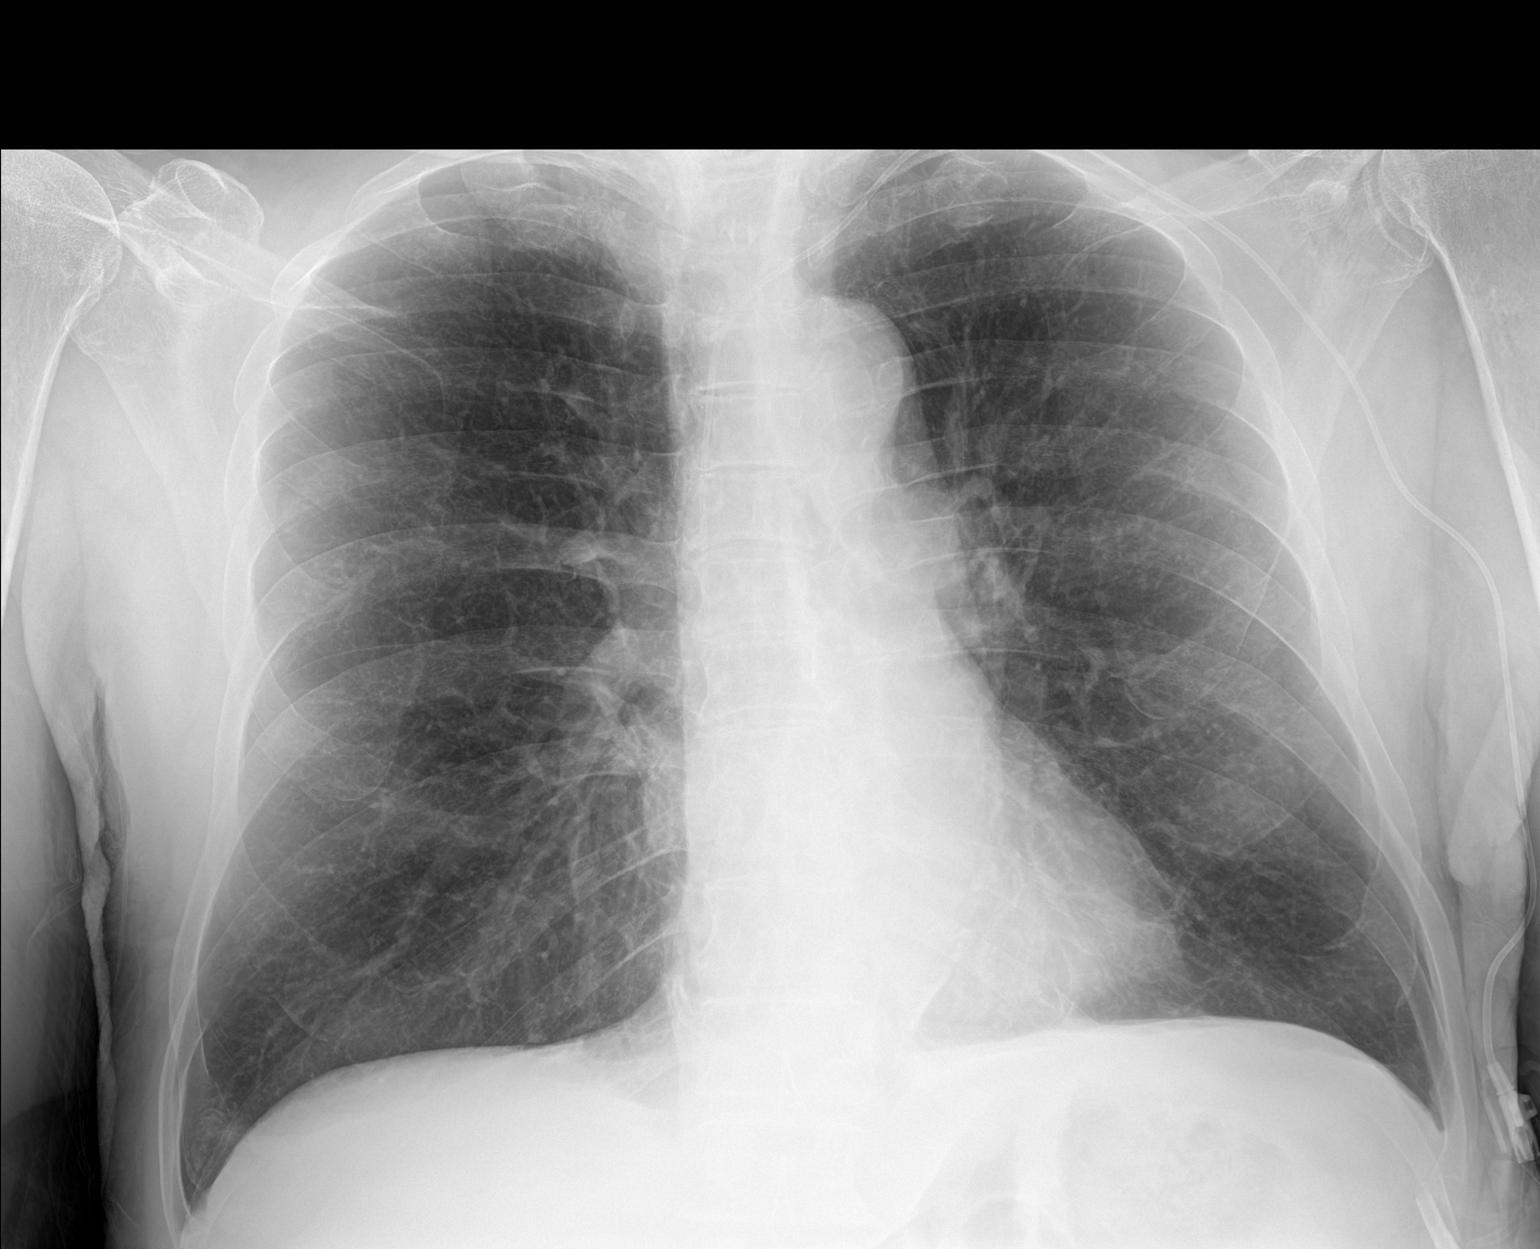

[1 of 1 positions shown; findings below may reference images not displayed]

FINDINGS: A left PICC remains in place and terminates over the lower SVC. The
cardiac silhouette is within normal limits. Thoracic aortic
tortuosity and atherosclerosis are noted. The lungs are
hyperinflated. No airspace consolidation, edema, sizeable pleural
effusion, or pneumothorax is identified. No acute osseous
abnormality is seen.
IMPRESSION: 1. Left PICC terminates over the lower SVC.
2. Clear lungs.

## 2019-01-07 ENCOUNTER — Emergency Department (HOSPITAL_COMMUNITY): Payer: Medicare Other

## 2019-01-07 ENCOUNTER — Other Ambulatory Visit: Payer: Self-pay

## 2019-01-07 ENCOUNTER — Observation Stay (HOSPITAL_COMMUNITY)
Admission: EM | Admit: 2019-01-07 | Discharge: 2019-01-08 | Disposition: A | Discharge status: Home / Self Care | Payer: Medicare Other | Attending: Internal Medicine | Admitting: Internal Medicine

## 2019-01-07 ENCOUNTER — Encounter (HOSPITAL_COMMUNITY): Payer: Self-pay | Admitting: Emergency Medicine

## 2019-01-07 DIAGNOSIS — R200 Anesthesia of skin: Secondary | ICD-10-CM

## 2019-01-07 DIAGNOSIS — I63411 Cerebral infarction due to embolism of right middle cerebral artery: Secondary | ICD-10-CM | POA: Diagnosis present

## 2019-01-07 DIAGNOSIS — I10 Essential (primary) hypertension: Secondary | ICD-10-CM | POA: Diagnosis not present

## 2019-01-07 DIAGNOSIS — Z8673 Personal history of transient ischemic attack (TIA), and cerebral infarction without residual deficits: Secondary | ICD-10-CM | POA: Diagnosis not present

## 2019-01-07 DIAGNOSIS — R253 Fasciculation: Secondary | ICD-10-CM | POA: Diagnosis present

## 2019-01-07 DIAGNOSIS — R202 Paresthesia of skin: Secondary | ICD-10-CM | POA: Insufficient documentation

## 2019-01-07 DIAGNOSIS — H409 Unspecified glaucoma: Secondary | ICD-10-CM | POA: Insufficient documentation

## 2019-01-07 DIAGNOSIS — G40109 Localization-related (focal) (partial) symptomatic epilepsy and epileptic syndromes with simple partial seizures, not intractable, without status epilepticus: Principal | ICD-10-CM

## 2019-01-07 DIAGNOSIS — Z79899 Other long term (current) drug therapy: Secondary | ICD-10-CM | POA: Insufficient documentation

## 2019-01-07 DIAGNOSIS — Z7982 Long term (current) use of aspirin: Secondary | ICD-10-CM | POA: Insufficient documentation

## 2019-01-07 DIAGNOSIS — E871 Hypo-osmolality and hyponatremia: Secondary | ICD-10-CM | POA: Diagnosis not present

## 2019-01-07 DIAGNOSIS — F17210 Nicotine dependence, cigarettes, uncomplicated: Secondary | ICD-10-CM | POA: Insufficient documentation

## 2019-01-07 DIAGNOSIS — J309 Allergic rhinitis, unspecified: Secondary | ICD-10-CM | POA: Diagnosis not present

## 2019-01-07 HISTORY — DX: Generalized skin eruption due to drugs and medicaments taken internally: L27.0

## 2019-01-07 LAB — CBC WITH DIFFERENTIAL/PLATELET
Abs Immature Granulocytes: 0.01 10*3/uL (ref 0.00–0.07)
Basophils Absolute: 0 10*3/uL (ref 0.0–0.1)
Basophils Relative: 1 %
Eosinophils Absolute: 0.2 10*3/uL (ref 0.0–0.5)
Eosinophils Relative: 3 %
HCT: 43.4 % (ref 39.0–52.0)
Hemoglobin: 14.6 g/dL (ref 13.0–17.0)
Immature Granulocytes: 0 %
Lymphocytes Relative: 47 %
Lymphs Abs: 3.5 10*3/uL (ref 0.7–4.0)
MCH: 33.5 pg (ref 26.0–34.0)
MCHC: 33.6 g/dL (ref 30.0–36.0)
MCV: 99.5 fL (ref 80.0–100.0)
Monocytes Absolute: 0.6 10*3/uL (ref 0.1–1.0)
Monocytes Relative: 9 %
Neutro Abs: 2.9 10*3/uL (ref 1.7–7.7)
Neutrophils Relative %: 40 %
Platelets: 196 10*3/uL (ref 150–400)
RBC: 4.36 MIL/uL (ref 4.22–5.81)
RDW: 12.8 % (ref 11.5–15.5)
WBC: 7.3 10*3/uL (ref 4.0–10.5)
nRBC: 0 % (ref 0.0–0.2)

## 2019-01-07 LAB — BASIC METABOLIC PANEL
Anion gap: 9 (ref 5–15)
BUN: 18 mg/dL (ref 8–23)
CO2: 23 mmol/L (ref 22–32)
Calcium: 9.2 mg/dL (ref 8.9–10.3)
Chloride: 101 mmol/L (ref 98–111)
Creatinine, Ser: 0.98 mg/dL (ref 0.61–1.24)
GFR calc Af Amer: >60 mL/min (ref >60)
GFR calc non Af Amer: >60 mL/min (ref >60)
Glucose, Bld: 100 mg/dL — ABNORMAL HIGH (ref 70–99)
Potassium: 3.6 mmol/L (ref 3.5–5.1)
Sodium: 133 mmol/L — ABNORMAL LOW (ref 135–145)

## 2019-01-07 LAB — MAGNESIUM: Magnesium: 2.1 mg/dL (ref 1.7–2.4)

## 2019-01-07 MED ORDER — LATANOPROST 0.005 % OP SOLN
1.0000 [drp] | Freq: Every day | OPHTHALMIC | Status: DC
  Filled 2019-01-07 (×2): qty 2.5

## 2019-01-07 MED ORDER — HYDROCODONE-ACETAMINOPHEN 5-325 MG PO TABS: 1.0000 | ORAL_TABLET | ORAL | Status: DC | PRN

## 2019-01-07 MED ORDER — ENOXAPARIN SODIUM 40 MG/0.4ML ~~LOC~~ SOLN
40.0000 mg | SUBCUTANEOUS | Status: DC
  Administered 2019-01-07: 40 mg via SUBCUTANEOUS
  Filled 2019-01-07: qty 0.4

## 2019-01-07 MED ORDER — BRIMONIDINE TARTRATE 0.2 % OP SOLN
1.0000 [drp] | Freq: Two times a day (BID) | OPHTHALMIC | Status: DC
  Administered 2019-01-07 – 2019-01-08 (×2): 1 [drp] via OPHTHALMIC
  Filled 2019-01-07 (×3): qty 5

## 2019-01-07 MED ORDER — ASPIRIN 325 MG PO TABS
325.0000 mg | ORAL_TABLET | Freq: Every day | ORAL | Status: DC
  Administered 2019-01-08: 325 mg via ORAL
  Filled 2019-01-07: qty 1

## 2019-01-07 MED ORDER — BRINZOLAMIDE 1 % OP SUSP
1.0000 [drp] | Freq: Two times a day (BID) | OPHTHALMIC | Status: DC
  Administered 2019-01-08: 1 [drp] via OPHTHALMIC
  Filled 2019-01-07 (×2): qty 10

## 2019-01-07 MED ORDER — ONDANSETRON HCL 4 MG/2ML IJ SOLN: 4.0000 mg | Freq: Four times a day (QID) | INTRAMUSCULAR | Status: DC | PRN

## 2019-01-07 MED ORDER — AMLODIPINE BESYLATE 5 MG PO TABS
5.0000 mg | ORAL_TABLET | Freq: Every day | ORAL | Status: DC
  Administered 2019-01-08: 5 mg via ORAL
  Filled 2019-01-07: qty 1

## 2019-01-07 MED ORDER — LEVETIRACETAM IN NACL 1000 MG/100ML IV SOLN
1000.0000 mg | Freq: Once | INTRAVENOUS | Status: AC
  Administered 2019-01-07: 1000 mg via INTRAVENOUS
  Filled 2019-01-07: qty 100

## 2019-01-07 MED ORDER — POLYETHYLENE GLYCOL 3350 17 G PO PACK: 17.0000 g | PACK | Freq: Every day | ORAL | Status: DC | PRN

## 2019-01-07 MED ORDER — SODIUM CHLORIDE 0.9 % IV SOLN
INTRAVENOUS | Status: AC
  Administered 2019-01-07: 21:00:00 via INTRAVENOUS

## 2019-01-07 MED ORDER — ACETAMINOPHEN 325 MG PO TABS: 650.0000 mg | ORAL_TABLET | Freq: Four times a day (QID) | ORAL | Status: DC | PRN

## 2019-01-07 MED ORDER — ACETAMINOPHEN 650 MG RE SUPP: 650.0000 mg | Freq: Four times a day (QID) | RECTAL | Status: DC | PRN

## 2019-01-07 MED ORDER — ONDANSETRON HCL 4 MG PO TABS: 4.0000 mg | ORAL_TABLET | Freq: Four times a day (QID) | ORAL | Status: DC | PRN

## 2019-01-07 MED ORDER — LEVETIRACETAM 500 MG PO TABS
500.0000 mg | ORAL_TABLET | Freq: Two times a day (BID) | ORAL | Status: DC
  Administered 2019-01-08: 500 mg via ORAL
  Filled 2019-01-07 (×2): qty 1

## 2019-01-07 NOTE — ED Provider Notes (Signed)
Medical screening examination/treatment/procedure(s) were conducted as a shared visit with non-physician practitioner(s) and myself.  I personally evaluated the patient during the encounter.  Clinical Impression:   Final diagnoses:  Muscle fasciculation  Left leg numbness      The patient is a 78 year old male, he has a known history of prior brain aneurysm, history of focal seizures, history of ischemic stroke of the brain and is currently taking a half of an aspirin a daily.  He reports that last night around 8:00 in the evening he started to have some muscle twitching of his left leg.  He has not been able to get it to stop, it happened all night long and again into this morning and throughout the day.  He states that this is very abnormal but is reminiscent of his stroke that he had several years ago when he was having some abnormal myoclonic jerking of his left arm and was told that he had a stroke at that time.  The patient has not had any weakness that he is noted and in fact was able to ambulate into the emergency department stating that it only became difficult when he had some jerking of the leg while trying to walk.  On my exam the patient has clear heart and lung sounds, he has no distress, no cranial nerve abnormalities 3 through 12 and has normal visual acuity grossly.  His bilateral upper extremities have normal strength to extension flexion at the biceps triceps forearms and grips and shoulders.  He has normal sensation bilateral upper extremities and normal finger-nose-finger without any pronator drift.  His lower extremities have bilateral 5 out of 5 strength at the hips knees and ankles to both flexion and extension at each of these joints, there is a slight decrease sensation to light touch and pinprick down the left lower extremity in a stocking glove distribution.  He has a slight difficulty doing heel shin with his left foot on the right shin.  He is able to lift both legs into the  air for 10 seconds without dropping however there is some ongoing myoclonic jerking and muscle twitching that can be seen.  His memory is intact, I do not see any other significant findings on exam.  EKG interpreted by myself showing normal sinus rhythm, indeterminate axis, normal ST segments T waves, normal rhythm, normal rate.  Impression low voltage but otherwise unremarkable EKG.  The patient will need to have a stroke work-up including a CT scan of the brain, we will consult with neurology.  The neurologist with tele-neurology has seen the patient and recommended 1 g of Keppra as well as admission for work-up of possible ischemic stroke.  No other acute interventions recommended at this time, the patient is not a candidate for thrombolytic therapy given the time since onset and the mild symptoms.  We will discuss with the hospitalist once CT scan performed  Dr. Myna Hidalgo to admit   EKG Interpretation  Date/Time:  Thursday January 07 2019 16:23:22 EDT Ventricular Rate:  77 PR Interval:    QRS Duration: 105 QT Interval:  381 QTC Calculation: 432 R Axis:   -145 Text Interpretation:  Sinus rhythm Low voltage with right axis deviation since 4/19, no significant changes seen Confirmed by Noemi Chapel [phone]) on 01/07/2019 4:35:15 PM         Noemi Chapel, MD 01/11/19 1210

## 2019-01-07 NOTE — ED Provider Notes (Signed)
[facility] EMERGENCY DEPARTMENT Provider Note   CSN: [id number] Arrival date & time: 01/07/19  1550    History   Chief Complaint Chief Complaint  Patient presents with  . Weakness    HPI SAVERIO KADER is a 78 y.o. male with PMHx seizures, CVA, epidural abscess, HTN, who presents to the ED complaining of intermittent muscle twitching in his left leg that began last night. Pt reports he first noticed it while he was laying in bed last night. It has come and gone since then, mostly present while he is ambulating. He denies weakness in his leg; reports he has been able to ambulate with his walker without issue and walked into the ED today from his car. He has not tried anything for his symptoms. No recent injury to the leg. Denies fever, vision changes, headache, dizziness/lightheadedness, syncope, speech difficulty, back pain, or any other associated symptoms. Pt does have hx of stroke 3 years ago - he reports he was having similar twitching in his left arm at that point.        Past Medical History:  Diagnosis Date  . Arthritis    "generalized" (01/09/2018)  . Chronic lower back pain    "last couple months" (01/09/2018)  . Chronic sinus complaints   . 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 GERD (gastroesophageal reflux disease)   . H/O cerebral aneurysm repair 2016   "put stent in"  . High cholesterol   . History of blood transfusion ~ 1950   "while in hospital w/pneumonia" (01/09/2018)  . History of kidney stones   . Hypertension   . Nonruptured cerebral aneurysm   . Pneumonia ~ 1950  . Red man syndrome   . Seizures ([place]) 12/27/2015; 12/28/2015  . Stroke [facility]) 12/2015   denies residual on 01/09/2018)    Patient Active Problem List   Diagnosis Date Noted  . 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 Allergic reaction   . DRESS syndrome   . Pressure injury of skin 01/09/2018  . Adverse drug reaction 01/08/2018  . Dermatitis, drug-induced 01/08/2018  . 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 Epidural  abscess 12/17/2017  . Osteomyelitis of thoracic spine ([place]) 12/17/2017  . Rectal bleeding   . Constipation 12/16/2017  . Cerebrovascular accident (CVA) due to embolism of right middle cerebral artery ([place]) 03/06/2016  . S/P cerebral aneurysm repair 03/06/2016  . 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 Palpitations 01/05/2016  . Aneurysm, cerebral, nonruptured   . Hyperlipidemia   . Acute CVA (cerebrovascular accident) ([place]) 12/30/2015  . Acute right MCA stroke ([place])   . Focal seizure ([place]) 12/28/2015  . Essential hypertension 12/28/2015  . Hyponatremia 12/28/2015  . Cerebral aneurysm 05/04/2015    Past Surgical History:  Procedure Laterality Date  . CATARACT EXTRACTION W/ INTRAOCULAR LENS  IMPLANT, BILATERAL Bilateral   . COLONOSCOPY     2013 per patient: done in [employer], normal, next colonoscopy due in 10 years  . FOOT FRACTURE SURGERY Left    "pins, etc. in there"  . FRACTURE SURGERY    . RADIOLOGY WITH ANESTHESIA N/A 05/04/2015   Procedure: Pipeline Embolization;  Surgeon: Consuella Lose, MD;  Location: [facility];  Service: Radiology;  Laterality: N/A;  . RADIOLOGY WITH ANESTHESIA N/A 05/31/2015   Procedure: Pipeline Embolization;  Surgeon: Consuella Lose, MD;  Location: [place];  Service: Radiology;  Laterality: N/A;        Home Medications    Prior to Admission medications   Medication Sig Start Date End Date Taking?  Authorizing Provider  amLODipine (NORVASC) 5 MG tablet Take 1 tablet (5 mg total) by mouth daily. 12/18/17  Yes Johnson, Clanford L, MD  Ascorbic Acid (VITAMIN C) 1000 MG tablet Take 1,000 mg by mouth daily.   Yes [provider]  aspirin 325 MG tablet Take 325 mg by mouth daily.   Yes [provider]  Brinzolamide-Brimonidine (SIMBRINZA) 1-0.2 % SUSP Place 1 drop into both eyes 2 (two) times daily.    Yes [provider]  Cholecalciferol (VITAMIN D-1000 MAX ST) 1000 units tablet Take 1 tablet by mouth daily.   Yes [provider]  fluticasone (FLONASE) 50 MCG/ACT nasal spray Place 1 spray into the nose daily.   Yes [provider]  latanoprost (XALATAN) 0.005 % ophthalmic solution Place 1 drop into both eyes at bedtime.  12/26/18  Yes [provider]  Multiple Vitamin (MULTIVITAMIN) capsule Take 1 capsule by mouth daily.   Yes [provider]  multivitamin-lutein (OCUVITE-LUTEIN) CAPS capsule Take 1 capsule by mouth daily. 12/18/17  Yes Johnson, Clanford L, MD  vitamin E 400 UNIT capsule Take 400 Units by mouth daily.   Yes [provider]  daptomycin (CUBICIN) IVPB Inject 580 mg into the vein daily. Indication: Culture negative discitis / osteomyelitis Last Day of Therapy: 02/13/18 Labs - Once weekly:  CBC/D, BMP, and CPK Labs - Every other week:  ESR and CRP Patient not taking: Reported on 01/07/2019 01/17/18   Florencia Reasons, MD  doxycycline (VIBRA-TABS) 100 MG tablet Take 1 tablet (100 mg total) by mouth 2 (two) times daily. Patient not taking: Reported on 01/07/2019 01/20/18   Campbell Riches, MD  feeding supplement, ENSURE ENLIVE, (ENSURE ENLIVE) LIQD Take 237 mLs by mouth 2 (two) times daily between meals. Patient not taking: Reported on 01/07/2019 01/17/18   Florencia Reasons, MD  loratadine (CLARITIN) 10 MG tablet Take 1 tablet (10 mg total) by mouth daily for 5 days. Patient not taking: Reported on 01/07/2019 01/18/18 01/23/18  Florencia Reasons, MD  predniSONE (DELTASONE) 10 MG tablet Take 4tabs (68m) x1 on day one, then 2tab (268m)x1  on day two, then 1tab (1017mx1  on day three, then stop Patient not taking: Reported on 01/07/2019 01/17/18   Xu,Florencia ReasonsD    Family History Family History  Problem Relation Age of Onset  . Lymphoma Mother   . Dementia Mother   . Aneurysm Father   . Colon cancer Neg Hx     Social History Social History   Tobacco Use  . Smoking status: Current Every Day Smoker    Packs/day: 0.50    Years: 61.00    Pack years: 30.50    Types: Cigarettes  .  Smokeless tobacco: Never Used  Substance Use Topics  . Alcohol use: No    Alcohol/week: 0.0 standard drinks  . Drug use: No     Allergies   Cephalosporins and Vancomycin   Review of Systems Review of Systems  Constitutional: Negative for chills and fever.  HENT: Negative for congestion.   Eyes: Negative for visual disturbance.  Respiratory: Negative for shortness of breath.   Cardiovascular: Negative for chest pain.  Gastrointestinal: Negative for abdominal pain, diarrhea, nausea and vomiting.  Genitourinary: Negative for dysuria.  Musculoskeletal: Negative for arthralgias and back pain.       + Left leg spasms  Skin: Negative for rash.  Neurological: Negative for dizziness, syncope, speech difficulty, weakness, light-headedness, numbness and headaches.     Physical Exam Updated  Vital Signs BP (!) 152/99 (BP Location: Right Arm)   Pulse 84   Temp (!) 97.5 F (36.4 C) (Oral)   Resp 18   Ht _0  (1.727 m)   Wt 74.4 kg   SpO2 100%   BMI 24.94 kg/m   Physical Exam Vitals signs and nursing note reviewed.  Constitutional:      Appearance: He is not ill-appearing.  HENT:     Head: Normocephalic and atraumatic.  Eyes:     General: No scleral icterus.    Extraocular Movements: Extraocular movements intact.     Conjunctiva/sclera: Conjunctivae normal.     Pupils: Pupils are equal, round, and reactive to light.  Neck:     Musculoskeletal: Normal range of motion and neck supple.  Cardiovascular:     Rate and Rhythm: Normal rate and regular rhythm.     Pulses: Normal pulses.  Pulmonary:     Effort: Pulmonary effort is normal.     Breath sounds: Normal breath sounds. No wheezing, rhonchi or rales.  Abdominal:     Palpations: Abdomen is soft.     Tenderness: There is no abdominal tenderness. There is no guarding or rebound.  Musculoskeletal:        General: No swelling or tenderness.       Legs:     Comments: No overlying skin changes; no tenderness to medial  thigh where fasciculations are noted. No tenderness to palpation to joints of LLE.   Skin:    General: Skin is warm and dry.  Neurological:     Mental Status: He is alert and oriented to person, place, and time.     Comments: CN II-XII intact. Strength 5/5 in LLE with hip flexion, knee extension, dorsiflexion, and plantarflexion. Sensation intact although pt reports diminished sensation to left leg compared to right. Normal finger to nose. Negative pronator drift. Difficulty with heel to shin on left.      ED Treatments / Results  Labs (all labs ordered are listed, but only abnormal results are displayed) Labs Reviewed  BASIC METABOLIC PANEL - Abnormal; Notable for the following components:      Result Value   Sodium 133 (*)    Glucose, Bld 100 (*)    All other components within normal limits  MAGNESIUM  CBC WITH DIFFERENTIAL/PLATELET    EKG EKG Interpretation  Date/Time:  Thursday January 07 2019 16:23:22 EDT Ventricular Rate:  77 PR Interval:    QRS Duration: 105 QT Interval:  381 QTC Calculation: 432 R Axis:   -145 Text Interpretation:  Sinus rhythm Low voltage with right axis deviation since 4/19, no significant changes seen Confirmed by Noemi Chapel [phone]) on 01/07/2019 4:35:15 PM   Radiology Ct Head Wo Contrast  Result Date: 01/07/2019 CLINICAL DATA:  Leg weakness.  History of stroke.  Seizures. EXAM: CT HEAD WITHOUT CONTRAST TECHNIQUE: Contiguous axial images were obtained from the base of the skull through the vertex without intravenous contrast. COMPARISON:  12/30/2015 FINDINGS: Brain: Chronic extra-axial CSF accumulation is slightly greater on the right than left and similar over multiple prior exams. Advanced cerebral atrophy. Mild for age low density in the periventricular white matter likely related to small vessel disease. Right internal carotid artery stent is unchanged in position. No hemorrhage, hydrocephalus, acute infarct, intra-axial fluid collection.  Vascular: Intracranial atherosclerosis. Skull: No significant soft tissue swelling.  No skull fracture. Sinuses/Orbits: Normal imaged portions of the orbits and globes. Small volume left maxillary sinus fluid. Minimal ethmoid air cell mucosal  thickening. Clear mastoid air cells. Other: None. IMPRESSION: 1.  No acute intracranial abnormality. 2.  Cerebral atrophy and small vessel ischemic change. 3. Sinus disease. Electronically Signed   By: Abigail Miyamoto M.D.   On: 01/07/2019 18:39    Procedures Procedures (including critical care time)  Medications Ordered in ED Medications  levETIRAcetam (KEPPRA) IVPB 1000 mg/100 mL premix (0 mg Intravenous Stopped 01/07/19 1736)     Initial Impression / Assessment and Plan / ED Course  I have reviewed the triage vital signs and the nursing notes.  Pertinent labs & imaging results that were available during my care of the patient were reviewed by me and considered in my medical decision making (see chart for details).    Pt presenting with fasciculations of LLE; has no other complaints. Mildly concerning neuro exam findings with decreased sensation and dysmetria with heel to shin; will get CT Head and stat neuro consult; code stroke not called at this time given pt is out of the window for thrombolytics. Baseline labs ordered to rule out electrolyte abnormalities. EKG with NSR; no signs of ischemia.   Discussed case with tele neurology who will come and evaluate patient.   Neuro evaluated patient; suggests admission for stroke workup as well as starting patient on Keppra in case this is seizure related but keppra will also help with fasciculations. 1 g Keppra IV ordered. Awaiting CT Head prior to consulting hospitalist for admission.   CT Head with no acute abnormalities. Discussed case with hospitalist Dr. Myna Hidalgo who will admit patient for stroke workup.        Final Clinical Impressions(s) / ED Diagnoses   Final diagnoses:  Muscle fasciculation   Left leg numbness    ED Discharge Orders    None       Eustaquio Maize, Hershal Coria 01/07/19 2119    Noemi Chapel, MD 01/11/19 1210

## 2019-01-07 NOTE — ED Triage Notes (Signed)
Patient complaining of left leg weakness and "twitching" since last night.

## 2019-01-07 NOTE — H&P (Signed)
History and Physical    Damon Parrish LOV:[id number] DOB: Jun 14, 1941 DOA: 01/07/2019  PCP: Tobe Sos, MD   Patient coming from: Home   Chief Complaint: Left leg tremors and weakness   HPI: Damon Parrish is a 78 y.o. male with medical history significant for right MCA stroke, epidural abscess, hypertension, and focal seizure, now presenting to the emergency department for evaluation of involuntary left leg movements and left leg weakness.  Patient reports that he had been in his usual state of health until last night when, while laying in bed, he developed involuntary movements involving the left leg.  This has persisted intermittently throughout the day today.  He had similar symptoms involving the left arm approximately 3 years ago when he was found to have a right MCA stroke.  He reports being on Keppra for approximately 3 months after the stroke and never had a recurrence in the involuntary left arm movements.  He denies any headache, change in vision or hearing, numbness, or weakness involving any other area.  Denies any recent fevers, chills, new back pain, or incontinence.    ED Course: Upon arrival to the ED, patient is found to be afebrile, saturating well on room air, and with remaining vitals also normal.  EKG features a sinus rhythm and noncontrast head CT is negative for acute intracranial abnormality.  Chemistry panel is notable for slight hyponatremia and CBC is unremarkable.  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  Review of Systems:  All other systems reviewed and apart from HPI, are negative.  Past Medical History:  Diagnosis Date  . Arthritis    "generalized" (01/09/2018)  . Chronic lower back pain    "last couple months" (01/09/2018)  .  Chronic sinus complaints   . 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 GERD (gastroesophageal reflux disease)   . H/O cerebral aneurysm repair 2016   "put stent in"  . High cholesterol   . History of blood transfusion ~ 1950   "while in hospital w/pneumonia" (01/09/2018)  . History of kidney stones   . Hypertension   . Nonruptured cerebral aneurysm   . Pneumonia ~ 1950  . Red man syndrome   . Seizures ([place]) 12/27/2015; 12/28/2015  . Stroke [facility]) 12/2015   denies residual on 01/09/2018)    Past Surgical History:  Procedure Laterality Date  . CATARACT EXTRACTION W/ INTRAOCULAR LENS  IMPLANT, BILATERAL Bilateral   . COLONOSCOPY     2013 per patient: done in [place], normal, next colonoscopy due in 10 years  . FOOT FRACTURE SURGERY Left    "pins, etc. in there"  . FRACTURE SURGERY    . RADIOLOGY WITH ANESTHESIA N/A 05/04/2015   Procedure: Pipeline Embolization;  Surgeon: Consuella Lose, MD;  Location: [facility];  Service: Radiology;  Laterality: N/A;  . RADIOLOGY WITH ANESTHESIA N/A 05/31/2015   Procedure: Pipeline Embolization;  Surgeon: Consuella Lose, MD;  Location: [place];  Service: Radiology;  Laterality: N/A;     reports that he has been smoking cigarettes. He has a 30.50 pack-year smoking history. He has never used smokeless tobacco. He reports that he does not drink alcohol or use drugs.  Allergies  Allergen Reactions  . Cephalosporins Rash    Full body rash with systemic symptoms  . Vancomycin Rash  Full body rash with systemic symptoms    Family History  Problem Relation Age of Onset  . Lymphoma Mother   . Dementia Mother   . Aneurysm Father   . Colon cancer Neg Hx      Prior to Admission medications   Medication Sig Start Date End Date Taking? Authorizing Provider  amLODipine (NORVASC) 5 MG tablet Take 1 tablet (5 mg total) by mouth daily. 12/18/17  Yes Johnson, Clanford L, MD  Ascorbic Acid (VITAMIN C) 1000 MG tablet Take 1,000 mg by mouth daily.   Yes [provider]  aspirin 325 MG tablet Take 325 mg by mouth daily.   Yes [provider]  Brinzolamide-Brimonidine (SIMBRINZA) 1-0.2 % SUSP Place 1 drop into both eyes 2 (two) times daily.    Yes [provider]  Cholecalciferol (VITAMIN D-1000 MAX ST) 1000 units tablet Take 1 tablet by mouth daily.   Yes [provider]  fluticasone (FLONASE) 50 MCG/ACT nasal spray Place 1 spray into the nose daily.   Yes [provider]  latanoprost (XALATAN) 0.005 % ophthalmic solution Place 1 drop into both eyes at bedtime.  12/26/18  Yes [provider]  Multiple Vitamin (MULTIVITAMIN) capsule Take 1 capsule by mouth daily.   Yes [provider]  multivitamin-lutein (OCUVITE-LUTEIN) CAPS capsule Take 1 capsule by mouth daily. 12/18/17  Yes Johnson, Clanford L, MD  vitamin E 400 UNIT capsule Take 400 Units by mouth daily.   Yes [provider]  feeding supplement, ENSURE ENLIVE, (ENSURE ENLIVE) LIQD Take 237 mLs by mouth 2 (two) times daily between meals. Patient not taking: Reported on 01/07/2019 01/17/18   Florencia Reasons, MD  loratadine (CLARITIN) 10 MG tablet Take 1 tablet (10 mg total) by mouth daily for 5 days. Patient not taking: Reported on 01/07/2019 01/18/18 01/23/18  Florencia Reasons, MD    Physical Exam: Vitals:   01/07/19 1800 01/07/19 1830 01/07/19 1900 01/07/19 1930  BP: (!) 145/86 (!) 143/81 (!) 147/91 (!) 156/91  Pulse: 64 68 69 62  Resp: 14 15 15 15   Temp:      TempSrc:      SpO2: 98% 98% 100% 93%  Weight:      Height:        Constitutional: NAD, calm  Eyes: PERTLA, lids and conjunctivae normal ENMT: Mucous membranes are moist. Posterior pharynx clear of any exudate or lesions.   Neck: normal, supple, no masses, no thyromegaly Respiratory: clear to auscultation bilaterally, no wheezing, no crackles. No accessory muscle use.  Cardiovascular: S1 & S2 heard, regular rate and rhythm. No extremity edema.   Abdomen: No distension, no tenderness,  soft. Bowel sounds active.  Musculoskeletal: no clubbing / cyanosis. No joint deformity upper and lower extremities.   Skin: no significant rashes, lesions, ulcers. Warm, dry, well-perfused. Neurologic: CN 2-12 grossly intact. Sensation to light touch diminished in LLE. Strength 5/5 in all 4 limbs.  Psychiatric: Alert and oriented to person, place, and situation. Calm, cooperative.    Labs on Admission: I have personally reviewed following labs and imaging studies  CBC: Recent Labs  Lab 01/07/19 1630  WBC 7.3  NEUTROABS 2.9  HGB 14.6  HCT 43.4  MCV 99.5  PLT 935   Basic Metabolic Panel: Recent Labs  Lab 01/07/19 1630  NA 133*  K 3.6  CL 101  CO2 23  GLUCOSE 100*  BUN 18  CREATININE 0.98  CALCIUM 9.2  MG 2.1   GFR: Estimated Creatinine Clearance: 60.1 mL/min (  by C-G formula based on SCr of 0.98 mg/dL). Liver Function Tests: No results for input(s): AST, ALT, ALKPHOS, BILITOT, PROT, ALBUMIN in the last 168 hours. No results for input(s): LIPASE, AMYLASE in the last 168 hours. No results for input(s): AMMONIA in the last 168 hours. Coagulation Profile: No results for input(s): INR, PROTIME in the last 168 hours. Cardiac Enzymes: No results for input(s): CKTOTAL, CKMB, CKMBINDEX, TROPONINI in the last 168 hours. BNP (last 3 results) No results for input(s): PROBNP in the last 8760 hours. HbA1C: No results for input(s): HGBA1C in the last 72 hours. CBG: No results for input(s): GLUCAP in the last 168 hours. Lipid Profile: No results for input(s): CHOL, HDL, LDLCALC, TRIG, CHOLHDL, LDLDIRECT in the last 72 hours. Thyroid Function Tests: No results for input(s): TSH, T4TOTAL, FREET4, T3FREE, THYROIDAB in the last 72 hours. Anemia Panel: No results for input(s): VITAMINB12, FOLATE, FERRITIN, TIBC, IRON, RETICCTPCT in the last 72 hours. Urine analysis:    Component Value Date/Time   COLORURINE AMBER (A) 01/08/2018 0419   APPEARANCEUR CLOUDY (A) 01/08/2018 0419    LABSPEC 1.027 01/08/2018 0419   PHURINE 6.0 01/08/2018 0419   GLUCOSEU NEGATIVE 01/08/2018 0419   HGBUR NEGATIVE 01/08/2018 0419   BILIRUBINUR NEGATIVE 01/08/2018 0419   KETONESUR 5 (A) 01/08/2018 0419   PROTEINUR 100 (A) 01/08/2018 0419   UROBILINOGEN 0.2 05/04/2015 1043   NITRITE NEGATIVE 01/08/2018 0419   LEUKOCYTESUR NEGATIVE 01/08/2018 0419   Sepsis Labs: @LABRCNTIP (procalcitonin:4,lacticidven:4) )No results found for this or any previous visit (from the past 240 hour(s)).   Radiological Exams on Admission: Ct Head Wo Contrast  Result Date: 01/07/2019 CLINICAL DATA:  Leg weakness.  History of stroke.  Seizures. EXAM: CT HEAD WITHOUT CONTRAST TECHNIQUE: Contiguous axial images were obtained from the base of the skull through the vertex without intravenous contrast. COMPARISON:  12/30/2015 FINDINGS: Brain: Chronic extra-axial CSF accumulation is slightly greater on the right than left and similar over multiple prior exams. Advanced cerebral atrophy. Mild for age low density in the periventricular white matter likely related to small vessel disease. Right internal carotid artery stent is unchanged in position. No hemorrhage, hydrocephalus, acute infarct, intra-axial fluid collection. Vascular: Intracranial atherosclerosis. Skull: No significant soft tissue swelling.  No skull fracture. Sinuses/Orbits: Normal imaged portions of the orbits and globes. Small volume left maxillary sinus fluid. Minimal ethmoid air cell mucosal thickening. Clear mastoid air cells. Other: None. IMPRESSION: 1.  No acute intracranial abnormality. 2.  Cerebral atrophy and small vessel ischemic change. 3. Sinus disease. Electronically Signed   By: Abigail Miyamoto M.D.   On: 01/07/2019 18:39    EKG: Independently reviewed. Sinus rhythm.   Assessment/Plan   1. Focal motor seizure  - Presents with involuntary LLE movements with LLE numbness and weakness that has been intermittent since night of 01/06/19  - CT head  negative for acute findings  - Teleneurologist suspects focal seizure, less likely acute ischemic CVA  - He was treated with 1 g IV Keppra in ED - Continue cardiac monitoring, neuro checks, Keppra 500 mg BID, and check MRI brain with and without contrast, and EEG    2. History of CVA  - MRI brain and euro checks as above  - Continue ASA    3. Hypertension  - BP at goal  - Continue Norvasc    4. Hyponatremia  - Serum sodium is 133 on admission, appears to be chronic and not suspected to be contributing to his neurologic sxs  PPE: Mask, face shield  DVT prophylaxis: Lovenox  Code Status: DNR  Family Communication: Discussed with patient  Consults called: Neurology  Admission status: Observation     Vianne Bulls, MD [facility] Pager [phone]  If 7PM-7AM, please contact night-coverage [email or web address] Password [email or web address]  01/07/2019, 8:11 PM

## 2019-01-07 NOTE — Consult Note (Signed)
[facility] Tele[facility]urology Consult Services  This patient was seen as a STAT consult for abnormal movements  Impression:  Abnormal involuntary movements of left LE -- semiology appears consistent with focal motor seizure, though non epileptic myoclonus cannot be excluded    -Rule out subcortical basal ganglia stroke given abnormal movements, sensory loss and ataxia, though less likely  History of left sided seizures 2/2 R MCA stroke  History of R ICA aneurysm s/p stent  Recommendations:  Givevn history of seizures now off Keppra, and examintion suggesting seizure vs myoclonus, reasonable to treat with 1 g Keppra now to see if this is suppressed  In mean time admit for MRI brain with and without contrast, EEG and neurology consultation to re-evaluate Can continue Keppra 500 mg BID and investigate other trigger for these presumed breakthrough focal motor seizures  Lower suspicion that this represents a stroke, although acute ischemic stroke involving the basal ganglia can sometimes present with similar symptoms including abnormal involuntary movements/ataxia, so a subcortical stroke also in differential  Discussed with patient and ED physician.  Call with questions  Darrel Reach, MD [facility]  ---------------------------------------------------------------------  CC: abnormal involuntary movements  History of Present Illness:  78 yo M hx R ICA aneurysm s/p stent, subdural hematomas and R frontoparietal stroke with seizures all in 2017, who reports that his left leg hs been jumping intermittently when he gets up to walk, but is happening at rest as well.  He was last well before 10p last night before the movements started. Today it is still ongoing so he came for evaluation.  He was found to have left lower extremity drift as well as ataxia.  Evaluation of his records show that he had a right frontoparietal stroke as well as bilateral subdurals back in 2017.  At that time he  had intermittent seiizures involving the left arm and leg for which he was on Keppra for some time before it was weaned off by neurology.  He has not had any seizures since. He has not had any residual issues from the stroke at baseline either.   Diagnostic Testing: N/a MRI 2017: IMPRESSION: Patchy acute ischemia 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  Involutional changes and minimal chronic small vessel ischemic disease.  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  Vital Signs:   reviewed  Exam:  Mental Status:  Awake, alert, oriented  Naming: Intact Repetition: Intact   Speech: fluent  Cranial Nerves:  Pupils: Equal round  Extraocular movements: Intact in all cardinal gaze Ptosis: Absent Facial sensation: Intact Facial movements: Intact and symmetric    Motor Exam:  No drift except left arm and leg mild  Sensation:  Decreased to LT on left leg   Tremor/Abnormal Movements: Semirhythmic and rhythmic left lower extremity jerking approx 1-2hz , not supressible, occurs at rest as well as activity  Resting tremor: Absent Intention tremor: Absent Postural tremor: Absent    Coordination:   Finger to nose: Intact Heel to shin: Ataxic on LLE   Medical Decision Making:  - Extensive number of diagnosis or management options are considered above.   - Extensive amount of complex data reviewed.   - High risk of complication and/or morbidity or mortality are associated with differential diagnostic considerations above.  - There may be uncertain outcome and increased probability of prolonged functional impairment or high probability of severe prolonged functional  impairment associated with some of these differential diagnosis.   Medical Data Reviewed:  1.Data reviewed include clinical labs, radiology,  Medical  Tests;   2.Tests results discussed w/performing or interpreting physician;   3.Obtaining/reviewing old medical records;  4.Obtaining case history from another source;  5.Independent review of image, tracing or specimen.    Patient was informed the Neurology Consult would happen via telehealth (remote video) and consented to receiving care in this manner.

## 2019-01-07 NOTE — ED Notes (Signed)
Called for Stat Neurology consult as requested by Dr Sabra Heck.  Cart placed in the rm by RN.

## 2019-01-07 NOTE — Care Management Obs Status (Signed)
[place] NOTIFICATION   Patient Details  Name: Damon Parrish MRN: [id number] Date of Birth: 17-Jun-1941   Medicare Observation Status Notification Given:  Yes    Shantavia Jha Dimitri Ped, LCSW 01/07/2019, 10:33 PM

## 2019-01-08 ENCOUNTER — Observation Stay (HOSPITAL_COMMUNITY): Payer: Medicare Other

## 2019-01-08 DIAGNOSIS — R253 Fasciculation: Secondary | ICD-10-CM | POA: Diagnosis not present

## 2019-01-08 DIAGNOSIS — I10 Essential (primary) hypertension: Secondary | ICD-10-CM | POA: Diagnosis not present

## 2019-01-08 DIAGNOSIS — R569 Unspecified convulsions: Secondary | ICD-10-CM

## 2019-01-08 DIAGNOSIS — R200 Anesthesia of skin: Secondary | ICD-10-CM

## 2019-01-08 DIAGNOSIS — G40109 Localization-related (focal) (partial) symptomatic epilepsy and epileptic syndromes with simple partial seizures, not intractable, without status epilepticus: Secondary | ICD-10-CM | POA: Diagnosis not present

## 2019-01-08 DIAGNOSIS — I63411 Cerebral infarction due to embolism of right middle cerebral artery: Secondary | ICD-10-CM | POA: Diagnosis not present

## 2019-01-08 LAB — CBC
HCT: 40.5 % (ref 39.0–52.0)
Hemoglobin: 13.4 g/dL (ref 13.0–17.0)
MCH: 32.9 pg (ref 26.0–34.0)
MCHC: 33.1 g/dL (ref 30.0–36.0)
MCV: 99.5 fL (ref 80.0–100.0)
Platelets: 191 10*3/uL (ref 150–400)
RBC: 4.07 MIL/uL — ABNORMAL LOW (ref 4.22–5.81)
RDW: 12.9 % (ref 11.5–15.5)
WBC: 7.2 10*3/uL (ref 4.0–10.5)
nRBC: 0 % (ref 0.0–0.2)

## 2019-01-08 LAB — BASIC METABOLIC PANEL
Anion gap: 7 (ref 5–15)
BUN: 14 mg/dL (ref 8–23)
CO2: 22 mmol/L (ref 22–32)
Calcium: 8.6 mg/dL — ABNORMAL LOW (ref 8.9–10.3)
Chloride: 104 mmol/L (ref 98–111)
Creatinine, Ser: 0.81 mg/dL (ref 0.61–1.24)
GFR calc Af Amer: >60 mL/min (ref >60)
GFR calc non Af Amer: >60 mL/min (ref >60)
Glucose, Bld: 94 mg/dL (ref 70–99)
Potassium: 3.7 mmol/L (ref 3.5–5.1)
Sodium: 133 mmol/L — ABNORMAL LOW (ref 135–145)

## 2019-01-08 MED ORDER — LEVETIRACETAM 500 MG PO TABS: 500.0000 mg | ORAL_TABLET | Freq: Two times a day (BID) | ORAL | 1 refills | Status: DC

## 2019-01-08 NOTE — Plan of Care (Signed)
  Problem: Education: Goal: Knowledge of General Education information will improve Description Including pain rating scale, medication(s)/side effects and non-pharmacologic comfort measures 01/08/2019 1407 by Rance Muir, RN Outcome: Adequate for Discharge 01/08/2019 0953 by Rance Muir, RN Outcome: Progressing   Problem: Health Behavior/Discharge Planning: Goal: Ability to manage health-related needs will improve 01/08/2019 1407 by Rance Muir, RN Outcome: Adequate for Discharge 01/08/2019 0953 by Rance Muir, RN Outcome: Progressing   Problem: Clinical Measurements: Goal: Ability to maintain clinical measurements within normal limits will improve 01/08/2019 1407 by Rance Muir, RN Outcome: Adequate for Discharge 01/08/2019 0953 by Rance Muir, RN Outcome: Progressing Goal: Will remain free from infection 01/08/2019 1407 by Rance Muir, RN Outcome: Adequate for Discharge 01/08/2019 0953 by Rance Muir, RN Outcome: Progressing Goal: Diagnostic test results will improve 01/08/2019 1407 by Rance Muir, RN Outcome: Adequate for Discharge 01/08/2019 0953 by Rance Muir, RN Outcome: Progressing Goal: Respiratory complications will improve 01/08/2019 1407 by Rance Muir, RN Outcome: Adequate for Discharge 01/08/2019 0953 by Rance Muir, RN Outcome: Progressing Goal: Cardiovascular complication will be avoided 01/08/2019 1407 by Rance Muir, RN Outcome: Adequate for Discharge 01/08/2019 0953 by Rance Muir, RN Outcome: Progressing   Problem: Activity: Goal: Risk for activity intolerance will decrease 01/08/2019 1407 by Rance Muir, RN Outcome: Adequate for Discharge 01/08/2019 0953 by Rance Muir, RN Outcome: Progressing   Problem: Nutrition: Goal: Adequate nutrition will be maintained 01/08/2019 1407 by Rance Muir, RN Outcome: Adequate for Discharge 01/08/2019 0953 by Rance Muir, RN Outcome: Progressing   Problem: Coping: Goal: Level of anxiety will decrease 01/08/2019 1407 by Rance Muir, RN Outcome: Adequate for  Discharge 01/08/2019 0953 by Rance Muir, RN Outcome: Progressing   Problem: Elimination: Goal: Will not experience complications related to bowel motility 01/08/2019 1407 by Rance Muir, RN Outcome: Adequate for Discharge 01/08/2019 [place] by Rance Muir, RN Outcome: Progressing Goal: Will not experience complications related to urinary retention 01/08/2019 1407 by Rance Muir, RN Outcome: Adequate for Discharge 01/08/2019 0953 by Rance Muir, RN Outcome: Progressing   Problem: Pain Managment: Goal: General experience of comfort will improve 01/08/2019 1407 by Rance Muir, RN Outcome: Adequate for Discharge 01/08/2019 0953 by Rance Muir, RN Outcome: Progressing   Problem: Safety: Goal: Ability to remain free from injury will improve 01/08/2019 1407 by Rance Muir, RN Outcome: Adequate for Discharge 01/08/2019 0953 by Rance Muir, RN Outcome: Progressing   Problem: Skin Integrity: Goal: Risk for impaired skin integrity will decrease 01/08/2019 1407 by Rance Muir, RN Outcome: Adequate for Discharge 01/08/2019 [place] by Rance Muir, RN Outcome: Progressing

## 2019-01-08 NOTE — Plan of Care (Signed)

## 2019-01-08 NOTE — Discharge Summary (Signed)
Physician Discharge Summary  Damon Parrish NOM:[id number] DOB: 1940/12/15 DOA: 01/07/2019  PCP: Tobe Sos, MD  Admit date: 01/07/2019 Discharge date: 01/08/2019  Time spent: 35 minutes  Recommendations for Outpatient Follow-up:  1. Reassess blood pressure and adjust medication as needed 2. Repeat basic metabolic panel to follow electrolytes 3. Outpatient follow-up with neurology in 6 weeks to further determine need of long-term antiepileptic drugs and adjust therapy as needed.  Outpatient EEG can be decided at that time.  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  Patient discharged home with instruction to follow-up with neurology in 6 weeks.  Patient also instructed to follow-up with PCP in 10 days after discharge.  Diet recommendation: Heart healthy diet  Filed Weights   01/07/19 1558 01/07/19 2120  Weight: 74.4 kg 71.7 kg    History of present illness:  As per H&P written by Dr. Myna Hidalgo on 01/07/2019 78 y.o. male with medical history significant for right MCA stroke, epidural abscess, hypertension, and focal seizure, now presenting to the emergency department for evaluation of involuntary left leg movements and left leg weakness.  Patient reports that he had been in his usual state of health until last night when, while laying in bed, he developed involuntary movements involving the left leg.  This has persisted intermittently throughout the day today.  He had similar symptoms involving the left arm approximately 3 years ago when he was found to have a right MCA stroke.  He reports being on Keppra for approximately 3 months after the stroke and never had a recurrence in the involuntary left arm  movements.  He denies any headache, change in vision or hearing, numbness, or weakness involving any other area.  Denies any recent fevers, chills, new back pain, or incontinence.    ED Course: Upon arrival to the ED, patient is found to be afebrile, saturating well on room air, and with remaining vitals also normal.  EKG features a sinus rhythm and noncontrast head CT is negative for acute intracranial abnormality.  Chemistry panel is notable for slight hyponatremia and CBC is unremarkable.  Patient was evaluated by telemetry neurologist in the emergency department, focal motor seizure is suspected, and recommendations were given for 1 g IV Keppra followed by 500 mg twice daily, MRI brain with and without contrast, EEG, and inpatient neurology consultation.  Hospital Course 1-focal motor seizure -Patient with involuntary left lower extremity movement and left lower extremity numbness/weakness that has been intermittent for the last 48 hours prior to admission. -Due to metal implant on his left side MRI was unable to be pursued. -Case discussed with neurology who has recommended discharge and Keppra 500 mg twice a day -CT scan demonstrated no acute intracranial abnormalities; but demonstrated old strokes. -Patient declined EEG and expressed that after receiving medication he felt back to his baseline and would like to go home.  He was in agreement to follow-up with neurology as an outpatient.  2-prior history of CVA -No other focal deficits appreciated -Continue blood pressure control on full dose aspirin for secondary prevention -CT scan of the head without any acute intracranial changes.  3-high blood pressure -Stable and well-controlled -Continue the use of Norvasc -Patient advised to follow heart healthy diet.  4-hyponatremia -  Not new and Asymptomatic from it. -Sodium at discharge remained stable at 133  5-history of glaucoma -Continue latanoprost and Simbrinza  6-allergy  rhinitis -Overall stable -Continue use of Flonase and Claritin.  Procedures:  Below for x-ray reports  EEG: Declined by patient  Consultations:  Neurology: (Dr. Merlene Laughter; recommended discharge on Keppra 500 mg twice a day and outpatient follow-up with in 6 weeks).  Discharge Exam: Vitals:   01/07/19 2120 01/08/19 0546  BP: (!) 160/88 123/84  Pulse: 65 71  Resp: 20 20  Temp: 98.2 F (36.8 C) (!) 97.5 F (36.4 C)  SpO2: 99% 97%    General: Afebrile, no chest pain, no nausea, no vomiting, no shortness of breath.  No further focal fasciculation/unconscious movement on his left leg.  Reports feeling back to his baseline and ready to go home. Cardiovascular: S1 and S2, no rubs, no gallops, no murmurs. Respiratory: Clear to auscultation bilaterally Abdomen: Soft, nontender, nondistended, positive bowel sounds Extremities: No cyanosis, no clubbing, no edema on exam.  Discharge Instructions   Discharge Instructions    Discharge instructions   Complete by:  As directed    Take medications as prescribed Maintain adequate hydration Outpatient follow-up with neurology service in 6 weeks Follow-up with PCP in 10 days. Follow heart healthy diet.     Allergies as of 01/08/2019      Reactions   Cephalosporins Rash   Full body rash with systemic symptoms   Vancomycin Rash   Full body rash with systemic symptoms      Medication List    STOP taking these medications   feeding supplement (ENSURE ENLIVE) Liqd     TAKE these medications   amLODipine 5 MG tablet Commonly known as:  NORVASC Take 1 tablet (5 mg total) by mouth daily.   aspirin 325 MG tablet Take 325 mg by mouth daily.   fluticasone 50 MCG/ACT nasal spray Commonly known as:  FLONASE Place 1 spray into the nose daily.   latanoprost 0.005 % ophthalmic solution Commonly known as:  XALATAN Place 1 drop into both eyes at bedtime.   levETIRAcetam 500 MG tablet Commonly known as:  KEPPRA Take 1 tablet (500  mg total) by mouth 2 (two) times daily.   loratadine 10 MG tablet Commonly known as:  CLARITIN Take 1 tablet (10 mg total) by mouth daily for 5 days.   multivitamin capsule Take 1 capsule by mouth daily.   multivitamin-lutein Caps capsule Take 1 capsule by mouth daily.   Simbrinza 1-0.2 % Susp Generic drug:  Brinzolamide-Brimonidine Place 1 drop into both eyes 2 (two) times daily.   vitamin C 1000 MG tablet Take 1,000 mg by mouth daily.   Vitamin D-1000 Max St 25 MCG (1000 UT) tablet Generic drug:  Cholecalciferol Take 1 tablet by mouth daily.   vitamin E 400 UNIT capsule Take 400 Units by mouth daily.      Allergies  Allergen Reactions  . Cephalosporins Rash    Full body rash with systemic symptoms  . Vancomycin Rash    Full body rash with systemic symptoms   Follow-up Information    Pradhan, Tilden Fossa, MD. Schedule an appointment as soon as possible for a visit in 10 day(s).   Specialty:  Internal Medicine Contact information: [address] [place] [address] [phone]        Phillips Odor, MD. Schedule an appointment as soon as possible for a visit in 6 week(s).   Specialty:  Neurology Contact information: [address] [place] [address] [phone]           The results of significant diagnostics from this hospitalization (including imaging, microbiology, ancillary and laboratory) are listed below for reference.    Significant Diagnostic Studies: Ct Head Wo Contrast  Result Date: 01/07/2019 CLINICAL DATA:  Leg weakness.  History of stroke.  Seizures. EXAM: CT HEAD WITHOUT CONTRAST TECHNIQUE: Contiguous axial images were obtained from the base of the skull through the vertex without intravenous contrast. COMPARISON:  12/30/2015 FINDINGS: Brain: Chronic extra-axial CSF accumulation is slightly greater on the right than left and similar over multiple prior exams. Advanced cerebral atrophy. Mild for age low density in the periventricular  white matter likely related to small vessel disease. Right internal carotid artery stent is unchanged in position. No hemorrhage, hydrocephalus, acute infarct, intra-axial fluid collection. Vascular: Intracranial atherosclerosis. Skull: No significant soft tissue swelling.  No skull fracture. Sinuses/Orbits: Normal imaged portions of the orbits and globes. Small volume left maxillary sinus fluid. Minimal ethmoid air cell mucosal thickening. Clear mastoid air cells. Other: None. IMPRESSION: 1.  No acute intracranial abnormality. 2.  Cerebral atrophy and small vessel ischemic change. 3. Sinus disease. Electronically Signed   By: Abigail Miyamoto M.D.   On: 01/07/2019 18:39    Microbiology: No results found for this or any previous visit (from the past 240 hour(s)).   Labs: Basic Metabolic Panel: Recent Labs  Lab 01/07/19 1630 01/08/19 0453  NA 133* 133*  K 3.6 3.7  CL 101 104  CO2 23 22  GLUCOSE 100* 94  BUN 18 14  CREATININE 0.98 0.81  CALCIUM 9.2 8.6*  MG 2.1  --    CBC: Recent Labs  Lab 01/07/19 1630 01/08/19 0453  WBC 7.3 7.2  NEUTROABS 2.9  --   HGB 14.6 13.4  HCT 43.4 40.5  MCV 99.5 99.5  PLT 196 191   Signed:  Barton Dubois MD.  [facility] 01/08/2019, 11:49 AM

## 2019-02-18 ENCOUNTER — Other Ambulatory Visit: Payer: Self-pay | Admitting: Neurology

## 2019-02-18 ENCOUNTER — Other Ambulatory Visit (HOSPITAL_COMMUNITY): Payer: Self-pay | Admitting: Neurology

## 2019-02-18 DIAGNOSIS — I6529 Occlusion and stenosis of unspecified carotid artery: Secondary | ICD-10-CM

## 2019-02-23 ENCOUNTER — Ambulatory Visit (HOSPITAL_COMMUNITY)
Admission: RE | Admit: 2019-02-23 | Discharge: 2019-02-23 | Disposition: A | Discharge status: Home / Self Care | Payer: Medicare Other | Source: Ambulatory Visit | Attending: Neurology | Admitting: Neurology

## 2019-02-23 ENCOUNTER — Other Ambulatory Visit: Payer: Self-pay

## 2019-02-23 DIAGNOSIS — I6529 Occlusion and stenosis of unspecified carotid artery: Secondary | ICD-10-CM | POA: Diagnosis present

## 2019-04-23 ENCOUNTER — Other Ambulatory Visit: Payer: Medicare Other

## 2019-04-23 ENCOUNTER — Other Ambulatory Visit: Payer: Self-pay

## 2019-04-23 DIAGNOSIS — Z20822 Contact with and (suspected) exposure to covid-19: Secondary | ICD-10-CM

## 2019-04-25 LAB — NOVEL CORONAVIRUS, NAA: SARS-CoV-2, NAA: NOT DETECTED

## 2019-09-02 IMAGING — CT CT HEAD WITHOUT CONTRAST
3 series · 15 of 47 positions shown, 18 images · non-contrast
Comparison: 12/30/2015

CLINICAL DATA: Leg weakness.  History of stroke.  Seizures.

EXAM:
CT HEAD WITHOUT CONTRAST
TECHNIQUE: Contiguous axial images were obtained from the base of the skull
through the vertex without intravenous contrast.

[Series 2: head wo · axial · 0.48mm/px · z∈[-29,+111]mm · 9 of 34 slices shown, 12 images]
[im 3/34  brain]
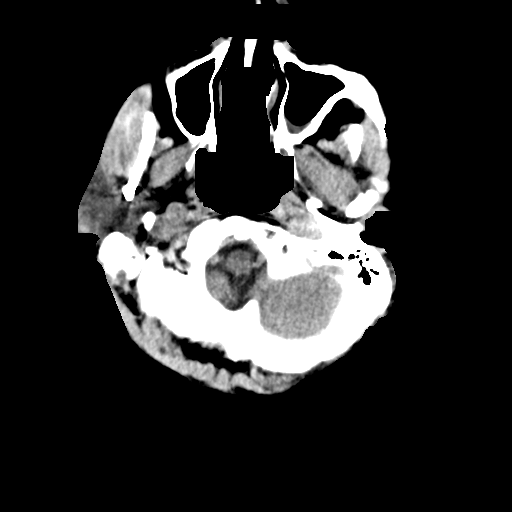
[im 3/34  bone]
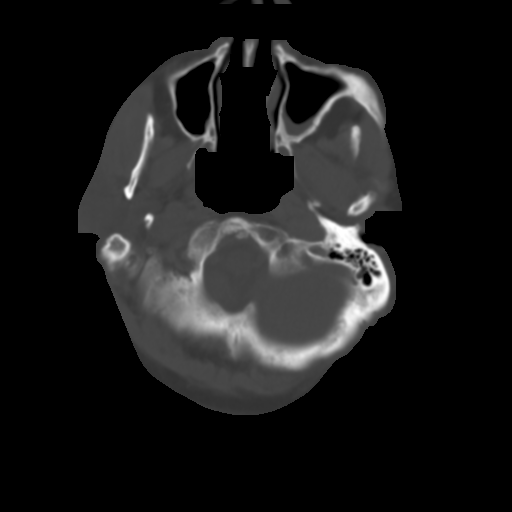
[im 6/34  brain]
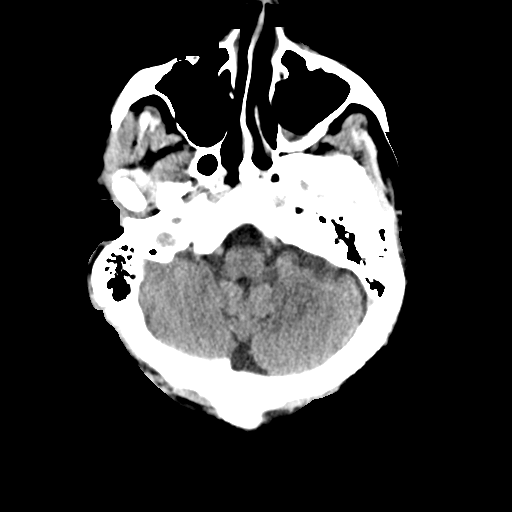
[im 10/34  brain]
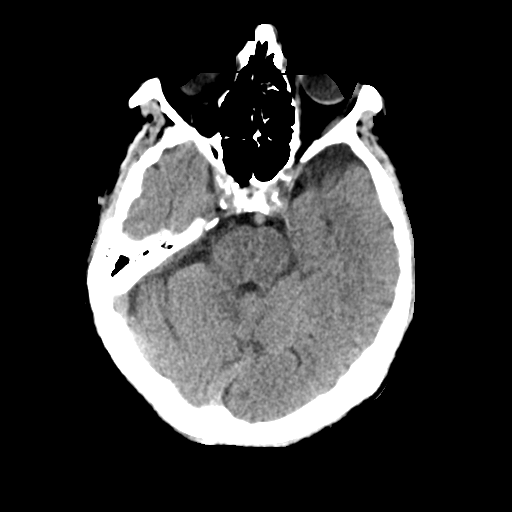
[im 13/34  brain]
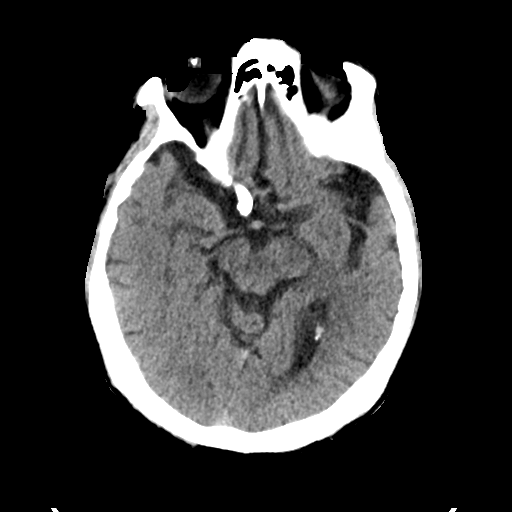
[im 18/34  brain]
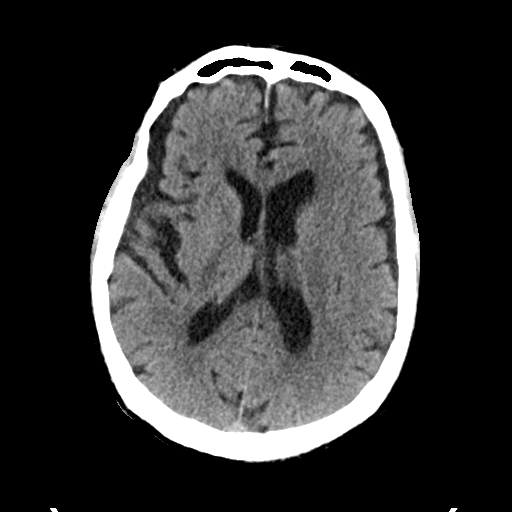
[im 18/34  bone]
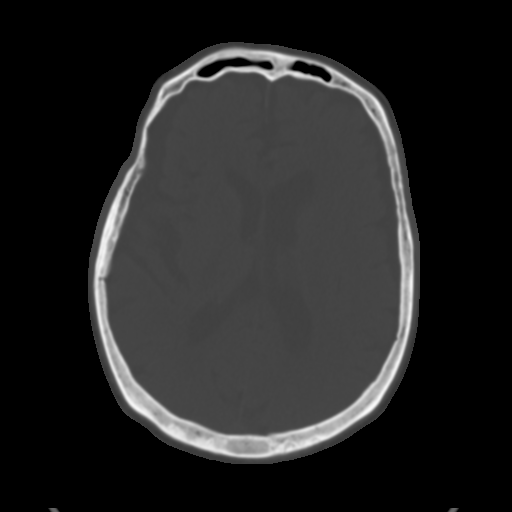
[im 21/34  brain]
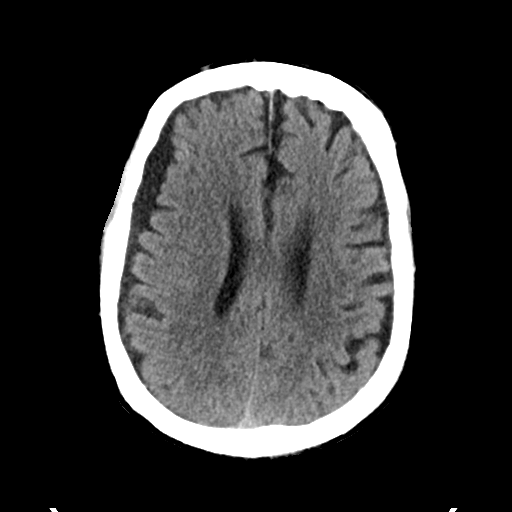
[im 24/34  brain]
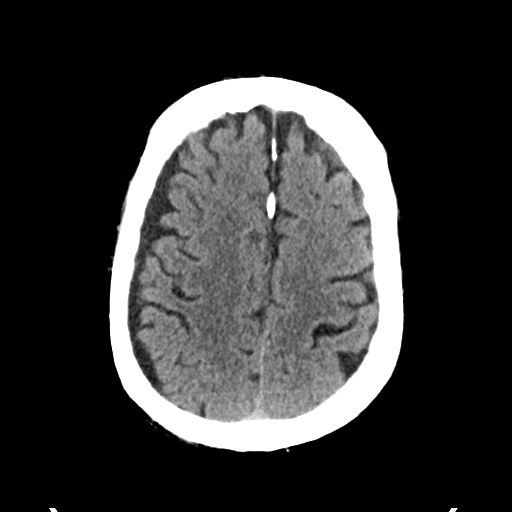
[im 28/34  brain]
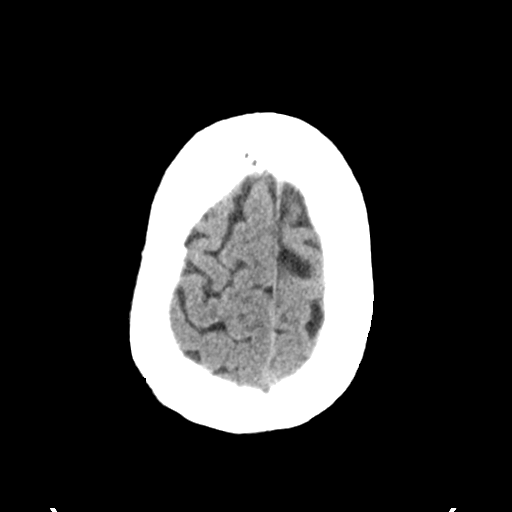
[im 31/34  brain]
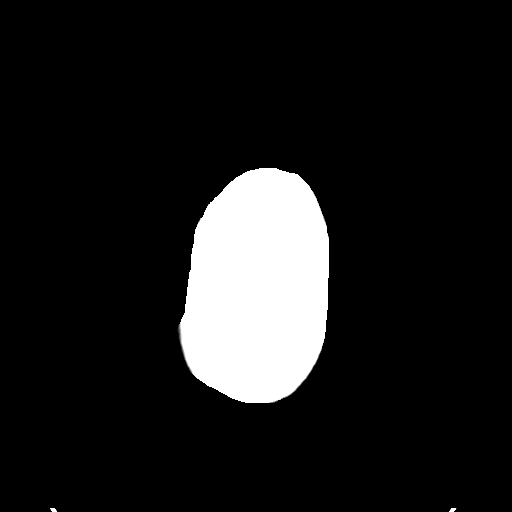
[im 31/34  bone]
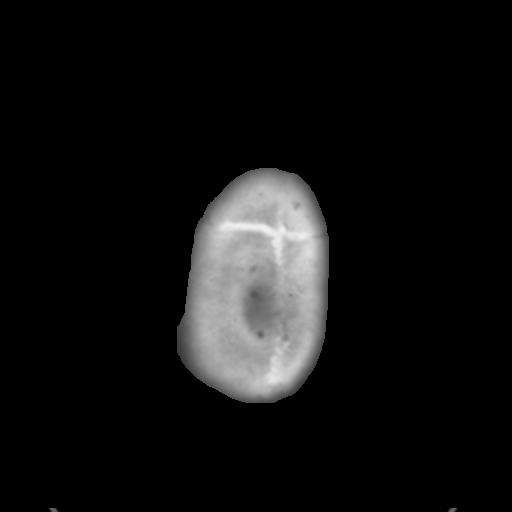

[Series 4: coronal soft tissue · coronal · 0.37mm/px · 3 of 79 slices shown]
[im 27/79  brain]
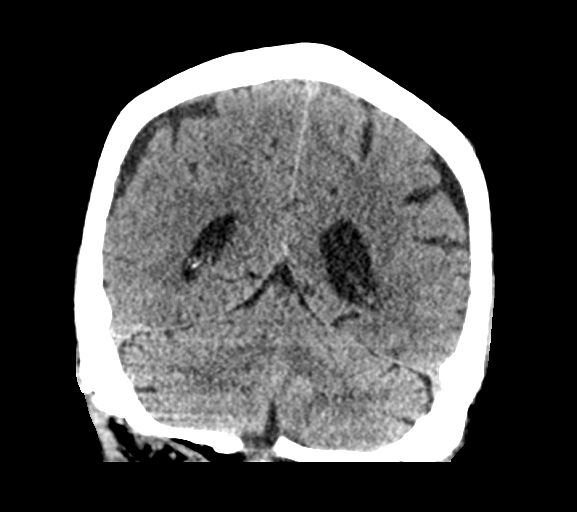
[im 35/79  brain]
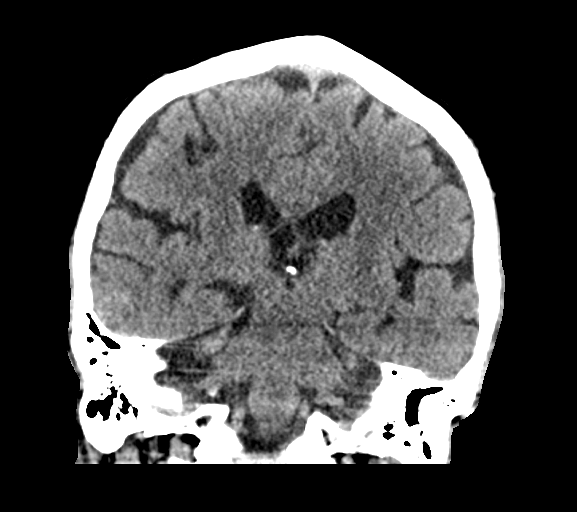
[im 44/79  brain]
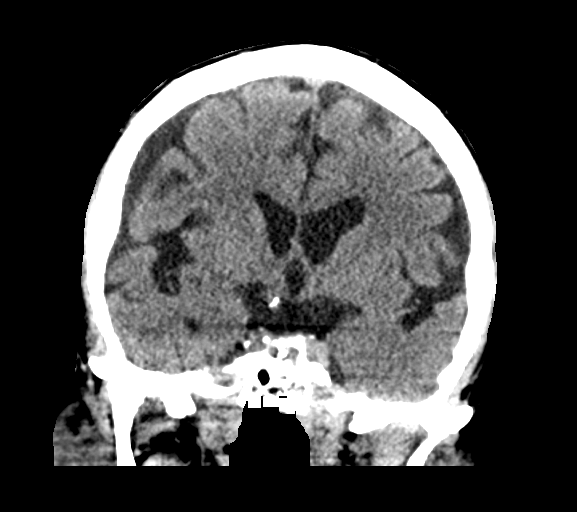

[Series 5: sagittal soft tissue · sagittal · 0.39mm/px · 3 of 66 slices shown]
[im 22/66  brain]
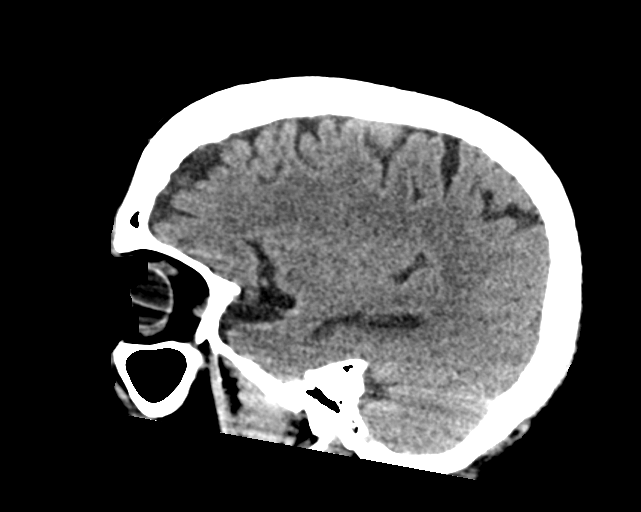
[im 33/66  brain]
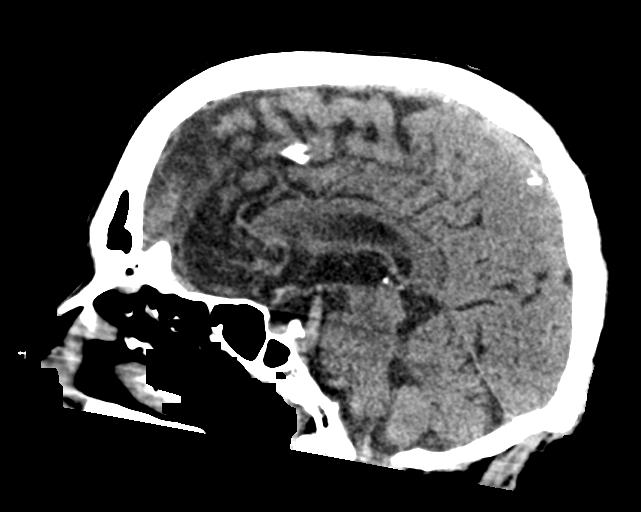
[im 44/66  brain]
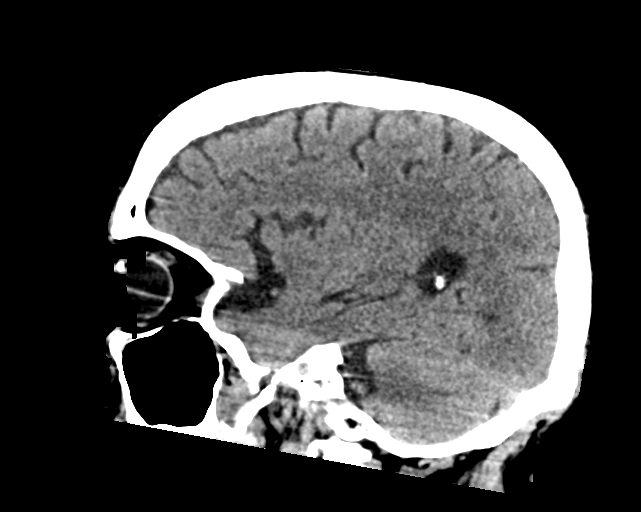

[15 of 47 positions shown; findings below may reference images not displayed]

FINDINGS: Brain: Chronic extra-axial CSF accumulation is slightly greater on
the right than left and similar over multiple prior exams.

Advanced cerebral atrophy. Mild for age low density in the
periventricular white matter likely related to small vessel disease.
Right internal carotid artery stent is unchanged in position. No
hemorrhage, hydrocephalus, acute infarct, intra-axial fluid
collection.

Vascular: Intracranial atherosclerosis.

Skull: No significant soft tissue swelling.  No skull fracture.

Sinuses/Orbits: Normal imaged portions of the orbits and globes.
Small volume left maxillary sinus fluid. Minimal ethmoid air cell
mucosal thickening. Clear mastoid air cells.

Other: None.
IMPRESSION: 1.  No acute intracranial abnormality.
2.  Cerebral atrophy and small vessel ischemic change.
3. Sinus disease.

## 2019-09-07 ENCOUNTER — Other Ambulatory Visit: Payer: Self-pay

## 2019-09-07 ENCOUNTER — Ambulatory Visit: Payer: Medicare Other | Attending: Internal Medicine

## 2019-09-07 DIAGNOSIS — Z20822 Contact with and (suspected) exposure to covid-19: Secondary | ICD-10-CM

## 2019-09-08 LAB — NOVEL CORONAVIRUS, NAA: SARS-CoV-2, NAA: NOT DETECTED

## 2019-10-14 ENCOUNTER — Ambulatory Visit: Payer: Medicare Other | Attending: Internal Medicine

## 2019-10-14 DIAGNOSIS — Z23 Encounter for immunization: Secondary | ICD-10-CM | POA: Insufficient documentation

## 2019-10-14 NOTE — Progress Notes (Signed)
   Covid-19 Vaccination Clinic  Name:  OSHA KEMPNER    MRN: [id number] DOB: 04-27-1941  10/14/2019  Mr. Gerges was observed post Covid-19 immunization for 15 minutes without incidence. He was provided with Vaccine Information Sheet and instruction to access the V-Safe system.   Mr. Richner was instructed to call 911 with any severe reactions post vaccine: Marland Kitchen Difficulty breathing  . Swelling of your face and throat  . A fast heartbeat  . A bad rash all over your body  . Dizziness and weakness    Immunizations Administered    Name Date Dose VIS Date Route   Pfizer COVID-19 Vaccine 10/14/2019  8:51 AM 0.3 mL 09/03/2019 Intramuscular   Manufacturer: [place]   Lot: [id number]   [place]: [id number]

## 2019-10-19 IMAGING — US BILATERAL CAROTID DUPLEX ULTRASOUND
1 series · 13 of 24 positions shown · non-contrast
Comparison: None.

CLINICAL DATA: Cerebral infarction

EXAM:
BILATERAL CAROTID DUPLEX ULTRASOUND
TECHNIQUE: Gray scale imaging, color Doppler and duplex ultrasound were
performed of bilateral carotid and vertebral arteries in the neck.

[Series 1: bilateral carotid duplex ultrasound · 0.06mm/px · 13 of 115 slices shown]
[im 1/115]
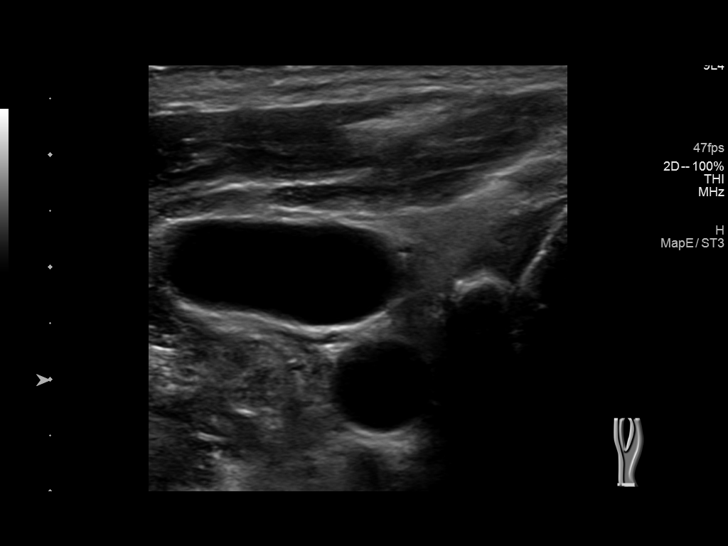
[im 10/115]
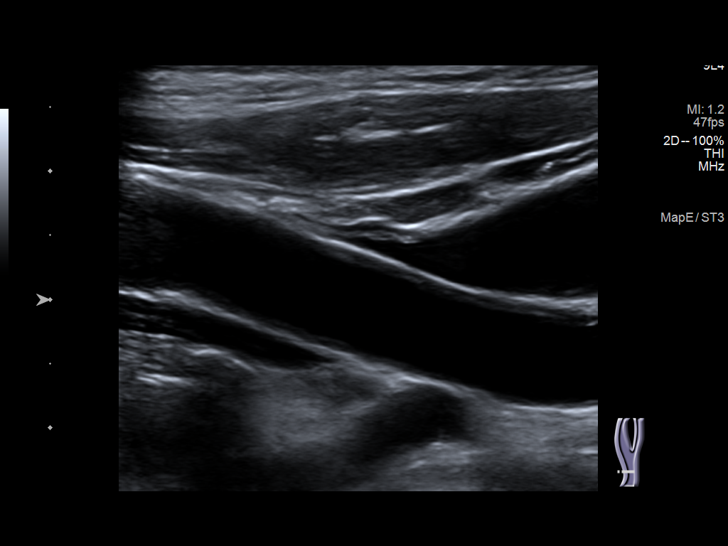
[im 20/115]
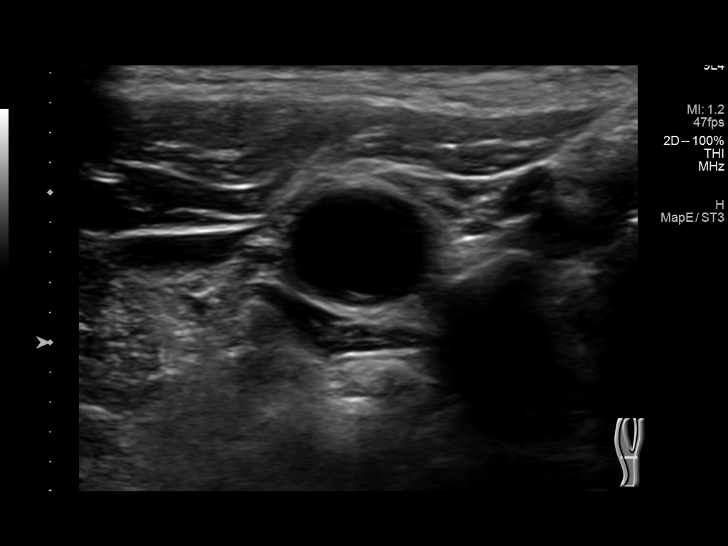
[im 30/115]
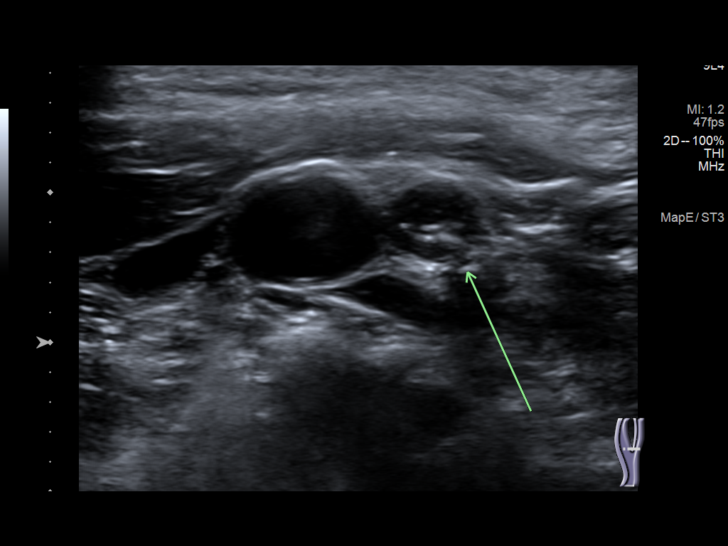
[im 40/115]
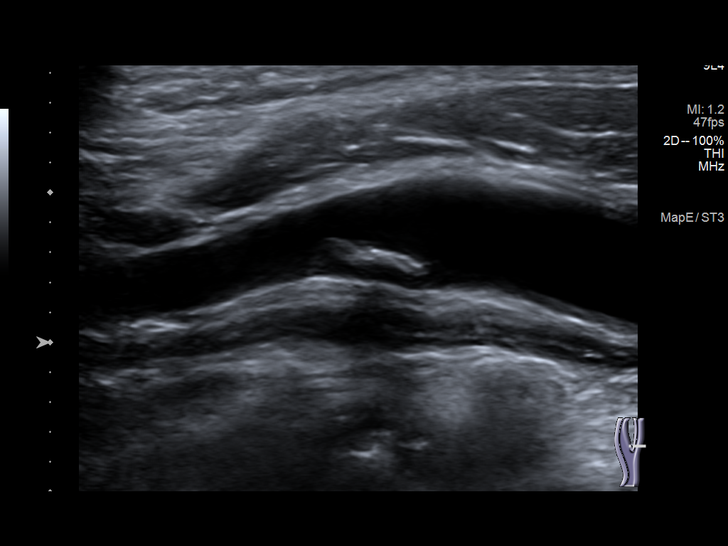
[im 50/115]
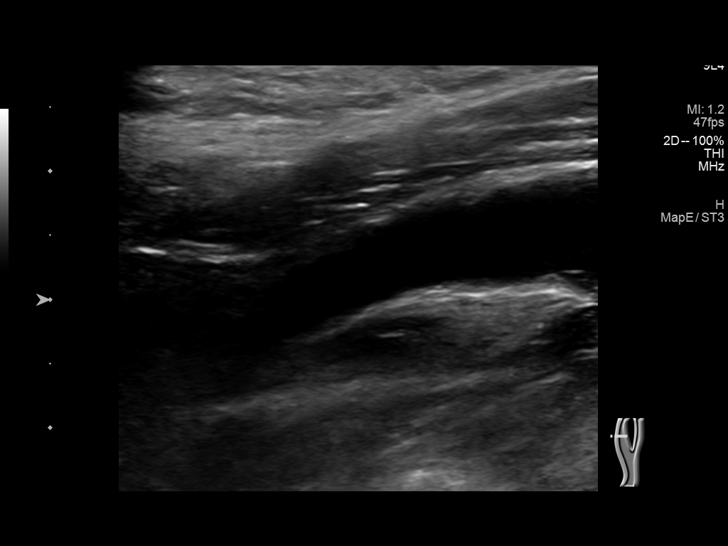
[im 60/115]
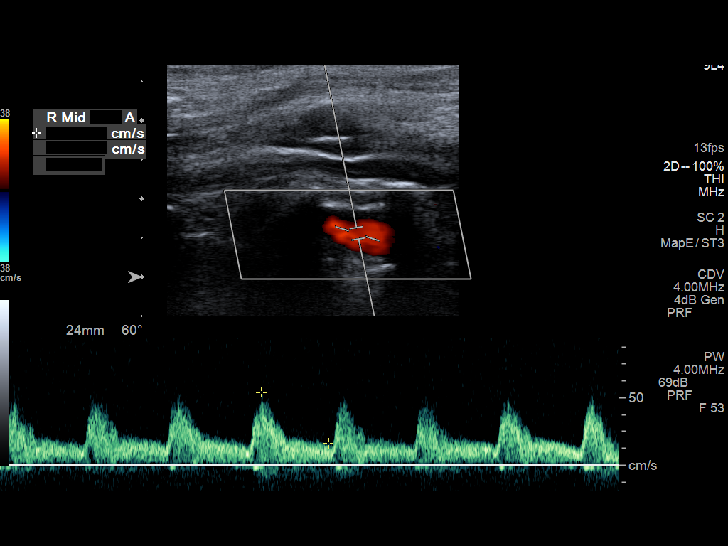
[im 65/115]
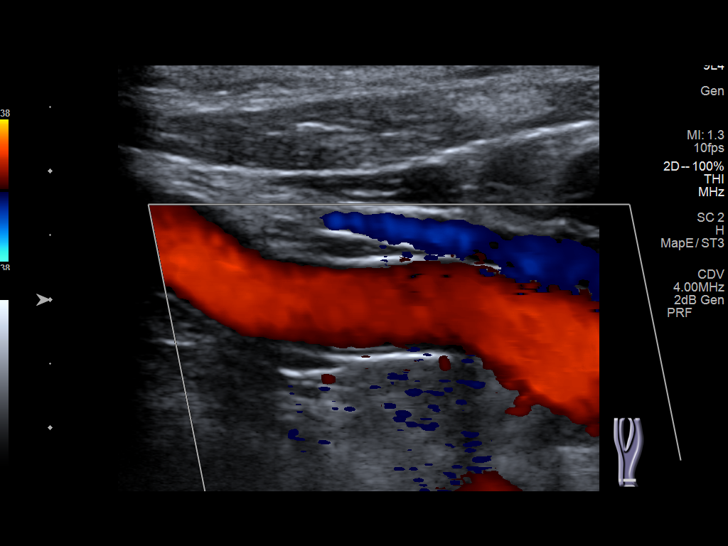
[im 75/115]
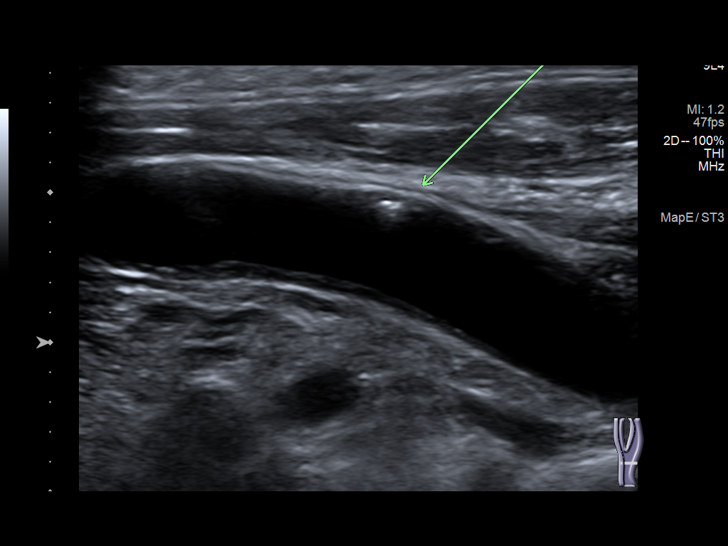
[im 85/115]
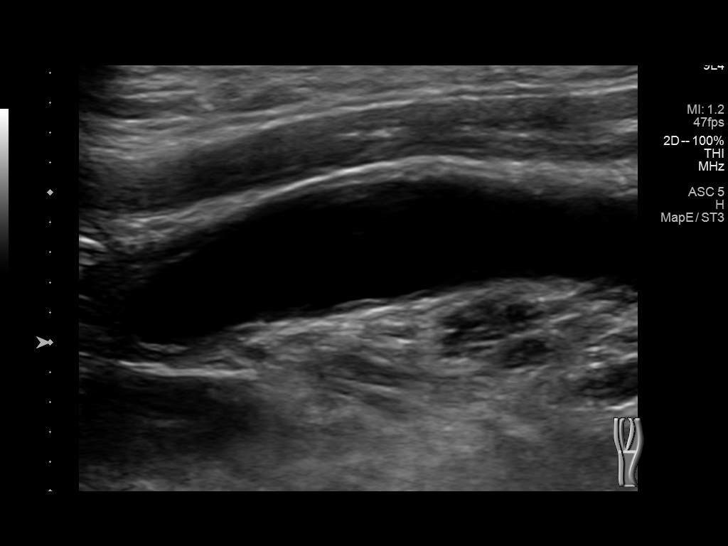
[im 95/115]
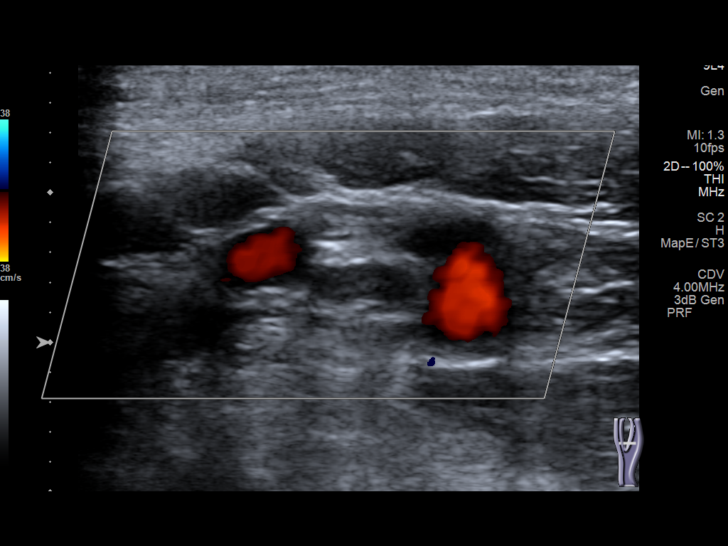
[im 105/115]
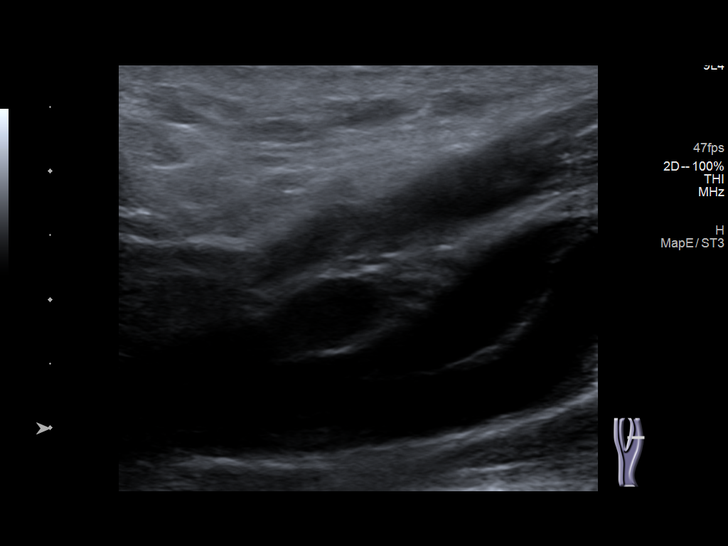
[im 115/115]
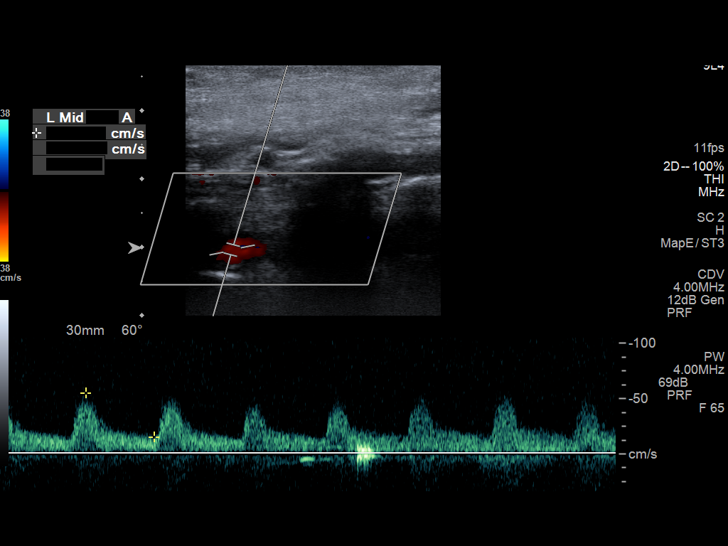

[13 of 24 positions shown; findings below may reference images not displayed]

FINDINGS: Criteria: Quantification of carotid stenosis is based on velocity
parameters that correlate the residual internal carotid diameter
with NASCET-based stenosis levels, using the diameter of the distal
internal carotid lumen as the denominator for stenosis measurement.

The following velocity measurements were obtained:

RIGHT

ICA: 89 cm/sec

CCA: 110 cm/sec

SYSTOLIC ICA/CCA RATIO:

ECA: 176 cm/sec

LEFT

ICA: 81 cm/sec

CCA: 118 cm/sec

SYSTOLIC ICA/CCA RATIO:

ECA: 88 cm/sec

RIGHT CAROTID ARTERY: Moderate focal calcified plaque in the bulb.
Low resistance internal carotid Doppler pattern is preserved.

RIGHT VERTEBRAL ARTERY:  Antegrade.

LEFT CAROTID ARTERY: Minimal focal calcified plaque in the upper
common carotid artery. Little if any plaque in the bulb. Low
resistance internal carotid Doppler pattern is preserved.

LEFT VERTEBRAL ARTERY:  Antegrade.
IMPRESSION: There is less than 50% stenosis in the right and left internal
carotid arteries.

## 2019-11-04 ENCOUNTER — Ambulatory Visit: Payer: Medicare Other | Attending: Internal Medicine

## 2019-11-04 DIAGNOSIS — Z23 Encounter for immunization: Secondary | ICD-10-CM | POA: Insufficient documentation

## 2019-11-04 NOTE — Progress Notes (Signed)
   Covid-19 Vaccination Clinic  Name:  Damon Parrish    MRN: [id number] DOB: June 21, 1941  11/04/2019  [name] was observed post Covid-19 immunization for 15 minutes without incidence. He was provided with Vaccine Information Sheet and instruction to access the V-Safe system.   [name] was instructed to call 911 with any severe reactions post vaccine: Marland Kitchen Difficulty breathing  . Swelling of your face and throat  . A fast heartbeat  . A bad rash all over your body  . Dizziness and weakness    Immunizations Administered    Name Date Dose VIS Date Route   Pfizer COVID-19 Vaccine 11/04/2019  8:18 AM 0.3 mL 09/03/2019 Intramuscular   Manufacturer: [place]   Lot: [id number]   [place]: [id number]

## 2022-06-17 HISTORY — PX: EYE SURGERY: SHX253

## 2022-11-07 ENCOUNTER — Encounter: Payer: Self-pay | Admitting: Neurology

## 2022-11-13 ENCOUNTER — Inpatient Hospital Stay (HOSPITAL_COMMUNITY)
Admission: EM | Admit: 2022-11-13 | Discharge: 2022-11-15 | DRG: 040 | Disposition: A | Discharge status: Rehabilitation Facility | Payer: Medicare Other | Attending: Internal Medicine | Admitting: Internal Medicine

## 2022-11-13 ENCOUNTER — Emergency Department (HOSPITAL_COMMUNITY): Payer: Medicare Other

## 2022-11-13 ENCOUNTER — Observation Stay (HOSPITAL_COMMUNITY): Payer: Medicare Other

## 2022-11-13 ENCOUNTER — Other Ambulatory Visit: Payer: Self-pay

## 2022-11-13 DIAGNOSIS — M545 Low back pain, unspecified: Secondary | ICD-10-CM | POA: Diagnosis present

## 2022-11-13 DIAGNOSIS — Z1152 Encounter for screening for COVID-19: Secondary | ICD-10-CM

## 2022-11-13 DIAGNOSIS — R451 Restlessness and agitation: Secondary | ICD-10-CM | POA: Diagnosis not present

## 2022-11-13 DIAGNOSIS — I639 Cerebral infarction, unspecified: Secondary | ICD-10-CM | POA: Diagnosis not present

## 2022-11-13 DIAGNOSIS — Z881 Allergy status to other antibiotic agents status: Secondary | ICD-10-CM

## 2022-11-13 DIAGNOSIS — Z961 Presence of intraocular lens: Secondary | ICD-10-CM | POA: Diagnosis present

## 2022-11-13 DIAGNOSIS — R29702 NIHSS score 2: Secondary | ICD-10-CM | POA: Diagnosis present

## 2022-11-13 DIAGNOSIS — R059 Cough, unspecified: Secondary | ICD-10-CM

## 2022-11-13 DIAGNOSIS — E7800 Pure hypercholesterolemia, unspecified: Secondary | ICD-10-CM | POA: Diagnosis present

## 2022-11-13 DIAGNOSIS — I700 Atherosclerosis of aorta: Secondary | ICD-10-CM | POA: Diagnosis present

## 2022-11-13 DIAGNOSIS — M1990 Unspecified osteoarthritis, unspecified site: Secondary | ICD-10-CM | POA: Diagnosis present

## 2022-11-13 DIAGNOSIS — R29810 Facial weakness: Secondary | ICD-10-CM | POA: Diagnosis present

## 2022-11-13 DIAGNOSIS — R531 Weakness: Principal | ICD-10-CM

## 2022-11-13 DIAGNOSIS — Z7982 Long term (current) use of aspirin: Secondary | ICD-10-CM

## 2022-11-13 DIAGNOSIS — Z716 Tobacco abuse counseling: Secondary | ICD-10-CM

## 2022-11-13 DIAGNOSIS — I63511 Cerebral infarction due to unspecified occlusion or stenosis of right middle cerebral artery: Secondary | ICD-10-CM

## 2022-11-13 DIAGNOSIS — I6389 Other cerebral infarction: Secondary | ICD-10-CM | POA: Diagnosis not present

## 2022-11-13 DIAGNOSIS — R569 Unspecified convulsions: Secondary | ICD-10-CM

## 2022-11-13 DIAGNOSIS — Z8673 Personal history of transient ischemic attack (TIA), and cerebral infarction without residual deficits: Secondary | ICD-10-CM

## 2022-11-13 DIAGNOSIS — R911 Solitary pulmonary nodule: Secondary | ICD-10-CM

## 2022-11-13 DIAGNOSIS — I6203 Nontraumatic chronic subdural hemorrhage: Secondary | ICD-10-CM | POA: Diagnosis present

## 2022-11-13 DIAGNOSIS — E871 Hypo-osmolality and hyponatremia: Secondary | ICD-10-CM | POA: Diagnosis present

## 2022-11-13 DIAGNOSIS — W19XXXA Unspecified fall, initial encounter: Secondary | ICD-10-CM | POA: Diagnosis present

## 2022-11-13 DIAGNOSIS — Z79899 Other long term (current) drug therapy: Secondary | ICD-10-CM

## 2022-11-13 DIAGNOSIS — G40909 Epilepsy, unspecified, not intractable, without status epilepticus: Secondary | ICD-10-CM

## 2022-11-13 DIAGNOSIS — J439 Emphysema, unspecified: Secondary | ICD-10-CM | POA: Diagnosis present

## 2022-11-13 DIAGNOSIS — R270 Ataxia, unspecified: Secondary | ICD-10-CM | POA: Diagnosis present

## 2022-11-13 DIAGNOSIS — I10 Essential (primary) hypertension: Secondary | ICD-10-CM | POA: Insufficient documentation

## 2022-11-13 DIAGNOSIS — G8929 Other chronic pain: Secondary | ICD-10-CM | POA: Diagnosis present

## 2022-11-13 DIAGNOSIS — H409 Unspecified glaucoma: Secondary | ICD-10-CM | POA: Diagnosis present

## 2022-11-13 DIAGNOSIS — K219 Gastro-esophageal reflux disease without esophagitis: Secondary | ICD-10-CM | POA: Diagnosis present

## 2022-11-13 DIAGNOSIS — Z87442 Personal history of urinary calculi: Secondary | ICD-10-CM

## 2022-11-13 DIAGNOSIS — F17210 Nicotine dependence, cigarettes, uncomplicated: Secondary | ICD-10-CM | POA: Diagnosis present

## 2022-11-13 DIAGNOSIS — G8194 Hemiplegia, unspecified affecting left nondominant side: Secondary | ICD-10-CM | POA: Diagnosis present

## 2022-11-13 LAB — URINALYSIS, ROUTINE W REFLEX MICROSCOPIC
Bilirubin Urine: NEGATIVE
Glucose, UA: NEGATIVE mg/dL
Hgb urine dipstick: NEGATIVE
Ketones, ur: NEGATIVE mg/dL
Leukocytes,Ua: NEGATIVE
Nitrite: NEGATIVE
Protein, ur: NEGATIVE mg/dL
Specific Gravity, Urine: 1.029 (ref 1.005–1.030)
pH: 7 (ref 5.0–8.0)

## 2022-11-13 LAB — DIFFERENTIAL
Abs Immature Granulocytes: 0.04 10*3/uL (ref 0.00–0.07)
Basophils Absolute: 0 10*3/uL (ref 0.0–0.1)
Basophils Relative: 0 %
Eosinophils Absolute: 0.2 10*3/uL (ref 0.0–0.5)
Eosinophils Relative: 2 %
Immature Granulocytes: 0 %
Lymphocytes Relative: 33 %
Lymphs Abs: 3.2 10*3/uL (ref 0.7–4.0)
Monocytes Absolute: 0.6 10*3/uL (ref 0.1–1.0)
Monocytes Relative: 7 %
Neutro Abs: 5.6 10*3/uL (ref 1.7–7.7)
Neutrophils Relative %: 58 %

## 2022-11-13 LAB — HEMOGLOBIN A1C
Hgb A1c MFr Bld: 6.1 % — ABNORMAL HIGH (ref 4.8–5.6)
Mean Plasma Glucose: 128.37 mg/dL

## 2022-11-13 LAB — APTT: aPTT: 33 seconds (ref 24–36)

## 2022-11-13 LAB — RAPID URINE DRUG SCREEN, HOSP PERFORMED
Amphetamines: NOT DETECTED
Barbiturates: NOT DETECTED
Benzodiazepines: NOT DETECTED
Cocaine: NOT DETECTED
Opiates: NOT DETECTED
Tetrahydrocannabinol: NOT DETECTED

## 2022-11-13 LAB — COMPREHENSIVE METABOLIC PANEL
ALT: 27 U/L (ref 0–44)
AST: 33 U/L (ref 15–41)
Albumin: 4 g/dL (ref 3.5–5.0)
Alkaline Phosphatase: 106 U/L (ref 38–126)
Anion gap: 11 (ref 5–15)
BUN: 18 mg/dL (ref 8–23)
CO2: 22 mmol/L (ref 22–32)
Calcium: 9.5 mg/dL (ref 8.9–10.3)
Chloride: 98 mmol/L (ref 98–111)
Creatinine, Ser: 1.01 mg/dL (ref 0.61–1.24)
GFR, Estimated: >60 mL/min (ref >60)
Glucose, Bld: 130 mg/dL — ABNORMAL HIGH (ref 70–99)
Potassium: 4.3 mmol/L (ref 3.5–5.1)
Sodium: 131 mmol/L — ABNORMAL LOW (ref 135–145)
Total Bilirubin: 1 mg/dL (ref 0.3–1.2)
Total Protein: 6.7 g/dL (ref 6.5–8.1)

## 2022-11-13 LAB — CBC
HCT: 43.6 % (ref 39.0–52.0)
Hemoglobin: 15.3 g/dL (ref 13.0–17.0)
MCH: 34.5 pg — ABNORMAL HIGH (ref 26.0–34.0)
MCHC: 35.1 g/dL (ref 30.0–36.0)
MCV: 98.2 fL (ref 80.0–100.0)
Platelets: 254 10*3/uL (ref 150–400)
RBC: 4.44 MIL/uL (ref 4.22–5.81)
RDW: 12.2 % (ref 11.5–15.5)
WBC: 9.7 10*3/uL (ref 4.0–10.5)
nRBC: 0 % (ref 0.0–0.2)

## 2022-11-13 LAB — PROTIME-INR
INR: 1.1 (ref 0.8–1.2)
Prothrombin Time: 14.3 seconds (ref 11.4–15.2)

## 2022-11-13 LAB — CBG MONITORING, ED: Glucose-Capillary: 122 mg/dL — ABNORMAL HIGH (ref 70–99)

## 2022-11-13 LAB — ETHANOL: Alcohol, Ethyl (B): <10 mg/dL (ref <10)

## 2022-11-13 LAB — I-STAT CHEM 8, ED
BUN: 22 mg/dL (ref 8–23)
Calcium, Ion: 1.07 mmol/L — ABNORMAL LOW (ref 1.15–1.40)
Chloride: 100 mmol/L (ref 98–111)
Creatinine, Ser: 1 mg/dL (ref 0.61–1.24)
Glucose, Bld: 125 mg/dL — ABNORMAL HIGH (ref 70–99)
HCT: 45 % (ref 39.0–52.0)
Hemoglobin: 15.3 g/dL (ref 13.0–17.0)
Potassium: 4.3 mmol/L (ref 3.5–5.1)
Sodium: 132 mmol/L — ABNORMAL LOW (ref 135–145)
TCO2: 22 mmol/L (ref 22–32)

## 2022-11-13 MED ORDER — ACETAMINOPHEN 160 MG/5ML PO SOLN: 650.0000 mg | Freq: Four times a day (QID) | ORAL | Status: DC | PRN

## 2022-11-13 MED ORDER — ACETAMINOPHEN 325 MG PO TABS: 650.0000 mg | ORAL_TABLET | Freq: Four times a day (QID) | ORAL | Status: DC | PRN

## 2022-11-13 MED ORDER — STROKE: EARLY STAGES OF RECOVERY BOOK
Freq: Once | Status: AC
  Filled 2022-11-13: qty 1

## 2022-11-13 MED ORDER — IOHEXOL 350 MG/ML SOLN
75.0000 mL | Freq: Once | INTRAVENOUS | Status: AC | PRN
  Administered 2022-11-13: 75 mL via INTRAVENOUS

## 2022-11-13 MED ORDER — ACETAMINOPHEN 650 MG RE SUPP: 650.0000 mg | Freq: Four times a day (QID) | RECTAL | Status: DC | PRN

## 2022-11-13 NOTE — ED Notes (Signed)
Patient transported to MRI 

## 2022-11-13 NOTE — ED Triage Notes (Signed)
Pt with hx CVA reports sudden onset of L sided weakness one hour ago. Also reports L sided sensory deficit.

## 2022-11-13 NOTE — ED Provider Notes (Signed)
Center Provider Note  CSN: [id number] Arrival date & time: 11/13/22 1810  Chief Complaint(s) Weakness  HPI Damon Parrish is a 82 y.o. 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  Patient woke up at 7 AM this morning but when attempting to shave at 11 AM this morning he felt that his left upper extremity was not working well.  His wife states that they went to eat and he had trouble using his fork at lunchtime.  Patient arrives as a stroke alert due to LVO symptoms within the 24-hour window.  Patient arrives with left upper and left lower extremity weakness.   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   GERD (gastroesophageal reflux disease)    H/O cerebral aneurysm repair 2016   "put stent in"   High cholesterol    History of blood transfusion ~ 1950   "while in hospital w/pneumonia" (01/09/2018)   History of kidney stones    Hypertension    Nonruptured cerebral aneurysm    Pneumonia ~ 1950   Red man syndrome    Seizures ([place]) 12/27/2015; 12/28/2015   Stroke ([place]) 12/2015   denies residual on 01/09/2018)   Patient Active Problem List   Diagnosis Date Noted   Left leg numbness    Muscle fasciculation    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[place]) 01/07/2019   Allergic reaction    DRESS syndrome    Pressure injury of skin 01/09/2018   Adverse drug reaction 01/08/2018   Dermatitis, drug-induced 01/08/2018   Discitis 12/17/2017   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   Rectal bleeding    Constipation 12/16/2017   Cerebrovascular accident (CVA) due to embolism of right middle cerebral artery ([place]) 03/06/2016   S/P cerebral aneurysm repair 03/06/2016   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 Authorizing Provider  amLODipine (NORVASC) 5 MG tablet Take 1 tablet (5 mg total) by mouth daily. 12/18/17   Johnson, Clanford L, MD  Ascorbic Acid (VITAMIN C) 1000 MG tablet Take 1,000 mg by mouth daily.    [provider]  aspirin 325 MG tablet Take 325 mg by mouth daily.    [provider]  Brinzolamide-Brimonidine (SIMBRINZA) 1-0.2 % SUSP Place 1 drop into both eyes 2 (two) times daily.     [provider]  Cholecalciferol (VITAMIN D-1000 MAX ST) 1000 units tablet Take 1 tablet by mouth daily.    [provider]  fluticasone (FLONASE) 50 MCG/ACT nasal spray Place 1 spray into the nose daily.    [provider]  latanoprost (XALATAN) 0.005 % ophthalmic solution Place 1 drop into both eyes at bedtime.  12/26/18   [provider]  levETIRAcetam (KEPPRA) 500 MG tablet Take 1 tablet (500 mg total) by mouth 2 (two) times daily. 01/08/19   Barton Dubois, MD  loratadine (CLARITIN) 10 MG tablet Take 1 tablet (10 mg total) by mouth daily for 5 days. Patient not taking: Reported on 01/07/2019 01/18/18 01/23/18  Florencia Reasons, MD  Multiple Vitamin (MULTIVITAMIN) capsule Take 1 capsule by mouth daily.    [provider]  multivitamin-lutein (OCUVITE-LUTEIN) CAPS capsule Take 1 capsule by mouth daily. 12/18/17   Johnson, Clanford L, MD   vitamin E 400 UNIT capsule Take 400 Units by mouth daily.    [provider]                                                                                                                                    Past Surgical History Past Surgical History:  Procedure Laterality Date   CATARACT EXTRACTION W/ INTRAOCULAR LENS  IMPLANT, BILATERAL Bilateral    COLONOSCOPY     2013 per patient: done in [place], normal, next colonoscopy due in 10 years   FOOT FRACTURE SURGERY Left    "pins, etc. in there"   [employer] N/A 05/04/2015   Procedure: Pipeline Embolization;  Surgeon: Consuella Lose, MD;  Location: [facility];  Service: Radiology;  Laterality: N/A;   RADIOLOGY WITH ANESTHESIA N/A 05/31/2015   Procedure: Pipeline Embolization;  Surgeon: Consuella Lose, MD;  Location: [place];  Service: Radiology;  Laterality: N/A;   Family History Family History  Problem Relation Age of Onset   Lymphoma Mother    Dementia Mother    Aneurysm Father    Colon cancer Neg Hx     Social History Social History   Tobacco Use   Smoking status: Every Day    Packs/day: 0.50    Years: 61.00    Total pack years: 30.50    Types: Cigarettes   Smokeless tobacco: Never  Vaping Use   Vaping Use: Never used  Substance Use Topics   Alcohol use: No    Alcohol/week: 0.0 standard drinks of alcohol   Drug use: No   Allergies Cephalosporins and Vancomycin  Review of Systems Review of Systems  Neurological:  Positive for weakness.    Physical Exam Vital Signs  I have reviewed the triage vital signs BP (!) 153/83   Pulse 80   Temp (!) 97.4 F (36.3 C) (Oral)   Resp 16   SpO2 97%   Physical Exam Constitutional:      General: He is not in acute distress.    Appearance: Normal appearance.  HENT:     Head: Normocephalic and atraumatic.     Nose: No congestion or rhinorrhea.  Eyes:     General:        Right eye: No discharge.        Left eye: No  discharge.     Extraocular Movements: Extraocular movements intact.     Pupils: Pupils are equal, round, and reactive to light.  Cardiovascular:     Rate and Rhythm: Normal rate and regular rhythm.     Heart sounds: No murmur heard. Pulmonary:     Effort: No respiratory distress.     Breath sounds: No wheezing or rales.  Abdominal:     General: There is no distension.     Tenderness: There is no abdominal tenderness.  Musculoskeletal:        General: Normal range of motion.     Cervical back: Normal range of motion.  Skin:    General: Skin is warm and dry.  Neurological:     General: No focal deficit present.     Mental Status: He is alert.     Motor: Weakness present.     ED Results and Treatments Labs (all labs ordered are listed, but only abnormal results are displayed) Labs Reviewed  CBC - Abnormal; Notable for the following components:      Result Value   MCH 34.5 (*)    All other components within normal limits  COMPREHENSIVE METABOLIC PANEL - Abnormal; Notable for the following components:   Sodium 131 (*)    Glucose, Bld 130 (*)    All other components within normal limits  CBG MONITORING, ED - Abnormal; Notable for the following components:   Glucose-Capillary 122 (*)    All other components within normal limits  I-STAT CHEM 8, ED - Abnormal; Notable for the following components:   Sodium 132 (*)    Glucose, Bld 125 (*)    Calcium, Ion 1.07 (*)    All other components within normal limits  ETHANOL  PROTIME-INR  APTT  DIFFERENTIAL  RAPID URINE DRUG SCREEN, HOSP PERFORMED  URINALYSIS, ROUTINE W REFLEX MICROSCOPIC  LEVETIRACETAM LEVEL                                                                                                                          Radiology CT ANGIO HEAD NECK W WO CM (CODE STROKE)  Result Date: 11/13/2022 CLINICAL DATA:  Neuro deficit, acute, stroke suspected. Left arm numbness and weakness. EXAM: CT ANGIOGRAPHY HEAD AND NECK  TECHNIQUE: Multidetector CT imaging of the head and neck was performed using the standard protocol during bolus administration of intravenous contrast. Multiplanar CT image reconstructions and MIPs were obtained to evaluate the vascular anatomy. Carotid stenosis measurements (when applicable) are obtained utilizing NASCET criteria, using the distal internal carotid diameter as the denominator. RADIATION DOSE REDUCTION: This exam was performed according to the departmental dose-optimization program which includes automated exposure control, adjustment of the mA and/or kV according to patient size and/or use of iterative reconstruction technique. CONTRAST:  2m OMNIPAQUE IOHEXOL 350 MG/ML SOLN COMPARISON:  Head CT same day FINDINGS: CTA NECK FINDINGS Aortic arch: Aortic atherosclerosis. Branching pattern is normal without origin stenosis. Right carotid system: Common carotid artery widely patent  to the bifurcation. Mild calcified plaque at the carotid bifurcation but no stenosis. Cervical ICA widely patent. Left carotid system: Common carotid artery widely patent to the bifurcation. Carotid bifurcation is normal. Cervical ICA is normal. Vertebral arteries: Both vertebral artery origins are widely patent. Both vertebral arteries appear normal through the cervical region to the foramen magnum. Skeleton: Chronic cervical spondylosis.  No acute finding. Other neck: No mass or lymphadenopathy. Upper chest: Emphysema and pulmonary scarring. Old rib fractures on the right. 11 mm nodule in the medial right upper lobe. This is new since 2017 and is worrisome for a possible lung cancer. Review of the MIP images confirms the above findings CTA HEAD FINDINGS Anterior circulation: Both internal carotid arteries are patent through the skull base and siphon regions. Ordinary siphon atherosclerosis. Carotid artery stent in the distal siphon to supraclinoid ICA on the right. No evidence of residual aneurysmal flow. The stent is  patent. Flow is present in the anterior and middle cerebral arteries. No large vessel occlusion. Posterior circulation: Both vertebral arteries are patent through the foramen magnum to the basilar artery. No basilar stenosis. Superior cerebellar arteries and posterior cerebral arteries appear normal. Venous sinuses: Patent and normal. Anatomic variants: None significant. Review of the MIP images confirms the above findings IMPRESSION: 1. No acute large vessel occlusion. 2. Aortic atherosclerosis. 3. Mild atherosclerotic change at the right carotid bifurcation but no stenosis. 4. Right ICA stent in the distal siphon to supraclinoid ICA. No evidence of residual aneurysmal flow. 5. 11 mm nodule in the medial right upper lobe. This is new since 2017 and is worrisome for a possible lung cancer. Consider one of the following in 3 months for both low-risk and high-risk individuals: (a) repeat chest CT, (b) follow-up PET-CT, or (c) tissue sampling. This recommendation follows the consensus statement: Guidelines for Management of Incidental Pulmonary Nodules Detected on CT Images: From the Fleischner Society 2017; Radiology 2017; 284:228-243. Because of the presence of emphysema and pulmonary scarring, this patient is certainly high risk. Because this was not present in 2017, I think proceeding to PET scan or tissue sampling could be preferable. Aortic Atherosclerosis (ICD10-I70.0). Electronically Signed   By: Nelson Chimes M.D.   On: 11/13/2022 18:59   CT HEAD CODE STROKE WO CONTRAST  Result Date: 11/13/2022 CLINICAL DATA:  Code stroke.  Neuro deficit, acute, stroke suspected EXAM: CT HEAD WITHOUT CONTRAST TECHNIQUE: Contiguous axial images were obtained from the base of the skull through the vertex without intravenous contrast. RADIATION DOSE REDUCTION: This exam was performed according to the departmental dose-optimization program which includes automated exposure control, adjustment of the mA and/or kV according to  patient size and/or use of iterative reconstruction technique. COMPARISON:  CT head January 07, 2019. FINDINGS: Brain: Mild increase in thickness of a right subdural low-attenuation collection, measuring approximately 1.1 cm in thickness. Suspected trace left subdural low-attenuation fluid collection is unchanged. No evidence of acute large vascular territory infarct, or definitely acute hemorrhage. Mild associated mass effect without midline shift. Vascular: No hyperdense vessel identified. Right ICA stent. Calcific atherosclerosis. Skull: No acute fracture. Sinuses/Orbits: Fluid in left maxillary sinus. No acute orbital findings. Other: No mastoid effusions. ASPECTS [facility] Stroke Program Early CT Score) total score (0-10 with 10 being normal): 10. IMPRESSION: 1. Mild increase in thickness of a right subdural low-attenuation collection, measuring approximately 1.1 cm in thickness. Suspected trace left subdural low-attenuation fluid collection is unchanged. 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 Mild associated mass effect  without midline shift. 2. Otherwise, no evidence of acute intracranial abnormality. Code stroke imaging results were communicated on 11/13/2022 at 6:38 pm to provider Baghat via telephone, who verbally acknowledged these results. Electronically Signed   By: Margaretha Sheffield M.D.   On: 11/13/2022 18:40    Pertinent labs & imaging results that were available during my care of the patient were reviewed by me and considered in my medical decision making (see MDM for details).  Medications Ordered in ED Medications  iohexol (OMNIPAQUE) 350 MG/ML injection 75 mL (75 mLs Intravenous Contrast Given 11/13/22 1847)                                                                                                                                     Procedures .Critical Care  Performed by: Teressa Lower, MD Authorized by: Teressa Lower, MD   Critical care  provider statement:    Critical care time (minutes):  30   Critical care was necessary to treat or prevent imminent or life-threatening deterioration of the following conditions:  CNS failure or compromise   Critical care was time spent personally by me on the following activities:  Development of treatment plan with patient or surrogate, discussions with consultants, evaluation of patient's response to treatment, examination of patient, ordering and review of laboratory studies, ordering and review of radiographic studies, ordering and performing treatments and interventions, pulse oximetry, re-evaluation of patient's condition and review of old charts   (including critical care time)  Medical Decision Making / ED Course   This patient presents to the ED for concern of weakness, this involves an extensive number of treatment options, and is a complaint that carries with it a high risk of complications and morbidity.  The differential diagnosis includes CVA, mass, intracranial hemorrhage, spinal stenosis, focal seizure  MDM: Patient seen the emergency room for evaluation of left-sided weakness.  Physical exam with left upper and left lower extremity weakness.  patient arrives as a stroke alert and I saw the patient in direct consultation with the stroke neurologist Dr. Curly Shores.  We both considered TNK but patient is outside the window.  Initial CT with mild increase of the right subdural collection with mild associated mass effect but no midline shift.  Follow-up angio with no LVO, new 11 mm lung nodule that will ultimately require follow-up.  Laboratory evaluation largely unremarkable.  Neurology recommending hospital admission for MRI and EEG.  Patient then admitted   Additional history obtained: -Additional history obtained from multiple family members -External records from outside source obtained and reviewed including: Chart review including previous notes, labs, imaging, consultation  notes   Lab Tests: -I ordered, reviewed, and interpreted labs.   The pertinent results include:   Labs Reviewed  CBC - Abnormal; Notable for the following components:      Result Value   MCH 34.5 (*)    All other components within normal limits  COMPREHENSIVE METABOLIC PANEL - Abnormal; Notable for the following components:   Sodium 131 (*)    Glucose, Bld 130 (*)    All other components within normal limits  CBG MONITORING, ED - Abnormal; Notable for the following components:   Glucose-Capillary 122 (*)    All other components within normal limits  I-STAT CHEM 8, ED - Abnormal; Notable for the following components:   Sodium 132 (*)    Glucose, Bld 125 (*)    Calcium, Ion 1.07 (*)    All other components within normal limits  ETHANOL  PROTIME-INR  APTT  DIFFERENTIAL  RAPID URINE DRUG SCREEN, HOSP PERFORMED  URINALYSIS, ROUTINE W REFLEX MICROSCOPIC  LEVETIRACETAM LEVEL      EKG   EKG Interpretation  Date/Time:  Wednesday November 13 2022 18:19:21 EST Ventricular Rate:  79 PR Interval:  182 QRS Duration: 96 QT Interval:  368 QTC Calculation: 421 R Axis:   -30 Text Interpretation: Normal sinus rhythm Left axis deviation Abnormal ECG When compared with ECG of 07-Jan-2019 16:23, PREVIOUS ECG IS PRESENT Confirmed by [place] ([id number]) on 11/13/2022 8:24:55 PM         Imaging Studies ordered: I ordered imaging studies including CT head, CT angio brain and neck I independently visualized and interpreted imaging. I agree with the radiologist interpretation   Medicines ordered and prescription drug management: Meds ordered this encounter  Medications   iohexol (OMNIPAQUE) 350 MG/ML injection 75 mL    -I have reviewed the patients home medicines and have made adjustments as needed  Critical interventions Stroke activation and consideration of TNK  Consultations Obtained: I requested consultation with the stroke neurologist Dr. Curly Shores,  and discussed lab and  imaging findings as well as pertinent plan - they recommend: Medical admission for MRI and possible EEG   Cardiac Monitoring: The patient was maintained on a cardiac monitor.  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   GERD (gastroesophageal reflux disease)    H/O cerebral aneurysm repair 2016   "put stent in"   High cholesterol    History of blood transfusion ~ 1950   "while in hospital w/pneumonia" (01/09/2018)   History of kidney stones    Hypertension    Nonruptured cerebral aneurysm    Pneumonia ~ 1950   Red man syndrome    Seizures ([place]) 12/27/2015; 12/28/2015   Stroke ([place]) 12/2015   denies residual on 01/09/2018)      Dispostion: I considered admission for this patient, and due to persistent neurodeficits and concern for underlying CVA, patient will require hospital admission     Final Clinical Impression(s) / ED Diagnoses Final diagnoses:  None     @PCDICTATION$ @    Teressa Lower, MD 11/13/22 2025

## 2022-11-13 NOTE — ED Notes (Signed)
Back from MRI.

## 2022-11-13 NOTE — ED Provider Triage Note (Signed)
Emergency Medicine Provider Triage Evaluation Note  Damon Parrish , a 82 y.o. male  was evaluated in triage.  Pt complains of left arm numbness and weakness, left leg numbness and weakness.  Patient reports symptoms began 1 hour ago for dinner.  Patient wife at bedside reports that the patient was unable to grip or pick up fork secondary to weakness of left upper extremity.  Patient has remote history of CVA in 2017, no longer takes blood thinners, wife at bedside reports no real deficits from previous CVA.  Patient states that he does have subjective weakness in his left upper extremity, left lower extremity.  The patient wife at the bedside also reports the patient seems as if he is slurring his words, has a slight facial droop.    Patient has 5 out of 5 strength to upper and lower extremities bilaterally my examination.  Patient does have left-sided pronator drift on examination.  Patient case discussed with Dr. Sherry Ruffing who agrees with calling code stroke.  Code stroke has been activated at this time.  Review of Systems  Positive:  Negative:   Physical Exam  There were no vitals taken for this visit. Gen:   Awake, no distress   Resp:  Normal effort  MSK:   Moves extremities without difficulty  Other:    Medical Decision Making  Medically screening exam initiated at 6:19 PM.  Appropriate orders placed.  Antionette Fairy was informed that the remainder of the evaluation will be completed by another provider, this initial triage assessment does not replace that evaluation, and the importance of remaining in the ED until their evaluation is complete.     Azucena Cecil, PA-C 11/13/22 1821

## 2022-11-13 NOTE — Consult Note (Signed)
Neurology Consultation  Reason for Consult: Code Stroke Referring Physician: Kommor  CC: Left sided weakness   History is obtained from: Patient  HPI: Damon Parrish is a 82 y.o. 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 He woke up at 0700 in his normal state of health. He went back to sleep and then got back up at 1100 and he noticed this morning that he was trying to shave this morning and his hand was not working and his left leg was weak / shaky. They then went to lunch he couldn't hold his fork, had difficulty holding his cane, and with ambulating. Endorses smoking 1PPD. He is currently on aspirin.  No shaking activity today, and he reports gradual worsening of his weakness since he first noticed it.  Hx of stroke In April 2017 he was seen at [facility] for a stroke. MRI showed patchy acute ischemia 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  He has been on aspirin 325 mg.  He did have left-sided weakness after this event however he reportedly recovered well and had equal strength in all extremities at his last neurology appointment in 2018.  Hx of seizures He developed seizures 12/28/2015, left arm greater than leg rhythmic shaking as well as neck turning and shaking, first episode lasting about 1 hour controlled after a Keppra load. In 2018 he was titrated off of his Keppra due to fatigue and he had a normal EEG.  In 2020 he was seen for abnormal left lower extremity movements that were thought to be focal seizures and his Keppra was resumed at 500 mg twice daily.  He states he has been consistent with this dosing.  No recent focal seizures but wife notes that he had an unwitnessed fall in  the bathroom on January 11.  He was not confused afterwards and did not have any focal weakness afterwards but did have some bruising and tenderness to the ribs.  Unclear why he fell  LKW: 0700 tpa given?: No as he is out of the window, no thrombectomy given no LVO Premorbid modified Rankin scale (mRS):  2-Slight disability-UNABLE to perform all activities but does not need assistance    ROS: Full ROS was performed and is negative except as noted in the HPI.   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   GERD (gastroesophageal reflux disease)    H/O cerebral aneurysm repair 2016   "put stent in"   High cholesterol    History of blood transfusion ~ 1950   "while in hospital w/pneumonia" (01/09/2018)   History of kidney stones    Hypertension    Nonruptured cerebral aneurysm    Pneumonia ~ 1950   Red man syndrome    Seizures ([place]) 12/27/2015; 12/28/2015   Stroke ([place]) 12/2015   denies residual on 01/09/2018)   Past Surgical History:  Procedure Laterality Date   CATARACT EXTRACTION W/ INTRAOCULAR LENS  IMPLANT, BILATERAL Bilateral    COLONOSCOPY     2013 per patient: done in [place], normal, next colonoscopy due in 10 years   FOOT  FRACTURE SURGERY Left    "pins, etc. in there"   [place] WITH ANESTHESIA N/A 05/04/2015   Procedure: Pipeline Embolization;  Surgeon: Consuella Lose, MD;  Location: [facility];  Service: Radiology;  Laterality: N/A;   RADIOLOGY WITH ANESTHESIA N/A 05/31/2015   Procedure: Pipeline Embolization;  Surgeon: Consuella Lose, MD;  Location: [facility];  Service: Radiology;  Laterality: N/A;   Current Outpatient Medications  Medication Instructions   amLODipine (NORVASC) 5 mg, Oral, Daily   aspirin 325 mg, Oral, Daily   Brinzolamide-Brimonidine (SIMBRINZA) 1-0.2 % SUSP 1 drop,  Both Eyes, 2 times daily   Cholecalciferol (VITAMIN D-1000 MAX ST) 1000 units tablet 1 tablet, Oral, Daily   fluticasone (FLONASE) 50 MCG/ACT nasal spray 1 spray, Nasal, Daily   latanoprost (XALATAN) 0.005 % ophthalmic solution 1 drop, Both Eyes, Daily at bedtime   levETIRAcetam (KEPPRA) 500 mg, Oral, 2 times daily   loratadine (CLARITIN) 10 mg, Oral, Daily   Multiple Vitamin (MULTIVITAMIN) capsule 1 capsule, Oral, Daily   multivitamin-lutein (OCUVITE-LUTEIN) CAPS capsule 1 capsule, Oral, Daily   vitamin C 1,000 mg, Oral, Daily   vitamin E 400 Units, Oral, Daily       Family History  Problem Relation Age of Onset   Lymphoma Mother    Dementia Mother    Aneurysm Father    Colon cancer Neg Hx      Social History:   reports that he has been smoking cigarettes. He has a 30.50 pack-year smoking history. He has never used smokeless tobacco. He reports that he does not drink alcohol and does not use drugs.  Medications No current facility-administered medications for this encounter.  Current Outpatient Medications:    amLODipine (NORVASC) 5 MG tablet, Take 1 tablet (5 mg total) by mouth daily., Disp: , Rfl:    Ascorbic Acid (VITAMIN C) 1000 MG tablet, Take 1,000 mg by mouth daily., Disp: , Rfl:    aspirin 325 MG tablet, Take 325 mg by mouth daily., Disp: , Rfl:    Brinzolamide-Brimonidine (SIMBRINZA) 1-0.2 % SUSP, Place 1 drop into both eyes 2 (two) times daily. , Disp: , Rfl:    Cholecalciferol (VITAMIN D-1000 MAX ST) 1000 units tablet, Take 1 tablet by mouth daily., Disp: , Rfl:    fluticasone (FLONASE) 50 MCG/ACT nasal spray, Place 1 spray into the nose daily., Disp: , Rfl:    latanoprost (XALATAN) 0.005 % ophthalmic solution, Place 1 drop into both eyes at bedtime. , Disp: , Rfl:    levETIRAcetam (KEPPRA) 500 MG tablet, Take 1 tablet (500 mg total) by mouth 2 (two) times daily., Disp: 60 tablet, Rfl: 1   loratadine (CLARITIN) 10 MG tablet, Take 1 tablet (10 mg total) by mouth  daily for 5 days. (Patient not taking: Reported on 01/07/2019), Disp: 5 tablet, Rfl: 0   Multiple Vitamin (MULTIVITAMIN) capsule, Take 1 capsule by mouth daily., Disp: , Rfl:    multivitamin-lutein (OCUVITE-LUTEIN) CAPS capsule, Take 1 capsule by mouth daily., Disp: , Rfl: 0   vitamin E 400 UNIT capsule, Take 400 Units by mouth daily., Disp: , Rfl:    Exam: Current vital signs: BP (!) 159/73 (BP Location: Right Arm)   Pulse 80   Temp (!) 97.4 F (36.3 C) (Oral)   Resp 18   SpO2 97%  Vital signs in last 24 hours: Temp:  [97.4 F (36.3 C)] 97.4 F (36.3 C) (02/21 1821) Pulse Rate:  [80] 80 (02/21 1821) Resp:  [18] 18 (  02/21 1821) BP: (159)/(73) 159/73 (02/21 1821) SpO2:  [97 %] 97 % (02/21 1821)  GENERAL: Awake, alert in NAD HEENT: - Normocephalic and atraumatic, dry mm, no LN++, no Thyromegally LUNGS - Clear to auscultation bilaterally with no wheezes CV - S1S2 RRR, no m/r/g, equal pulses bilaterally. ABDOMEN - Soft, nontender, nondistended with normoactive BS Ext: warm, well perfused, intact peripheral pulses, no edema  NEURO:  Mental Status: AA&Ox3  Language: speech is clear.  Naming, repetition, fluency, and comprehension intact. Cranial Nerves: PERRL. EOMI, visual fields full, no facial asymmetry, facial sensation intact, hearing intact, tongue/uvula/soft palate midline, normal sternocleidomastoid and trapezius muscle strength. No evidence of tongue atrophy or fasciculations Motor:  Left arm with pronator drift     Right  Left Shoulder Abduction  5/5  4/5 Elbow Flexion   5/5  4/5 Elbow Extension  5/5  4/5 Wrist Extension  5/5  5/5 Hip Flexion   5/5  5/5 Ankle Dorsiflexion  5/5  5/5 Ankle Plantarflexion  5/5  5/5     Tone: is normal and bulk is normal Sensation- Intact to light touch bilaterally Coordination: FTN inaccurate with past pointing. HKS left with ataxia Gait- deferred    NIHSS components Score: Comment  1a Level of Conscious 0[x]$  1[]$  2[]$  3[]$         1b  LOC Questions 0[x]$  1[]$  2[]$           1c LOC Commands 0[x]$  1[]$  2[]$           2 Best Gaze 0[x]$  1[]$  2[]$           3 Visual 0[x]$  1[]$  2[]$  3[]$         4 Facial Palsy 0[x]$  1[]$  2[]$  3[]$         5a Motor Arm - left 0[]$  1[x]$  2[]$  3[]$  4[]$  UN[]$     5b Motor Arm - Right 0[x]$  1[]$  2[]$  3[]$  4[]$  UN[]$     6a Motor Leg - Left 0[]$  1[]$  2[]$  3[]$  4[]$  UN[]$     6b Motor Leg - Right 0[]$  1[]$  2[]$  3[]$  4[]$  UN[]$     7 Limb Ataxia 0[x]$  1[]$  2[]$  3[]$  UN[]$       8 Sensory 0[x]$  1[]$  2[]$  UN[]$         9 Best Language 0[x]$  1[]$  2[]$  3[]$         10 Dysarthria 0[x]$  1[]$  2[]$  UN[]$         11 Extinct. and Inattention 0[x]$  1[]$  2[]$           TOTAL:         Labs I have reviewed labs in epic and the results pertinent to this consultation are:   Basic Metabolic Panel: Recent Labs  Lab 11/13/22 1820 11/13/22 1829  NA 131* 132*  K 4.3 4.3  CL 98 100  CO2 22  --   GLUCOSE 130* 125*  BUN 18 22  CREATININE 1.01 1.00  CALCIUM 9.5  --     CBC: Recent Labs  Lab 11/13/22 1820 11/13/22 1829  WBC 9.7  --   NEUTROABS 5.6  --   HGB 15.3 15.3  HCT 43.6 45.0  MCV 98.2  --   PLT 254  --     Coagulation Studies: Recent Labs    11/13/22 1820  LABPROT 14.3  INR 1.1      Imaging I have reviewed the images obtained:  CT-head Mild increase in thickness of a right subdural low-attenuation collection, measuring approximately 1.1 cm in thickness. Suspected trace left subdural low-attenuation fluid collection is unchanged. These most  likely represent chronic subdural hematomas or chronic hematomas. Mild associated mass effect without midline shift.  CTA Head and Neck  No LVO; Right ICA stent in the distal siphon to supraclinoid ICA. No evidence of residual aneurysmal flow Vessels are seen transversing the fluid collections, which is atypical for hygroma  MRI examination of the brain - pending   Assessment:  82 y.o. male presenting with left side weakness, gradually progressive since symptom discovery at 11 AM, out of the treatment window and  exam and imaging not consistent with LVO.  Examination is notable for left facial droop, left arm weakness, and left leg ataxia new from baseline, without sensory changes.  This could be consistent with a small lacunar stroke, less likely focal seizure without rhythmic shaking activity, possibly symptomatic from worsening fluid collection in the right subdural space (although this appears chronic and he has had gradually progressive symptoms today)  Impression: Acute ischemic infarct vs focal breakthrough seizure versus symptomatic hygroma  Recommendations: Please admit  MRI brain without contrast  If positive for stroke admit for stroke workup (A1c, lipid panel, echocardiogram, telemetry, PT/OT/SLP evaluation) Keppra level If MRI is negative, routine EEG Neurology will follow along   Patient seen and examined by NP/APP with MD. MD to update note as needed.   Janine Ores, DNP, FNP-BC [facility] Pager: [phone]  Attending Neurologist's note:  I personally saw this patient, gathering history, performing a full neurologic examination, reviewing relevant labs, personally reviewing relevant imaging including head CT, CTA, and formulated the assessment and plan, adding the note above for completeness and clarity to accurately reflect my thoughts   CRITICAL CARE Performed by: Lorenza Chick   Total critical care time: 40 minutes  Critical care time was exclusive of separately billable procedures and treating other patients.  Critical care was necessary to treat or prevent imminent or life-threatening deterioration, urgent evaluation for consideration of thrombectomy or thrombolytic treatment.  Critical care was time spent personally by me on the following activities: development of treatment plan with patient and/or surrogate as well as nursing, discussions with consultants, evaluation of patient's response to treatment, examination of patient, obtaining history from  patient or surrogate, ordering and performing treatments and interventions, ordering and review of laboratory studies, ordering and review of radiographic studies, pulse oximetry and re-evaluation of patient's condition.

## 2022-11-13 NOTE — ED Notes (Signed)
Receiving RN accepts transport. Pt transported on cardiac monitoring.

## 2022-11-13 NOTE — Code Documentation (Signed)
[name] is an 82 yr old male arriving to [facility] on 11/13/2022 with a PMH of cerebral aneurysm, seizures, prior CVA and subdural hygroma. He is not on any anticoagulants. Pt is from home where he was last known well this morning and is now complaining of left arm weakness and ataxia.    Code stroke activated in ED. Pt met with stroke team in [place]. BP, CBG WNL. Initially thought that last known well was 1715, but on further discussion, the symptoms began this morning. NIHSS 2. Pt with left arm drift and ataxia in one limb (left leg). The following imaging was performed: CT, CTA. Per Dr Curly [place], CT is neg for acute hemorrhage. CTA is neg for LVO.    Pt back to ED room 29 where his workup will continue. He will need q 2 hr VS and NIHSS for 12 hrs then q 4. Stroke swallow screen before any PO intake. Pt is not a candidate for TNK due to OOW, history intracranial aneurysm. Pt is ineligible for thrombectomy due to LVO negative. Bedside handoff with ED RN.

## 2022-11-13 NOTE — H&P (Signed)
History and Physical    Damon Parrish [id number] DOB: [birth year] DOA: 11/13/2022  PCP: Tobe Sos, MD  Patient coming from: Home  Chief Complaint: Left-sided weakness  HPI: Damon Parrish is a 82 y.o. male with medical history significant of right MCA stroke, epidural abscess, history of cerebral aneurysm repair in 2016, chronic subdural fluid collections (possible hygroma versus chronic subdurals), hypertension, hyperlipidemia, focal seizure, glaucoma, chronic lower back pain presented to the ED with sudden onset left upper and lower extremity weakness, left-sided facial droop.  Code stroke activated.  Out of tPA window at the time of arrival.  CT head without contrast showing: "IMPRESSION: 1. Mild increase in thickness of a right subdural low-attenuation collection, measuring approximately 1.1 cm in thickness. Suspected trace left subdural low-attenuation fluid collection is unchanged. 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 Mild associated mass effect without midline shift. 2. Otherwise, no evidence of acute intracranial abnormality."  "CTA head and neck showing: IMPRESSION: 1. No acute large vessel occlusion. 2. Aortic atherosclerosis. 3. Mild atherosclerotic change at the right carotid bifurcation but no stenosis. 4. Right ICA stent in the distal siphon to supraclinoid ICA. No evidence of residual aneurysmal flow. 5. 11 mm nodule in the medial right upper lobe. This is new since 2017 and is worrisome for a possible lung cancer. Consider one of the following in 3 months for both low-risk and high-risk individuals: (a) repeat chest CT, (b) follow-up PET-CT, or (c) tissue sampling. This recommendation follows the consensus statement: Guidelines for Management of Incidental Pulmonary Nodules Detected on CT Images: From the Fleischner Society 2017; Radiology 2017; 284:228-243. Because of the presence of emphysema and pulmonary  scarring, this patient is certainly high risk. Because this was not present in 2017, I think proceeding to PET scan or tissue sampling could be preferable.   Aortic Atherosclerosis (ICD10-I70.0)."  Neurology felt his symptoms could be due to acute ischemic infarct versus focal breakthrough seizure versus symptomatic hygroma.  Recommended further evaluation with brain MRI without contrast and checking Keppra level.  If MRI positive for stroke, then pursue for further stroke workup (A1c, lipid panel, echo, telemetry, PT/OT/SLP eval).  If MRI is negative, then obtain routine EEG.  MRI brain without contrast showing small area of acute or early subacute ischemia within the right corona radiata.  Chronic right subdural hematoma.  Blood pressure elevated with systolic in the [id number], remainder of vital signs stable.  No significant lab abnormalities.  Sodium 131 (chronically low and at baseline).  Blood ethanol level undetectable, UDS negative, UA not suggestive of infection.  Patient states he started feeling weak on his left side sometime this morning but does not remember exactly when.  Wife states around 5:30 PM as the patient was getting out of the car she noticed that he was having difficulty moving his left arm and left leg.  No difficulty speaking.  Afterwards when they were having dinner, he could not use his left hand to hold a fork.  He is left-handed.  She reports patient having history of prior stroke in 2019.  She is also concerned that the patient has had increasing cough for the past few weeks.  Patient reports smoking 1 pack of cigarettes daily for the past 60 years.  Denies fevers, shortness of breath, chest pain, nausea, vomiting, or abdominal pain.  Wife reports patient having diarrhea a few days ago but has been taking a stool softener at home.  Review of Systems:  Review of  Systems  All other systems reviewed and are negative.   Past Medical History:  Diagnosis Date   Arthritis     "generalized" (01/09/2018)   Chronic lower back pain    "last couple months" (01/09/2018)   Chronic sinus complaints    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) 12/27/2015; 12/28/2015   Stroke ([place]) 12/2015   denies residual on 01/09/2018)    Past Surgical History:  Procedure Laterality Date   CATARACT EXTRACTION W/ INTRAOCULAR LENS  IMPLANT, BILATERAL Bilateral    COLONOSCOPY     2013 per patient: done in [place], normal, next colonoscopy due in 10 years   FOOT FRACTURE SURGERY Left    "pins, etc. in there"   [place] N/A 05/04/2015   Procedure: Pipeline Embolization;  Surgeon: Consuella Lose, MD;  Location: [facility];  Service: Radiology;  Laterality: N/A;   RADIOLOGY WITH ANESTHESIA N/A 05/31/2015   Procedure: Pipeline Embolization;  Surgeon: Consuella Lose, MD;  Location: [facility];  Service: Radiology;  Laterality: N/A;     reports that he has been smoking cigarettes. He has a 30.50 pack-year smoking history. He has never used smokeless tobacco. He reports that he does not drink alcohol and does not use drugs.  Allergies  Allergen Reactions   Cephalosporins Rash    Full body rash with systemic symptoms   Vancomycin Rash    Full body rash with systemic symptoms    Family History  Problem Relation Age of Onset   Lymphoma Mother    Dementia Mother    Aneurysm Father    Colon cancer Neg Hx     Prior to Admission medications   Medication Sig Start Date End Date Taking? Authorizing Provider  amLODipine (NORVASC) 5 MG tablet Take 1 tablet (5 mg total) by mouth daily. 12/18/17   Johnson, Clanford L, MD  Ascorbic Acid  (VITAMIN C) 1000 MG tablet Take 1,000 mg by mouth daily.    [provider]  aspirin 325 MG tablet Take 325 mg by mouth daily.    [provider]  Brinzolamide-Brimonidine (SIMBRINZA) 1-0.2 % SUSP Place 1 drop into both eyes 2 (two) times daily.     [provider]  Cholecalciferol (VITAMIN D-1000 MAX ST) 1000 units tablet Take 1 tablet by mouth daily.    [provider]  fluticasone (FLONASE) 50 MCG/ACT nasal spray Place 1 spray into the nose daily.    [provider]  latanoprost (XALATAN) 0.005 % ophthalmic solution Place 1 drop into both eyes at bedtime.  12/26/18   [provider]  levETIRAcetam (KEPPRA) 500 MG tablet Take 1 tablet (500 mg total) by mouth 2 (two) times daily. 01/08/19   Barton Dubois, MD  loratadine (CLARITIN) 10 MG tablet Take 1 tablet (10 mg total) by mouth daily for 5 days. Patient not taking: Reported on 01/07/2019 01/18/18 01/23/18  Florencia Reasons, MD  Multiple Vitamin (MULTIVITAMIN) capsule Take 1 capsule by mouth daily.    [provider]  multivitamin-lutein (OCUVITE-LUTEIN) CAPS capsule Take 1 capsule by mouth daily. 12/18/17  Johnson, Clanford L, MD  vitamin E 400 UNIT capsule Take 400 Units by mouth daily.    [provider]    Physical Exam: Vitals:   11/13/22 1821 11/13/22 1855  BP: (!) 159/73 (!) 153/83  Pulse: 80 80  Resp: 18 16  Temp: (!) 97.4 F (36.3 C)   TempSrc: Oral   SpO2: 97% 97%    Physical Exam Vitals reviewed.  Constitutional:      General: He is not in acute distress. HENT:     Head: Normocephalic and atraumatic.  Eyes:     Extraocular Movements: Extraocular movements intact.  Cardiovascular:     Rate and Rhythm: Normal rate and regular rhythm.     Pulses: Normal pulses.  Pulmonary:     Effort: Pulmonary effort is normal. No respiratory distress.     Breath sounds: Normal breath sounds. No wheezing or rales.  Abdominal:     General: Bowel sounds are normal. There is no  distension.     Palpations: Abdomen is soft.     Tenderness: There is no abdominal tenderness.  Musculoskeletal:     Cervical back: Normal range of motion.     Right lower leg: No edema.     Left lower leg: No edema.  Skin:    General: Skin is warm and dry.  Neurological:     Mental Status: He is alert and oriented to person, place, and time.     Cranial Nerves: No cranial nerve deficit.     Comments: Strength 4 out of 5 in the left upper extremity.  Strength 5 out of 5 in the right upper extremity and bilateral lower extremities.  Sensation to light touch intact throughout.     Labs on Admission: I have personally reviewed following labs and imaging studies  CBC: Recent Labs  Lab 11/13/22 1820 11/13/22 1829  WBC 9.7  --   NEUTROABS 5.6  --   HGB 15.3 15.3  HCT 43.6 45.0  MCV 98.2  --   PLT 254  --    Basic Metabolic Panel: Recent Labs  Lab 11/13/22 1820 11/13/22 1829  NA 131* 132*  K 4.3 4.3  CL 98 100  CO2 22  --   GLUCOSE 130* 125*  BUN 18 22  CREATININE 1.01 1.00  CALCIUM 9.5  --    GFR: CrCl cannot be calculated (Unknown ideal weight.). Liver Function Tests: Recent Labs  Lab 11/13/22 1820  AST 33  ALT 27  ALKPHOS 106  BILITOT 1.0  PROT 6.7  ALBUMIN 4.0   No results for input(s): "LIPASE", "AMYLASE" in the last 168 hours. No results for input(s): "AMMONIA" in the last 168 hours. Coagulation Profile: Recent Labs  Lab 11/13/22 1820  INR 1.1   Cardiac Enzymes: No results for input(s): "CKTOTAL", "CKMB", "CKMBINDEX", "TROPONINI" in the last 168 hours. BNP (last 3 results) No results for input(s): "PROBNP" in the last 8760 hours. HbA1C: No results for input(s): "HGBA1C" in the last 72 hours. CBG: Recent Labs  Lab 11/13/22 1817  GLUCAP 122*   Lipid Profile: No results for input(s): "CHOL", "HDL", "LDLCALC", "TRIG", "CHOLHDL", "LDLDIRECT" in the last 72 hours. Thyroid Function Tests: No results for input(s): "TSH", "T4TOTAL", "FREET4",  "T3FREE", "THYROIDAB" in the last 72 hours. Anemia Panel: No results for input(s): "VITAMINB12", "FOLATE", "FERRITIN", "TIBC", "IRON", "RETICCTPCT" in the last 72 hours. Urine analysis:    Component Value Date/Time   COLORURINE STRAW (A) 11/13/2022 1937   APPEARANCEUR CLEAR 11/13/2022 1937  LABSPEC 1.029 11/13/2022 1937   PHURINE 7.0 11/13/2022 1937   GLUCOSEU NEGATIVE 11/13/2022 1937   HGBUR NEGATIVE 11/13/2022 1937   BILIRUBINUR NEGATIVE 11/13/2022 1937   KETONESUR NEGATIVE 11/13/2022 1937   PROTEINUR NEGATIVE 11/13/2022 1937   UROBILINOGEN 0.2 05/04/2015 1043   NITRITE NEGATIVE 11/13/2022 1937   LEUKOCYTESUR NEGATIVE 11/13/2022 1937    Radiological Exams on Admission: MR BRAIN WO CONTRAST  Result Date: 11/13/2022 CLINICAL DATA:  Acute neurologic deficit EXAM: MRI HEAD WITHOUT CONTRAST TECHNIQUE: Multiplanar, multiecho pulse sequences of the brain and surrounding structures were obtained without intravenous contrast. COMPARISON:  12/30/2015 FINDINGS: Brain: Small area of abnormal diffusion restriction within the right corona radiata. Chronic right subdural hematoma. No acute hemorrhage. There is multifocal hyperintense T2-weighted signal within the white matter. Generalized volume loss. The midline structures are normal. Vascular: Major flow voids are preserved. Skull and upper cervical spine: Normal calvarium and skull base. Visualized upper cervical spine and soft tissues are normal. Sinuses/Orbits:No paranasal sinus fluid levels or advanced mucosal thickening. No mastoid or middle ear effusion. Normal orbits. IMPRESSION: 1. Small area of acute or early subacute ischemia within the right corona radiata. 2. Chronic right subdural hematoma. Electronically Signed   By: Ulyses Jarred M.D.   On: 11/13/2022 20:48   CT ANGIO HEAD NECK W WO CM (CODE STROKE)  Result Date: 11/13/2022 CLINICAL DATA:  Neuro deficit, acute, stroke suspected. Left arm numbness and weakness. EXAM: CT ANGIOGRAPHY  HEAD AND NECK TECHNIQUE: Multidetector CT imaging of the head and neck was performed using the standard protocol during bolus administration of intravenous contrast. Multiplanar CT image reconstructions and MIPs were obtained to evaluate the vascular anatomy. Carotid stenosis measurements (when applicable) are obtained utilizing NASCET criteria, using the distal internal carotid diameter as the denominator. RADIATION DOSE REDUCTION: This exam was performed according to the departmental dose-optimization program which includes automated exposure control, adjustment of the mA and/or kV according to patient size and/or use of iterative reconstruction technique. CONTRAST:  4m OMNIPAQUE IOHEXOL 350 MG/ML SOLN COMPARISON:  Head CT same day FINDINGS: CTA NECK FINDINGS Aortic arch: Aortic atherosclerosis. Branching pattern is normal without origin stenosis. Right carotid system: Common carotid artery widely patent to the bifurcation. Mild calcified plaque at the carotid bifurcation but no stenosis. Cervical ICA widely patent. Left carotid system: Common carotid artery widely patent to the bifurcation. Carotid bifurcation is normal. Cervical ICA is normal. Vertebral arteries: Both vertebral artery origins are widely patent. Both vertebral arteries appear normal through the cervical region to the foramen magnum. Skeleton: Chronic cervical spondylosis.  No acute finding. Other neck: No mass or lymphadenopathy. Upper chest: Emphysema and pulmonary scarring. Old rib fractures on the right. 11 mm nodule in the medial right upper lobe. This is new since 2017 and is worrisome for a possible lung cancer. Review of the MIP images confirms the above findings CTA HEAD FINDINGS Anterior circulation: Both internal carotid arteries are patent through the skull base and siphon regions. Ordinary siphon atherosclerosis. Carotid artery stent in the distal siphon to supraclinoid ICA on the right. No evidence of residual aneurysmal flow. The  stent is patent. Flow is present in the anterior and middle cerebral arteries. No large vessel occlusion. Posterior circulation: Both vertebral arteries are patent through the foramen magnum to the basilar artery. No basilar stenosis. Superior cerebellar arteries and posterior cerebral arteries appear normal. Venous sinuses: Patent and normal. Anatomic variants: None significant. Review of the MIP images confirms the above findings IMPRESSION: 1. No acute large  vessel occlusion. 2. Aortic atherosclerosis. 3. Mild atherosclerotic change at the right carotid bifurcation but no stenosis. 4. Right ICA stent in the distal siphon to supraclinoid ICA. No evidence of residual aneurysmal flow. 5. 11 mm nodule in the medial right upper lobe. This is new since 2017 and is worrisome for a possible lung cancer. Consider one of the following in 3 months for both low-risk and high-risk individuals: (a) repeat chest CT, (b) follow-up PET-CT, or (c) tissue sampling. This recommendation follows the consensus statement: Guidelines for Management of Incidental Pulmonary Nodules Detected on CT Images: From the Fleischner Society 2017; Radiology 2017; 284:228-243. Because of the presence of emphysema and pulmonary scarring, this patient is certainly high risk. Because this was not present in 2017, I think proceeding to PET scan or tissue sampling could be preferable. Aortic Atherosclerosis (ICD10-I70.0). Electronically Signed   By: Nelson Chimes M.D.   On: 11/13/2022 18:59   CT HEAD CODE STROKE WO CONTRAST  Result Date: 11/13/2022 CLINICAL DATA:  Code stroke.  Neuro deficit, acute, stroke suspected EXAM: CT HEAD WITHOUT CONTRAST TECHNIQUE: Contiguous axial images were obtained from the base of the skull through the vertex without intravenous contrast. RADIATION DOSE REDUCTION: This exam was performed according to the departmental dose-optimization program which includes automated exposure control, adjustment of the mA and/or kV  according to patient size and/or use of iterative reconstruction technique. COMPARISON:  CT head January 07, 2019. FINDINGS: Brain: Mild increase in thickness of a right subdural low-attenuation collection, measuring approximately 1.1 cm in thickness. Suspected trace left subdural low-attenuation fluid collection is unchanged. No evidence of acute large vascular territory infarct, or definitely acute hemorrhage. Mild associated mass effect without midline shift. Vascular: No hyperdense vessel identified. Right ICA stent. Calcific atherosclerosis. Skull: No acute fracture. Sinuses/Orbits: Fluid in left maxillary sinus. No acute orbital findings. Other: No mastoid effusions. ASPECTS [facility] Stroke Program Early CT Score) total score (0-10 with 10 being normal): 10. IMPRESSION: 1. Mild increase in thickness of a right subdural low-attenuation collection, measuring approximately 1.1 cm in thickness. Suspected trace left subdural low-attenuation fluid collection is unchanged. 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 Mild associated mass effect without midline shift. 2. Otherwise, no evidence of acute intracranial abnormality. Code stroke imaging results were communicated on 11/13/2022 at 6:38 pm to provider Baghat via telephone, who verbally acknowledged these results. Electronically Signed   By: Margaretha Sheffield M.D.   On: 11/13/2022 18:40    EKG: Independently reviewed.  Sinus rhythm, no significant change since prior tracing.  Assessment and Plan  Acute CVA Patient presenting with left upper and lower extremity weakness.  Out of tPA window at the time of arrival and imaging not consistent with LVO. MRI brain without contrast showing small area of acute or early subacute ischemia within the right corona radiata.  Chronic right subdural hematoma.  Neurology recommending further stroke workup with A1c, lipid panel, echo, telemetry, PT/OT/SLP eval. Management per neurology  recommendations.  Seizure disorder Pharmacy med rec pending.  Keppra level pending.  Incidental pulmonary nodule CTA head and neck showing a 11 mm nodule in the medial right upper lobe which is new since 2017 and worrisome for possible lung cancer.  Patient has 60-pack-year smoking history and continues to smoke. Radiologist recommending PET scan or tissue sampling for further evaluation. I have discussed CT findings and recommendations with the patient and his wife.  Encouraged patient to stop smoking and offered nicotine patch but he declines at this  time.  I have ordered a dedicated CT chest for further evaluation.  He will need close outpatient follow-up with oncology.  Cough In the setting of chronic cigarette smoking.  Not tachypneic or hypoxic.  CT chest ordered as mentioned above.  Also test for COVID/flu/RSV.  Hypertension Allow permissive hypertension at this time.  DVT prophylaxis: SCDs Code Status: Full Code (discussed with the patient and his wife) Family Communication: Wife at bedside. Consults called: Neurology Level of care: Telemetry bed Admission status: It is my clinical opinion that referral for OBSERVATION is reasonable and necessary in this patient based on the above information provided. The aforementioned taken together are felt to place the patient at high risk for further clinical deterioration. However, it is anticipated that the patient may be medically stable for discharge from the hospital within 24 to 48 hours.   Shela Leff MD [facility]  If 7PM-7AM, please contact night-coverage [email or web address]  11/13/2022, 9:08 PM

## 2022-11-13 NOTE — ED Notes (Signed)
ED TO INPATIENT HANDOFF REPORT  ED Nurse Name and Phone #: [phone] - Park Pope, RN   S Name/Age/Gender Damon Parrish 82 y.o. male Room/Bed: 029C/029C  Code Status   Code Status: Full Code  Home/SNF/Other Home Patient oriented to: self, place, time, and situation Is this baseline? Yes   Triage Complete: Triage complete  Chief Complaint Acute CVA (cerebrovascular accident) [facility]) [I63.9]  Triage Note Pt with hx CVA reports sudden onset of L sided weakness one hour ago. Also reports L sided sensory deficit.    Allergies Allergies  Allergen Reactions   Cephalosporins Rash    Full body rash with systemic symptoms   Vancomycin Rash    Full body rash with systemic symptoms    Level of Care/Admitting Diagnosis ED Disposition     ED Disposition  Admit   Condition  --   [place]: [place] [[id number]]  Level of Care: Telemetry Medical [104]  May place patient in observation at [facility] or [place] if equivalent level of care is available:: Yes  Covid Evaluation: Asymptomatic - no recent exposure (last 10 days) testing not required  Diagnosis: Acute CVA (cerebrovascular accident) [facility]) [id number]  Admitting Physician: Shela Leff [id number]  Attending Physician: Shela Leff [id number]          B Medical/Surgery History 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) 12/27/2015; 12/28/2015   Stroke ([place]) 12/2015   denies residual on  01/09/2018)   Past Surgical History:  Procedure Laterality Date   CATARACT EXTRACTION W/ INTRAOCULAR LENS  IMPLANT, BILATERAL Bilateral    COLONOSCOPY     2013 per patient: done in [place], normal, next colonoscopy due in 10 years   FOOT FRACTURE SURGERY Left    "pins, etc. in there"   [place] N/A 05/04/2015   Procedure: Pipeline Embolization;  Surgeon: Consuella Lose, MD;  Location: [facility];  Service: Radiology;  Laterality: N/A;   RADIOLOGY WITH ANESTHESIA N/A 05/31/2015   Procedure: Pipeline Embolization;  Surgeon: Consuella Lose, MD;  Location: [place];  Service: Radiology;  Laterality: N/A;     A IV Location/Drains/Wounds Patient Lines/Drains/Airways Status     Active Line/Drains/Airways     Name Placement date Placement time Site Days   Peripheral IV 11/13/22 20 G 1" Anterior;Distal;Left;Upper Arm 11/13/22  1848  Arm  less than 1   Pressure Injury 01/09/18 Stage I -  Intact skin with non-blanchable redness of a localized area usually over a bony prominence. 01/09/18  1743  -- 1769   Pressure Injury 01/09/18 Stage I -  Intact skin with non-blanchable redness of a localized area usually over a bony prominence. 01/09/18  1744  -- 1769            Intake/Output Last 24 hours No intake or output data in the 24 hours ending 11/13/22 2251  Labs/Imaging Results for orders placed or performed during the hospital encounter of 11/13/22 (from the past 48 hour(s))  CBG monitoring, ED     Status: Abnormal   Collection Time: 11/13/22  6:17 PM  Result Value Ref Range   Glucose-Capillary 122 (H) 70 - 99 mg/dL    Comment: Glucose reference range applies only to samples taken after fasting for at least 8 hours.  Protime-INR     Status: None   Collection Time: 11/13/22  6:20 PM  Result Value Ref Range   Prothrombin Time 14.3 11.4 - 15.2 seconds   INR 1.1 0.8 - 1.2    Comment: (NOTE) INR goal varies based on device and disease  states. Performed at [facility], [place] [address], [place], [place] [address]   APTT     Status: None   Collection Time: 11/13/22  6:20 PM  Result Value Ref Range   aPTT 33 24 - 36 seconds    Comment: Performed at [facility] [address], [place], [place] [address]  CBC     Status: Abnormal   Collection Time: 11/13/22  6:20 PM  Result Value Ref Range   WBC 9.7 4.0 - 10.5 K/uL   RBC 4.44 4.22 - 5.81 MIL/uL   Hemoglobin 15.3 13.0 - 17.0 g/dL   HCT 43.6 39.0 - 52.0 %   MCV 98.2 80.0 - 100.0 fL   MCH 34.5 (H) 26.0 - 34.0 pg   MCHC 35.1 30.0 - 36.0 g/dL   RDW 12.2 11.5 - 15.5 %   Platelets 254 150 - 400 K/uL   nRBC 0.0 0.0 - 0.2 %    Comment: Performed at [facility], [place] [address] [place], [place] [address]  Differential     Status: None   Collection Time: 11/13/22  6:20 PM  Result Value Ref Range   Neutrophils Relative % 58 %   Neutro Abs 5.6 1.7 - 7.7 K/uL   Lymphocytes Relative 33 %   Lymphs Abs 3.2 0.7 - 4.0 K/uL   Monocytes Relative 7 %   Monocytes Absolute 0.6 0.1 - 1.0 K/uL   Eosinophils Relative 2 %   Eosinophils Absolute 0.2 0.0 - 0.5 K/uL   Basophils Relative 0 %   Basophils Absolute 0.0 0.0 - 0.1 K/uL   Immature Granulocytes 0 %   Abs Immature Granulocytes 0.04 0.00 - 0.07 K/uL    Comment: Performed at [facility] [address], [place], [place] [address]  Comprehensive metabolic panel     Status: Abnormal   Collection Time: 11/13/22  6:20 PM  Result Value Ref Range   Sodium 131 (L) 135 - 145 mmol/L   Potassium 4.3 3.5 - 5.1 mmol/L    Comment: HEMOLYSIS AT THIS LEVEL MAY AFFECT RESULT   Chloride 98 98 - 111 mmol/L   CO2 22 22 - 32 mmol/L   Glucose, Bld 130 (H) 70 - 99 mg/dL    Comment: Glucose reference range applies only to samples taken after fasting for at least 8 hours.   BUN 18 8 - 23 mg/dL   Creatinine, Ser 1.01 0.61 - 1.24 mg/dL   Calcium 9.5 8.9 - 10.3 mg/dL   Total Protein 6.7 6.5 - 8.1 g/dL   Albumin 4.0 3.5 -  5.0 g/dL   AST 33 15 - 41 U/L    Comment: HEMOLYSIS AT THIS LEVEL MAY AFFECT RESULT   ALT 27 0 - 44 U/L    Comment:  HEMOLYSIS AT THIS LEVEL MAY AFFECT RESULT   Alkaline Phosphatase 106 38 - 126 U/L   Total Bilirubin 1.0 0.3 - 1.2 mg/dL    Comment: HEMOLYSIS AT THIS LEVEL MAY AFFECT RESULT   GFR, Estimated >60 >60 mL/min    Comment: (NOTE) Calculated using the CKD-EPI Creatinine Equation (2021)    Anion gap 11 5 - 15    Comment: Performed at [facility] [address], [place], [place] [address]  I-stat chem 8, ED     Status: Abnormal   Collection Time: 11/13/22  6:29 PM  Result Value Ref Range   Sodium 132 (L) 135 - 145 mmol/L   Potassium 4.3 3.5 - 5.1 mmol/L   Chloride 100 98 - 111 mmol/L   BUN 22 8 - 23 mg/dL   Creatinine, Ser 1.00 0.61 - 1.24 mg/dL   Glucose, Bld 125 (H) 70 - 99 mg/dL    Comment: Glucose reference range applies only to samples taken after fasting for at least 8 hours.   Calcium, Ion 1.07 (L) 1.15 - 1.40 mmol/L   TCO2 22 22 - 32 mmol/L   Hemoglobin 15.3 13.0 - 17.0 g/dL   HCT 45.0 39.0 - 52.0 %  Ethanol     Status: None   Collection Time: 11/13/22  6:30 PM  Result Value Ref Range   Alcohol, Ethyl (B) <10 <10 mg/dL    Comment: (NOTE) Lowest detectable limit for serum alcohol is 10 mg/dL.  For medical purposes only. Performed at [facility], [place] [address], [place], [place] [address]   Urine rapid drug screen (hosp performed)     Status: None   Collection Time: 11/13/22  7:37 PM  Result Value Ref Range   Opiates NONE DETECTED NONE DETECTED   Cocaine NONE DETECTED NONE DETECTED   Benzodiazepines NONE DETECTED NONE DETECTED   Amphetamines NONE DETECTED NONE DETECTED   Tetrahydrocannabinol NONE DETECTED NONE DETECTED   Barbiturates NONE DETECTED NONE DETECTED    Comment: (NOTE) DRUG SCREEN FOR MEDICAL PURPOSES ONLY.  IF CONFIRMATION IS NEEDED FOR ANY PURPOSE, NOTIFY LAB WITHIN 5 DAYS.  LOWEST DETECTABLE LIMITS FOR URINE DRUG  SCREEN Drug Class                     Cutoff (ng/mL) Amphetamine and metabolites    1000 Barbiturate and metabolites    200 Benzodiazepine                 200 Opiates and metabolites        300 Cocaine and metabolites        300 THC                            50 Performed at [facility], [place] [address] [place], [place] [address]   Urinalysis, Routine w reflex microscopic -Urine, Clean Catch     Status: Abnormal   Collection Time: 11/13/22  7:37 PM  Result Value Ref Range   Color, Urine STRAW (A) YELLOW   APPearance CLEAR CLEAR   Specific Gravity, Urine 1.029 1.005 - 1.030   pH 7.0 5.0 - 8.0   Glucose, UA NEGATIVE NEGATIVE mg/dL   Hgb urine dipstick NEGATIVE NEGATIVE   Bilirubin Urine NEGATIVE NEGATIVE   Ketones, ur NEGATIVE NEGATIVE mg/dL   Protein, ur NEGATIVE NEGATIVE mg/dL   Nitrite NEGATIVE NEGATIVE   Leukocytes,Ua NEGATIVE NEGATIVE    Comment: Performed at  [facility], [place] [address] [place], [place] [address]   MR BRAIN WO CONTRAST  Result Date: 11/13/2022 CLINICAL DATA:  Acute neurologic deficit EXAM: MRI HEAD WITHOUT CONTRAST TECHNIQUE: Multiplanar, multiecho pulse sequences of the brain and surrounding structures were obtained without intravenous contrast. COMPARISON:  12/30/2015 FINDINGS: Brain: Small area of abnormal diffusion restriction within the right corona radiata. Chronic right subdural hematoma. No acute hemorrhage. There is multifocal hyperintense T2-weighted signal within the white matter. Generalized volume loss. The midline structures are normal. Vascular: Major flow voids are preserved. Skull and upper cervical spine: Normal calvarium and skull base. Visualized upper cervical spine and soft tissues are normal. Sinuses/Orbits:No paranasal sinus fluid levels or advanced mucosal thickening. No mastoid or middle ear effusion. Normal orbits. IMPRESSION: 1. Small area of acute or early subacute ischemia within the right corona radiata. 2. Chronic right  subdural hematoma. Electronically Signed   By: Ulyses Jarred M.D.   On: 11/13/2022 20:48   CT ANGIO HEAD NECK W WO CM (CODE STROKE)  Result Date: 11/13/2022 CLINICAL DATA:  Neuro deficit, acute, stroke suspected. Left arm numbness and weakness. EXAM: CT ANGIOGRAPHY HEAD AND NECK TECHNIQUE: Multidetector CT imaging of the head and neck was performed using the standard protocol during bolus administration of intravenous contrast. Multiplanar CT image reconstructions and MIPs were obtained to evaluate the vascular anatomy. Carotid stenosis measurements (when applicable) are obtained utilizing NASCET criteria, using the distal internal carotid diameter as the denominator. RADIATION DOSE REDUCTION: This exam was performed according to the departmental dose-optimization program which includes automated exposure control, adjustment of the mA and/or kV according to patient size and/or use of iterative reconstruction technique. CONTRAST:  53m OMNIPAQUE IOHEXOL 350 MG/ML SOLN COMPARISON:  Head CT same day FINDINGS: CTA NECK FINDINGS Aortic arch: Aortic atherosclerosis. Branching pattern is normal without origin stenosis. Right carotid system: Common carotid artery widely patent to the bifurcation. Mild calcified plaque at the carotid bifurcation but no stenosis. Cervical ICA widely patent. Left carotid system: Common carotid artery widely patent to the bifurcation. Carotid bifurcation is normal. Cervical ICA is normal. Vertebral arteries: Both vertebral artery origins are widely patent. Both vertebral arteries appear normal through the cervical region to the foramen magnum. Skeleton: Chronic cervical spondylosis.  No acute finding. Other neck: No mass or lymphadenopathy. Upper chest: Emphysema and pulmonary scarring. Old rib fractures on the right. 11 mm nodule in the medial right upper lobe. This is new since 2017 and is worrisome for a possible lung cancer. Review of the MIP images confirms the above findings CTA HEAD  FINDINGS Anterior circulation: Both internal carotid arteries are patent through the skull base and siphon regions. Ordinary siphon atherosclerosis. Carotid artery stent in the distal siphon to supraclinoid ICA on the right. No evidence of residual aneurysmal flow. The stent is patent. Flow is present in the anterior and middle cerebral arteries. No large vessel occlusion. Posterior circulation: Both vertebral arteries are patent through the foramen magnum to the basilar artery. No basilar stenosis. Superior cerebellar arteries and posterior cerebral arteries appear normal. Venous sinuses: Patent and normal. Anatomic variants: None significant. Review of the MIP images confirms the above findings IMPRESSION: 1. No acute large vessel occlusion. 2. Aortic atherosclerosis. 3. Mild atherosclerotic change at the right carotid bifurcation but no stenosis. 4. Right ICA stent in the distal siphon to supraclinoid ICA. No evidence of residual aneurysmal flow. 5. 11 mm nodule in the medial right upper lobe. This is new since 2017 and is worrisome  for a possible lung cancer. Consider one of the following in 3 months for both low-risk and high-risk individuals: (a) repeat chest CT, (b) follow-up PET-CT, or (c) tissue sampling. This recommendation follows the consensus statement: Guidelines for Management of Incidental Pulmonary Nodules Detected on CT Images: From the Fleischner Society 2017; Radiology 2017; 284:228-243. Because of the presence of emphysema and pulmonary scarring, this patient is certainly high risk. Because this was not present in 2017, I think proceeding to PET scan or tissue sampling could be preferable. Aortic Atherosclerosis (ICD10-I70.0). Electronically Signed   By: Nelson Chimes M.D.   On: 11/13/2022 18:59   CT HEAD CODE STROKE WO CONTRAST  Result Date: 11/13/2022 CLINICAL DATA:  Code stroke.  Neuro deficit, acute, stroke suspected EXAM: CT HEAD WITHOUT CONTRAST TECHNIQUE: Contiguous axial images were  obtained from the base of the skull through the vertex without intravenous contrast. RADIATION DOSE REDUCTION: This exam was performed according to the departmental dose-optimization program which includes automated exposure control, adjustment of the mA and/or kV according to patient size and/or use of iterative reconstruction technique. COMPARISON:  CT head January 07, 2019. FINDINGS: Brain: Mild increase in thickness of a right subdural low-attenuation collection, measuring approximately 1.1 cm in thickness. Suspected trace left subdural low-attenuation fluid collection is unchanged. No evidence of acute large vascular territory infarct, or definitely acute hemorrhage. Mild associated mass effect without midline shift. Vascular: No hyperdense vessel identified. Right ICA stent. Calcific atherosclerosis. Skull: No acute fracture. Sinuses/Orbits: Fluid in left maxillary sinus. No acute orbital findings. Other: No mastoid effusions. ASPECTS [facility] Stroke Program Early CT Score) total score (0-10 with 10 being normal): 10. IMPRESSION: 1. Mild increase in thickness of a right subdural low-attenuation collection, measuring approximately 1.1 cm in thickness. Suspected trace left subdural low-attenuation fluid collection is unchanged. 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 Mild associated mass effect without midline shift. 2. Otherwise, no evidence of acute intracranial abnormality. Code stroke imaging results were communicated on 11/13/2022 at 6:38 pm to provider [place] via telephone, who verbally acknowledged these results. Electronically Signed   By: Margaretha Sheffield M.D.   On: 11/13/2022 18:40    Pending Labs Unresulted Labs (From admission, onward)     Start     Ordered   11/14/22 0500  Lipid panel  (Labs)  Tomorrow morning,   R       Comments: Fasting    11/13/22 2220   11/13/22 2220  Resp panel by RT-PCR (RSV, Flu A&B, Covid) Anterior Nasal Swab  (Tier 2 -  SymptomaticResp panel by RT-PCR (RSV, Flu A&B, Covid))  Once,   R        11/13/22 2220   11/13/22 2218  Hemoglobin A1c  (Labs)  Once,   R       Comments: To assess prior glycemic control    11/13/22 2220   11/13/22 1850  Levetiracetam level  Once,   URGENT        11/13/22 1849            Vitals/Pain Today's Vitals   11/13/22 2045 11/13/22 2100 11/13/22 2115 11/13/22 2222  BP: 135/86 (!) 151/88 (!) 144/84   Pulse: 77 79 77   Resp: 16 15 15   $ Temp:    98.2 F (36.8 C)  TempSrc:    Oral  SpO2: 94% 95% 94%   PainSc:        Isolation Precautions Airborne and Contact precautions  Medications Medications   stroke: early  stages of recovery book (has no administration in time range)  acetaminophen (TYLENOL) tablet 650 mg (has no administration in time range)    Or  acetaminophen (TYLENOL) 160 MG/5ML solution 650 mg (has no administration in time range)    Or  acetaminophen (TYLENOL) suppository 650 mg (has no administration in time range)  iohexol (OMNIPAQUE) 350 MG/ML injection 75 mL (75 mLs Intravenous Contrast Given 11/13/22 1847)    Mobility walks with device     Focused Assessments Neuro Assessment Handoff:  Swallow screen pass? Yes  Cardiac Rhythm: Normal sinus rhythm NIH Stroke Scale  Dizziness Present: No Headache Present: No Interval: Shift assessment Level of Consciousness (1a.)   : Alert, keenly responsive LOC Questions (1b. )   : Answers both questions correctly LOC Commands (1c. )   : Performs both tasks correctly Best Gaze (2. )  : Normal Visual (3. )  : No visual loss Facial Palsy (4. )    : Normal symmetrical movements Motor Arm, Left (5a. )   : No drift Motor Arm, Right (5b. ) : No drift Motor Leg, Left (6a. )  : Drift Motor Leg, Right (6b. ) : No drift Limb Ataxia (7. ): Present in one limb Sensory (8. )  : Normal, no sensory loss Best Language (9. )  : No aphasia Dysarthria (10. ): Normal Extinction/Inattention (11.)   : No  Abnormality Complete NIHSS TOTAL: 2 Last date known well: 11/13/22 Last time known well:  (earlier this morning, shaving) Neuro Assessment: Exceptions to [facility] Neuro Checks:   Initial (11/13/22 1855)  Has TPA been given? No If patient is a Neuro Trauma and patient is going to OR before floor call report to [place] nurse: [phone] or [phone]   R Recommendations: See Admitting Provider Note  Report given to:   Additional Notes: Alert, oriented, x 4. Uses cane as needed but has required more use here lately. Sx began this morning but worsened throughout the day. MRI + for acute CVA. Swallow screen passed. NIH- ~a 2. Left arm weakness and some ataxia. Has had some left leg weakness as well. Very pleasant individual. Wife accompanies him but says she will probably go home.

## 2022-11-14 ENCOUNTER — Observation Stay (HOSPITAL_COMMUNITY): Payer: Medicare Other

## 2022-11-14 ENCOUNTER — Inpatient Hospital Stay (HOSPITAL_COMMUNITY): Payer: Medicare Other

## 2022-11-14 ENCOUNTER — Encounter (HOSPITAL_COMMUNITY): Payer: Self-pay | Admitting: Internal Medicine

## 2022-11-14 DIAGNOSIS — R29810 Facial weakness: Secondary | ICD-10-CM | POA: Diagnosis present

## 2022-11-14 DIAGNOSIS — I10 Essential (primary) hypertension: Secondary | ICD-10-CM | POA: Diagnosis present

## 2022-11-14 DIAGNOSIS — R569 Unspecified convulsions: Secondary | ICD-10-CM | POA: Diagnosis not present

## 2022-11-14 DIAGNOSIS — R911 Solitary pulmonary nodule: Secondary | ICD-10-CM

## 2022-11-14 DIAGNOSIS — K5900 Constipation, unspecified: Secondary | ICD-10-CM | POA: Diagnosis present

## 2022-11-14 DIAGNOSIS — Z79899 Other long term (current) drug therapy: Secondary | ICD-10-CM | POA: Diagnosis not present

## 2022-11-14 DIAGNOSIS — R451 Restlessness and agitation: Secondary | ICD-10-CM | POA: Diagnosis not present

## 2022-11-14 DIAGNOSIS — N400 Enlarged prostate without lower urinary tract symptoms: Secondary | ICD-10-CM | POA: Diagnosis present

## 2022-11-14 DIAGNOSIS — I700 Atherosclerosis of aorta: Secondary | ICD-10-CM | POA: Diagnosis present

## 2022-11-14 DIAGNOSIS — R29702 NIHSS score 2: Secondary | ICD-10-CM | POA: Diagnosis present

## 2022-11-14 DIAGNOSIS — Z515 Encounter for palliative care: Secondary | ICD-10-CM | POA: Diagnosis not present

## 2022-11-14 DIAGNOSIS — I6203 Nontraumatic chronic subdural hemorrhage: Secondary | ICD-10-CM | POA: Diagnosis present

## 2022-11-14 DIAGNOSIS — Z7189 Other specified counseling: Secondary | ICD-10-CM | POA: Diagnosis not present

## 2022-11-14 DIAGNOSIS — I69393 Ataxia following cerebral infarction: Secondary | ICD-10-CM | POA: Diagnosis not present

## 2022-11-14 DIAGNOSIS — W19XXXA Unspecified fall, initial encounter: Secondary | ICD-10-CM | POA: Diagnosis present

## 2022-11-14 DIAGNOSIS — D181 Lymphangioma, any site: Secondary | ICD-10-CM | POA: Diagnosis present

## 2022-11-14 DIAGNOSIS — G8929 Other chronic pain: Secondary | ICD-10-CM | POA: Diagnosis present

## 2022-11-14 DIAGNOSIS — Z741 Need for assistance with personal care: Secondary | ICD-10-CM | POA: Diagnosis present

## 2022-11-14 DIAGNOSIS — Z881 Allergy status to other antibiotic agents status: Secondary | ICD-10-CM | POA: Diagnosis not present

## 2022-11-14 DIAGNOSIS — I69354 Hemiplegia and hemiparesis following cerebral infarction affecting left non-dominant side: Secondary | ICD-10-CM | POA: Diagnosis present

## 2022-11-14 DIAGNOSIS — R531 Weakness: Secondary | ICD-10-CM | POA: Diagnosis present

## 2022-11-14 DIAGNOSIS — G8194 Hemiplegia, unspecified affecting left nondominant side: Secondary | ICD-10-CM | POA: Diagnosis present

## 2022-11-14 DIAGNOSIS — Z8679 Personal history of other diseases of the circulatory system: Secondary | ICD-10-CM | POA: Diagnosis not present

## 2022-11-14 DIAGNOSIS — Z961 Presence of intraocular lens: Secondary | ICD-10-CM | POA: Diagnosis present

## 2022-11-14 DIAGNOSIS — Z87442 Personal history of urinary calculi: Secondary | ICD-10-CM | POA: Diagnosis not present

## 2022-11-14 DIAGNOSIS — I639 Cerebral infarction, unspecified: Secondary | ICD-10-CM | POA: Diagnosis present

## 2022-11-14 DIAGNOSIS — F17210 Nicotine dependence, cigarettes, uncomplicated: Secondary | ICD-10-CM | POA: Diagnosis present

## 2022-11-14 DIAGNOSIS — I63511 Cerebral infarction due to unspecified occlusion or stenosis of right middle cerebral artery: Secondary | ICD-10-CM | POA: Diagnosis not present

## 2022-11-14 DIAGNOSIS — Z1152 Encounter for screening for COVID-19: Secondary | ICD-10-CM | POA: Diagnosis not present

## 2022-11-14 DIAGNOSIS — Z716 Tobacco abuse counseling: Secondary | ICD-10-CM | POA: Diagnosis not present

## 2022-11-14 DIAGNOSIS — E871 Hypo-osmolality and hyponatremia: Secondary | ICD-10-CM | POA: Diagnosis present

## 2022-11-14 DIAGNOSIS — K5901 Slow transit constipation: Secondary | ICD-10-CM | POA: Diagnosis not present

## 2022-11-14 DIAGNOSIS — E7800 Pure hypercholesterolemia, unspecified: Secondary | ICD-10-CM | POA: Diagnosis present

## 2022-11-14 DIAGNOSIS — Z9842 Cataract extraction status, left eye: Secondary | ICD-10-CM | POA: Diagnosis not present

## 2022-11-14 DIAGNOSIS — I6389 Other cerebral infarction: Secondary | ICD-10-CM | POA: Diagnosis present

## 2022-11-14 DIAGNOSIS — J439 Emphysema, unspecified: Secondary | ICD-10-CM | POA: Diagnosis present

## 2022-11-14 DIAGNOSIS — K219 Gastro-esophageal reflux disease without esophagitis: Secondary | ICD-10-CM | POA: Diagnosis present

## 2022-11-14 DIAGNOSIS — Z7982 Long term (current) use of aspirin: Secondary | ICD-10-CM | POA: Diagnosis not present

## 2022-11-14 DIAGNOSIS — Z9841 Cataract extraction status, right eye: Secondary | ICD-10-CM | POA: Diagnosis not present

## 2022-11-14 DIAGNOSIS — G40909 Epilepsy, unspecified, not intractable, without status epilepticus: Secondary | ICD-10-CM | POA: Diagnosis present

## 2022-11-14 DIAGNOSIS — H409 Unspecified glaucoma: Secondary | ICD-10-CM | POA: Diagnosis present

## 2022-11-14 DIAGNOSIS — M545 Low back pain, unspecified: Secondary | ICD-10-CM | POA: Diagnosis present

## 2022-11-14 DIAGNOSIS — R270 Ataxia, unspecified: Secondary | ICD-10-CM | POA: Diagnosis present

## 2022-11-14 DIAGNOSIS — Z807 Family history of other malignant neoplasms of lymphoid, hematopoietic and related tissues: Secondary | ICD-10-CM | POA: Diagnosis not present

## 2022-11-14 DIAGNOSIS — M1990 Unspecified osteoarthritis, unspecified site: Secondary | ICD-10-CM | POA: Diagnosis present

## 2022-11-14 LAB — ECHOCARDIOGRAM COMPLETE
AR max vel: 2.73 cm2
AV Area VTI: 2.47 cm2
AV Area mean vel: 2.52 cm2
AV Mean grad: 3 mmHg
AV Peak grad: 6 mmHg
Ao pk vel: 1.22 m/s
Area-P 1/2: 3.76 cm2
Height: 69 in
S' Lateral: 3.2 cm
Weight: 2860.69 oz

## 2022-11-14 LAB — RESP PANEL BY RT-PCR (RSV, FLU A&B, COVID)  RVPGX2
Influenza A by PCR: NEGATIVE
Influenza B by PCR: NEGATIVE
Resp Syncytial Virus by PCR: NEGATIVE
SARS Coronavirus 2 by RT PCR: NEGATIVE

## 2022-11-14 LAB — LIPID PANEL
Cholesterol: 146 mg/dL (ref 0–200)
HDL: 46 mg/dL (ref >40)
LDL Cholesterol: 81 mg/dL (ref 0–99)
Total CHOL/HDL Ratio: 3.2 RATIO
Triglycerides: 96 mg/dL (ref <150)
VLDL: 19 mg/dL (ref 0–40)

## 2022-11-14 MED ORDER — LEVETIRACETAM 500 MG PO TABS
500.0000 mg | ORAL_TABLET | Freq: Two times a day (BID) | ORAL | Status: DC
  Administered 2022-11-14 – 2022-11-15 (×3): 500 mg via ORAL
  Filled 2022-11-14 (×3): qty 1

## 2022-11-14 MED ORDER — ATORVASTATIN CALCIUM 80 MG PO TABS
80.0000 mg | ORAL_TABLET | Freq: Every day | ORAL | Status: DC
  Administered 2022-11-14: 80 mg via ORAL
  Filled 2022-11-14: qty 1

## 2022-11-14 MED ORDER — LORAZEPAM 2 MG/ML IJ SOLN
2.0000 mg | Freq: Once | INTRAMUSCULAR | Status: AC
  Administered 2022-11-14: 2 mg via INTRAVENOUS
  Filled 2022-11-14: qty 1

## 2022-11-14 MED ORDER — CLOPIDOGREL BISULFATE 75 MG PO TABS
75.0000 mg | ORAL_TABLET | Freq: Every day | ORAL | Status: DC
  Administered 2022-11-14 – 2022-11-15 (×2): 75 mg via ORAL
  Filled 2022-11-14: qty 1

## 2022-11-14 MED ORDER — ASPIRIN 81 MG PO TBEC
81.0000 mg | DELAYED_RELEASE_TABLET | Freq: Every day | ORAL | Status: DC
  Administered 2022-11-14 – 2022-11-15 (×2): 81 mg via ORAL
  Filled 2022-11-14 (×2): qty 1

## 2022-11-14 MED ORDER — FINASTERIDE 5 MG PO TABS
5.0000 mg | ORAL_TABLET | Freq: Every day | ORAL | Status: DC
  Administered 2022-11-14 – 2022-11-15 (×2): 5 mg via ORAL
  Filled 2022-11-14 (×2): qty 1

## 2022-11-14 MED ORDER — IPRATROPIUM-ALBUTEROL 0.5-2.5 (3) MG/3ML IN SOLN: 3.0000 mL | Freq: Four times a day (QID) | RESPIRATORY_TRACT | Status: DC | PRN

## 2022-11-14 MED ORDER — ENOXAPARIN SODIUM 40 MG/0.4ML IJ SOSY
40.0000 mg | PREFILLED_SYRINGE | INTRAMUSCULAR | Status: DC
  Administered 2022-11-14: 40 mg via SUBCUTANEOUS
  Filled 2022-11-14: qty 0.4

## 2022-11-14 MED ORDER — ATORVASTATIN CALCIUM 40 MG PO TABS: 40.0000 mg | ORAL_TABLET | Freq: Every day | ORAL | Status: DC

## 2022-11-14 MED ORDER — CLOPIDOGREL BISULFATE 75 MG PO TABS
300.0000 mg | ORAL_TABLET | Freq: Once | ORAL | Status: DC
  Filled 2022-11-14: qty 4

## 2022-11-14 NOTE — Progress Notes (Signed)
STROKE TEAM PROGRESS NOTE   INTERVAL HISTORY His wife is at the bedside.  No acute events overnight.  Patient reports that he feels "much better" this am.  When speaking with he and his wife, they both report that patient had a fall January 11th, and has not felt his best since then, including c/o of chest pain, wheezing, increased cough, and generalized weakness. This is concerning when combined with the CT chest finding.   Wife also stated that patient is still on Keppra and is good with his medication compliance. He was supposed to be weaned off of it back in December. They had an appt with his neurologist, Dr. Merlene Laughter, but was told his office was closed until further notice. (I attempted to contact the office but only got voicemails, and the website was not available.)   Recommend Loop Recorder on discharge.  Vitals:   11/13/22 2315 11/13/22 2353 11/14/22 0356 11/14/22 0754  BP: 130/87 (!) 143/87 (!) 144/84 124/80  Pulse: 77  77 71  Resp: 15 16 14 17  $ Temp:   98.5 F (36.9 C) 98.3 F (36.8 C)  TempSrc:    Oral  SpO2: 95% 98% 96% 92%  Weight:  81.1 kg    Height:  5' 9"$  (1.753 m)     CBC:  Recent Labs  Lab 11/13/22 1820 11/13/22 1829  WBC 9.7  --   NEUTROABS 5.6  --   HGB 15.3 15.3  HCT 43.6 45.0  MCV 98.2  --   PLT 254  --    Basic Metabolic Panel:  Recent Labs  Lab 11/13/22 1820 11/13/22 1829  NA 131* 132*  K 4.3 4.3  CL 98 100  CO2 22  --   GLUCOSE 130* 125*  BUN 18 22  CREATININE 1.01 1.00  CALCIUM 9.5  --    Lipid Panel:  Recent Labs  Lab 11/14/22 0018  CHOL 146  TRIG 96  HDL 46  CHOLHDL 3.2  VLDL 19  LDLCALC 81   HgbA1c:  Recent Labs  Lab 11/13/22 1820  HGBA1C 6.1*   Urine Drug Screen:  Recent Labs  Lab 11/13/22 1937  LABOPIA NONE DETECTED  COCAINSCRNUR NONE DETECTED  LABBENZ NONE DETECTED  AMPHETMU NONE DETECTED  THCU NONE DETECTED  LABBARB NONE DETECTED    Alcohol Level  Recent Labs  Lab 11/13/22 1830  ETH <10    IMAGING  past 24 hours CT CHEST WO CONTRAST  Result Date: 11/13/2022 CLINICAL DATA:  Right upper lobe lung nodule on earlier CT angiography EXAM: CT CHEST WITHOUT CONTRAST TECHNIQUE: Multidetector CT imaging of the chest was performed following the standard protocol without IV contrast. RADIATION DOSE REDUCTION: This exam was performed according to the departmental dose-optimization program which includes automated exposure control, adjustment of the mA and/or kV according to patient size and/or use of iterative reconstruction technique. COMPARISON:  11/13/2022 FINDINGS: Cardiovascular: Unenhanced imaging of the heart is unremarkable without pericardial effusion. Normal caliber of the thoracic aorta. Atherosclerosis of the aorta and coronary vasculature. Evaluation of the vascular lumen is limited without IV contrast. Mediastinum/Nodes: No enlarged mediastinal or axillary lymph nodes. Thyroid gland, trachea, and esophagus demonstrate no significant findings. Lungs/Pleura: No acute airspace disease, effusion, or pneumothorax. Scattered areas of scarring are seen at the lung bases. Upper lobe predominant emphysema. There is a slightly spiculated subpleural right upper lobe pulmonary nodule measuring 15 x 10 x 14 mm, reference image 24/4. Upper Abdomen: No acute abnormality. Musculoskeletal: No acute or destructive bony lesions.  Chronic right-sided fourth through sixth rib fractures are noted. Reconstructed images demonstrate no additional findings. IMPRESSION: 1. 13 mm suspicious right solid pulmonary nodule within the upper lobe. If the patient would be a therapy candidate should neoplasm be detected, oncology referral may be useful. Per Fleischner Society Guidelines, consider a non-contrast Chest CT at 3 months, a PET/CT, or tissue sampling. These guidelines do not apply to immunocompromised patients and patients with cancer. Follow up in patients with significant comorbidities as clinically warranted. For lung cancer  screening, adhere to Lung-RADS guidelines. Reference: Radiology. 2017; 284(1):228-43. 2. Aortic Atherosclerosis (ICD10-I70.0) and Emphysema (ICD10-J43.9). Electronically Signed   By: Randa Ngo M.D.   On: 11/13/2022 23:04   MR BRAIN WO CONTRAST  Result Date: 11/13/2022 CLINICAL DATA:  Acute neurologic deficit EXAM: MRI HEAD WITHOUT CONTRAST TECHNIQUE: Multiplanar, multiecho pulse sequences of the brain and surrounding structures were obtained without intravenous contrast. COMPARISON:  12/30/2015 FINDINGS: Brain: Small area of abnormal diffusion restriction within the right corona radiata. Chronic right subdural hematoma. No acute hemorrhage. There is multifocal hyperintense T2-weighted signal within the white matter. Generalized volume loss. The midline structures are normal. Vascular: Major flow voids are preserved. Skull and upper cervical spine: Normal calvarium and skull base. Visualized upper cervical spine and soft tissues are normal. Sinuses/Orbits:No paranasal sinus fluid levels or advanced mucosal thickening. No mastoid or middle ear effusion. Normal orbits. IMPRESSION: 1. Small area of acute or early subacute ischemia within the right corona radiata. 2. Chronic right subdural hematoma. Electronically Signed   By: Ulyses Jarred M.D.   On: 11/13/2022 20:48   CT ANGIO HEAD NECK W WO CM (CODE STROKE)  Result Date: 11/13/2022 CLINICAL DATA:  Neuro deficit, acute, stroke suspected. Left arm numbness and weakness. EXAM: CT ANGIOGRAPHY HEAD AND NECK TECHNIQUE: Multidetector CT imaging of the head and neck was performed using the standard protocol during bolus administration of intravenous contrast. Multiplanar CT image reconstructions and MIPs were obtained to evaluate the vascular anatomy. Carotid stenosis measurements (when applicable) are obtained utilizing NASCET criteria, using the distal internal carotid diameter as the denominator. RADIATION DOSE REDUCTION: This exam was performed according to  the departmental dose-optimization program which includes automated exposure control, adjustment of the mA and/or kV according to patient size and/or use of iterative reconstruction technique. CONTRAST:  67m OMNIPAQUE IOHEXOL 350 MG/ML SOLN COMPARISON:  Head CT same day FINDINGS: CTA NECK FINDINGS Aortic arch: Aortic atherosclerosis. Branching pattern is normal without origin stenosis. Right carotid system: Common carotid artery widely patent to the bifurcation. Mild calcified plaque at the carotid bifurcation but no stenosis. Cervical ICA widely patent. Left carotid system: Common carotid artery widely patent to the bifurcation. Carotid bifurcation is normal. Cervical ICA is normal. Vertebral arteries: Both vertebral artery origins are widely patent. Both vertebral arteries appear normal through the cervical region to the foramen magnum. Skeleton: Chronic cervical spondylosis.  No acute finding. Other neck: No mass or lymphadenopathy. Upper chest: Emphysema and pulmonary scarring. Old rib fractures on the right. 11 mm nodule in the medial right upper lobe. This is new since 2017 and is worrisome for a possible lung cancer. Review of the MIP images confirms the above findings CTA HEAD FINDINGS Anterior circulation: Both internal carotid arteries are patent through the skull base and siphon regions. Ordinary siphon atherosclerosis. Carotid artery stent in the distal siphon to supraclinoid ICA on the right. No evidence of residual aneurysmal flow. The stent is patent. Flow is present in the anterior and middle cerebral arteries.  No large vessel occlusion. Posterior circulation: Both vertebral arteries are patent through the foramen magnum to the basilar artery. No basilar stenosis. Superior cerebellar arteries and posterior cerebral arteries appear normal. Venous sinuses: Patent and normal. Anatomic variants: None significant. Review of the MIP images confirms the above findings IMPRESSION: 1. No acute large vessel  occlusion. 2. Aortic atherosclerosis. 3. Mild atherosclerotic change at the right carotid bifurcation but no stenosis. 4. Right ICA stent in the distal siphon to supraclinoid ICA. No evidence of residual aneurysmal flow. 5. 11 mm nodule in the medial right upper lobe. This is new since 2017 and is worrisome for a possible lung cancer. Consider one of the following in 3 months for both low-risk and high-risk individuals: (a) repeat chest CT, (b) follow-up PET-CT, or (c) tissue sampling. This recommendation follows the consensus statement: Guidelines for Management of Incidental Pulmonary Nodules Detected on CT Images: From the Fleischner Society 2017; Radiology 2017; 284:228-243. Because of the presence of emphysema and pulmonary scarring, this patient is certainly high risk. Because this was not present in 2017, I think proceeding to PET scan or tissue sampling could be preferable. Aortic Atherosclerosis (ICD10-I70.0). Electronically Signed   By: Nelson Chimes M.D.   On: 11/13/2022 18:59   CT HEAD CODE STROKE WO CONTRAST  Result Date: 11/13/2022 CLINICAL DATA:  Code stroke.  Neuro deficit, acute, stroke suspected EXAM: CT HEAD WITHOUT CONTRAST TECHNIQUE: Contiguous axial images were obtained from the base of the skull through the vertex without intravenous contrast. RADIATION DOSE REDUCTION: This exam was performed according to the departmental dose-optimization program which includes automated exposure control, adjustment of the mA and/or kV according to patient size and/or use of iterative reconstruction technique. COMPARISON:  CT head January 07, 2019. FINDINGS: Brain: Mild increase in thickness of a right subdural low-attenuation collection, measuring approximately 1.1 cm in thickness. Suspected trace left subdural low-attenuation fluid collection is unchanged. No evidence of acute large vascular territory infarct, or definitely acute hemorrhage. Mild associated mass effect without midline shift. Vascular: No  hyperdense vessel identified. Right ICA stent. Calcific atherosclerosis. Skull: No acute fracture. Sinuses/Orbits: Fluid in left maxillary sinus. No acute orbital findings. Other: No mastoid effusions. ASPECTS [facility] Stroke Program Early CT Score) total score (0-10 with 10 being normal): 10. IMPRESSION: 1. Mild increase in thickness of a right subdural low-attenuation collection, measuring approximately 1.1 cm in thickness. Suspected trace left subdural low-attenuation fluid collection is unchanged. 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 Mild associated mass effect without midline shift. 2. Otherwise, no evidence of acute intracranial abnormality. Code stroke imaging results were communicated on 11/13/2022 at 6:38 pm to provider Baghat via telephone, who verbally acknowledged these results. Electronically Signed   By: Margaretha Sheffield M.D.   On: 11/13/2022 18:40    PHYSICAL EXAM  Temp:  [97.4 F (36.3 C)-98.5 F (36.9 C)] 98.1 F (36.7 C) (02/22 1126) Pulse Rate:  [71-80] 73 (02/22 1126) Resp:  [14-19] 19 (02/22 1126) BP: (117-159)/(73-88) 117/78 (02/22 1126) SpO2:  [92 %-98 %] 92 % (02/22 1126) Weight:  [81.1 kg] 81.1 kg (02/21 2353)  General - Well nourished, well developed, in no apparent distress. Cardiovascular - Regular rhythm and rate. Pulmonary - Unlabored breathing, no supplemental oxygen needed  Mental Status -  Level of arousal and orientation to time, place, and person were intact. Language including expression, naming, repetition, comprehension was assessed and found intact.  Cranial Nerves II - XII - II - Visual field intact OU. III,  IV, VI - Extraocular movements intact. V - Facial sensation intact bilaterally. VII - Facial movement intact bilaterally. VIII - Hearing & vestibular intact bilaterally. X - Palate elevates symmetrically. XI - Chin turning & shoulder shrug intact bilaterally. XII - Tongue protrusion intact.  Motor  Strength - The patient's strength was normal in RUE and RLE, however, LUE pronator drift and slight decreased LLE proximal strength.   Motor Tone - Muscle tone was assessed at the neck and appendages and was normal.  Sensory - Light touch  assessed and symmetrical.    Coordination - The patient had normal movements in the right FTN, however, ataxic on left FTN. Tremor present at baseline RUE.   Gait and Station - deferred.   ASSESSMENT/PLAN Mr. KENSINGTON SCHIESSER is a 82 y.o. 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  He woke up at 0700 in his normal state of health, went back to sleep and got back up at 1100. he noticed this morning when he was trying to shave, his left hand was not working and his left leg was weak / shaky. He then went to lunch and couldn't hold his fork, had difficulty holding his cane. Endorses smoking 1PPD. He is currently on aspirin 357m at home.   History of both stroke and seizures. [facility]  MRI showed patchy acute ischemia 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 He recovered well and had equal strength in all extremities noted at his last neurology appointment in 2018. Developed seizures at this same timeframe and was put on Keppra, then titrated off in 2018. However, in 2020 he was seen for abnormal left lower extremity movements that were thought to be focal seizures and his Keppra was resumed at 500 mg twice daily. He states he has been consistent with this dosing.   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 CTA head & neck: No acute  large vessel occlusion. Mild atherosclerotic change at the right carotid bifurcation but no stenosis. Right ICA stent in the distal siphon to supraclinoid ICA. No evidence of residual aneurysmal flow. MRI: Small area of acute or early subacute ischemia within the right corona radiata. 2D Echo: PENDING [place] venous doppler: Negative for DVT Recommend loop recorder prior to dicharge LDL 81 HgbA1c 6.1 VTE prophylaxis - lovenox aspirin 325 mg daily prior to admission, Now on aspirin 81 mg daily and clopidogrel 75 mg daily. Recommend Aspirin and Plavix x3weeks, then plavix alone.  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 Suspected trace left subdural low-attenuation fluid collection is unchanged. 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 Mild associated mass effect without midline shift. MRI Chronic right subdural hematoma Continue to observe  Lung nodule  CTA neck - 11 mm nodule in the medial right upper lobe. This is new since 2017 and is worrisome for a possible lung cancer CT chest 13 mm suspicious right solid pulmonary nodule within the upper lobe. If the patient would be a therapy candidate should neoplasm be detected, oncology  referral may be useful. Per Fleischner Society Guidelines, consider a non-contrast Chest CT at 3 months, a PET/CT, or tissue sampling. Management per primary team  Hx of right ICA siphon aneurysm Hx of stroke and seizure Status post pipeline stent right terminal ICA 2016 with Dr. Kathyrn Sheriff 12/2015 admitted for seizure, CT hygroma versus chronic SDH.  MRI showed right frontal parietal MCA/ACA punctate infarcts.  CT head and neck unremarkable.  Cerebral angiogram showed right ICA stent patent.  No DVT.  EF 55 to 60%.  EEG negative.  LDL 80, A1c 6.1.  Discharged on  Plavix, Lipitor and Keppra. Follow-up with neurology as outpatient, 30-day cardiac event monitoring negative.  Keppra gradually tapered off. 12/2018 seen by teleneurology for left upper extremity abnormal movement, concerning for seizure.  CT no acute abnormality.  Keppra restarted 500 twice daily Patient compliant with Keppra at home.  Hypertension Home meds:  norvasc Stable, denies hypotension at home Low BP Long-term BP goal normotensive  Hyperlipidemia Home meds:  Lipitor 5m LDL 81, goal < 70 Increased to Lipitor 80 Continue statin at discharge  Other Stroke Risk Factors Advanced Age >/= 644  Other Active Problems Mild hyponatremia, sodium 132  Hospital day # 0   Pt seen by Neuro NP/APP and later by MD. Note/plan to be edited by MD as needed.    EOtelia Santee DNP, AGACNP-BC [facility] Please use AMION for pager and EPIC for messaging  ATTENDING NOTE: I reviewed above note and agree with the assessment and plan. Pt was seen and examined.   Wife at the bedside.  Patient lying bed, no complaints.  Still has right mild hemiparesis especially right upper extremity pronator drift and ataxia.  Slight right lower extremity weakness.  Otherwise no significant neurodeficit.  PT/OT recommend CIR.  Patient had history of stroke and seizure on aspirin and Keppra at home.  This admission again showed right MCA/ACA punctate infarcts.  Right ICA stent patent, no significant stenosis right ICA bulb.  Etiology concerning for cardioembolic source.  Discussed with patient and wife, they are agreeable with loop recorder prior to discharge.  Currently on DAPT and increase Lipitor from 40-80.  2D echo pending.  For detailed assessment and plan, please refer to above/below as I have made changes wherever appropriate.   JRosalin Hawking MD PhD Stroke Neurology 11/14/2022 5:44 PM   To contact Stroke Continuity provider, please refer to [email or web address] After hours, contact General  Neurology

## 2022-11-14 NOTE — Consult Note (Signed)
Consultation Note Date: 11/14/2022   Patient Name: Damon Parrish  DOB: [birth year]  MRN: [id number]  Age / Sex: 82 y.o., male  PCP: Tobe Sos, MD Referring Physician: Aline August, MD  Reason for Consultation: Establishing goals of care  HPI/Patient Profile: 82 y.o. male  with past medical history of MCA stroke, epidural abscess, history of cerebral aneurysm repair in 2016, chronic subdural fluid collections (possible hygroma versus chronic subdurals), hypertension, hyperlipidemia, focal seizure, glaucoma, and chronic lower back pain admitted on 11/13/2022 with sudden onset left upper and lower extremity weakness, left-sided facial droop.  Patient diagnosed with acute right ischemic infarct.  Patient also with incidental finding of 13 mm pulmonary nodule suspicious for malignancy.  PMT consulted to discuss goals of care.  Clinical Assessment and Goals of Care: I have reviewed medical records including EPIC notes, labs and imaging, assessed the patient and then met with patient and his wife Damon Parrish to discuss diagnosis prognosis, [place], EOL wishes, disposition and options.  I introduced Palliative Medicine as specialized medical care for people living with serious illness. It focuses on providing relief from the symptoms and stress of a serious illness. The goal is to improve quality of life for both the patient and the family.  We discussed a brief life review of the patient.  Patient is wife shared they have been married for 11 years.  Patient's first wife passed away.  They both have children from previous marriages.  As far as functional and nutritional status patient and wife share he was very functional prior to admission.  No assisted device needed within the house but he did use a cane when he was out of the house.  Good appetite.   We discussed patient's current illness and what it means in the larger context of patient's on-going  co-morbidities.  We reviewed his stroke.  Reviewed working with therapy.  Reviewed possibility of inpatient rehab.  We also discussed his pulmonary nodule.  Patient and wife are thankful this was found.  They are planning for outpatient workup.  I attempted to elicit values and goals of care important to the patient.  Patient tell me his biggest goal is to get moving again.  He is motivated to work with therapy.  The difference between aggressive medical intervention and comfort care was considered in light of the patient's goals of care.   Patient tells me quality of life is important to him.  For now he would like to remain full code/full scope care.  He would consider changing this in the future and different circumstance.  Discussed with patient and wife the importance of continued conversation with family and the medical providers regarding overall plan of care and treatment options, ensuring decisions are within the context of the patients values and GOCs.    Questions and concerns were addressed. The family was encouraged to call with questions or concerns.  Primary Decision Maker PATIENT    SUMMARY OF RECOMMENDATIONS   -Patient motivated to work with therapy and "get moving again" -?  Potential move to CIR -Planning for outpatient workup of lung mass -Full code/full scope -No acute palliative needs identified  Code Status/Advance Care Planning: Full code     Primary Diagnoses: Present on Admission:  Acute CVA (cerebrovascular accident) ([place])  CVA (cerebral vascular accident) ([place])   I have reviewed the medical record, interviewed the patient and family, and examined the patient. The following aspects are pertinent.  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   GERD (gastroesophageal reflux disease)    H/O  cerebral aneurysm repair 2016   "put stent in"   High cholesterol    History of blood transfusion ~ 1950   "while in hospital w/pneumonia" (01/09/2018)   History of kidney stones    Hypertension    Nonruptured cerebral aneurysm    Pneumonia ~ 1950   Red man syndrome    Seizures ([place]) 12/27/2015; 12/28/2015   Stroke ([place]) 12/2015   denies residual on 01/09/2018)   Social History   Socioeconomic History   Marital status: Married    Spouse name: Not on file   Number of children: Not on file   Years of education: Not on file   Highest education level: Not on file  Occupational History   Not on file  Tobacco Use   Smoking status: Every Day    Packs/day: 1.00    Years: 61.00    Total pack years: 61.00    Types: Cigarettes   Smokeless tobacco: Never  Vaping Use   Vaping Use: Never used  Substance and Sexual Activity   Alcohol use: No    Alcohol/week: 0.0 standard drinks of alcohol   Drug use: No   Sexual activity: Not Currently  Other Topics Concern   Not on file  Social History Narrative   Not on file   Social Determinants of Health   Financial Resource Strain: Not on file  Food Insecurity: No Food Insecurity (11/14/2022)   Hunger Vital Sign    Worried About Running Out of Food in the Last Year: Never true    Ran Out of Food in the Last Year: Never true  Transportation Needs: No Transportation Needs (11/14/2022)   PRAPARE - Transportation    Lack of Transportation (Medical): No    Lack of Transportation (Non-Medical): No  Physical Activity: Not on file  Stress: Not on file  Social Connections: Not on file   Family History  Problem Relation Age of Onset   Lymphoma Mother    Dementia Mother    Aneurysm Father    Colon cancer Neg Hx    Scheduled Meds:  atorvastatin  40 mg Oral QHS   finasteride  5 mg Oral Daily   levETIRAcetam  500 mg Oral BID   Continuous Infusions: PRN Meds:.acetaminophen **OR** acetaminophen (TYLENOL) oral liquid 160 mg/5 mL **OR**  acetaminophen, ipratropium-albuterol Allergies  Allergen Reactions   Cephalosporins Rash    Full body rash with systemic symptoms   Vancomycin Rash    Full body rash with systemic symptoms   Review of Systems  Constitutional:  Positive for activity change.  Respiratory:  Positive for cough.     Physical Exam Constitutional:      General: He is not in acute distress.    Appearance: He is ill-appearing.  Pulmonary:     Effort: Pulmonary effort is normal.  Skin:    General: Skin is warm and dry.  Neurological:     Mental Status: He is alert and oriented to person, place, and time.  Psychiatric:        Mood and Affect: Mood normal.        Behavior: Behavior normal.     Vital Signs: BP 117/78 (BP Location: Left Arm)   Pulse 73   Temp 98.1 F (36.7 C) (Oral)  Resp 19   Ht 5' 9"$  (1.753 m)   Wt 81.1 kg   SpO2 92%   BMI 26.40 kg/m  Pain Scale: 0-10   Pain Score: 0-No pain   SpO2: SpO2: 92 % O2 Device:SpO2: 92 % O2 Flow Rate: .   IO: Intake/output summary:  Intake/Output Summary (Last 24 hours) at 11/14/2022 1339 Last data filed at 11/14/2022 0900 Gross per 24 hour  Intake 320 ml  Output 350 ml  Net -30 ml    LBM:   Baseline Weight: Weight: 81.1 kg Most recent weight: Weight: 81.1 kg     Palliative Assessment/Data: PPS 50%     *Please note that this is a verbal dictation therefore any spelling or grammatical errors are due to the "[employer]" system interpretation.  Juel Burrow, DNP, AGNP-C Palliative Medicine Team [phone] Pager: [phone]

## 2022-11-14 NOTE — Progress Notes (Signed)
Inpatient Rehab Admissions Coordinator :  Per therapy recommendations, patient was screened for CIR candidacy by Jaxston Chohan RN MSN.  At this time patient appears to be a potential candidate for CIR. I will place a rehab consult per protocol for full assessment. Please call me with any questions.  Sahian Kerney RN MSN Admissions Coordinator [phone]   

## 2022-11-14 NOTE — Progress Notes (Signed)
PROGRESS NOTE    Damon Parrish  [id number] DOB: 1940/12/23 DOA: 11/13/2022 PCP: Tobe Sos, MD   Brief Narrative:  82 y.o. male with medical history significant of right MCA stroke, epidural abscess, history of cerebral aneurysm repair in 2016, chronic subdural fluid collections (possible hygroma versus chronic subdurals), hypertension, hyperlipidemia, focal seizure, glaucoma, chronic lower back pain presented to the ED with sudden onset left upper and lower extremity weakness, left-sided facial droop.  Code stroke activated.  Out of tPA window at the time of arrival.  CT head without contrast showed mild increase in thickness of right subdural low-attenuation collection; no evidence of acute intracranial abnormality.  CTA of head and neck showed no acute large vessel occlusion.  Neurology was consulted.  MRI of the brain showed small area of acute/early subacute ischemia within the right coronary radiata with chronic right subdural hematoma.  Assessment & Plan:   Acute right ischemic infarct presenting with left-sided weakness Hyperlipidemia -Presented with left-sided weakness and left facial droop.  Imaging as above. -Neurology following. -PT/OT/SLP eval.  LDL 81.  A1c 6.1.  2D echo.  Start Lipitor 40 mg daily.  Seizure disorder -Resume home Keppra once home medications are verified  Incidental pulmonary nodule -CT chest without contrast showed 13 mm suspicious right solid pulmonary nodule within the upper lobe: Will need outpatient follow-up with either chest CT or PET/CT or tissue sampling  Hyponatremia -Mild.  Monitor oral intake  Chronic subdural hematoma -Neurology following  Hypertension -Monitor blood pressure.  Allow for permissive hypertension at this time.  Tobacco abuse, ongoing -Counseled regarding tobacco cessation  Goals of care -Overall prognosis is guarded to poor.  Palliative care consulted for goals of care discussion.   DVT prophylaxis:  SCDs Code Status: Full Family Communication: Wife at bedside Disposition Plan: Status is: Observation.  Pending PT eval and further neurology recommendations, patient might be discharged either later today or tomorrow.  Consultants: Neurology  Procedures: None  Antimicrobials: None   Subjective: Patient seen and examined at bedside.  Still complains of left-sided weakness.  No fever, seizures, vomiting reported.  Poor historian.  Objective: Vitals:   11/13/22 2353 11/14/22 0356 11/14/22 0754 11/14/22 1126  BP: (!) 143/87 (!) 144/84 124/80 117/78  Pulse:  77 71 73  Resp: 16 14 17 19  $ Temp:  98.5 F (36.9 C) 98.3 F (36.8 C) 98.1 F (36.7 C)  TempSrc:   Oral Oral  SpO2: 98% 96% 92% 92%  Weight: 81.1 kg     Height: 5' 9"$  (1.753 m)       Intake/Output Summary (Last 24 hours) at 11/14/2022 1132 Last data filed at 11/14/2022 0900 Gross per 24 hour  Intake 320 ml  Output 350 ml  Net -30 ml   Filed Weights   11/13/22 2353  Weight: 81.1 kg    Examination:  General exam: Appears calm and comfortable.  Elderly male lying in bed.  Slow to respond.  Poor historian. Respiratory system: Bilateral decreased breath sounds at bases with some scattered crackles Cardiovascular system: S1 & S2 heard, Rate controlled Gastrointestinal system: Abdomen is nondistended, soft and nontender. Normal bowel sounds heard. Extremities: No cyanosis, clubbing; trace lower extremity edema present Central nervous system: Awake, slow to respond.  Left-sided weakness present.  Skin: No rashes, lesions or ulcers Psychiatry: Flat affect.  Currently not agitated.    Data Reviewed: I have personally reviewed following labs and imaging studies  CBC: Recent Labs  Lab 11/13/22 1820 11/13/22 1829  WBC  9.7  --   NEUTROABS 5.6  --   HGB 15.3 15.3  HCT 43.6 45.0  MCV 98.2  --   PLT 254  --    Basic Metabolic Panel: Recent Labs  Lab 11/13/22 1820 11/13/22 1829  NA 131* 132*  K 4.3 4.3  CL 98  100  CO2 22  --   GLUCOSE 130* 125*  BUN 18 22  CREATININE 1.01 1.00  CALCIUM 9.5  --    GFR: Estimated Creatinine Clearance: 57.9 mL/min (by C-G formula based on SCr of 1 mg/dL). Liver Function Tests: Recent Labs  Lab 11/13/22 1820  AST 33  ALT 27  ALKPHOS 106  BILITOT 1.0  PROT 6.7  ALBUMIN 4.0   No results for input(s): "LIPASE", "AMYLASE" in the last 168 hours. No results for input(s): "AMMONIA" in the last 168 hours. Coagulation Profile: Recent Labs  Lab 11/13/22 1820  INR 1.1   Cardiac Enzymes: No results for input(s): "CKTOTAL", "CKMB", "CKMBINDEX", "TROPONINI" in the last 168 hours. BNP (last 3 results) No results for input(s): "PROBNP" in the last 8760 hours. HbA1C: Recent Labs    11/13/22 1820  HGBA1C 6.1*   CBG: Recent Labs  Lab 11/13/22 1817  GLUCAP 122*   Lipid Profile: Recent Labs    11/14/22 0018  CHOL 146  HDL 46  LDLCALC 81  TRIG 96  CHOLHDL 3.2   Thyroid Function Tests: No results for input(s): "TSH", "T4TOTAL", "FREET4", "T3FREE", "THYROIDAB" in the last 72 hours. Anemia Panel: No results for input(s): "VITAMINB12", "FOLATE", "FERRITIN", "TIBC", "IRON", "RETICCTPCT" in the last 72 hours. Sepsis Labs: No results for input(s): "PROCALCITON", "LATICACIDVEN" in the last 168 hours.  Recent Results (from the past 240 hour(s))  Resp panel by RT-PCR (RSV, Flu A&B, Covid) Anterior Nasal Swab     Status: None   Collection Time: 11/13/22 11:21 PM   Specimen: Anterior Nasal Swab  Result Value Ref Range Status   SARS Coronavirus 2 by RT PCR NEGATIVE NEGATIVE Final   Influenza A by PCR NEGATIVE NEGATIVE Final   Influenza B by PCR NEGATIVE NEGATIVE Final    Comment: (NOTE) The Xpert Xpress SARS-CoV-2/FLU/RSV plus assay is intended as an aid in the diagnosis of influenza from Nasopharyngeal swab specimens and should not be used as a sole basis for treatment. Nasal washings and aspirates are unacceptable for Xpert Xpress  SARS-CoV-2/FLU/RSV testing.  Fact Sheet for Patients: EntrepreneurPulse.com.au  Fact Sheet for Healthcare Providers: IncredibleEmployment.be  This test is not yet approved or cleared by the [place] FDA and has been authorized for detection and/or diagnosis of SARS-CoV-2 by FDA under an Emergency Use Authorization (EUA). This EUA will remain in effect (meaning this test can be used) for the duration of the COVID-19 declaration under Section 564(b)(1) of the Act, 21 U.S.C. section 360bbb-3(b)(1), unless the authorization is terminated or revoked.     Resp Syncytial Virus by PCR NEGATIVE NEGATIVE Final    Comment: (NOTE) Fact Sheet for Patients: EntrepreneurPulse.com.au  Fact Sheet for Healthcare Providers: IncredibleEmployment.be  This test is not yet approved or cleared by the [place] FDA and has been authorized for detection and/or diagnosis of SARS-CoV-2 by FDA under an Emergency Use Authorization (EUA). This EUA will remain in effect (meaning this test can be used) for the duration of the COVID-19 declaration under Section 564(b)(1) of the Act, 21 U.S.C. section 360bbb-3(b)(1), unless the authorization is terminated or revoked.  Performed at [facility], [address] [place], [place]  [address]          Radiology Studies: CT CHEST WO CONTRAST  Result Date: 11/13/2022 CLINICAL DATA:  Right upper lobe lung nodule on earlier CT angiography EXAM: CT CHEST WITHOUT CONTRAST TECHNIQUE: Multidetector CT imaging of the chest was performed following the standard protocol without IV contrast. RADIATION DOSE REDUCTION: This exam was performed according to the departmental dose-optimization program which includes automated exposure control, adjustment of the mA and/or kV according to patient size and/or use of iterative reconstruction technique. COMPARISON:  11/13/2022 FINDINGS:  Cardiovascular: Unenhanced imaging of the heart is unremarkable without pericardial effusion. Normal caliber of the thoracic aorta. Atherosclerosis of the aorta and coronary vasculature. Evaluation of the vascular lumen is limited without IV contrast. Mediastinum/Nodes: No enlarged mediastinal or axillary lymph nodes. Thyroid gland, trachea, and esophagus demonstrate no significant findings. Lungs/Pleura: No acute airspace disease, effusion, or pneumothorax. Scattered areas of scarring are seen at the lung bases. Upper lobe predominant emphysema. There is a slightly spiculated subpleural right upper lobe pulmonary nodule measuring 15 x 10 x 14 mm, reference image 24/4. Upper Abdomen: No acute abnormality. Musculoskeletal: No acute or destructive bony lesions. Chronic right-sided fourth through sixth rib fractures are noted. Reconstructed images demonstrate no additional findings. IMPRESSION: 1. 13 mm suspicious right solid pulmonary nodule within the upper lobe. If the patient would be a therapy candidate should neoplasm be detected, oncology referral may be useful. Per Fleischner Society Guidelines, consider a non-contrast Chest CT at 3 months, a PET/CT, or tissue sampling. These guidelines do not apply to immunocompromised patients and patients with cancer. Follow up in patients with significant comorbidities as clinically warranted. For lung cancer screening, adhere to Lung-RADS guidelines. Reference: Radiology. 2017; 284(1):228-43. 2. Aortic Atherosclerosis (ICD10-I70.0) and Emphysema (ICD10-J43.9). Electronically Signed   By: Randa Ngo M.D.   On: 11/13/2022 23:04   MR BRAIN WO CONTRAST  Result Date: 11/13/2022 CLINICAL DATA:  Acute neurologic deficit EXAM: MRI HEAD WITHOUT CONTRAST TECHNIQUE: Multiplanar, multiecho pulse sequences of the brain and surrounding structures were obtained without intravenous contrast. COMPARISON:  12/30/2015 FINDINGS: Brain: Small area of abnormal diffusion restriction  within the right corona radiata. Chronic right subdural hematoma. No acute hemorrhage. There is multifocal hyperintense T2-weighted signal within the white matter. Generalized volume loss. The midline structures are normal. Vascular: Major flow voids are preserved. Skull and upper cervical spine: Normal calvarium and skull base. Visualized upper cervical spine and soft tissues are normal. Sinuses/Orbits:No paranasal sinus fluid levels or advanced mucosal thickening. No mastoid or middle ear effusion. Normal orbits. IMPRESSION: 1. Small area of acute or early subacute ischemia within the right corona radiata. 2. Chronic right subdural hematoma. Electronically Signed   By: Ulyses Jarred M.D.   On: 11/13/2022 20:48   CT ANGIO HEAD NECK W WO CM (CODE STROKE)  Result Date: 11/13/2022 CLINICAL DATA:  Neuro deficit, acute, stroke suspected. Left arm numbness and weakness. EXAM: CT ANGIOGRAPHY HEAD AND NECK TECHNIQUE: Multidetector CT imaging of the head and neck was performed using the standard protocol during bolus administration of intravenous contrast. Multiplanar CT image reconstructions and MIPs were obtained to evaluate the vascular anatomy. Carotid stenosis measurements (when applicable) are obtained utilizing NASCET criteria, using the distal internal carotid diameter as the denominator. RADIATION DOSE REDUCTION: This exam was performed according to the departmental dose-optimization program which includes automated exposure control, adjustment of the mA and/or kV according to patient size and/or use of iterative reconstruction technique. CONTRAST:  68m OMNIPAQUE IOHEXOL 350 MG/ML SOLN COMPARISON:  Head CT same day FINDINGS: CTA NECK FINDINGS Aortic arch: Aortic atherosclerosis. Branching pattern is normal without origin stenosis. Right carotid system: Common carotid artery widely patent to the bifurcation. Mild calcified plaque at the carotid bifurcation but no stenosis. Cervical ICA widely patent. Left  carotid system: Common carotid artery widely patent to the bifurcation. Carotid bifurcation is normal. Cervical ICA is normal. Vertebral arteries: Both vertebral artery origins are widely patent. Both vertebral arteries appear normal through the cervical region to the foramen magnum. Skeleton: Chronic cervical spondylosis.  No acute finding. Other neck: No mass or lymphadenopathy. Upper chest: Emphysema and pulmonary scarring. Old rib fractures on the right. 11 mm nodule in the medial right upper lobe. This is new since 2017 and is worrisome for a possible lung cancer. Review of the MIP images confirms the above findings CTA HEAD FINDINGS Anterior circulation: Both internal carotid arteries are patent through the skull base and siphon regions. Ordinary siphon atherosclerosis. Carotid artery stent in the distal siphon to supraclinoid ICA on the right. No evidence of residual aneurysmal flow. The stent is patent. Flow is present in the anterior and middle cerebral arteries. No large vessel occlusion. Posterior circulation: Both vertebral arteries are patent through the foramen magnum to the basilar artery. No basilar stenosis. Superior cerebellar arteries and posterior cerebral arteries appear normal. Venous sinuses: Patent and normal. Anatomic variants: None significant. Review of the MIP images confirms the above findings IMPRESSION: 1. No acute large vessel occlusion. 2. Aortic atherosclerosis. 3. Mild atherosclerotic change at the right carotid bifurcation but no stenosis. 4. Right ICA stent in the distal siphon to supraclinoid ICA. No evidence of residual aneurysmal flow. 5. 11 mm nodule in the medial right upper lobe. This is new since 2017 and is worrisome for a possible lung cancer. Consider one of the following in 3 months for both low-risk and high-risk individuals: (a) repeat chest CT, (b) follow-up PET-CT, or (c) tissue sampling. This recommendation follows the consensus statement: Guidelines for  Management of Incidental Pulmonary Nodules Detected on CT Images: From the Fleischner Society 2017; Radiology 2017; 284:228-243. Because of the presence of emphysema and pulmonary scarring, this patient is certainly high risk. Because this was not present in 2017, I think proceeding to PET scan or tissue sampling could be preferable. Aortic Atherosclerosis (ICD10-I70.0). Electronically Signed   By: Nelson Chimes M.D.   On: 11/13/2022 18:59   CT HEAD CODE STROKE WO CONTRAST  Result Date: 11/13/2022 CLINICAL DATA:  Code stroke.  Neuro deficit, acute, stroke suspected EXAM: CT HEAD WITHOUT CONTRAST TECHNIQUE: Contiguous axial images were obtained from the base of the skull through the vertex without intravenous contrast. RADIATION DOSE REDUCTION: This exam was performed according to the departmental dose-optimization program which includes automated exposure control, adjustment of the mA and/or kV according to patient size and/or use of iterative reconstruction technique. COMPARISON:  CT head January 07, 2019. FINDINGS: Brain: Mild increase in thickness of a right subdural low-attenuation collection, measuring approximately 1.1 cm in thickness. Suspected trace left subdural low-attenuation fluid collection is unchanged. No evidence of acute large vascular territory infarct, or definitely acute hemorrhage. Mild associated mass effect without midline shift. Vascular: No hyperdense vessel identified. Right ICA stent. Calcific atherosclerosis. Skull: No acute fracture. Sinuses/Orbits: Fluid in left maxillary sinus. No acute orbital findings. Other: No mastoid effusions. ASPECTS [facility] Stroke Program Early CT Score) total score (0-10 with 10 being normal): 10. IMPRESSION: 1. Mild increase in thickness of a right subdural low-attenuation collection, measuring approximately  1.1 cm in thickness. Suspected trace left subdural low-attenuation fluid collection is unchanged. These most likely represent chronic subdural  hematomas or chronic hematomas. Mild associated mass effect without midline shift. 2. Otherwise, no evidence of acute intracranial abnormality. Code stroke imaging results were communicated on 11/13/2022 at 6:38 pm to provider Baghat via telephone, who verbally acknowledged these results. Electronically Signed   By: Margaretha Sheffield M.D.   On: 11/13/2022 18:40        Scheduled Meds: Continuous Infusions:        Aline August, MD [facility] 11/14/2022, 11:32 AM

## 2022-11-14 NOTE — Progress Notes (Signed)
  Transition of Care [facility]) Screening Note   Patient Details  Name: Damon Parrish Date of Birth: 03-04-1941   Transition of Care [facility]) CM/SW Contact:    Pollie Friar, RN Phone Number: 11/14/2022, 3:45 PM   Pt is from home with his spouse. Positive for stroke. CIR to eval for possible rehab admission. Transition of Care Department [facility]) has reviewed patient. We will continue to monitor patient advancement through interdisciplinary progression rounds. If new patient transition needs arise, please place a TOC consult.

## 2022-11-14 NOTE — Progress Notes (Signed)
Called by RN that family reported left hand shaking jerking spasm for the last 58mn. Came over to see pt and wife at the bedside, pt lying in bed, AAO x 3. No neuro change from this afternoon except left hand/finger involuntary movement, not consistent with tremor, semi-rhythmic, with finger flexion, no pain or AMS. Following simple commands. Per wife, pt previous seizure like activity was much more violent jerking than this. Will give ativan 213mIV and give keppra night dose earlier with long term EEG overnight to rule out seizure. If seizure present, will need increase keppra dose. Family's question answered.   JiRosalin HawkingMD PhD Stroke Neurology 11/14/2022 6:35 PM

## 2022-11-14 NOTE — Evaluation (Signed)
Physical Therapy Evaluation Patient Details Name: Damon Parrish MRN: [id number] DOB: [birth year] Today's Date: 11/14/2022  History of Present Illness  Pt is 82 yo male who presents on 11/13/22 with sudden onset LUE and LLE weakness with L facial droop. MRI showed R corona radiata CVA and chronic R SDH. 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 Pt from home with his wife where has been independent but with decreased functional mobility since he fell 10/03/22. Pt's L sided deficits have improved since episode yesterday however, despite relatively minor strength deficits he exhibits lack of neuromuscular control LLE and LUE with mobility.  This is making him a high fall risk with ambulation. He ambulated 80' with RW and min A +2 with LLE in ER and decreased awareness of this by pt. He is having difficulty maintaining balance even with use of RW and he was independent in his home with no AD PTA. Recommend  AIR level therapies to return to independence before d/c home. Pt currently with functional limitations due to the deficits listed below (see PT Problem List). Pt will benefit from skilled PT to increase their independence and safety with mobility to allow discharge to the venue listed below.          Recommendations for follow up therapy are one component of a multi-disciplinary discharge planning process, led by the attending physician.  Recommendations may be updated based on patient status, additional functional criteria and insurance authorization.  Follow Up Recommendations Acute inpatient rehab (3hours/day)      Assistance Recommended at Discharge Frequent or constant Supervision/Assistance  Patient can return home with the following  A lot  of help with walking and/or transfers;Help with stairs or ramp for entrance;Assist for transportation;Assistance with cooking/housework;A little help with bathing/dressing/bathroom    Equipment Recommendations None recommended by PT  Recommendations for Other Services  Rehab consult    Functional Status Assessment Patient has had a recent decline in their functional status and demonstrates the ability to make significant improvements in function in a reasonable and predictable amount of time.     Precautions / Restrictions Precautions Precautions: Fall Precaution Comments: fell 10/03/22 and hasn't really returned to baseline Restrictions Weight Bearing Restrictions: No Other Position/Activity Restrictions: resting R tremor at baseline      Mobility  Bed Mobility Overal bed mobility: Needs Assistance Bed Mobility: Supine to Sit     Supine to sit: Supervision     General bed mobility comments: pt able to come to EOB with increased time and cues for breathing    Transfers Overall transfer level: Needs assistance Equipment used: Rolling walker (2 wheels) Transfers: Sit to/from Stand Sit to Stand: Min assist           General transfer comment: min A to steady on L side. Stood more confidently after first sit>stand    Ambulation/Gait Ambulation/Gait assistance: +2 safety/equipment, Min assist Gait Distance (Feet): 60 Feet Assistive device: Rolling walker (2 wheels) Gait Pattern/deviations: Step-through pattern, Decreased step length - left, Decreased stance time - left Gait velocity: decreased Gait velocity interpretation: <1.8 ft/sec, indicate of risk for recurrent falls   General Gait Details: pt with ER L hip with difficulty keeping LLE within RW which is decreasing his safety with ambulation. He is not correcting this on his own but needing  verbal and occasional tactile cues to correct. Having increased difficulty with turning.  Stairs            Wheelchair  Mobility    Modified Rankin (Stroke Patients Only) Modified Rankin (Stroke Patients Only) Pre-Morbid Rankin Score: Slight disability Modified Rankin: Moderately severe disability     Balance Overall balance assessment: Needs assistance, History of Falls Sitting-balance support: Feet supported, No upper extremity supported Sitting balance-Leahy Scale: Fair Sitting balance - Comments: mild L lean, no LOB EOB Postural control: Left lateral lean Standing balance support: Bilateral upper extremity supported, During functional activity Standing balance-Leahy Scale: Poor Standing balance comment: L lean in standing with decreased control LLE. Needs min A for safety                             Pertinent Vitals/Pain Pain Assessment Pain Assessment: No/denies pain    Home Living Family/patient expects to be discharged to:: Private residence Living Arrangements: Spouse/significant other Available Help at Discharge: Family;Available 24 hours/day Type of Home: House Home Access: Stairs to enter Entrance Stairs-Rails: None Entrance Stairs-Number of Steps: 1   Home Layout: One level Home Equipment: Conservation officer, nature (2 wheels);Cane - single point;Shower seat - built in;Grab bars - tub/shower;Wheelchair - manual Additional Comments: retired from working at [employer] in [place] Prior Level of Function : Independent/Modified Independent;Driving             Mobility Comments: walk with SPC out of home, no AD in home       Hand Dominance   Dominant Hand:  (ambidexterous, writes with R, eats with L)    Extremity/Trunk Assessment   Upper Extremity Assessment Upper Extremity Assessment: Defer to OT evaluation    Lower Extremity Assessment Lower Extremity Assessment: LLE deficits/detail LLE Deficits / Details: hip flex 4/5, knee ext 4/5 but pt with decreased proprioception and neuromuscular control of LLE with mobility LLE Sensation: decreased  proprioception LLE Coordination: decreased gross motor;decreased fine motor    Cervical / Trunk Assessment Cervical / Trunk Assessment: Kyphotic  Communication   Communication: No difficulties  Cognition Arousal/Alertness: Awake/alert Behavior During Therapy: WFL for tasks assessed/performed Overall Cognitive Status: Impaired/Different from baseline Area of Impairment: Awareness, Safety/judgement                         Safety/Judgement: Decreased awareness of safety, Decreased awareness of deficits Awareness: Emergent   General Comments: pt knows he's not himself but decreased awareness of what exactly is wrong and seems to have some denial toward deficits. Follows commands with increased time        General Comments General comments (skin integrity, edema, etc.): wife present. She is elderly and can give him supervision but would not anticipate her being able to give him physical assist    Exercises     Assessment/Plan    PT Assessment Patient needs continued PT services  PT Problem List Decreased strength;Decreased activity tolerance;Decreased balance;Decreased mobility;Decreased coordination;Decreased cognition;Decreased knowledge of use of DME;Decreased safety awareness;Decreased knowledge of precautions       PT Treatment Interventions Gait training;DME instruction;Stair training;Functional mobility training;Therapeutic activities;Therapeutic exercise;Balance training;Patient/family education;Cognitive remediation;Neuromuscular re-education    PT Goals (Current goals can be found in the Care Plan section)  Acute Rehab PT Goals Patient Stated Goal: return home PT Goal Formulation: With patient/family Time For Goal Achievement: 11/28/22 Potential to Achieve Goals: Good    Frequency Min 4X/week  Co-evaluation               AM-PAC PT "6 Clicks" Mobility  Outcome Measure Help needed turning from your back to your side while in a flat bed without  using bedrails?: None Help needed moving from lying on your back to sitting on the side of a flat bed without using bedrails?: None Help needed moving to and from a bed to a chair (including a wheelchair)?: A Little Help needed standing up from a chair using your arms (e.g., wheelchair or bedside chair)?: A Little Help needed to walk in hospital room?: Total Help needed climbing 3-5 steps with a railing? : Total 6 Click Score: 16    End of Session Equipment Utilized During Treatment: Gait belt Activity Tolerance: Patient tolerated treatment well Patient left: in chair;with call bell/phone within reach;with family/visitor present;Other (comment) (OT present) Nurse Communication: Mobility status PT Visit Diagnosis: Unsteadiness on feet (R26.81);History of falling (Z91.81);Difficulty in walking, not elsewhere classified (R26.2)    Time: [id number] PT Time Calculation (min) (ACUTE ONLY): 37 min   Charges:   PT Evaluation $PT Eval Moderate Complexity: 1 Mod PT Treatments $Gait Training: 8-22 mins        Leighton Roach, PT  Acute Rehab Services Secure chat preferred Office [place] 11/14/2022, 12:10 PM

## 2022-11-14 NOTE — Evaluation (Signed)
Occupational Therapy Evaluation Patient Details Name: Damon Parrish MRN: [id number] DOB: 1941-02-23 Today's Date: 11/14/2022   History of Present Illness Pt is 82 yo male who presents on 11/13/22 with sudden onset LUE and LLE weakness with L facial droop. MRI showed R corona radiata CVA and chronic R SDH. 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  Admitted for above and presents with problem list below, including decreased activity tolerance, L sided coordination and weakness, and impaired balance.  Patient alert and oriented, follows commands with increased time but demonstrates decreased awareness/insight to deficits, decreased problem solving.  He completes ADLs with up to mod assist, transfers with min assist and functional mobility in room with min assist +2 safety.  Fatigues easily.  Believe he will best benefit from continued OT services acutely and after dc at AIR level rehab to optimize independence, safety and decrease risk of falls upon return home.      Recommendations for follow up therapy are one component of a multi-disciplinary discharge planning process, led by the attending physician.  Recommendations may be updated based on patient status, additional functional criteria and insurance authorization.   Follow Up Recommendations  Acute inpatient rehab (3hours/day)     Assistance Recommended at Discharge Frequent or constant Supervision/Assistance  Patient can return home with the following A lot of help with walking and/or transfers;A lot of help with bathing/dressing/bathroom;Assistance with cooking/housework;Direct supervision/assist for medications management;Direct supervision/assist for financial management;Assist for  transportation;Help with stairs or ramp for entrance    Functional Status Assessment  Patient has had a recent decline in their functional status and demonstrates the ability to make significant improvements in function in a reasonable and predictable amount of time.  Equipment Recommendations  BSC/3in1    Recommendations for Other Services Rehab consult     Precautions / Restrictions Precautions Precautions: Fall Precaution Comments: fell 10/03/22 and hasn't really returned to baseline Restrictions Weight Bearing Restrictions: No Other Position/Activity Restrictions: resting R tremor at baseline      Mobility Bed Mobility               General bed mobility comments: OOB upon entry    Transfers Overall transfer level: Needs assistance Equipment used: Rolling walker (2 wheels) Transfers: Sit to/from Stand Sit to Stand: Min assist           General transfer comment: min A to steady on L side.      Balance Overall balance assessment: Needs assistance, History of Falls Sitting-balance support: Feet supported, No upper extremity supported Sitting balance-Leahy Scale: Fair Sitting balance - Comments: mild L lean, no LOB EOB Postural control: Left lateral lean Standing balance support: Bilateral upper extremity supported, During functional activity Standing balance-Leahy Scale: Poor Standing balance comment: L lean in standing with decreased control LLE. Needs min A for safety                           ADL either performed or assessed with clinical judgement   ADL Overall ADL's : Needs assistance/impaired     Grooming: Minimal assistance;Standing           Upper Body Dressing : Minimal assistance;Sitting   Lower Body Dressing: Sit to/from stand;Moderate assistance   Toilet Transfer: Minimal assistance;Ambulation;Rolling walker (2  wheels)           Functional mobility during ADLs: Minimal assistance;Rolling walker (2 wheels) General ADL  Comments: +2 safety for functional mobiltiy     Vision   Vision Assessment?: No apparent visual deficits     Perception     Praxis      Pertinent Vitals/Pain       Hand Dominance  (ambidexterous, writes with R, eats with L)   Extremity/Trunk Assessment Upper Extremity Assessment Upper Extremity Assessment: LUE deficits/detail LUE Deficits / Details: mild decreased strength and coordination. LUE Sensation: decreased proprioception LUE Coordination: decreased fine motor;decreased gross motor   Lower Extremity Assessment Lower Extremity Assessment: Defer to PT evaluation LLE Deficits / Details: hip flex 4/5, knee ext 4/5 but pt with decreased proprioception and neuromuscular control of LLE with mobility LLE Sensation: decreased proprioception LLE Coordination: decreased gross motor;decreased fine motor   Cervical / Trunk Assessment Cervical / Trunk Assessment: Kyphotic   Communication Communication Communication: No difficulties   Cognition Arousal/Alertness: Awake/alert Behavior During Therapy: WFL for tasks assessed/performed Overall Cognitive Status: Impaired/Different from baseline Area of Impairment: Awareness, Safety/judgement, Problem solving                         Safety/Judgement: Decreased awareness of safety, Decreased awareness of deficits Awareness: Emergent Problem Solving: Slow processing, Requires verbal cues General Comments: pt alert and oriented. requires increased time to follow commands with decreased problem solving.  Decreaed awareness and insight to deficits.     General Comments  pt fatigues easily    Exercises     Shoulder Instructions      Home Living Family/patient expects to be discharged to:: Private residence Living Arrangements: Spouse/significant other Available Help at Discharge: Family;Available 24 hours/day Type of Home: House Home Access: Stairs to enter [employer] of Steps: 1 Entrance Stairs-Rails:  None Home Layout: One level     Bathroom Shower/Tub: Occupational psychologist: Standard     Home Equipment: Conservation officer, nature (2 wheels);Cane - single point;Shower seat - built in;Grab bars - tub/shower;Wheelchair - manual   Additional Comments: retired from working at [employer] in [place]      Prior Functioning/Environment Prior Level of Function : Independent/Modified Independent;Driving             Mobility Comments: walk with SPC out of home, no AD in home ADLs Comments: indepedent ADls, driving but spouse manages meds and finances        OT Problem List: Decreased strength;Decreased activity tolerance;Impaired balance (sitting and/or standing);Decreased coordination;Decreased cognition;Decreased safety awareness;Decreased knowledge of use of DME or AE;Decreased knowledge of precautions;Impaired UE functional use      OT Treatment/Interventions: Self-care/ADL training;Therapeutic exercise;DME and/or AE instruction;Therapeutic activities;Patient/family education;Balance training;Cognitive remediation/compensation;Neuromuscular education    OT Goals(Current goals can be found in the care plan section) Acute Rehab OT Goals Patient Stated Goal: home OT Goal Formulation: With patient Time For Goal Achievement: 11/28/22 Potential to Achieve Goals: Good  OT Frequency: Min 2X/week    Co-evaluation              AM-PAC OT "6 Clicks" Daily Activity     Outcome Measure Help from another person eating meals?: A Little Help from another person taking care of personal grooming?: A Little Help from another person toileting, which includes using toliet, bedpan, or urinal?: A Lot Help from another person bathing (including washing, rinsing, drying)?: A Lot Help from another person to put on and taking  off regular upper body clothing?: A Little Help from another person to put on and taking off regular lower body clothing?: A Lot 6 Click Score: 15   End of Session  Equipment Utilized During Treatment: Gait belt;Rolling walker (2 wheels) Nurse Communication: Mobility status  Activity Tolerance: Patient tolerated treatment well Patient left: in chair;with call bell/phone within reach;with chair alarm set;with family/visitor present  OT Visit Diagnosis: Other abnormalities of gait and mobility (R26.89);History of falling (Z91.81);Other symptoms and signs involving cognitive function;Other symptoms and signs involving the nervous system (R29.898)                Time: [id number] OT Time Calculation (min): 23 min Charges:  OT General Charges $OT Visit: 1 Visit OT Evaluation $OT Eval Moderate Complexity: 1 Mod  Jolaine Artist, OT Acute Rehabilitation Services Office [phone]   Delight Stare 11/14/2022, 1:24 PM

## 2022-11-14 NOTE — Progress Notes (Signed)
Bilateral lower extremity venous duplex has been completed. Preliminary results can be found in CV Proc through chart review.   11/14/22 12:37 PM Damon Parrish RVT

## 2022-11-15 ENCOUNTER — Encounter (HOSPITAL_COMMUNITY)
Admission: EM | Disposition: A | Discharge status: Rehabilitation Facility | Payer: Self-pay | Source: Home / Self Care | Attending: Internal Medicine

## 2022-11-15 ENCOUNTER — Inpatient Hospital Stay (HOSPITAL_COMMUNITY)
Admission: RE | Admit: 2022-11-15 | Discharge: 2022-11-21 | DRG: 057 | Disposition: A | Discharge status: Home / Self Care | Payer: Medicare Other | Source: Intra-hospital | Attending: Physical Medicine & Rehabilitation | Admitting: Physical Medicine & Rehabilitation

## 2022-11-15 ENCOUNTER — Encounter (HOSPITAL_COMMUNITY): Payer: Self-pay | Admitting: Physical Medicine & Rehabilitation

## 2022-11-15 ENCOUNTER — Other Ambulatory Visit: Payer: Self-pay

## 2022-11-15 DIAGNOSIS — Z8679 Personal history of other diseases of the circulatory system: Secondary | ICD-10-CM

## 2022-11-15 DIAGNOSIS — Z79899 Other long term (current) drug therapy: Secondary | ICD-10-CM | POA: Diagnosis not present

## 2022-11-15 DIAGNOSIS — M1990 Unspecified osteoarthritis, unspecified site: Secondary | ICD-10-CM | POA: Diagnosis present

## 2022-11-15 DIAGNOSIS — M545 Low back pain, unspecified: Secondary | ICD-10-CM | POA: Diagnosis present

## 2022-11-15 DIAGNOSIS — I69393 Ataxia following cerebral infarction: Secondary | ICD-10-CM

## 2022-11-15 DIAGNOSIS — Z7982 Long term (current) use of aspirin: Secondary | ICD-10-CM

## 2022-11-15 DIAGNOSIS — Z807 Family history of other malignant neoplasms of lymphoid, hematopoietic and related tissues: Secondary | ICD-10-CM | POA: Diagnosis not present

## 2022-11-15 DIAGNOSIS — Z881 Allergy status to other antibiotic agents status: Secondary | ICD-10-CM

## 2022-11-15 DIAGNOSIS — I69354 Hemiplegia and hemiparesis following cerebral infarction affecting left non-dominant side: Principal | ICD-10-CM

## 2022-11-15 DIAGNOSIS — Z961 Presence of intraocular lens: Secondary | ICD-10-CM | POA: Diagnosis present

## 2022-11-15 DIAGNOSIS — R569 Unspecified convulsions: Secondary | ICD-10-CM | POA: Diagnosis not present

## 2022-11-15 DIAGNOSIS — H409 Unspecified glaucoma: Secondary | ICD-10-CM | POA: Diagnosis present

## 2022-11-15 DIAGNOSIS — G8929 Other chronic pain: Secondary | ICD-10-CM | POA: Diagnosis present

## 2022-11-15 DIAGNOSIS — D181 Lymphangioma, any site: Secondary | ICD-10-CM | POA: Diagnosis present

## 2022-11-15 DIAGNOSIS — Z9841 Cataract extraction status, right eye: Secondary | ICD-10-CM

## 2022-11-15 DIAGNOSIS — R911 Solitary pulmonary nodule: Secondary | ICD-10-CM | POA: Diagnosis present

## 2022-11-15 DIAGNOSIS — I63511 Cerebral infarction due to unspecified occlusion or stenosis of right middle cerebral artery: Secondary | ICD-10-CM | POA: Diagnosis not present

## 2022-11-15 DIAGNOSIS — Z716 Tobacco abuse counseling: Secondary | ICD-10-CM | POA: Diagnosis not present

## 2022-11-15 DIAGNOSIS — G40909 Epilepsy, unspecified, not intractable, without status epilepticus: Secondary | ICD-10-CM

## 2022-11-15 DIAGNOSIS — Z87442 Personal history of urinary calculi: Secondary | ICD-10-CM | POA: Diagnosis not present

## 2022-11-15 DIAGNOSIS — Z9842 Cataract extraction status, left eye: Secondary | ICD-10-CM

## 2022-11-15 DIAGNOSIS — Z741 Need for assistance with personal care: Secondary | ICD-10-CM | POA: Diagnosis present

## 2022-11-15 DIAGNOSIS — I10 Essential (primary) hypertension: Secondary | ICD-10-CM | POA: Diagnosis present

## 2022-11-15 DIAGNOSIS — E7800 Pure hypercholesterolemia, unspecified: Secondary | ICD-10-CM | POA: Diagnosis present

## 2022-11-15 DIAGNOSIS — F17210 Nicotine dependence, cigarettes, uncomplicated: Secondary | ICD-10-CM | POA: Diagnosis present

## 2022-11-15 DIAGNOSIS — K5900 Constipation, unspecified: Secondary | ICD-10-CM | POA: Diagnosis present

## 2022-11-15 DIAGNOSIS — N400 Enlarged prostate without lower urinary tract symptoms: Secondary | ICD-10-CM | POA: Diagnosis present

## 2022-11-15 DIAGNOSIS — I639 Cerebral infarction, unspecified: Secondary | ICD-10-CM | POA: Diagnosis not present

## 2022-11-15 DIAGNOSIS — K5901 Slow transit constipation: Secondary | ICD-10-CM | POA: Diagnosis not present

## 2022-11-15 HISTORY — PX: LOOP RECORDER INSERTION: EP1214

## 2022-11-15 LAB — LEVETIRACETAM LEVEL: Levetiracetam Lvl: <2 ug/mL — ABNORMAL LOW (ref 10.0–40.0)

## 2022-11-15 SURGERY — LOOP RECORDER INSERTION

## 2022-11-15 MED ORDER — OCUVITE-LUTEIN PO CAPS
1.0000 | ORAL_CAPSULE | Freq: Every day | ORAL | Status: DC
  Filled 2022-11-15: qty 1

## 2022-11-15 MED ORDER — ENOXAPARIN SODIUM 40 MG/0.4ML IJ SOSY
40.0000 mg | PREFILLED_SYRINGE | INTRAMUSCULAR | Status: DC
  Administered 2022-11-16 – 2022-11-20 (×5): 40 mg via SUBCUTANEOUS
  Filled 2022-11-15 (×5): qty 0.4

## 2022-11-15 MED ORDER — ATORVASTATIN CALCIUM 80 MG PO TABS: 80.0000 mg | ORAL_TABLET | Freq: Every day | ORAL | Status: DC

## 2022-11-15 MED ORDER — ALUM & MAG HYDROXIDE-SIMETH 200-200-20 MG/5ML PO SUSP: 30.0000 mL | ORAL | Status: DC | PRN

## 2022-11-15 MED ORDER — TIMOLOL MALEATE 0.25 % OP SOLN
1.0000 [drp] | Freq: Every day | OPHTHALMIC | Status: DC
  Administered 2022-11-16 – 2022-11-21 (×6): 1 [drp] via OPHTHALMIC
  Filled 2022-11-15: qty 5

## 2022-11-15 MED ORDER — PREDNISOLONE ACETATE 1 % OP SUSP
1.0000 [drp] | Freq: Every day | OPHTHALMIC | Status: DC
  Administered 2022-11-16 – 2022-11-21 (×6): 1 [drp] via OPHTHALMIC
  Filled 2022-11-15: qty 5

## 2022-11-15 MED ORDER — SENNOSIDES-DOCUSATE SODIUM 8.6-50 MG PO TABS
2.0000 | ORAL_TABLET | ORAL | Status: AC
  Administered 2022-11-15: 2 via ORAL
  Filled 2022-11-15: qty 2

## 2022-11-15 MED ORDER — PROCHLORPERAZINE MALEATE 5 MG PO TABS: 5.0000 mg | ORAL_TABLET | Freq: Four times a day (QID) | ORAL | Status: DC | PRN

## 2022-11-15 MED ORDER — MAGNESIUM HYDROXIDE 400 MG/5ML PO SUSP: 30.0000 mL | Freq: Every day | ORAL | Status: DC | PRN

## 2022-11-15 MED ORDER — CLOPIDOGREL BISULFATE 75 MG PO TABS
75.0000 mg | ORAL_TABLET | Freq: Every day | ORAL | Status: DC
  Administered 2022-11-16 – 2022-11-21 (×6): 75 mg via ORAL
  Filled 2022-11-15 (×6): qty 1

## 2022-11-15 MED ORDER — ASPIRIN 81 MG PO TBEC: 81.0000 mg | DELAYED_RELEASE_TABLET | Freq: Every day | ORAL | Status: DC

## 2022-11-15 MED ORDER — LEVETIRACETAM 500 MG PO TABS
500.0000 mg | ORAL_TABLET | Freq: Two times a day (BID) | ORAL | Status: DC
  Administered 2022-11-15 – 2022-11-21 (×12): 500 mg via ORAL
  Filled 2022-11-15 (×12): qty 1

## 2022-11-15 MED ORDER — ACETAMINOPHEN 325 MG PO TABS: 325.0000 mg | ORAL_TABLET | ORAL | Status: DC | PRN

## 2022-11-15 MED ORDER — FLEET ENEMA 7-19 GM/118ML RE ENEM: 1.0000 | ENEMA | Freq: Once | RECTAL | Status: DC | PRN

## 2022-11-15 MED ORDER — METHOCARBAMOL 500 MG PO TABS: 500.0000 mg | ORAL_TABLET | Freq: Four times a day (QID) | ORAL | Status: DC | PRN

## 2022-11-15 MED ORDER — CLOPIDOGREL BISULFATE 75 MG PO TABS: 75.0000 mg | ORAL_TABLET | Freq: Every day | ORAL | Status: DC

## 2022-11-15 MED ORDER — TRAZODONE HCL 50 MG PO TABS
25.0000 mg | ORAL_TABLET | Freq: Every evening | ORAL | Status: DC | PRN
  Administered 2022-11-19: 25 mg via ORAL
  Administered 2022-11-20: 50 mg via ORAL
  Filled 2022-11-15 (×3): qty 1

## 2022-11-15 MED ORDER — LATANOPROST 0.005 % OP SOLN
1.0000 [drp] | Freq: Every day | OPHTHALMIC | Status: DC
  Administered 2022-11-15 – 2022-11-20 (×6): 1 [drp] via OPHTHALMIC
  Filled 2022-11-15: qty 2.5

## 2022-11-15 MED ORDER — ATORVASTATIN CALCIUM 80 MG PO TABS
80.0000 mg | ORAL_TABLET | Freq: Every day | ORAL | Status: DC
  Administered 2022-11-15 – 2022-11-20 (×6): 80 mg via ORAL
  Filled 2022-11-15 (×6): qty 1

## 2022-11-15 MED ORDER — LIDOCAINE-EPINEPHRINE 1 %-1:100000 IJ SOLN
INTRAMUSCULAR | Status: AC
  Filled 2022-11-15: qty 1

## 2022-11-15 MED ORDER — SORBITOL 70 % SOLN: 30.0000 mL | Freq: Every day | Status: DC | PRN

## 2022-11-15 MED ORDER — TAMSULOSIN HCL 0.4 MG PO CAPS: 0.4000 mg | ORAL_CAPSULE | Freq: Every day | ORAL | Status: DC

## 2022-11-15 MED ORDER — UMECLIDINIUM BROMIDE 62.5 MCG/ACT IN AEPB
1.0000 | INHALATION_SPRAY | Freq: Every day | RESPIRATORY_TRACT | Status: DC
  Administered 2022-11-16 – 2022-11-21 (×6): 1 via RESPIRATORY_TRACT
  Filled 2022-11-15: qty 7

## 2022-11-15 MED ORDER — BRIMONIDINE TARTRATE 0.2 % OP SOLN
1.0000 [drp] | Freq: Two times a day (BID) | OPHTHALMIC | Status: DC
  Administered 2022-11-15 – 2022-11-21 (×12): 1 [drp] via OPHTHALMIC
  Filled 2022-11-15: qty 5

## 2022-11-15 MED ORDER — ASPIRIN 81 MG PO TBEC
81.0000 mg | DELAYED_RELEASE_TABLET | Freq: Every day | ORAL | Status: DC
  Administered 2022-11-16 – 2022-11-21 (×6): 81 mg via ORAL
  Filled 2022-11-15 (×6): qty 1

## 2022-11-15 MED ORDER — PROCHLORPERAZINE EDISYLATE 10 MG/2ML IJ SOLN: 5.0000 mg | Freq: Four times a day (QID) | INTRAMUSCULAR | Status: DC | PRN

## 2022-11-15 MED ORDER — FLUTICASONE FUROATE-VILANTEROL 100-25 MCG/ACT IN AEPB
1.0000 | INHALATION_SPRAY | Freq: Every day | RESPIRATORY_TRACT | Status: DC
  Administered 2022-11-16 – 2022-11-21 (×6): 1 via RESPIRATORY_TRACT
  Filled 2022-11-15: qty 28

## 2022-11-15 MED ORDER — LIDOCAINE-EPINEPHRINE 1 %-1:100000 IJ SOLN
INTRAMUSCULAR | Status: DC | PRN
  Administered 2022-11-15: 2 mL

## 2022-11-15 MED ORDER — PROSIGHT PO TABS
1.0000 | ORAL_TABLET | Freq: Every day | ORAL | Status: DC
  Administered 2022-11-16 – 2022-11-21 (×6): 1 via ORAL
  Filled 2022-11-15 (×6): qty 1

## 2022-11-15 MED ORDER — ENOXAPARIN SODIUM 40 MG/0.4ML IJ SOSY: 40.0000 mg | PREFILLED_SYRINGE | INTRAMUSCULAR | Status: DC

## 2022-11-15 MED ORDER — PROCHLORPERAZINE 25 MG RE SUPP: 12.5000 mg | Freq: Four times a day (QID) | RECTAL | Status: DC | PRN

## 2022-11-15 MED ORDER — BRINZOLAMIDE 1 % OP SUSP
1.0000 [drp] | Freq: Two times a day (BID) | OPHTHALMIC | Status: DC
  Administered 2022-11-15 – 2022-11-21 (×12): 1 [drp] via OPHTHALMIC
  Filled 2022-11-15: qty 10

## 2022-11-15 MED ORDER — FINASTERIDE 5 MG PO TABS
5.0000 mg | ORAL_TABLET | Freq: Every day | ORAL | Status: DC
  Administered 2022-11-16 – 2022-11-21 (×6): 5 mg via ORAL
  Filled 2022-11-15 (×6): qty 1

## 2022-11-15 MED ORDER — ORAL CARE MOUTH RINSE: 15.0000 mL | OROMUCOSAL | Status: DC | PRN

## 2022-11-15 SURGICAL SUPPLY — 2 items
MONITOR REVEAL LINQ II (Prosthesis & Implant Heart) IMPLANT
PACK LOOP INSERTION (CUSTOM PROCEDURE TRAY) ×1 IMPLANT

## 2022-11-15 NOTE — Progress Notes (Signed)
Pt's wife requests that he not be given any more ativan, states that he was agitated all night, trying to get out of bed, etc.

## 2022-11-15 NOTE — Consult Note (Signed)
ELECTROPHYSIOLOGY CONSULT NOTE  Patient ID: Damon Parrish MRN: [id number], DOB/AGE: [birth year]   Admit date: 11/13/2022 Date of Consult: 11/15/2022  Primary Physician: Tobe Sos, MD Primary Cardiologist: none Reason for Consultation: Cryptogenic stroke - recommendations regarding Implantable Loop Recorder, requested by Dr. Erlinda Hong  History of Present Illness Damon Parrish was admitted on 11/13/2022 with L sided weakness, found with stroke.    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  he has undergone workup for stroke including echocardiogram and carotid dopplers.  The patient has been monitored on telemetry which has demonstrated sinus rhythm with no arrhythmias.  Neurology has deferred TEE  He previously wore a monitor after his last stroke with no AFib   Echocardiogram this admission demonstrated    1. Left ventricular ejection fraction, by estimation, is 60 to 65%. Left  ventricular ejection fraction by 3D volume is 64 %. The left ventricle has  normal function. The left ventricle has no regional wall motion  abnormalities. There is mild left  ventricular hypertrophy. Left ventricular diastolic parameters are  consistent with Grade I diastolic dysfunction (impaired relaxation).   2. Right ventricular systolic function is normal. The right ventricular  size is mildly enlarged. There is normal pulmonary artery systolic  pressure. The estimated right ventricular systolic pressure is [id number] mmHg.   3. The mitral valve is normal in structure. No evidence of mitral valve  regurgitation. No evidence of mitral stenosis.   4. The aortic valve was not well visualized. Aortic valve regurgitation  is not visualized. No aortic stenosis is  present.   5. The inferior vena cava is normal in size with <50% respiratory  variability, suggesting right atrial pressure of 8 mmHg.    Lab work is reviewed.   Prior to admission, the patient denies chest pain, shortness of breath, dizziness, palpitations, or syncope.  They are recovering from their stroke with plans to CIR  at discharge.   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   GERD (gastroesophageal reflux disease)    H/O cerebral aneurysm repair 2016   "put stent in"   High cholesterol    History of blood transfusion ~ 1950   "while in hospital w/pneumonia" (01/09/2018)   History of kidney stones    Hypertension    Nonruptured cerebral aneurysm    Pneumonia ~ 1950   Red man syndrome    Seizures ([place]) 12/27/2015; 12/28/2015   Stroke ([place]) 12/2015   denies residual on 01/09/2018)     Surgical History:  Past Surgical History:  Procedure Laterality Date   CATARACT EXTRACTION W/ INTRAOCULAR LENS  IMPLANT, BILATERAL Bilateral    COLONOSCOPY     2013 per patient: done in [place], normal, next colonoscopy due in 10 years   FOOT FRACTURE SURGERY Left    "pins, etc. in there"   [place] N/A 05/04/2015   Procedure: Pipeline Embolization;  Surgeon: Consuella Lose, MD;  Location: [facility];  Service: Radiology;  Laterality: N/A;   RADIOLOGY WITH ANESTHESIA N/A 05/31/2015   Procedure: Pipeline Embolization;  Surgeon: Consuella Lose, MD;  Location: [facility];  Service: Radiology;  Laterality: N/A;     Medications Prior to Admission  Medication Sig Dispense Refill Last Dose   amLODipine (NORVASC) 10 MG tablet Take 10 mg by mouth daily.   11/13/2022   Ascorbic Acid (VITAMIN C) 1000 MG tablet Take 1,000 mg by mouth daily.   11/13/2022   aspirin 325 MG tablet Take 325 mg by mouth  daily.      atorvastatin (LIPITOR) 40 MG tablet Take 40 mg by mouth daily.   11/13/2022   bimatoprost (LUMIGAN) 0.03 % ophthalmic solution Place 1 drop into the right eye at bedtime.   11/12/2022   Brinzolamide-Brimonidine (SIMBRINZA) 1-0.2 % SUSP Place 1 drop into both eyes 2 (two) times daily.    11/13/2022   Cholecalciferol (VITAMIN D-1000 MAX ST) 1000 units tablet Take 1 tablet by mouth daily.   11/13/2022   finasteride (PROSCAR) 5 MG tablet Take 5 mg by mouth daily.      levETIRAcetam (KEPPRA) 500 MG tablet Take 1 tablet (500 mg total) by mouth 2 (two) times daily. 60 tablet 1 11/13/2022   Multiple Vitamin (MULTIVITAMIN) capsule Take 1 capsule by mouth daily.   11/13/2022   multivitamin-lutein (OCUVITE-LUTEIN) CAPS capsule Take 1 capsule by mouth daily.  0 11/13/2022   prednisoLONE acetate (PRED FORTE) 1 % ophthalmic suspension Place 1 drop into the left eye daily.   11/13/2022   timolol (TIMOPTIC) 0.25 % ophthalmic solution Place 1 drop into the right eye daily.   11/13/2022   TRELEGY ELLIPTA 100-62.5-25 MCG/ACT AEPB Inhale 1 puff into the lungs daily.   11/13/2022   vitamin E 400 UNIT capsule Take 400 Units by mouth daily.   11/13/2022   [DISCONTINUED] tamsulosin (FLOMAX) 0.4 MG CAPS capsule Take 0.4 mg by mouth daily.   11/13/2022    Inpatient Medications:   aspirin EC  81 mg Oral Daily   atorvastatin  80 mg Oral QHS   clopidogrel  75 mg Oral Daily   enoxaparin (LOVENOX) injection  40 mg Subcutaneous Q24H   finasteride  5 mg Oral Daily   levETIRAcetam  500 mg Oral BID    Allergies:  Allergies  Allergen Reactions   Cephalosporins Rash    Full body rash with systemic symptoms   Vancomycin Rash    Full body rash with systemic symptoms    Social History   Socioeconomic History   Marital status: Married    Spouse name: Not on file   Number of children: Not on file   Years of education: Not on file   Highest education level: Not on file  Occupational History   Not on file  Tobacco  Use   Smoking status: Every Day    Packs/day: 1.00    Years: 61.00    Total pack years: 61.00    Types: Cigarettes   Smokeless tobacco: Never  Vaping Use   Vaping Use: Never used  Substance and Sexual Activity   Alcohol use: No    Alcohol/week: 0.0 standard drinks of alcohol   Drug use: No   Sexual activity: Not Currently  Other Topics Concern   Not on file  Social History Narrative   Not on file   Social Determinants of Health   Financial Resource Strain: Not on file  Food Insecurity: No Food Insecurity (11/14/2022)   Hunger Vital Sign    Worried About Running Out of Food in the Last Year: Never true    Ran Out of Food in the Last  Year: Never true  Transportation Needs: No Transportation Needs (11/14/2022)   PRAPARE - Hydrologist (Medical): No    Lack of Transportation (Non-Medical): No  Physical Activity: Not on file  Stress: Not on file  Social Connections: Not on file  Intimate Partner Violence: Not At Risk (11/14/2022)   Humiliation, Afraid, Rape, and Kick questionnaire    Fear of Current or Ex-Partner: No    Emotionally Abused: No    Physically Abused: No    Sexually Abused: No     Family History  Problem Relation Age of Onset   Lymphoma Mother    Dementia Mother    Aneurysm Father    Colon cancer Neg Hx       Review of Systems: All other systems reviewed and are otherwise negative except as noted above.  Physical Exam: Vitals:   11/14/22 2326 11/15/22 0345 11/15/22 0812 11/15/22 1218  BP: 126/74 139/76 136/72 125/71  Pulse: 65 64 65 62  Resp: '18 16 19 16  '$ Temp: 99.1 F (37.3 C) 98.9 F (37.2 C) 98.9 F (37.2 C) 97.7 F (36.5 C)  TempSrc: Axillary Axillary Oral Oral  SpO2: 93% 96% 95% 96%  Weight:      Height:        GEN- The patient is well appearing, alert and oriented x 3 today.   Head- normocephalic, atraumatic Eyes-  Sclera clear, conjunctiva pink Ears- hearing intact Oropharynx- clear Neck- supple Lungs-  CTA b/l, normal work of breathing Heart- RRR, no murmurs, rubs or gallops  GI- soft, NT, ND Extremities- no clubbing, cyanosis, or edema MS- no significant deformity or atrophy Skin- no rash or lesion Psych- euthymic mood, full affect   Labs:   Lab Results  Component Value Date   WBC 9.7 11/13/2022   HGB 15.3 11/13/2022   HCT 45.0 11/13/2022   MCV 98.2 11/13/2022   PLT 254 11/13/2022    Recent Labs  Lab 11/13/22 1820 11/13/22 1829  NA 131* 132*  K 4.3 4.3  CL 98 100  CO2 22  --   BUN 18 22  CREATININE 1.01 1.00  CALCIUM 9.5  --   PROT 6.7  --   BILITOT 1.0  --   ALKPHOS 106  --   ALT 27  --   AST 33  --   GLUCOSE 130* 125*   Lab Results  Component Value Date   CKTOTAL 21 (L) 01/16/2018   Lab Results  Component Value Date   CHOL 146 11/14/2022   CHOL 133 12/31/2015   Lab Results  Component Value Date   HDL 46 11/14/2022   HDL 37 (L) 12/31/2015   Lab Results  Component Value Date   LDLCALC 81 11/14/2022   LDLCALC 80 12/31/2015   Lab Results  Component Value Date   TRIG 96 11/14/2022   TRIG 79 12/31/2015   Lab Results  Component Value Date   CHOLHDL 3.2 11/14/2022   CHOLHDL 3.6 12/31/2015   No results found for: "LDLDIRECT"  No results found for: "DDIMER"   Radiology/Studies:  Overnight EEG with video Result Date: 11/15/2022 Damon Havens, MD     11/15/2022  9:43 AM Patient Name: Damon Parrish MRN: [id number] Epilepsy Attending: Lora Parrish Referring Physician/Provider: Rosalin Hawking, MD Duration: 11/14/2022 2314 to 11/15/2022 0930 Patient history: 82yo M with left hand shaking jerking spasm for the last 29mn. EEG to evaluate for seizure Level of alertness: Awake, asleep AEDs during EEG study: LEV  Technical aspects: This EEG study was done with scalp electrodes positioned according to the 10-20 International system of electrode placement. Electrical activity was reviewed with band pass filter of 1-'70Hz'$ , sensitivity of 7 uV/mm, display speed of  34m/sec with a '60Hz'$  notched filter applied as appropriate. EEG data were recorded continuously and digitally stored.  Video monitoring was available and reviewed as appropriate. Description: The posterior dominant rhythm consists of 9-10 Hz activity of moderate voltage (25-35 uV) seen predominantly in posterior head regions, symmetric and reactive to eye opening and eye closing. Sleep was characterized by vertex waves, sleep spindles (12 to 14 Hz), maximal frontocentral region. EEG showed intermittent 3 to 6 Hz theta-delta slowing in right hemisphere. Generalized low amplitude 12-'15hz'$  beta activity was also noted. Hyperventilation and photic stimulation were not performed.   ABNORMALITY - Intermittent slow, right hemisphere IMPRESSION: This study is suggestive of cortical dysfunction arising from right hemisphere likely secondary to underlying structural abnormality. No seizures or epileptiform discharges were seen throughout the recording. Please note lack of epileptiform activity during interictal EEG does not exclude the diagnosis of epilepsy. Priyanka O Yadav       VAS [place]OWER EXTREMITY VENOUS (DVT) Result Date: 11/14/2022  Lower Venous DVT Study Patient Name:  Damon Parrish Date of Exam:   11/14/2022 Medical Rec #: [id number]     Accession #:    [id number] of Birth: 3December 10, 1942      Patient Gender: M Patient Age:   89years [facility]      [facility] [place]OWER EXTREMITY VENOUS (DVT) Referring Phys: JCornelius MorasXU --------------------------------------------------------------------------------  Indications: Stroke.  Risk Factors: None identified. Comparison Study: No prior studies. Performing Technologist: GOliver HumRVT  Examination Guidelines: A complete evaluation includes B-mode imaging, spectral Doppler, color Doppler, and power Doppler as needed of all accessible portions of each vessel. Bilateral testing is considered an integral part of a complete examination.  Limited examinations for reoccurring indications may be performed as noted. The reflux portion of the exam is performed with the patient in reverse Trendelenburg.  Summary: RIGHT: - There is no evidence of deep vein thrombosis in the lower extremity.  - No cystic structure found in the popliteal fossa.  LEFT: - There is no evidence of deep vein thrombosis in the lower extremity.  - No cystic structure found in the popliteal fossa.  *See table(s) above for measurements and observations. Electronically signed by CMonica MartinezMD on 11/14/2022 at 2:26:37 PM.    Final    CT CHEST WO CONTRAST Result Date: 11/13/2022 CLINICAL DATA:  Right upper lobe lung nodule on earlier CT angiography EXAM: CT CHEST WITHOUT CONTRAST TECHNIQUE: Multidetector CT imaging of the chest was performed following the standard protocol without IV contrast. RADIATION DOSE REDUCTION: This exam was performed according to the departmental dose-optimization program which includes automated exposure control, adjustment of the mA and/or kV according to patient size and/or use of iterative reconstruction technique. COMPARISON:  11/13/2022 FINDINGS: Cardiovascular: Unenhanced imaging of the heart is unremarkable without pericardial effusion. Normal caliber of the thoracic aorta. Atherosclerosis of the aorta and coronary vasculature. Evaluation of the vascular lumen is limited without IV contrast. Mediastinum/Nodes: No enlarged mediastinal or axillary lymph nodes. Thyroid gland, trachea, and esophagus demonstrate no significant findings. Lungs/Pleura: No acute airspace disease, effusion, or pneumothorax. Scattered areas of scarring are seen at the lung bases. Upper lobe predominant emphysema. There is a slightly spiculated subpleural right upper lobe pulmonary nodule  measuring 15 x 10 x 14 mm, reference image 24/4. Upper Abdomen: No acute abnormality. Musculoskeletal: No acute or destructive bony lesions. Chronic right-sided fourth through sixth rib  fractures are noted. Reconstructed images demonstrate no additional findings. IMPRESSION: 1. 13 mm suspicious right solid pulmonary nodule within the upper lobe. If the patient would be a therapy candidate should neoplasm be detected, oncology referral may be useful. Per Fleischner Society Guidelines, consider a non-contrast Chest CT at 3 months, a PET/CT, or tissue sampling. These guidelines do not apply to immunocompromised patients and patients with cancer. Follow up in patients with significant comorbidities as clinically warranted. For lung cancer screening, adhere to Lung-RADS guidelines. Reference: Radiology. 2017; 284(1):228-43. 2. Aortic Atherosclerosis (ICD10-I70.0) and Emphysema (ICD10-J43.9). Electronically Signed   By: Randa Ngo M.D.   On: 11/13/2022 23:04   MR BRAIN WO CONTRAST Result Date: 11/13/2022 CLINICAL DATA:  Acute neurologic deficit EXAM: MRI HEAD WITHOUT CONTRAST TECHNIQUE: Multiplanar, multiecho pulse sequences of the brain and surrounding structures were obtained without intravenous contrast. COMPARISON:  12/30/2015 FINDINGS: Brain: Small area of abnormal diffusion restriction within the right corona radiata. Chronic right subdural hematoma. No acute hemorrhage. There is multifocal hyperintense T2-weighted signal within the white matter. Generalized volume loss. The midline structures are normal. Vascular: Major flow voids are preserved. Skull and upper cervical spine: Normal calvarium and skull base. Visualized upper cervical spine and soft tissues are normal. Sinuses/Orbits:No paranasal sinus fluid levels or advanced mucosal thickening. No mastoid or middle ear effusion. Normal orbits. IMPRESSION: 1. Small area of acute or early subacute ischemia within the right corona radiata. 2. Chronic right subdural hematoma. Electronically Signed   By: Ulyses Jarred M.D.   On: 11/13/2022 20:48   CT ANGIO HEAD NECK W WO CM (CODE STROKE) Result Date: 11/13/2022 CLINICAL DATA:  Neuro  deficit, acute, stroke suspected. Left arm numbness and weakness. EXAM: CT ANGIOGRAPHY HEAD AND NECK TECHNIQUE: Multidetector CT imaging of the head and neck was performed using the standard protocol during bolus administration of intravenous contrast. Multiplanar CT image reconstructions and MIPs were obtained to evaluate the vascular anatomy. Carotid stenosis measurements (when applicable) are obtained utilizing NASCET criteria, using the distal internal carotid diameter as the denominator. RADIATION DOSE REDUCTION: This exam was performed according to the departmental dose-optimization program which includes automated exposure control, adjustment of the mA and/or kV according to patient size and/or use of iterative reconstruction technique. CONTRAST:  73m OMNIPAQUE IOHEXOL 350 MG/ML SOLN COMPARISON:  Head CT same day FINDINGS: CTA NECK FINDINGS Aortic arch: Aortic atherosclerosis. Branching pattern is normal without origin stenosis. Right carotid system: Common carotid artery widely patent to the bifurcation. Mild calcified plaque at the carotid bifurcation but no stenosis. Cervical ICA widely patent. Left carotid system: Common carotid artery widely patent to the bifurcation. Carotid bifurcation is normal. Cervical ICA is normal. Vertebral arteries: Both vertebral artery origins are widely patent. Both vertebral arteries appear normal through the cervical region to the foramen magnum. Skeleton: Chronic cervical spondylosis.  No acute finding. Other neck: No mass or lymphadenopathy. Upper chest: Emphysema and pulmonary scarring. Old rib fractures on the right. 11 mm nodule in the medial right upper lobe. This is new since 2017 and is worrisome for a possible lung cancer. Review of the MIP images confirms the above findings CTA HEAD FINDINGS Anterior circulation: Both internal carotid arteries are patent through the skull base and siphon regions. Ordinary siphon atherosclerosis. Carotid artery stent in the distal  siphon to supraclinoid ICA on the  right. No evidence of residual aneurysmal flow. The stent is patent. Flow is present in the anterior and middle cerebral arteries. No large vessel occlusion. Posterior circulation: Both vertebral arteries are patent through the foramen magnum to the basilar artery. No basilar stenosis. Superior cerebellar arteries and posterior cerebral arteries appear normal. Venous sinuses: Patent and normal. Anatomic variants: None significant. Review of the MIP images confirms the above findings IMPRESSION: 1. No acute large vessel occlusion. 2. Aortic atherosclerosis. 3. Mild atherosclerotic change at the right carotid bifurcation but no stenosis. 4. Right ICA stent in the distal siphon to supraclinoid ICA. No evidence of residual aneurysmal flow. 5. 11 mm nodule in the medial right upper lobe. This is new since 2017 and is worrisome for a possible lung cancer. Consider one of the following in 3 months for both low-risk and high-risk individuals: (a) repeat chest CT, (b) follow-up PET-CT, or (c) tissue sampling. This recommendation follows the consensus statement: Guidelines for Management of Incidental Pulmonary Nodules Detected on CT Images: From the Fleischner Society 2017; Radiology 2017; 284:228-243. Because of the presence of emphysema and pulmonary scarring, this patient is certainly high risk. Because this was not present in 2017, I think proceeding to PET scan or tissue sampling could be preferable. Aortic Atherosclerosis (ICD10-I70.0). Electronically Signed   By: Nelson Chimes M.D.   On: 11/13/2022 18:59   CT HEAD CODE STROKE WO CONTRAST Result Date: 11/13/2022 CLINICAL DATA:  Code stroke.  Neuro deficit, acute, stroke suspected EXAM: CT HEAD WITHOUT CONTRAST TECHNIQUE: Contiguous axial images were obtained from the base of the skull through the vertex without intravenous contrast. RADIATION DOSE REDUCTION: This exam was performed according to the departmental dose-optimization  program which includes automated exposure control, adjustment of the mA and/or kV according to patient size and/or use of iterative reconstruction technique. COMPARISON:  CT head January 07, 2019. FINDINGS: Brain: Mild increase in thickness of a right subdural low-attenuation collection, measuring approximately 1.1 cm in thickness. Suspected trace left subdural low-attenuation fluid collection is unchanged. No evidence of acute large vascular territory infarct, or definitely acute hemorrhage. Mild associated mass effect without midline shift. Vascular: No hyperdense vessel identified. Right ICA stent. Calcific atherosclerosis. Skull: No acute fracture. Sinuses/Orbits: Fluid in left maxillary sinus. No acute orbital findings. Other: No mastoid effusions. ASPECTS [facility] Stroke Program Early CT Score) total score (0-10 with 10 being normal): 10. IMPRESSION: 1. Mild increase in thickness of a right subdural low-attenuation collection, measuring approximately 1.1 cm in thickness. Suspected trace left subdural low-attenuation fluid collection is unchanged. 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 Mild associated mass effect without midline shift. 2. Otherwise, no evidence of acute intracranial abnormality. Code stroke imaging results were communicated on 11/13/2022 at 6:38 pm to provider Baghat via telephone, who verbally acknowledged these results. Electronically Signed   By: Margaretha Sheffield M.D.   On: 11/13/2022 18:40    12-lead ECG SR All prior EKG's in EPIC reviewed with no documented atrial fibrillation  Telemetry SR, no AFib  Assessment and Plan:  1. Cryptogenic stroke The patient presents with cryptogenic stroke.   I spoke at length with the patient and his wife bedside (who happens to have AFib) about monitoring for afib with an implantable loop recorder, having previously worn a wearable monitor.  Risks, benefits, and alteratives to implantable loop recorder were  discussed with the patient today.   At this time, the patient is very clear in their decision to proceed with implantable loop recorder.  Wound care was reviewed with the patient (keep incision clean and dry for 3 days).  Wound check follow up will be scheduled for the patient.  Please call with questions.   Baldwin Jamaica, PA-C 11/15/2022

## 2022-11-15 NOTE — H&P (Signed)
Physical Medicine and Rehabilitation Admission H&P     CC: Functional deficits secondary to acute right punctate MCA/ACA watershed infarcts   HPI: Damon Parrish is an 82 year old male who presented to [facility] ER on 11/13/2022 onset of left-sided weakness. Code stroke was initiated.  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  He awoke on the morning of presentation in his normal state of health.  He went back to sleep and woke up approximately 4 hours later and noticed he was clumsy in his left hand and left leg was weak and shaky.  He is maintained on aspirin 325 mg.  He was out of the window for tenecteplase.  Imaging revealed chronic relatively stable subdural low-attenuation collection.  CTA of head and neck with no LVO.  MRI of the brain revealed small area of acute or early subacute ischemia within the right corona radiata.   He has history of both strokes and seizures and was seen in April 2017 at [facility] with patchy acute ischemia of the right frontoparietal lobes in the MCA territory infarct.  He recovered well and was followed up with neurology in 2018.  He developed seizures at this same timeframe and was put on Keppra and titrated off.  However in 2020 he was seen again for abnormal left lower extremity movements that were thought to be focal seizures and Keppra was resumed at 500 mg twice daily.  He is now on aspirin and Plavix and recommend duration for 3 weeks then Plavix alone.  As part of his workup, which included CTA of the neck, an 11 mm nodule in the medial right upper lobe was noted.  He underwent CT scan of the chest with recommendations to follow-up as an outpatient with repeat CT scan of chest in 3 months with or without PET scanning or tissue sampling.  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  He has a history of hypertension maintained on Norvasc and this is held to allow permissive hypertension.  He takes Lipitor 40 mg daily for hyperlipidemia and is now on high intensity therapy of 80 mg daily.  He will have loop recorder placed today.  He requires +2 min to mod assist for functional mobility.  He is tolerating a regular diet.The patient requires inpatient physical medicine and rehabilitation evaluations and treatment secondary to dysfunction due to right punctate MCA/ACA watershed infarcts.   Wife at bedside. Patient dozing off during my visit. He has several episodes of brief apnea which would wake him. No BM in 3 days.   Review of Systems  Constitutional:  Negative for chills and fever.  HENT:  Negative for hearing loss and sore throat.   Cardiovascular:  Negative for chest pain and palpitations.  Gastrointestinal:  Positive for constipation. Negative for diarrhea, nausea and vomiting.  Genitourinary:  Negative for dysuria and urgency.  Musculoskeletal:  Negative for back pain and neck pain.  Neurological:  Negative for dizziness and headaches.  Psychiatric/Behavioral:  Negative for depression. The patient does not have insomnia.        Wife says he frequently naps during the daytime.        Past Medical History:  Diagnosis Date   Arthritis      "generalized" (01/09/2018)   Chronic lower back pain      "last couple months" (  01/09/2018)   Chronic sinus complaints     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   GERD (gastroesophageal reflux disease)     H/O cerebral aneurysm repair 2016    "put stent in"   High cholesterol     History of blood transfusion ~ 1950    "while in hospital w/pneumonia" (01/09/2018)   History of kidney stones     Hypertension     Nonruptured cerebral aneurysm     Pneumonia ~ 1950   Red man syndrome     Seizures ([place]) 12/27/2015; 12/28/2015   Stroke ([place]) 12/2015    denies residual on 01/09/2018)         Past Surgical History:  Procedure  Laterality Date   CATARACT EXTRACTION W/ INTRAOCULAR LENS  IMPLANT, BILATERAL Bilateral     COLONOSCOPY        2013 per patient: done in [place], normal, next colonoscopy due in 10 years   FOOT FRACTURE SURGERY Left      "pins, etc. in there"   [place] N/A 05/04/2015    Procedure: Pipeline Embolization;  Surgeon: Consuella Lose, MD;  Location: [facility];  Service: Radiology;  Laterality: N/A;   RADIOLOGY WITH ANESTHESIA N/A 05/31/2015    Procedure: Pipeline Embolization;  Surgeon: Consuella Lose, MD;  Location: [facility];  Service: Radiology;  Laterality: N/A;         Family History  Problem Relation Age of Onset   Lymphoma Mother     Dementia Mother     Aneurysm Father     Colon cancer Neg Hx      Social History:  reports that he has been smoking cigarettes. He has a 61.00 pack-year smoking history. He has never used smokeless tobacco. He reports that he does not drink alcohol and does not use drugs. Allergies:       Allergies  Allergen Reactions   Cephalosporins Rash      Full body rash with systemic symptoms   Vancomycin Rash      Full body rash with systemic symptoms          Medications Prior to Admission  Medication Sig Dispense Refill   amLODipine (NORVASC) 10 MG tablet Take 10 mg by mouth daily.       Ascorbic Acid (VITAMIN C) 1000 MG tablet Take 1,000 mg by mouth daily.       aspirin 325 MG tablet Take 325 mg by mouth daily.       atorvastatin (LIPITOR) 40 MG tablet Take 40 mg by mouth daily.       bimatoprost (LUMIGAN) 0.03 % ophthalmic solution Place 1 drop into the right eye at bedtime.       Brinzolamide-Brimonidine (SIMBRINZA) 1-0.2 % SUSP Place 1 drop into both eyes 2 (two) times daily.        Cholecalciferol (VITAMIN D-1000 MAX ST) 1000 units tablet Take 1 tablet by mouth daily.       finasteride (PROSCAR) 5 MG tablet Take 5 mg by mouth daily.       levETIRAcetam (KEPPRA) 500 MG tablet Take 1 tablet (500 mg total) by  mouth 2 (two) times daily. 60 tablet 1   Multiple Vitamin (MULTIVITAMIN) capsule Take 1 capsule by mouth daily.       multivitamin-lutein (OCUVITE-LUTEIN) CAPS capsule Take 1 capsule by mouth daily.   0   prednisoLONE acetate (PRED FORTE) 1 % ophthalmic suspension Place 1 drop into the left eye daily.  tamsulosin (FLOMAX) 0.4 MG CAPS capsule Take 0.4 mg by mouth daily.       timolol (TIMOPTIC) 0.25 % ophthalmic solution Place 1 drop into the right eye daily.       TRELEGY ELLIPTA 100-62.5-25 MCG/ACT AEPB Inhale 1 puff into the lungs daily.       vitamin E 400 UNIT capsule Take 400 Units by mouth daily.              Home: Home Living Family/patient expects to be discharged to:: Private residence Living Arrangements: Spouse/significant other Available Help at Discharge: Family, Available 24 hours/day Type of Home: House Home Access: Stairs to enter Technical brewer of Steps: 1 Entrance Stairs-Rails: None Home Layout: One level Bathroom Shower/Tub: Multimedia programmer: [place]ler: Conservation officer, nature (2 wheels), [employer] - single point, Civil engineer, contracting - built in, Ship broker, Wheelchair - manual Additional Comments: retired from working at newspaper in [employer] With: Spouse   Functional History: Prior Function Prior Level of Function : Independent/Modified Independent, Driving Mobility Comments: walk with [place] out of home, no AD in home ADLs Comments: indepedent ADls, driving but spouse manages meds and finances   Functional Status:  Mobility: Bed Mobility Overal bed mobility: Needs Assistance Bed Mobility: Supine to Sit Supine to sit: Min guard, HOB elevated General bed mobility comments: guarding for safety, pt required extra effort to complete, HOB elevated and using bed rail. Transfers Overall transfer level: Needs assistance Equipment used: 2 person hand held assist Transfers: Sit to/from Stand Sit to Stand: Min assist, +2  safety/equipment General transfer comment: Min+2 for power up from EOB, 2HHA provided and pt stead once standing. BOS slightly wider than shoulder width. Ambulation/Gait Ambulation/Gait assistance: +2 safety/equipment, Min assist, Mod assist Gait Distance (Feet): 10 Feet Assistive device: 2 person hand held assist Gait Pattern/deviations: Step-through pattern, Decreased step length - left, Decreased stance time - left, Decreased stride length, Decreased dorsiflexion - left, Shuffle, Ataxic, Staggering left General Gait Details: PT took initial steps forward and back to bed with min +2 assist. HR elevated to 130 bpm and pt sat EOB to recover after going ~3'. HR recovered quickly to 80's. Pt stood to ambulate around foot of bed and more fatigued, Lt LE ER and Abducated in standing and pt with great difficulty advancing Lt LE. Pt drifting into cabinet in room wiht Lt half of body despite cues to avoid, Mod assist required to come in front of pt and guide his steps to Rt to move back toward EOB. HR in 110-120's on second bout of gait, receovered to 70's. Gait velocity: decreased Gait velocity interpretation: <1.8 ft/sec, indicate of risk for recurrent falls   ADL: ADL Overall ADL's : Needs assistance/impaired Grooming: Minimal assistance, Standing Upper Body Dressing : Minimal assistance, Sitting Lower Body Dressing: Sit to/from stand, Moderate assistance Toilet Transfer: Minimal assistance, Ambulation, Rolling walker (2 wheels) Functional mobility during ADLs: Minimal assistance, Rolling walker (2 wheels) General ADL Comments: +2 safety for functional mobiltiy   Cognition: Cognition Overall Cognitive Status: Impaired/Different from baseline Arousal/Alertness: Awake/alert Orientation Level: Oriented to person, Oriented to place, Oriented to time Year: 2024 Day of Week: Correct Attention: Sustained Sustained Attention: Appears intact Memory: Impaired Memory Impairment: Storage deficit,  Retrieval deficit Awareness: Impaired Awareness Impairment: Emergent impairment, Intellectual impairment, Anticipatory impairment Problem Solving: Impaired Problem Solving Impairment: Verbal basic Safety/Judgment: Impaired Cognition Arousal/Alertness: Awake/alert Behavior During Therapy: WFL for tasks assessed/performed Overall Cognitive Status: Impaired/Different from baseline Area of Impairment: Awareness,  Safety/judgement, Following commands, Problem solving Following Commands: Follows multi-step commands inconsistently, Follows one step commands consistently, Follows one step commands with increased time Safety/Judgement: Decreased awareness of safety, Decreased awareness of deficits Awareness: Emergent Problem Solving: Slow processing, Requires verbal cues, Difficulty sequencing General Comments: pt has decreased awareness of what exactly is wrong and decreased awareness of deficits. Follows commands inconsistently with increased time, repeated cues. impaired planning for tasks.   Physical Exam: Blood pressure 136/72, pulse 65, temperature 98.9 F (37.2 C), temperature source Oral, resp. rate 19, height 5' 9"$  (1.753 m), weight 81.1 kg, SpO2 95 %.  Physical Exam Constitutional: No apparent distress. Appropriate appearance for age.  HENT: No JVD. Neck Supple. Trachea midline. Atraumatic, normocephalic. Eyes: PERRLA. EOMI. Visual fields grossly intact.  Cardiovascular: RRR, no murmurs/rub/gallops. No Edema. Peripheral pulses 2+  Respiratory: CTAB. Decreased breath sounds throughout. No rales, rhonchi, or wheezing. On RA.  Abdomen: + bowel sounds, normoactive. No distention or tenderness.  Skin: C/D/I. No apparent lesions. MSK:      No apparent deformity.      Strength:                RUE: 5/5 SA, 5/5 EF, 5/5 EE, 5/5 WE, 5/5 FF, 5/5 FA                 LUE: 5-/5 SA, 5-/5 EF, 5-/5 EE, 5-/5 WE, 5-/5 FF, 5-/5 FA                 RLE: 5/5 HF, 5/5 KE, 5/5 DF, 5/5 EHL, 5/5 PF                  LLE:  5/5 HF, 5/5 KE, 5/5 DF, 5/5 EHL, 5/5 PF   Neurologic exam:  Cognition: AAO to person, place, time and event.  Language: Fluent, No substitutions or neoglisms. No dysarthria. Names 3/3 objects correctly.  Memory: Recalls 1/3 objects at 5 minutes. No apparent deficits  Insight: Good insight into current condition.  Mood: Pleasant affect, appropriate mood.  Sensation: To light touch intact in BL UEs and LEs  Reflexes: 2+ in BL UE and LEs. Negative Hoffman's and babinski signs bilaterally.  CN: 2-12 grossly intact.  Coordination: No apparent tremors. + LUE ataxia on FTN, none on HTS bilaterally.  Spasticity: MAS 0 in all extremities.       Lab Results Last 48 Hours        Results for orders placed or performed during the hospital encounter of 11/13/22 (from the past 48 hour(s))  CBG monitoring, ED     Status: Abnormal    Collection Time: 11/13/22  6:17 PM  Result Value Ref Range    Glucose-Capillary 122 (H) 70 - 99 mg/dL      Comment: Glucose reference range applies only to samples taken after fasting for at least 8 hours.  Protime-INR     Status: None    Collection Time: 11/13/22  6:20 PM  Result Value Ref Range    Prothrombin Time 14.3 11.4 - 15.2 seconds    INR 1.1 0.8 - 1.2      Comment: (NOTE) INR goal varies based on device and disease states. Performed at [facility], [place] [address], [place], [place] [address]    APTT     Status: None    Collection Time: 11/13/22  6:20 PM  Result Value Ref Range    aPTT 33 24 - 36 seconds      Comment: Performed at [facility]  [facility], [place] [address], [place], [place] [address]  CBC     Status: Abnormal    Collection Time: 11/13/22  6:20 PM  Result Value Ref Range    WBC 9.7 4.0 - 10.5 K/uL    RBC 4.44 4.22 - 5.81 MIL/uL    Hemoglobin 15.3 13.0 - 17.0 g/dL    HCT 43.6 39.0 - 52.0 %    MCV 98.2 80.0 - 100.0 fL    MCH 34.5 (H) 26.0 - 34.0 pg    MCHC 35.1 30.0 - 36.0 g/dL    RDW 12.2 11.5 - 15.5 %    Platelets 254 150  - 400 K/uL    nRBC 0.0 0.0 - 0.2 %      Comment: Performed at [facility], [place] [address] [place], [place] [address]  Differential     Status: None    Collection Time: 11/13/22  6:20 PM  Result Value Ref Range    Neutrophils Relative % 58 %    Neutro Abs 5.6 1.7 - 7.7 K/uL    Lymphocytes Relative 33 %    Lymphs Abs 3.2 0.7 - 4.0 K/uL    Monocytes Relative 7 %    Monocytes Absolute 0.6 0.1 - 1.0 K/uL    Eosinophils Relative 2 %    Eosinophils Absolute 0.2 0.0 - 0.5 K/uL    Basophils Relative 0 %    Basophils Absolute 0.0 0.0 - 0.1 K/uL    Immature Granulocytes 0 %    Abs Immature Granulocytes 0.04 0.00 - 0.07 K/uL      Comment: Performed at [facility] [address] [place], [place] [address]  Comprehensive metabolic panel     Status: Abnormal    Collection Time: 11/13/22  6:20 PM  Result Value Ref Range    Sodium 131 (L) 135 - 145 mmol/L    Potassium 4.3 3.5 - 5.1 mmol/L      Comment: HEMOLYSIS AT THIS LEVEL MAY AFFECT RESULT    Chloride 98 98 - 111 mmol/L    CO2 22 22 - 32 mmol/L    Glucose, Bld 130 (H) 70 - 99 mg/dL      Comment: Glucose reference range applies only to samples taken after fasting for at least 8 hours.    BUN 18 8 - 23 mg/dL    Creatinine, Ser 1.01 0.61 - 1.24 mg/dL    Calcium 9.5 8.9 - 10.3 mg/dL    Total Protein 6.7 6.5 - 8.1 g/dL    Albumin 4.0 3.5 - 5.0 g/dL    AST 33 15 - 41 U/L      Comment: HEMOLYSIS AT THIS LEVEL MAY AFFECT RESULT    ALT 27 0 - 44 U/L      Comment: HEMOLYSIS AT THIS LEVEL MAY AFFECT RESULT    Alkaline Phosphatase 106 38 - 126 U/L    Total Bilirubin 1.0 0.3 - 1.2 mg/dL      Comment: HEMOLYSIS AT THIS LEVEL MAY AFFECT RESULT    GFR, Estimated >60 >60 mL/min      Comment: (NOTE) Calculated using the CKD-EPI Creatinine Equation (2021)      Anion gap 11 5 - 15      Comment: Performed at [facility], [place] [address] [place], [place] [address]  Hemoglobin A1c     Status: Abnormal    Collection Time: 11/13/22   6:20 PM  Result Value Ref Range    Hgb A1c MFr  Bld 6.1 (H) 4.8 - 5.6 %      Comment: (NOTE) Pre diabetes:          5.7%-6.4%   Diabetes:              >6.4%   Glycemic control for   <7.0% adults with diabetes      Mean Plasma Glucose 128.37 mg/dL      Comment: Performed at [facility], [place] [address] [place], [place] [address]  I-stat chem 8, ED     Status: Abnormal    Collection Time: 11/13/22  6:29 PM  Result Value Ref Range    Sodium 132 (L) 135 - 145 mmol/L    Potassium 4.3 3.5 - 5.1 mmol/L    Chloride 100 98 - 111 mmol/L    BUN 22 8 - 23 mg/dL    Creatinine, Ser 1.00 0.61 - 1.24 mg/dL    Glucose, Bld 125 (H) 70 - 99 mg/dL      Comment: Glucose reference range applies only to samples taken after fasting for at least 8 hours.    Calcium, Ion 1.07 (L) 1.15 - 1.40 mmol/L    TCO2 22 22 - 32 mmol/L    Hemoglobin 15.3 13.0 - 17.0 g/dL    HCT 45.0 39.0 - 52.0 %  Ethanol     Status: None    Collection Time: 11/13/22  6:30 PM  Result Value Ref Range    Alcohol, Ethyl (B) <10 <10 mg/dL      Comment: (NOTE) Lowest detectable limit for serum alcohol is 10 mg/dL.   For medical purposes only. Performed at [facility], [place] [address], [place], [place] [address]    Urine rapid drug screen (hosp performed)     Status: None    Collection Time: 11/13/22  7:37 PM  Result Value Ref Range    Opiates NONE DETECTED NONE DETECTED    Cocaine NONE DETECTED NONE DETECTED    Benzodiazepines NONE DETECTED NONE DETECTED    Amphetamines NONE DETECTED NONE DETECTED    Tetrahydrocannabinol NONE DETECTED NONE DETECTED    Barbiturates NONE DETECTED NONE DETECTED      Comment: (NOTE) DRUG SCREEN FOR MEDICAL PURPOSES ONLY.  IF CONFIRMATION IS NEEDED FOR ANY PURPOSE, NOTIFY LAB WITHIN 5 DAYS.   LOWEST DETECTABLE LIMITS FOR URINE DRUG SCREEN Drug Class                     Cutoff (ng/mL) Amphetamine and metabolites    1000 Barbiturate and metabolites    200 Benzodiazepine                  200 Opiates and metabolites        300 Cocaine and metabolites        300 THC                            50 Performed at [facility], [place] [address] [place], [place] [address]    Urinalysis, Routine w reflex microscopic -Urine, Clean Catch     Status: Abnormal    Collection Time: 11/13/22  7:37 PM  Result Value Ref Range    Color, Urine STRAW (A) YELLOW    APPearance CLEAR CLEAR    Specific Gravity, Urine 1.029 1.005 - 1.030    pH 7.0 5.0 - 8.0    Glucose, UA NEGATIVE NEGATIVE mg/dL    Hgb urine  dipstick NEGATIVE NEGATIVE    Bilirubin Urine NEGATIVE NEGATIVE    Ketones, ur NEGATIVE NEGATIVE mg/dL    Protein, ur NEGATIVE NEGATIVE mg/dL    Nitrite NEGATIVE NEGATIVE    Leukocytes,Ua NEGATIVE NEGATIVE      Comment: Performed at [facility] [address], [place], [place] [address]  Resp panel by RT-PCR (RSV, Flu A&B, Covid) Anterior Nasal Swab     Status: None    Collection Time: 11/13/22 11:21 PM    Specimen: Anterior Nasal Swab  Result Value Ref Range    SARS Coronavirus 2 by RT PCR NEGATIVE NEGATIVE    Influenza A by PCR NEGATIVE NEGATIVE    Influenza B by PCR NEGATIVE NEGATIVE      Comment: (NOTE) The Xpert Xpress SARS-CoV-2/FLU/RSV plus assay is intended as an aid in the diagnosis of influenza from Nasopharyngeal swab specimens and should not be used as a sole basis for treatment. Nasal washings and aspirates are unacceptable for Xpert Xpress SARS-CoV-2/FLU/RSV testing.   Fact Sheet for Patients: EntrepreneurPulse.com.au   Fact Sheet for Healthcare Providers: IncredibleEmployment.be   This test is not yet approved or cleared by the [place] FDA and has been authorized for detection and/or diagnosis of SARS-CoV-2 by FDA under an Emergency Use Authorization (EUA). This EUA will remain in effect (meaning this test can be used) for the duration of the COVID-19 declaration under Section 564(b)(1) of the Act, 21  U.S.C. section 360bbb-3(b)(1), unless the authorization is terminated or revoked.        Resp Syncytial Virus by PCR NEGATIVE NEGATIVE      Comment: (NOTE) Fact Sheet for Patients: EntrepreneurPulse.com.au   Fact Sheet for Healthcare Providers: IncredibleEmployment.be   This test is not yet approved or cleared by the [place] FDA and has been authorized for detection and/or diagnosis of SARS-CoV-2 by FDA under an Emergency Use Authorization (EUA). This EUA will remain in effect (meaning this test can be used) for the duration of the COVID-19 declaration under Section 564(b)(1) of the Act, 21 U.S.C. section 360bbb-3(b)(1), unless the authorization is terminated or revoked.   Performed at [facility], [place] [address] [place], [place] [address]    Levetiracetam level     Status: Abnormal    Collection Time: 11/14/22 12:18 AM  Result Value Ref Range    Levetiracetam Lvl <2.0 (L) 10.0 - 40.0 ug/mL      Comment: (NOTE) Performed At: [facility] [place], [place] [id number] Rush Farmer MD [id number]    Lipid panel     Status: None    Collection Time: 11/14/22 12:18 AM  Result Value Ref Range    Cholesterol 146 0 - 200 mg/dL    Triglycerides 96 <150 mg/dL    HDL 46 >40 mg/dL    Total CHOL/HDL Ratio 3.2 RATIO    VLDL 19 0 - 40 mg/dL    LDL Cholesterol 81 0 - 99 mg/dL      Comment:        Total Cholesterol/HDL:CHD Risk Coronary Heart Disease Risk Table                     Men   Women  1/2 Average Risk   3.4   3.3  Average Risk       5.0   4.4  2 X Average Risk   9.6   7.1  3 X Average Risk  23.4   11.0  Use the calculated Patient Ratio above and the CHD Risk Table to determine the patient's CHD Risk.        ATP III CLASSIFICATION (LDL):  <100     mg/dL   Optimal  100-129  mg/dL   Near or Above                    Optimal  130-159  mg/dL   Borderline  160-189  mg/dL   High  >190      mg/dL   Very High Performed at [facility] [address], [place], [place] [address]         Imaging Results (Last 48 hours)  Overnight EEG with video   Result Date: 11/15/2022 Lora Havens, MD     11/15/2022  9:43 AM Patient Name: Damon Parrish MRN: [id number] Epilepsy Attending: Lora Havens Referring Physician/Provider: Rosalin Hawking, MD Duration: 11/14/2022 2314 to 11/15/2022 0930 Patient history: 82yo M with left hand shaking jerking spasm for the last 76mn. EEG to evaluate for seizure Level of alertness: Awake, asleep AEDs during EEG study: LEV Technical aspects: This EEG study was done with scalp electrodes positioned according to the 10-20 International system of electrode placement. Electrical activity was reviewed with band pass filter of 1-70Hz$ , sensitivity of 7 uV/mm, display speed of 361msec with a 60Hz$  notched filter applied as appropriate. EEG data were recorded continuously and digitally stored.  Video monitoring was available and reviewed as appropriate. Description: The posterior dominant rhythm consists of 9-10 Hz activity of moderate voltage (25-35 uV) seen predominantly in posterior head regions, symmetric and reactive to eye opening and eye closing. Sleep was characterized by vertex waves, sleep spindles (12 to 14 Hz), maximal frontocentral region. EEG showed intermittent 3 to 6 Hz theta-delta slowing in right hemisphere. Generalized low amplitude 12-15hz$  beta activity was also noted. Hyperventilation and photic stimulation were not performed.   ABNORMALITY - Intermittent slow, right hemisphere IMPRESSION: This study is suggestive of cortical dysfunction arising from right hemisphere likely secondary to underlying structural abnormality. No seizures or epileptiform discharges were seen throughout the recording. Please note lack of epileptiform activity during interictal EEG does not exclude the diagnosis of epilepsy. PrLora Havens  ECHOCARDIOGRAM COMPLETE    Result Date: 11/14/2022    ECHOCARDIOGRAM REPORT   Patient Name:   JAJAQUAI OFFERMANNate of Exam: 11/14/2022 Medical Rec #:  [id number]   Height:       69.0 in Accession #:    [id number]  Weight:       178.8 lb Date of Birth:  11/1940/03/26    BSA:          1.970 m Patient Age:    [age]      BP:           130/74 mmHg Patient Gender: M             HR:           67 bpm. Exam Location:  Inpatient Procedure: 2D Echo, Cardiac Doppler and Color Doppler Indications:    R55 Syncope  History:        Patient has no prior history of Echocardiogram examinations.                 Stroke; Risk Factors:Current Smoker and Hypertension.  Sonographer:    GiWilkie AyeVT RCS Referring Phys: [id number] [facility] Hospital-Ft HuachucaSonographer Comments: No parasternal  window, suboptimal subcostal window, Technically challenging study due to limited acoustic windows and Technically difficult study due to poor echo windows. IMPRESSIONS  1. Left ventricular ejection fraction, by estimation, is 60 to 65%. Left ventricular ejection fraction by 3D volume is 64 %. The left ventricle has normal function. The left ventricle has no regional wall motion abnormalities. There is mild left ventricular hypertrophy. Left ventricular diastolic parameters are consistent with Grade I diastolic dysfunction (impaired relaxation).  2. Right ventricular systolic function is normal. The right ventricular size is mildly enlarged. There is normal pulmonary artery systolic pressure. The estimated right ventricular systolic pressure is [id number] mmHg.  3. The mitral valve is normal in structure. No evidence of mitral valve regurgitation. No evidence of mitral stenosis.  4. The aortic valve was not well visualized. Aortic valve regurgitation is not visualized. No aortic stenosis is present.  5. The inferior vena cava is normal in size with <50% respiratory variability, suggesting right atrial pressure of 8 mmHg. FINDINGS  Left Ventricle: Left ventricular ejection fraction, by  estimation, is 60 to 65%. Left ventricular ejection fraction by 3D volume is 64 %. The left ventricle has normal function. The left ventricle has no regional wall motion abnormalities. The left ventricular internal cavity size was normal in size. There is mild left ventricular hypertrophy. Left ventricular diastolic parameters are consistent with Grade I diastolic dysfunction (impaired relaxation). Right Ventricle: The right ventricular size is mildly enlarged. No increase in right ventricular wall thickness. Right ventricular systolic function is normal. There is normal pulmonary artery systolic pressure. The tricuspid regurgitant velocity is 1.89  m/s, and with an assumed right atrial pressure of 8 mmHg, the estimated right ventricular systolic pressure is [id number] mmHg. Left Atrium: Left atrial size was normal in size. Right Atrium: Right atrial size was normal in size. Prominent Eustachian valve. Pericardium: There is no evidence of pericardial effusion. Presence of epicardial fat layer. Mitral Valve: The mitral valve is normal in structure. No evidence of mitral valve regurgitation. No evidence of mitral valve stenosis. Tricuspid Valve: The tricuspid valve is normal in structure. Tricuspid valve regurgitation is trivial. No evidence of tricuspid stenosis. Aortic Valve: The aortic valve was not well visualized. Aortic valve regurgitation is not visualized. No aortic stenosis is present. Aortic valve mean gradient measures 3.0 mmHg. Aortic valve peak gradient measures 6.0 mmHg. Aortic valve area, by VTI measures 2.47 cm. Pulmonic Valve: The pulmonic valve was not well visualized. Aorta: The aortic root is normal in size and structure. Venous: The inferior vena cava is normal in size with less than 50% respiratory variability, suggesting right atrial pressure of 8 mmHg. IAS/Shunts: No atrial level shunt detected by color flow Doppler.  LEFT VENTRICLE PLAX 2D LVIDd:         4.10 cm         Diastology LVIDs:          3.20 cm         LV e' medial:    6.48 cm/s LV PW:         1.10 cm         LV E/e' medial:  9.7 LV IVS:        1.20 cm         LV e' lateral:   7.42 cm/s LVOT diam:     1.80 cm         LV E/e' lateral: 8.4 LV SV:         58 LV SV Index:  29 LVOT Area:     2.54 cm        3D Volume EF                                LV 3D EF:    Left                                             ventricul                                             ar                                             ejection                                             fraction                                             by 3D                                             volume is                                             64 %.                                 3D Volume EF:                                3D EF:        64 %                                LV EDV:       109 ml                                LV ESV:       39 ml                                LV SV:        70 ml RIGHT VENTRICLE             IVC RV Basal diam:  3.30 cm  IVC diam: 1.90 cm RV S prime:     12.80 cm/s TAPSE (M-mode): 2.0 cm LEFT ATRIUM             Index        RIGHT ATRIUM           Index LA diam:        3.40 cm 1.73 cm/m   RA Area:     11.60 cm LA Vol (A2C):   49.2 ml 24.98 ml/m  RA Volume:   22.00 ml  11.17 ml/m LA Vol (A4C):   32.3 ml 16.40 ml/m LA Biplane Vol: 42.8 ml 21.73 ml/m  AORTIC VALVE AV Area (Vmax):    2.73 cm AV Area (Vmean):   2.52 cm AV Area (VTI):     2.47 cm AV Vmax:           122.00 cm/s AV Vmean:          84.000 cm/s AV VTI:            0.235 m AV Peak Grad:      6.0 mmHg AV Mean Grad:      3.0 mmHg LVOT Vmax:         131.00 cm/s LVOT Vmean:        83.100 cm/s LVOT VTI:          0.228 m LVOT/AV VTI ratio: 0.97  AORTA Ao Root diam: 3.30 cm MITRAL VALVE               TRICUSPID VALVE MV Area (PHT): 3.76 cm    TR Peak grad:   14.3 mmHg MV Decel Time: 202 msec    TR Vmax:        189.00 cm/s MV E velocity: 62.60 cm/s MV A velocity: 84.00 cm/s  SHUNTS MV E/A  ratio:  0.75        Systemic VTI:  0.23 m                            Systemic Diam: 1.80 cm Cherlynn Kaiser MD Electronically signed by Cherlynn Kaiser MD Signature Date/Time: 11/14/2022/9:11:28 PM    Final     VAS [place] LOWER EXTREMITY VENOUS (DVT)   Result Date: 11/14/2022  Lower Venous DVT Study Patient Name:  FLOID KETTERLING  Date of Exam:   11/14/2022 Medical Rec #: [id number]      Accession #:    [id number] Date of Birth: November 29, 1940       Patient Gender: M Patient Age:   1 years Exam Location:  [facility] Procedure:      VAS [place] LOWER EXTREMITY VENOUS (DVT) Referring Phys: Cornelius Moras XU --------------------------------------------------------------------------------  Indications: Stroke.  Risk Factors: None identified. Comparison Study: No prior studies. Performing Technologist: Oliver Hum RVT  Examination Guidelines: A complete evaluation includes B-mode imaging, spectral Doppler, color Doppler, and power Doppler as needed of all accessible portions of each vessel. Bilateral testing is considered an integral part of a complete examination. Limited examinations for reoccurring indications may be performed as noted. The reflux portion of the exam is performed with the patient in reverse Trendelenburg.  +---------+---------------+---------+-----------+----------+--------------+ RIGHT    CompressibilityPhasicitySpontaneityPropertiesThrombus Aging +---------+---------------+---------+-----------+----------+--------------+ CFV      Full           Yes      Yes                                 +---------+---------------+---------+-----------+----------+--------------+  SFJ      Full                                                        +---------+---------------+---------+-----------+----------+--------------+ FV Prox  Full                                                        +---------+---------------+---------+-----------+----------+--------------+ FV Mid   Full                                                         +---------+---------------+---------+-----------+----------+--------------+ FV DistalFull                                                        +---------+---------------+---------+-----------+----------+--------------+ PFV      Full                                                        +---------+---------------+---------+-----------+----------+--------------+ POP      Full           Yes      Yes                                 +---------+---------------+---------+-----------+----------+--------------+ PTV      Full                                                        +---------+---------------+---------+-----------+----------+--------------+ PERO     Full                                                        +---------+---------------+---------+-----------+----------+--------------+   +---------+---------------+---------+-----------+----------+--------------+ LEFT     CompressibilityPhasicitySpontaneityPropertiesThrombus Aging +---------+---------------+---------+-----------+----------+--------------+ CFV      Full           Yes      Yes                                 +---------+---------------+---------+-----------+----------+--------------+ SFJ      Full                                                        +---------+---------------+---------+-----------+----------+--------------+   FV Prox  Full                                                        +---------+---------------+---------+-----------+----------+--------------+ FV Mid   Full                                                        +---------+---------------+---------+-----------+----------+--------------+ FV DistalFull                                                        +---------+---------------+---------+-----------+----------+--------------+ PFV      Full                                                         +---------+---------------+---------+-----------+----------+--------------+ POP      Full           Yes      Yes                                 +---------+---------------+---------+-----------+----------+--------------+ PTV      Full                                                        +---------+---------------+---------+-----------+----------+--------------+ PERO     Full                                                        +---------+---------------+---------+-----------+----------+--------------+     Summary: RIGHT: - There is no evidence of deep vein thrombosis in the lower extremity.  - No cystic structure found in the popliteal fossa.  LEFT: - There is no evidence of deep vein thrombosis in the lower extremity.  - No cystic structure found in the popliteal fossa.  *See table(s) above for measurements and observations. Electronically signed by Monica Martinez MD on 11/14/2022 at 2:26:37 PM.    Final     CT CHEST WO CONTRAST   Result Date: 11/13/2022 CLINICAL DATA:  Right upper lobe lung nodule on earlier CT angiography EXAM: CT CHEST WITHOUT CONTRAST TECHNIQUE: Multidetector CT imaging of the chest was performed following the standard protocol without IV contrast. RADIATION DOSE REDUCTION: This exam was performed according to the departmental dose-optimization program which includes automated exposure control, adjustment of the mA and/or kV according to patient size and/or use of iterative reconstruction technique. COMPARISON:  11/13/2022 FINDINGS: Cardiovascular: Unenhanced imaging of the heart is unremarkable without pericardial effusion. Normal caliber of  the thoracic aorta. Atherosclerosis of the aorta and coronary vasculature. Evaluation of the vascular lumen is limited without IV contrast. Mediastinum/Nodes: No enlarged mediastinal or axillary lymph nodes. Thyroid gland, trachea, and esophagus demonstrate no significant findings. Lungs/Pleura: No acute airspace disease,  effusion, or pneumothorax. Scattered areas of scarring are seen at the lung bases. Upper lobe predominant emphysema. There is a slightly spiculated subpleural right upper lobe pulmonary nodule measuring 15 x 10 x 14 mm, reference image 24/4. Upper Abdomen: No acute abnormality. Musculoskeletal: No acute or destructive bony lesions. Chronic right-sided fourth through sixth rib fractures are noted. Reconstructed images demonstrate no additional findings. IMPRESSION: 1. 13 mm suspicious right solid pulmonary nodule within the upper lobe. If the patient would be a therapy candidate should neoplasm be detected, oncology referral may be useful. Per Fleischner Society Guidelines, consider a non-contrast Chest CT at 3 months, a PET/CT, or tissue sampling. These guidelines do not apply to immunocompromised patients and patients with cancer. Follow up in patients with significant comorbidities as clinically warranted. For lung cancer screening, adhere to Lung-RADS guidelines. Reference: Radiology. 2017; 284(1):228-43. 2. Aortic Atherosclerosis (ICD10-I70.0) and Emphysema (ICD10-J43.9). Electronically Signed   By: Randa Ngo M.D.   On: 11/13/2022 23:04    MR BRAIN WO CONTRAST   Result Date: 11/13/2022 CLINICAL DATA:  Acute neurologic deficit EXAM: MRI HEAD WITHOUT CONTRAST TECHNIQUE: Multiplanar, multiecho pulse sequences of the brain and surrounding structures were obtained without intravenous contrast. COMPARISON:  12/30/2015 FINDINGS: Brain: Small area of abnormal diffusion restriction within the right corona radiata. Chronic right subdural hematoma. No acute hemorrhage. There is multifocal hyperintense T2-weighted signal within the white matter. Generalized volume loss. The midline structures are normal. Vascular: Major flow voids are preserved. Skull and upper cervical spine: Normal calvarium and skull base. Visualized upper cervical spine and soft tissues are normal. Sinuses/Orbits:No paranasal sinus fluid levels  or advanced mucosal thickening. No mastoid or middle ear effusion. Normal orbits. IMPRESSION: 1. Small area of acute or early subacute ischemia within the right corona radiata. 2. Chronic right subdural hematoma. Electronically Signed   By: Ulyses Jarred M.D.   On: 11/13/2022 20:48    CT ANGIO HEAD NECK W WO CM (CODE STROKE)   Result Date: 11/13/2022 CLINICAL DATA:  Neuro deficit, acute, stroke suspected. Left arm numbness and weakness. EXAM: CT ANGIOGRAPHY HEAD AND NECK TECHNIQUE: Multidetector CT imaging of the head and neck was performed using the standard protocol during bolus administration of intravenous contrast. Multiplanar CT image reconstructions and MIPs were obtained to evaluate the vascular anatomy. Carotid stenosis measurements (when applicable) are obtained utilizing NASCET criteria, using the distal internal carotid diameter as the denominator. RADIATION DOSE REDUCTION: This exam was performed according to the departmental dose-optimization program which includes automated exposure control, adjustment of the mA and/or kV according to patient size and/or use of iterative reconstruction technique. CONTRAST:  77m OMNIPAQUE IOHEXOL 350 MG/ML SOLN COMPARISON:  Head CT same day FINDINGS: CTA NECK FINDINGS Aortic arch: Aortic atherosclerosis. Branching pattern is normal without origin stenosis. Right carotid system: Common carotid artery widely patent to the bifurcation. Mild calcified plaque at the carotid bifurcation but no stenosis. Cervical ICA widely patent. Left carotid system: Common carotid artery widely patent to the bifurcation. Carotid bifurcation is normal. Cervical ICA is normal. Vertebral arteries: Both vertebral artery origins are widely patent. Both vertebral arteries appear normal through the cervical region to the foramen magnum. Skeleton: Chronic cervical spondylosis.  No acute finding. Other neck: No mass or lymphadenopathy. Upper  chest: Emphysema and pulmonary scarring. Old rib  fractures on the right. 11 mm nodule in the medial right upper lobe. This is new since 2017 and is worrisome for a possible lung cancer. Review of the MIP images confirms the above findings CTA HEAD FINDINGS Anterior circulation: Both internal carotid arteries are patent through the skull base and siphon regions. Ordinary siphon atherosclerosis. Carotid artery stent in the distal siphon to supraclinoid ICA on the right. No evidence of residual aneurysmal flow. The stent is patent. Flow is present in the anterior and middle cerebral arteries. No large vessel occlusion. Posterior circulation: Both vertebral arteries are patent through the foramen magnum to the basilar artery. No basilar stenosis. Superior cerebellar arteries and posterior cerebral arteries appear normal. Venous sinuses: Patent and normal. Anatomic variants: None significant. Review of the MIP images confirms the above findings IMPRESSION: 1. No acute large vessel occlusion. 2. Aortic atherosclerosis. 3. Mild atherosclerotic change at the right carotid bifurcation but no stenosis. 4. Right ICA stent in the distal siphon to supraclinoid ICA. No evidence of residual aneurysmal flow. 5. 11 mm nodule in the medial right upper lobe. This is new since 2017 and is worrisome for a possible lung cancer. Consider one of the following in 3 months for both low-risk and high-risk individuals: (a) repeat chest CT, (b) follow-up PET-CT, or (c) tissue sampling. This recommendation follows the consensus statement: Guidelines for Management of Incidental Pulmonary Nodules Detected on CT Images: From the Fleischner Society 2017; Radiology 2017; 284:228-243. Because of the presence of emphysema and pulmonary scarring, this patient is certainly high risk. Because this was not present in 2017, I think proceeding to PET scan or tissue sampling could be preferable. Aortic Atherosclerosis (ICD10-I70.0). Electronically Signed   By: Nelson Chimes M.D.   On: 11/13/2022 18:59     CT HEAD CODE STROKE WO CONTRAST   Result Date: 11/13/2022 CLINICAL DATA:  Code stroke.  Neuro deficit, acute, stroke suspected EXAM: CT HEAD WITHOUT CONTRAST TECHNIQUE: Contiguous axial images were obtained from the base of the skull through the vertex without intravenous contrast. RADIATION DOSE REDUCTION: This exam was performed according to the departmental dose-optimization program which includes automated exposure control, adjustment of the mA and/or kV according to patient size and/or use of iterative reconstruction technique. COMPARISON:  CT head January 07, 2019. FINDINGS: Brain: Mild increase in thickness of a right subdural low-attenuation collection, measuring approximately 1.1 cm in thickness. Suspected trace left subdural low-attenuation fluid collection is unchanged. No evidence of acute large vascular territory infarct, or definitely acute hemorrhage. Mild associated mass effect without midline shift. Vascular: No hyperdense vessel identified. Right ICA stent. Calcific atherosclerosis. Skull: No acute fracture. Sinuses/Orbits: Fluid in left maxillary sinus. No acute orbital findings. Other: No mastoid effusions. ASPECTS [facility] Stroke Program Early CT Score) total score (0-10 with 10 being normal): 10. IMPRESSION: 1. Mild increase in thickness of a right subdural low-attenuation collection, measuring approximately 1.1 cm in thickness. Suspected trace left subdural low-attenuation fluid collection is unchanged. 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 Mild associated mass effect without midline shift. 2. Otherwise, no evidence of acute intracranial abnormality. Code stroke imaging results were communicated on 11/13/2022 at 6:38 pm to provider Baghat via telephone, who verbally acknowledged these results. Electronically Signed   By: Margaretha Sheffield M.D.   On: 11/13/2022 18:40           Blood pressure 136/72, pulse 65, temperature 98.9 F (37.2 C),  temperature source Oral, resp.  rate 19, height 5' 9"$  (1.753 m), weight 81.1 kg, SpO2 95 %.   Medical Problem List and Plan: 1. 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  Antithrombotics: -DVT/anticoagulation:  Pharmaceutical: Lovenox             -antiplatelet therapy: aspirin and Plavix for three weeks followed by Plavix alone (stop aspirin 3/14)   3. Pain Management: Tylenol as needed   4. Mood/Behavior/Sleep: LCSW to evaluate and provide emotional support             -antipsychotic agents: n/a   5. Neuropsych/cognition: This patient is capable of making decisions on his own behalf.   6. Skin/Wound Care: Routine skin care checks   7. Fluids/Electrolytes/Nutrition: Routine Is and Os and follow-up chemistries   8: Hypertension: monitor TID and prn (home Norvasc held)      11/15/2022    8:55 PM 11/15/2022    7:43 PM 11/15/2022    4:30 PM  Vitals with BMI  Height 5' 9"$     Weight 180 lbs 5 oz    BMI 123456    Systolic [id number]  Diastolic 76 79 79  Pulse 65 71 61    9: Hyperlipidemia: continue statin: now on high-intensity>>Lipitor 80 mg daily)   10: BPH: continue Proscar 5 mg daily (home Flomax not restarted)   11: Prior history of stroke with seizure activity: follows with Dr. 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 I am in agreement with the above recommendations.   Gertie Gowda, DO 11/15/2022

## 2022-11-15 NOTE — PMR Pre-admission (Signed)
PMR Admission Coordinator Pre-Admission Assessment  Patient: Damon Parrish is an 82 y.o., male MRN: [id number] DOB: [birth year] Height: '5\' 9"'$  (175.3 cm) Weight: 81.1 kg  Insurance Information HMO:     PPO:      PCP:      IPA:      80/20:      OTHER:  PRIMARY: Medicare A/B      Policy#: [id number]      Subscriber: pt CM Name:       Phone#:      Fax#:  Pre-Cert#: verified Civil engineer, contracting:  Benefits:  Phone #:      Name:  Eff. Date: 11/21/05 A/B     Deduct: $1632      Out of Pocket Max: n/a      Life Max: n/a CIR: 100%      SNF: 20 full days Outpatient: 80%     Co-Pay: 20% Home Health: 100%      Co-Pay:  DME: 80%     Co-Pay: 20% Providers:  [employer] [place]      Policy#: [id number]     Phone#: [phone]  Financial Counselor:       Phone#:   The "Data Collection Information Summary" for patients in Inpatient Rehabilitation Facilities with attached "Privacy Act [place] Records" was provided and verbally reviewed with: Patient and Family  Emergency Contact Information Contact Information     Name Relation Home Work Mobile   Tees Toh Spouse [phone]  [phone]   Tilda Franco  [phone]   Antavion, [place]   [phone]       Current Medical History  Patient Admitting Diagnosis: CVA  History of Present Illness: Pt is a 82 y/o male with PMH of R MCA stoke, epidural abscess, cerebral aneurysm repair (2016), chronic SD fluid collections, HTN, seizures, glaucoma and chronic LBP, who presented to [facility] on 11/13/22 with c/o sudden onset L hemiparesis and L facial droop.  Blood pressure elevated with SBP in 150s, chronically low Na (131 in ED).  Stroke team consulted and workup initiated.  CT head negative, CTA showed no large vessel occlusion.  MRI showed small area of ischemia within the right corona radiata.  Loop recorder was recommended.  CTA head/neck showed incidental 11 mm nodule in the medial RUL, CT chest showed 13 mm  suspicious right solid pulmonary nodule.  Recommend OP oncology follow up.  Therapy evaluations completed and pt was recommended for CIR.   Complete NIHSS TOTAL: 0  Patient's medical record from [facility] has been reviewed by the rehabilitation admission coordinator and physician.  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 No  Family History   family history includes Aneurysm  in his father; Dementia in his mother; Lymphoma in his mother.  Current Medications  Current Facility-Administered Medications:    acetaminophen (TYLENOL) tablet 650 mg, 650 mg, Oral, Q6H PRN **OR** acetaminophen (TYLENOL) 160 MG/5ML solution 650 mg, 650 mg, Per Tube, Q6H PRN **OR** acetaminophen (TYLENOL) suppository 650 mg, 650 mg, Rectal, Q6H PRN, Shela Leff, MD   aspirin EC tablet 81 mg, 81 mg, Oral, Daily, Lehner, Erin C, NP, 81 mg at 11/15/22 0936   atorvastatin (LIPITOR) tablet 80 mg, 80 mg, Oral, QHS, Rosalin Hawking, MD, 80 mg at 11/14/22 2147   clopidogrel (PLAVIX) tablet 75 mg, 75 mg, Oral, Daily, Thressa Sheller, Erin C, NP, 75 mg at 11/15/22 0936   enoxaparin (LOVENOX) injection 40 mg, 40 mg, Subcutaneous, Q24H, Rosalin Hawking, MD, 40 mg at 11/14/22 1837   finasteride (PROSCAR) tablet 5 mg, 5 mg, Oral, Daily, Alekh, Kshitiz, MD, 5 mg at 11/15/22  0936   ipratropium-albuterol (DUONEB) 0.5-2.5 (3) MG/3ML nebulizer solution 3 mL, 3 mL, Nebulization, Q6H PRN, Starla Link, Kshitiz, MD   levETIRAcetam (KEPPRA) tablet 500 mg, 500 mg, Oral, BID, Starla Link, Kshitiz, MD, 500 mg at 11/15/22 [id number]  Patients Current Diet:  Diet Order             Diet Heart Room service appropriate? Yes; Fluid consistency: Thin  Diet effective now                   Precautions / Restrictions Precautions Precautions: Fall Precaution Comments: fell 10/03/22 and hasn't really returned to baseline Restrictions Weight Bearing Restrictions: No Other Position/Activity Restrictions: resting R tremor at baseline   Has the patient had 2 or more falls or a fall with injury in the past year? Yes  Prior Activity Level Community (5-7x/wk): fully independent with mobility and adls, using cane in community, no DME in the house, driving, wife assists with med mgmt and finances  Prior Functional Level Self Care: Did the patient need help bathing, dressing, using the toilet or eating? Independent  Indoor Mobility: Did the patient need assistance with walking from room to room (with or without device)? Independent  Stairs: Did the patient need assistance with internal or external stairs (with or without device)? Independent  Functional Cognition: Did the patient need help planning regular tasks such as shopping or remembering to take medications? Needed some help  Patient Information Are you of Hispanic, Latino/a,or Spanish origin?: A. No, not of Hispanic, Latino/a, or Spanish origin What is your race?: A. White Do you need or want an interpreter to communicate with a doctor or health care staff?: 0. No  Patient's Response To:  Health Literacy and Transportation Is the patient able to respond to health literacy and transportation needs?: Yes Health Literacy - How often do you need to have someone help you when you read instructions, pamphlets, or other written material from  your doctor or pharmacy?: Never In the past 12 months, has lack of transportation kept you from medical appointments or from getting medications?: No In the past 12 months, has lack of transportation kept you from meetings, work, or from getting things needed for daily living?: No  Home Assistive Devices / [place] Devices/Equipment: Environmental consultant (specify type), Wheelchair, Radio producer (specify quad or straight), Blood pressure cuff Home Equipment: Conservation officer, nature (2 wheels), [employer] - single point, [employer] - built in, [employer] - tub/shower, Wheelchair - manual  Prior Device Use: Indicate devices/aids used by the patient prior to current illness, exacerbation or injury?  Cane in community  Current Functional Level Cognition  Overall Cognitive Status: Impaired/Different from baseline Orientation Level: Oriented X4 Safety/Judgement: Decreased awareness of safety, Decreased awareness of deficits General Comments: pt alert and oriented. requires increased time to follow commands with decreased problem solving.  Decreaed awareness and insight to deficits.    Extremity Assessment (includes Sensation/Coordination)  Upper Extremity Assessment: LUE deficits/detail LUE Deficits / Details: mild decreased strength and coordination. LUE Sensation: decreased proprioception LUE Coordination: decreased fine motor, decreased gross motor  Lower Extremity Assessment: Defer to PT evaluation LLE Deficits / Details: hip flex 4/5, knee ext 4/5 but pt with decreased proprioception and neuromuscular control of LLE with mobility LLE Sensation: decreased proprioception LLE Coordination: decreased gross motor, decreased fine motor    ADLs  Overall ADL's : Needs assistance/impaired Grooming: Minimal assistance, Standing Upper Body Dressing : Minimal assistance, Sitting Lower Body Dressing: Sit to/from stand, Moderate assistance Toilet Transfer: Minimal assistance, Ambulation, Rolling walker (2  wheels) Functional mobility during ADLs: Minimal assistance, Rolling walker (2 wheels) General ADL Comments: +2 safety for functional mobiltiy    Mobility  Overal bed mobility: Needs Assistance Bed Mobility: Supine to Sit Supine to sit: Supervision General bed mobility comments: OOB upon entry    Transfers  Overall transfer level: Needs assistance Equipment used: Rolling walker (2 wheels) Transfers: Sit to/from Stand Sit to Stand: Min assist General transfer comment: min A to steady on L side.    Ambulation / Gait / Stairs / Wheelchair Mobility  Ambulation/Gait Ambulation/Gait assistance: +2 safety/equipment, Min assist Gait Distance (Feet): 60 Feet Assistive device: Rolling walker (2 wheels) Gait Pattern/deviations: Step-through pattern, Decreased step length - left, Decreased stance time - left General Gait Details: pt with ER L hip with difficulty keeping LLE within RW which is decreasing his safety with ambulation. He is not correcting this on his own but needing verbal and occasional tactile cues to correct. Having increased difficulty with turning. Gait velocity: decreased Gait velocity interpretation: <1.8 ft/sec, indicate of risk for recurrent falls    Posture / Balance Dynamic Sitting Balance Sitting balance - Comments: mild L lean, no LOB EOB Balance Overall balance assessment: Needs assistance, History of Falls Sitting-balance support: Feet supported, No upper extremity supported Sitting balance-Leahy Scale: Fair Sitting balance - Comments: mild L lean, no LOB EOB Postural control: Left lateral lean Standing balance support: Bilateral upper extremity supported, During functional activity Standing balance-Leahy Scale: Poor Standing balance comment: L lean in standing with decreased control LLE. Needs min A for safety    Special needs/care consideration N/a   Previous Home Environment (from acute therapy documentation) Living Arrangements: Spouse/significant  other Available Help at Discharge: Family, Available 24 hours/day Type of Home: House Home Layout: One level Home Access: Stairs to enter Entrance Stairs-Rails: None Entrance Stairs-Number of Steps: 1 Bathroom Shower/Tub: Multimedia programmer: Standard Home Care Services: No Additional Comments: retired from working at newspaper in [place] for Discharge Living Setting: Patient's home, Lives with (comment) (spouse) Type of Home at Discharge: House Discharge [place]: One level Discharge Home Access: Stairs to enter Entrance Stairs-Rails: None Entrance Stairs-Number of Steps: 1 Discharge Bathroom Shower/Tub: Walk-in shower Discharge Bathroom Toilet: Standard Discharge Bathroom Accessibility: Yes How Accessible: Accessible via walker Does the patient have any problems obtaining your medications?: No  Social/Family/Support Systems Patient Roles: Spouse Anticipated Caregiver: wife, Aedon Percell Anticipated Caregiver's Contact Information: [phone] Ability/Limitations of Caregiver: supervision mobility, min assist ADLs Caregiver Availability: 24/7 Discharge Plan Discussed with Primary Caregiver: Yes Is  Caregiver In Agreement with Plan?: Yes Does Caregiver/Family have Issues with Lodging/Transportation while Pt is in Rehab?: No  Goals Patient/Family Goal for Rehab: PT/OT/SLP supervision to mod I Expected length of stay: 9-12 days Additional Information: Dispo: home with spouse providing 24/7, can provide supervision for mobility and up to min assist for ADLs. Pt/Family Agrees to Admission and willing to participate: Yes Program Orientation Provided & Reviewed with Pt/Caregiver Including Roles  & Responsibilities: Yes Additional Information Needs: spouse has questions regarding administration of eye drops for glaucoma, referred her to care coordinator RN  Decrease burden of Care through IP rehab admission: n/a  Possible need for  SNF placement upon discharge: Not anticipated.  Plan discharge home with spouse providing up to 24/7 supervision/assist   Patient Condition: I have reviewed medical records from [facility], spoken with  [facility] team , and patient and spouse. I met with patient at the bedside for inpatient rehabilitation assessment.  Patient will benefit from ongoing PT, OT, and SLP, can actively participate in 3 hours of therapy a day 5 days of the week, and can make measurable gains during the admission.  Patient will also benefit from the coordinated team approach during an Inpatient Acute Rehabilitation admission.  The patient will receive intensive therapy as well as Rehabilitation physician, nursing, social worker, and care management interventions.  Due to safety, skin/wound care, disease management, medication administration, pain management, and patient education the patient requires 24 hour a day rehabilitation nursing.  The patient is currently min assist with mobility and basic ADLs.  Discharge setting and therapy post discharge at home with home health is anticipated.  Patient has agreed to participate in the Acute Inpatient Rehabilitation Program and will admit today.  Preadmission Screen Completed By:  Michel Santee, PT, DPT 11/15/2022 10:43 AM ______________________________________________________________________   Discussed status with Dr. Tressa Busman on 11/15/22  at 10:53 AM  and received approval for admission today.  Admission Coordinator:  Michel Santee, PT, DPT time 10:53 AM Sudie Grumbling 11/15/22    Assessment/Plan: Diagnosis: Does the need for close, 24 hr/day Medical supervision in concert with the patient's rehab needs make it unreasonable for this patient to be served in a less intensive setting? Yes Co-Morbidities requiring supervision/potential complications: R ischemic MCA/ACA stroke, seizures, hypertension, hyponatremia, 30+ pack year smoking Hx with pulmonary nodule, constipation Due to bladder  management, bowel management, safety, skin/wound care, disease management, medication administration, and patient education, does the patient require 24 hr/day rehab nursing? Yes Does the patient require coordinated care of a physician, rehab nurse, PT, OT, and SLP to address physical and functional deficits in the context of the above medical diagnosis(es)? Yes Addressing deficits in the following areas: balance, endurance, locomotion, strength, transferring, bowel/bladder control, bathing, dressing, feeding, grooming, toileting, and cognition Can the patient actively participate in an intensive therapy program of at least 3 hrs of therapy 5 days a week? Yes The potential for patient to make measurable gains while on inpatient rehab is excellent Anticipated functional outcomes upon discharge from inpatient rehab: supervision PT, supervision OT, supervision SLP Estimated rehab length of stay to reach the above functional goals is: 10-12 days Anticipated discharge destination: Home 10. Overall Rehab/Functional Prognosis: excellent   MD Signature:  Gertie Gowda, DO 11/15/2022

## 2022-11-15 NOTE — Progress Notes (Signed)
Inpatient Rehab Coordinator Note:  I met with patient and his spouse, [name], at bedside to discuss CIR recommendations and goals/expectations of CIR stay.  We reviewed 3 hrs/day of therapy, physician follow up, and average length of stay 2 weeks (dependent upon progress) with goals of supervision to mod I.  [name] confirms that pt will have 24/7 support at discharge and they are hopeful for maximizing independence at home.  We discussed medicare benefits.  I do have a bed available today and it appears pt is ready.  Dr. Starla Link and Dr. Erlinda Hong in agreement. I will let pt/family and TOC team know.   Shann Medal, PT, DPT Admissions Coordinator [phone] 11/15/22  10:43 AM

## 2022-11-15 NOTE — Progress Notes (Addendum)
Inpatient Rehabilitation Admission Medication Review by a Pharmacist  A complete drug regimen review was completed for this patient to identify any potential clinically significant medication issues.  High Risk Drug Classes Is patient taking? Indication by Medication  Antipsychotic Yes Compazine prn N/V  Anticoagulant Yes Lovenox - VTE prophylaxis  Antibiotic No   Opioid No   Antiplatelet Yes Plavix/aspirin x 3 weeks then Plavix alone  Hypoglycemics/insulin No   Vasoactive Medication Yes Finasteride - BPH  Chemotherapy No   Other Yes Methocarbamol prn spasms Trazodone prn sleep Atorvastatin - HLD Keppra - seizure  Breo, Incruse inhalers - COPD  Alphagan, Azopt, Prednisolone, Timolol, Xalatan- glaucoma     Type of Medication Issue Identified Description of Issue Recommendation(s)  Drug Interaction(s) (clinically significant)     Duplicate Therapy     Allergy     No Medication Administration End Date     Incorrect Dose     Additional Drug Therapy Needed     Significant med changes from prior encounter (inform family/care partners about these prior to discharge). Amlodipine - on hold   Other       Clinically significant medication issues were identified that warrant physician communication and completion of prescribed/recommended actions by midnight of the next day:  No  Pharmacist comments: None  Time spent performing this drug regimen review (minutes):  20 minutes  Thank you Anette Guarneri, PharmD

## 2022-11-15 NOTE — Progress Notes (Signed)
STROKE TEAM PROGRESS NOTE   INTERVAL HISTORY His wife is at the bedside. Pt had episode last night of left hand jerking, semi-rhythmic, no mental status change. Received ativan and early dose of night keppra. Movement resolved. Subsequent LTM EEG no seizure. Per wife, pt overnight agitated probably from ativan paradoxical effect. Recommend Loop Recorder on discharge.  Vitals:   11/14/22 2026 11/14/22 2326 11/15/22 0345 11/15/22 0812  BP: (!) 117/97 126/74 139/76 136/72  Pulse: 67 65 64 65  Resp: 18 18 16 19  $ Temp: 99.2 F (37.3 C) 99.1 F (37.3 C) 98.9 F (37.2 C) 98.9 F (37.2 C)  TempSrc: Axillary Axillary Axillary Oral  SpO2: 94% 93% 96% 95%  Weight:      Height:       CBC:  Recent Labs  Lab 11/13/22 1820 11/13/22 1829  WBC 9.7  --   NEUTROABS 5.6  --   HGB 15.3 15.3  HCT 43.6 45.0  MCV 98.2  --   PLT 254  --    Basic Metabolic Panel:  Recent Labs  Lab 11/13/22 1820 11/13/22 1829  NA 131* 132*  K 4.3 4.3  CL 98 100  CO2 22  --   GLUCOSE 130* 125*  BUN 18 22  CREATININE 1.01 1.00  CALCIUM 9.5  --    Lipid Panel:  Recent Labs  Lab 11/14/22 0018  CHOL 146  TRIG 96  HDL 46  CHOLHDL 3.2  VLDL 19  LDLCALC 81   HgbA1c:  Recent Labs  Lab 11/13/22 1820  HGBA1C 6.1*   Urine Drug Screen:  Recent Labs  Lab 11/13/22 1937  LABOPIA NONE DETECTED  COCAINSCRNUR NONE DETECTED  LABBENZ NONE DETECTED  AMPHETMU NONE DETECTED  THCU NONE DETECTED  LABBARB NONE DETECTED    Alcohol Level  Recent Labs  Lab 11/13/22 1830  ETH <10    IMAGING past 24 hours Overnight EEG with video  Result Date: 11/15/2022 Lora Havens, MD     11/15/2022  9:43 AM Patient Name: Damon Parrish MRN: [id number] Epilepsy Attending: Lora Havens Referring Physician/Provider: Rosalin Hawking, MD Duration: 11/14/2022 2314 to 11/15/2022 0930 Patient history: 82yo M with left hand shaking jerking spasm for the last 10mn. EEG to evaluate for seizure Level of alertness: Awake, asleep  AEDs during EEG study: LEV Technical aspects: This EEG study was done with scalp electrodes positioned according to the 10-20 International system of electrode placement. Electrical activity was reviewed with band pass filter of 1-70Hz$ , sensitivity of 7 uV/mm, display speed of 368msec with a 60Hz$  notched filter applied as appropriate. EEG data were recorded continuously and digitally stored.  Video monitoring was available and reviewed as appropriate. Description: The posterior dominant rhythm consists of 9-10 Hz activity of moderate voltage (25-35 uV) seen predominantly in posterior head regions, symmetric and reactive to eye opening and eye closing. Sleep was characterized by vertex waves, sleep spindles (12 to 14 Hz), maximal frontocentral region. EEG showed intermittent 3 to 6 Hz theta-delta slowing in right hemisphere. Generalized low amplitude 12-15hz$  beta activity was also noted. Hyperventilation and photic stimulation were not performed.   ABNORMALITY - Intermittent slow, right hemisphere IMPRESSION: This study is suggestive of cortical dysfunction arising from right hemisphere likely secondary to underlying structural abnormality. No seizures or epileptiform discharges were seen throughout the recording. Please note lack of epileptiform activity during interictal EEG does not exclude the diagnosis of epilepsy. PrLora Havens ECHOCARDIOGRAM COMPLETE  Result Date: 11/14/2022  ECHOCARDIOGRAM REPORT   Patient Name:   Damon Parrish Date of Exam: 11/14/2022 Medical Rec #:  [id number]     Height:       69.0 in Accession #:    [id number]    Weight:       178.8 lb Date of Birth:  1941-05-16      BSA:          1.970 m Patient Age:    82 years      BP:           130/74 mmHg Patient Gender: M             HR:           67 bpm. Exam Location:  Inpatient Procedure: 2D Echo, Cardiac Doppler and Color Doppler Indications:    R55 Syncope  History:        Patient has no prior history of Echocardiogram examinations.                  Stroke; Risk Factors:Current Smoker and Hypertension.  Sonographer:    Wilkie Aye RVT RCS Referring Phys: [id number] [facility]  Sonographer Comments: No parasternal window, suboptimal subcostal window, Technically challenging study due to limited acoustic windows and Technically difficult study due to poor echo windows. IMPRESSIONS  1. Left ventricular ejection fraction, by estimation, is 60 to 65%. Left ventricular ejection fraction by 3D volume is 64 %. The left ventricle has normal function. The left ventricle has no regional wall motion abnormalities. There is mild left ventricular hypertrophy. Left ventricular diastolic parameters are consistent with Grade I diastolic dysfunction (impaired relaxation).  2. Right ventricular systolic function is normal. The right ventricular size is mildly enlarged. There is normal pulmonary artery systolic pressure. The estimated right ventricular systolic pressure is 99991111 mmHg.  3. The mitral valve is normal in structure. No evidence of mitral valve regurgitation. No evidence of mitral stenosis.  4. The aortic valve was not well visualized. Aortic valve regurgitation is not visualized. No aortic stenosis is present.  5. The inferior vena cava is normal in size with <50% respiratory variability, suggesting right atrial pressure of 8 mmHg. FINDINGS  Left Ventricle: Left ventricular ejection fraction, by estimation, is 60 to 65%. Left ventricular ejection fraction by 3D volume is 64 %. The left ventricle has normal function. The left ventricle has no regional wall motion abnormalities. The left ventricular internal cavity size was normal in size. There is mild left ventricular hypertrophy. Left ventricular diastolic parameters are consistent with Grade I diastolic dysfunction (impaired relaxation). Right Ventricle: The right ventricular size is mildly enlarged. No increase in right ventricular wall thickness. Right ventricular systolic function is normal. There  is normal pulmonary artery systolic pressure. The tricuspid regurgitant velocity is 1.89  m/s, and with an assumed right atrial pressure of 8 mmHg, the estimated right ventricular systolic pressure is 99991111 mmHg. Left [facility]: Left atrial size was normal in size. Right [facility]: Right atrial size was normal in size. Prominent Eustachian valve. Pericardium: There is no evidence of pericardial effusion. Presence of epicardial fat layer. Mitral Valve: The mitral valve is normal in structure. No evidence of mitral valve regurgitation. No evidence of mitral valve stenosis. Tricuspid Valve: The tricuspid valve is normal in structure. Tricuspid valve regurgitation is trivial. No evidence of tricuspid stenosis. Aortic Valve: The aortic valve was not well visualized. Aortic valve regurgitation is not visualized. No aortic stenosis is present. Aortic valve mean gradient measures 3.0 mmHg. Aortic  valve peak gradient measures 6.0 mmHg. Aortic valve area, by VTI measures 2.47 cm. Pulmonic Valve: The pulmonic valve was not well visualized. Aorta: The aortic root is normal in size and structure. Venous: The inferior vena cava is normal in size with less than 50% respiratory variability, suggesting right atrial pressure of 8 mmHg. IAS/Shunts: No atrial level shunt detected by color flow Doppler.  LEFT VENTRICLE PLAX 2D LVIDd:         4.10 cm         Diastology LVIDs:         3.20 cm         LV e' medial:    6.48 cm/s LV PW:         1.10 cm         LV E/e' medial:  9.7 LV IVS:        1.20 cm         LV e' lateral:   7.42 cm/s LVOT diam:     1.80 cm         LV E/e' lateral: 8.4 LV SV:         58 LV SV Index:   29 LVOT Area:     2.54 cm        3D Volume EF                                LV 3D EF:    Left                                             ventricul                                             ar                                             ejection                                             fraction                                              by 3D                                             volume is                                             64 %.  3D Volume EF:                                3D EF:        64 %                                LV EDV:       109 ml                                LV ESV:       39 ml                                LV SV:        70 ml RIGHT VENTRICLE             IVC RV Basal diam:  3.30 cm     IVC diam: 1.90 cm RV S prime:     12.80 cm/s TAPSE (M-mode): 2.0 cm LEFT ATRIUM             Index        RIGHT ATRIUM           Index LA diam:        3.40 cm 1.73 cm/m   RA Area:     11.60 cm LA Vol (A2C):   49.2 ml 24.98 ml/m  RA Volume:   22.00 ml  11.17 ml/m LA Vol (A4C):   32.3 ml 16.40 ml/m LA Biplane Vol: 42.8 ml 21.73 ml/m  AORTIC VALVE AV Area (Vmax):    2.73 cm AV Area (Vmean):   2.52 cm AV Area (VTI):     2.47 cm AV Vmax:           122.00 cm/s AV Vmean:          84.000 cm/s AV VTI:            0.235 m AV Peak Grad:      6.0 mmHg AV Mean Grad:      3.0 mmHg LVOT Vmax:         131.00 cm/s LVOT Vmean:        83.100 cm/s LVOT VTI:          0.228 m LVOT/AV VTI ratio: 0.97  AORTA Ao Root diam: 3.30 cm MITRAL VALVE               TRICUSPID VALVE MV Area (PHT): 3.76 cm    TR Peak grad:   14.3 mmHg MV Decel Time: 202 msec    TR Vmax:        189.00 cm/s MV E velocity: 62.60 cm/s MV A velocity: 84.00 cm/s  SHUNTS MV E/A ratio:  0.75        Systemic VTI:  0.23 m                            Systemic Diam: 1.80 cm Cherlynn Kaiser MD Electronically signed by Cherlynn Kaiser MD Signature Date/Time: 11/14/2022/9:11:28 PM    Final    VAS [place] LOWER EXTREMITY VENOUS (DVT)  Result Date: 11/14/2022  Lower Venous DVT Study Patient Name:  Damon Parrish  Date of Exam:   11/14/2022  Medical Rec #: [id number]      Accession #:    [id number] Date of Birth: September 21, 1941       Patient Gender: M Patient Age:   66 years Exam Location:  [facility] Procedure:      VAS [place] LOWER EXTREMITY VENOUS (DVT)  Referring Phys: Damon Parrish --------------------------------------------------------------------------------  Indications: Stroke.  Risk Factors: None identified. Comparison Study: No prior studies. Performing Technologist: Oliver Hum RVT  Examination Guidelines: A complete evaluation includes B-mode imaging, spectral Doppler, color Doppler, and power Doppler as needed of all accessible portions of each vessel. Bilateral testing is considered an integral part of a complete examination. Limited examinations for reoccurring indications may be performed as noted. The reflux portion of the exam is performed with the patient in reverse Trendelenburg.  +---------+---------------+---------+-----------+----------+--------------+ RIGHT    CompressibilityPhasicitySpontaneityPropertiesThrombus Aging +---------+---------------+---------+-----------+----------+--------------+ CFV      Full           Yes      Yes                                 +---------+---------------+---------+-----------+----------+--------------+ SFJ      Full                                                        +---------+---------------+---------+-----------+----------+--------------+ FV Prox  Full                                                        +---------+---------------+---------+-----------+----------+--------------+ FV Mid   Full                                                        +---------+---------------+---------+-----------+----------+--------------+ FV DistalFull                                                        +---------+---------------+---------+-----------+----------+--------------+ PFV      Full                                                        +---------+---------------+---------+-----------+----------+--------------+ POP      Full           Yes      Yes                                  +---------+---------------+---------+-----------+----------+--------------+ PTV      Full                                                        +---------+---------------+---------+-----------+----------+--------------+  PERO     Full                                                        +---------+---------------+---------+-----------+----------+--------------+   +---------+---------------+---------+-----------+----------+--------------+ LEFT     CompressibilityPhasicitySpontaneityPropertiesThrombus Aging +---------+---------------+---------+-----------+----------+--------------+ CFV      Full           Yes      Yes                                 +---------+---------------+---------+-----------+----------+--------------+ SFJ      Full                                                        +---------+---------------+---------+-----------+----------+--------------+ FV Prox  Full                                                        +---------+---------------+---------+-----------+----------+--------------+ FV Mid   Full                                                        +---------+---------------+---------+-----------+----------+--------------+ FV DistalFull                                                        +---------+---------------+---------+-----------+----------+--------------+ PFV      Full                                                        +---------+---------------+---------+-----------+----------+--------------+ POP      Full           Yes      Yes                                 +---------+---------------+---------+-----------+----------+--------------+ PTV      Full                                                        +---------+---------------+---------+-----------+----------+--------------+ PERO     Full                                                         +---------+---------------+---------+-----------+----------+--------------+  Summary: RIGHT: - There is no evidence of deep vein thrombosis in the lower extremity.  - No cystic structure found in the popliteal fossa.  LEFT: - There is no evidence of deep vein thrombosis in the lower extremity.  - No cystic structure found in the popliteal fossa.  *See table(s) above for measurements and observations. Electronically signed by Monica Martinez MD on 11/14/2022 at 2:26:37 PM.    Final     PHYSICAL EXAM  Temp:  [98.1 F (36.7 C)-99.2 F (37.3 C)] 98.9 F (37.2 C) (02/23 0812) Pulse Rate:  [64-73] 65 (02/23 0812) Resp:  [16-19] 19 (02/23 0812) BP: (117-139)/(72-97) 136/72 (02/23 0812) SpO2:  [92 %-98 %] 95 % (02/23 0812)  General - Well nourished, well developed, in no apparent distress. Cardiovascular - Regular rhythm and rate. Pulmonary - Unlabored breathing, no supplemental oxygen needed  Mental Status -  Level of arousal and orientation to time, place, and person were intact. Language including expression, naming, repetition, comprehension was assessed and found intact.  Cranial Nerves II - XII - II - Visual field intact OU. III, IV, VI - Extraocular movements intact. V - Facial sensation intact bilaterally. VII - Facial movement intact bilaterally. VIII - Hearing & vestibular intact bilaterally. X - Palate elevates symmetrically. XI - Chin turning & shoulder shrug intact bilaterally. XII - Tongue protrusion intact.  Motor Strength - The patient's strength was normal in RUE and RLE. No more pronator drift on the left    Motor Tone - Muscle tone was assessed at the neck and appendages and was normal.  Sensory - Light touch  assessed and symmetrical.    Coordination - The patient had normal movements in the bilateral FTN today.   Gait and Station - deferred.   ASSESSMENT/PLAN Damon Parrish is a 82 y.o. 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   History of both stroke and seizures. Seen 12/2015 at [facility]:  MRI showed patchy acute ischemia 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 He recovered well and had equal strength in all extremities noted at his last neurology appointment in 2018. Developed seizures at this same timeframe and was put on Keppra, then titrated off in 2018. However, in 2020 he was seen for abnormal left lower extremity movements that were thought to be focal seizures and his Keppra was resumed at 500 mg twice daily. He states he has been consistent with this medication but wife said he is only taking keppra 579m daily at home.   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 CTA head & neck: No acute large vessel occlusion. Mild atherosclerotic change at the right carotid bifurcation but no stenosis. Right ICA stent in the distal siphon to supraclinoid ICA. No evidence of residual aneurysmal flow. MRI: Small area of acute or early subacute ischemia within the right corona radiata. 2D Echo EF 60-65% [place] venous doppler: Negative for DVT Recommend loop recorder prior to dicharge LDL 81 HgbA1c 6.1 VTE prophylaxis - lovenox aspirin 325 mg daily prior to admission, Now on aspirin 81 mg daily and clopidogrel 75 mg daily. Recommend Aspirin and Plavix x3weeks, then plavix alone.  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 Suspected trace left subdural  low-attenuation fluid collection is unchanged. 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 Mild associated mass effect without midline shift. MRI Chronic right subdural hematoma Continue to observe  Lung nodule  CTA neck - 11 mm nodule in the medial right upper lobe. This is new since 2017 and is worrisome for a possible lung cancer CT chest 13 mm suspicious right solid pulmonary nodule within the upper lobe. If the patient would be a therapy candidate should neoplasm be detected, oncology referral may be useful. Per Fleischner Society Guidelines, consider a non-contrast Chest CT at 3 months, a PET/CT, or tissue sampling. Outpt oncology or pulmonary referral by primary team  Hx of right ICA siphon aneurysm Hx of stroke and seizure Status post pipeline stent right terminal ICA 2016 with Dr. Kathyrn Sheriff 12/2015 admitted for seizure, CT hygroma versus chronic SDH.  MRI showed right frontal parietal MCA/ACA punctate infarcts.  CT head and neck unremarkable.  Cerebral angiogram showed right ICA stent patent.  No DVT.  EF 55 to 60%.  EEG negative.  LDL 80, A1c 6.1.  Discharged on Plavix, Lipitor and Keppra. Follow-up with neurology as outpatient, 30-day cardiac event monitoring negative.  Keppra gradually tapered off. 12/2018 seen by teleneurology for left upper extremity abnormal movement, concerning for seizure.  CT no acute abnormality.  Keppra restarted 500 twice daily Per wife, patient only takes keppra 530m daily at home Episode of of left hand jerking, semi-rhythmic, no mental status change. Received ativan and early dose of night keppra. Movement resolved. Subsequent LTM EEG no seizure.  Recommend keppra 5041mbid on discharge. [facility] and wife is aware.   Hypertension Home meds:  norvasc Stable, denies hypotension at home Low BP Long-term BP goal normotensive  Hyperlipidemia Home meds:   Lipitor 4012mDL 81, goal < 70 Increased to Lipitor 80 Continue statin at discharge  Other Stroke Risk Factors Advanced Age >/= 65 61Other Active Problems Mild hyponatremia, sodium 132  Hospital day # 1  Neurology will sign off. Please call with questions. [facility]. Thanks for the consult.   JinRosalin HawkingD PhD Stroke Neurology 11/15/2022 10:58 AM   To contact Stroke Continuity provider, please refer to [email or web address], contact General Neurology

## 2022-11-15 NOTE — Progress Notes (Signed)
EEG was D/C'd. No skin break down noted. [facility] was notified.

## 2022-11-15 NOTE — Progress Notes (Signed)
Physical Therapy Treatment Patient Details Name: Damon Parrish MRN: [id number] DOB: [birth year] Today's Date: 11/15/2022   History of Present Illness Pt is 82 yo male who presents on 11/13/22 with sudden onset LUE and LLE weakness with L facial droop. MRI showed R corona radiata CVA and chronic R SDH. PMH:  right MCA stroke, epidural abscess, history of cerebral aneurysm repair in 2016, chronic subdural fluid collections (possible hygroma versus chronic subdurals), hypertension, hyperlipidemia, focal seizure, glaucoma, chronic lower back pain    PT Comments    Patient received in bed resting comfortably with EEG wires hooked up for study. Pt's wife at bedside. Pt eager to participate in therapy session and following cues/commands with extra time today. Pt was able to rise sit up to EOB with Min guard for safety. Sit<>Stand completed 2x with 2HHA, min assist to steady with slight Lt lean but pt able to find midline. Pt took several small forward steps with bil LE's to go fwd/bkwd ~3', no LOB, HR elevated to 130 bpm and seated rest provided with quick recovery to 80 bpm. Additional gait completed in room within limits of EEG wires, pt with strong drift to Lt and poor awareness of room surroundings walking directly into cabinet on Lt side of room. Mod Assist +2 required to guide steps to Rt and move back to EOB, pt unable to follow cues during gait to sequence steps/turn and having great difficulty coordinating Lt LE placement. Returned to supine in bed at EOB due to fatigue. Continue to recommend intense AIR follow up for pt to maximize regain of functional independence. Will continue to progress as able.   Recommendations for follow up therapy are one component of a multi-disciplinary discharge planning process, led by the attending physician.  Recommendations may be updated based on patient status, additional functional criteria and insurance authorization.  Follow Up Recommendations  Acute inpatient  rehab (3hours/day)     Assistance Recommended at Discharge Frequent or constant Supervision/Assistance  Patient can return home with the following A lot of help with walking and/or transfers;Help with stairs or ramp for entrance;Assist for transportation;Assistance with cooking/housework;A little help with bathing/dressing/bathroom   Equipment Recommendations  None recommended by PT    Recommendations for Other Services Rehab consult     Precautions / Restrictions Precautions Precautions: Fall Precaution Comments: fell 10/03/22 and hasn't really returned to baseline Restrictions Weight Bearing Restrictions: No Other Position/Activity Restrictions: resting R tremor at baseline     Mobility  Bed Mobility Overal bed mobility: Needs Assistance Bed Mobility: Supine to Sit     Supine to sit: Min guard, HOB elevated     General bed mobility comments: guarding for safety, pt required extra effort to complete, HOB elevated and using bed rail.    Transfers Overall transfer level: Needs assistance Equipment used: 2 person hand held assist Transfers: Sit to/from Stand Sit to Stand: Min assist, +2 safety/equipment           General transfer comment: Min+2 for power up from EOB, 2HHA provided and pt stead once standing. BOS slightly wider than shoulder width.    Ambulation/Gait Ambulation/Gait assistance: +2 safety/equipment, Min assist, Mod assist Gait Distance (Feet): 10 Feet Assistive device: 2 person hand held assist Gait Pattern/deviations: Step-through pattern, Decreased step length - left, Decreased stance time - left, Decreased stride length, Decreased dorsiflexion - left, Shuffle, Ataxic, Staggering left Gait velocity: decreased     General Gait Details: PT took initial steps forward and back to bed with min +  2 assist. HR elevated to 130 bpm and pt sat EOB to recover after going ~3'. HR recovered quickly to 80's. Pt stood to ambulate around foot of bed and more  fatigued, Lt LE ER and Abducated in standing and pt with great difficulty advancing Lt LE. Pt drifting into cabinet in room wiht Lt half of body despite cues to avoid, Mod assist required to come in front of pt and guide his steps to Rt to move back toward EOB. HR in 110-120's on second bout of gait, receovered to 70's.   Stairs             Wheelchair Mobility    Modified Rankin (Stroke Patients Only) Modified Rankin (Stroke Patients Only) Pre-Morbid Rankin Score: Slight disability Modified Rankin: Moderately severe disability     Balance Overall balance assessment: Needs assistance, History of Falls Sitting-balance support: Feet supported, No upper extremity supported Sitting balance-Leahy Scale: Fair Sitting balance - Comments: mild L lean, no LOB EOB Postural control: Left lateral lean Standing balance support: Bilateral upper extremity supported, During functional activity, Reliant on assistive device for balance Standing balance-Leahy Scale: Poor Standing balance comment: L lean in standing with decreased control LLE. Needs up to mod A for safety                            Cognition Arousal/Alertness: Awake/alert Behavior During Therapy: WFL for tasks assessed/performed Overall Cognitive Status: Impaired/Different from baseline Area of Impairment: Awareness, Safety/judgement, Following commands, Problem solving                       Following Commands: Follows multi-step commands inconsistently, Follows one step commands consistently, Follows one step commands with increased time Safety/Judgement: Decreased awareness of safety, Decreased awareness of deficits Awareness: Emergent Problem Solving: Slow processing, Requires verbal cues, Difficulty sequencing General Comments: pt has decreased awareness of what exactly is wrong and decreased awareness of deficits. Follows commands inconsistently with increased time, repeated cues. impaired planning for  tasks.        Exercises      General Comments General comments (skin integrity, edema, etc.): Heel to shin completed and pt with impaired coordination with Lt LE.      Pertinent Vitals/Pain Pain Assessment Pain Assessment: Faces Faces Pain Scale: No hurt Pain Intervention(s): Limited activity within patient's tolerance, Monitored during session, Repositioned    Home Living                          Prior Function            PT Goals (current goals can now be found in the care plan section) Acute Rehab PT Goals Patient Stated Goal: return home PT Goal Formulation: With patient/family Time For Goal Achievement: 11/28/22 Potential to Achieve Goals: Good Progress towards PT goals: Progressing toward goals    Frequency    Min 4X/week      PT Plan Current plan remains appropriate    Co-evaluation PT/OT/SLP Co-Evaluation/Treatment: Yes Reason for Co-Treatment: For patient/therapist safety;To address functional/ADL transfers PT goals addressed during session: Mobility/safety with mobility;Balance OT goals addressed during session: ADL's and self-care;Strengthening/ROM      AM-PAC PT "6 Clicks" Mobility   Outcome Measure  Help needed turning from your back to your side while in a flat bed without using bedrails?: None Help needed moving from lying on your back to sitting on the side of  a flat bed without using bedrails?: A Little Help needed moving to and from a bed to a chair (including a wheelchair)?: A Lot Help needed standing up from a chair using your arms (e.g., wheelchair or bedside chair)?: A Little Help needed to walk in hospital room?: Total Help needed climbing 3-5 steps with a railing? : Total 6 Click Score: 14    End of Session Equipment Utilized During Treatment: Gait belt Activity Tolerance: Patient tolerated treatment well Patient left: in chair;with call bell/phone within reach;with family/visitor present;Other (comment) (OT  present) Nurse Communication: Mobility status PT Visit Diagnosis: Unsteadiness on feet (R26.81);History of falling (Z91.81);Difficulty in walking, not elsewhere classified (R26.2)     Time: [id number] PT Time Calculation (min) (ACUTE ONLY): 26 min  Charges:  $Gait Training: 8-22 mins                     Verner Mould, DPT Acute Rehabilitation Services Office [phone]  11/15/22 10:52 AM

## 2022-11-15 NOTE — TOC Transition Note (Signed)
Transition of Care [facility]) - CM/SW Discharge Note   Patient Details  Name: Damon Parrish MRN: [id number] Date of Birth: 03-22-1941  [facility] CM/SW Contact:  Pollie Friar, RN Phone Number: 11/15/2022, 1:57 PM   Clinical Narrative:    Pt is discharging to CIR today. CM signing off.    Final next level of care: IP Rehab Facility Barriers to Discharge: No Barriers Identified   Patient Goals and CMS Choice CMS Medicare.gov Compare Post Acute Care list provided to:: Patient Choice offered to / list presented to : Patient  Discharge Placement                         Discharge Plan and Services Additional resources added to the After Visit Summary for                                       Social Determinants of Health (SDOH) Interventions SDOH Screenings   Food Insecurity: No Food Insecurity (11/14/2022)  Housing: Low Risk  (11/14/2022)  Transportation Needs: No Transportation Needs (11/14/2022)  Utilities: Not At Risk (11/14/2022)  Tobacco Use: High Risk (11/14/2022)     Readmission Risk Interventions     No data to display

## 2022-11-15 NOTE — Evaluation (Signed)
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[place]) 01/07/2019   Allergic reaction    DRESS syndrome    Pressure injury of skin 01/09/2018   Adverse drug reaction 01/08/2018   Dermatitis, drug-induced 01/08/2018   Discitis 12/17/2017   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   Rectal bleeding    Constipation 12/16/2017   Cerebrovascular accident (CVA) due to embolism of right middle cerebral artery ([place]) 03/06/2016   S/P cerebral aneurysm repair 03/06/2016   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   Palpitations 01/05/2016   Aneurysm, cerebral, nonruptured    Hyperlipidemia    Acute CVA (cerebrovascular accident) ([place]) 12/30/2015   Acute right MCA stroke ([place])    Focal seizure ([place]) 12/28/2015   HTN (hypertension) 12/28/2015   Hyponatremia 12/28/2015   Cerebral aneurysm 05/04/2015   Past Medical History:  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   GERD (gastroesophageal reflux disease)    H/O cerebral aneurysm repair 2016   "put stent in"   High cholesterol    History of blood transfusion ~ 1950   "while in hospital w/pneumonia" (01/09/2018)   History of kidney stones    Hypertension    Nonruptured cerebral aneurysm    Pneumonia ~ 1950   Red man syndrome    Seizures ([place]) 12/27/2015; 12/28/2015   Stroke ([place]) 12/2015   denies residual on 01/09/2018)   Past Surgical  History:  Past Surgical History:  Procedure Laterality Date   CATARACT EXTRACTION W/ INTRAOCULAR LENS  IMPLANT, BILATERAL Bilateral    COLONOSCOPY     2013 per patient: done in [place], normal, next colonoscopy due in 10 years   FOOT FRACTURE SURGERY Left    "pins, etc. in there"   [place] N/A 05/04/2015   Procedure: Pipeline Embolization;  Surgeon: Consuella Lose, MD;  Location: [place] [facility];  Service: Radiology;  Laterality: N/A;   RADIOLOGY WITH ANESTHESIA N/A 05/31/2015   Procedure: Pipeline Embolization;  Surgeon: Consuella Lose, MD;  Location: [place];  Service: Radiology;  Laterality: N/A;   HPI:  History of Present Illness Pt is 82 yo male who presents on 11/13/22 with sudden onset LUE and LLE weakness with L facial droop. MRI showed R corona radiata CVA and chronic R SDH. PMH:  right MCA stroke, epidural abscess, history of cerebral aneurysm repair in 2016, chronic subdural fluid collections (possible hygroma versus chronic subdurals), hypertension, hyperlipidemia, focal seizure, glaucoma, chronic lower back pain   Assessment / Plan / Recommendation Clinical Impression  Pt and wife deny cognitive impairments however therapist suspects there may be underlying deficits. Pt's wife manages pt;s meds but pt is responsible for household finances. He scored a 19/30 on the SLUMS with most noteable impairments in memory (recalled 1/5, storage and retrieval), awareness and problem solving. Therapist reviewed results and recommendations for continued ST focusing on compensatory strategies.  Pt and wife do not feel like his cognition is different from before this stroke and politely declined ST services in acute care. Plan is for possible inpatient rehab admission and therapist recommends pt be reassessed and focus on strategies to facilitate cognition when on rehab. Pt and wife voiced understanding.    SLP Assessment  SLP Recommendation/Assessment:  (pt and  wife declined further ST on acute) SLP Visit Diagnosis: Cognitive communication deficit [id number])    Recommendations for follow up therapy are one component of a multi-disciplinary discharge planning process, led by the attending physician.  Recommendations may be updated based on patient status, additional functional criteria and insurance authorization.    Follow Up Recommendations  Acute inpatient rehab (3hours/day)    Assistance Recommended at Discharge     Functional Status Assessment Patient has had a recent decline in their functional status and demonstrates the ability to make significant improvements in function in a reasonable and predictable amount of time.  Frequency and Duration           SLP Evaluation Cognition  Overall Cognitive Status: Impaired/Different from baseline Arousal/Alertness: Awake/alert Orientation Level: Oriented to person;Oriented to place;Oriented to time Year: 2024 Day of Week: Correct Attention: Sustained Sustained Attention: Appears intact Memory: Impaired Memory Impairment: Storage deficit;Retrieval deficit Awareness: Impaired Awareness Impairment: Emergent impairment;Intellectual impairment;Anticipatory impairment Problem Solving: Impaired Problem Solving Impairment: Verbal basic Safety/Judgment: Impaired       Comprehension  Auditory Comprehension Overall Auditory Comprehension: Appears within functional limits for tasks assessed Visual Recognition/Discrimination Discrimination: Not tested Reading Comprehension Reading Status: Not tested    Expression Expression Primary Mode of Expression: Verbal Verbal Expression Overall Verbal Expression: Appears within functional limits for tasks assessed Initiation: No impairment Naming: Impairment Divergent:  (10 animals in one min) Pragmatics: No impairment Written Expression Dominant Hand: Right Written Expression: Not tested   Oral / Motor  Oral Motor/Sensory Function Overall Oral  Motor/Sensory Function: Within functional limits Motor Speech Overall Motor Speech: Appears within functional limits for tasks assessed Respiration: Within functional limits Phonation: Normal Resonance: Within functional limits Articulation: Within functional limitis Intelligibility: Intelligible Motor Planning: Witnin functional limits            Houston Siren 11/15/2022, 11:14 AM

## 2022-11-15 NOTE — Progress Notes (Signed)
PMR Admission Coordinator Pre-Admission Assessment   Patient: Damon Parrish is an 82 y.o., male MRN: [id number] DOB: [birth year] Height: '5\' 9"'$  (175.3 cm) Weight: 81.1 kg   Insurance Information HMO:     PPO:      PCP:      IPA:      80/20:      OTHER:  PRIMARY: Medicare A/B      Policy#: [id number]      Subscriber: pt CM Name:       Phone#:      Fax#:  Pre-Cert#: verified Civil engineer, contracting:  Benefits:  Phone #:      Name:  Eff. Date: 11/21/05 A/B     Deduct: $1632      Out of Pocket Max: n/a      Life Max: n/a CIR: 100%      SNF: 20 full days Outpatient: 80%     Co-Pay: 20% Home Health: 100%      Co-Pay:  DME: 80%     Co-Pay: 20% Providers:  [employer] [place]      Policy#: [id number]     Phone#: [phone]   Financial Counselor:       Phone#:    The "Data Collection Information Summary" for patients in Inpatient Rehabilitation Facilities with attached "Privacy Act [place] Records" was provided and verbally reviewed with: Patient and Family   Emergency Contact Information Contact Information       Name Relation Home Work Mobile    [place] Spouse [phone]   [place] Son   [phone]    [place], Roper     [phone]           Current Medical History  Patient Admitting Diagnosis: CVA   History of Present Illness: Pt is a 82 y/o male with PMH of R MCA stoke, epidural abscess, cerebral aneurysm repair (2016), chronic SD fluid collections, HTN, seizures, glaucoma and chronic LBP, who presented to [facility] on 11/13/22 with c/o sudden onset L hemiparesis and L facial droop.  Blood pressure elevated with SBP in 150s, chronically low Na (131 in ED).  Stroke team consulted and workup initiated.  CT head negative, CTA showed no large vessel occlusion.  MRI showed small area of ischemia within the right corona radiata.  Loop recorder was recommended.  CTA head/neck showed incidental 11 mm nodule in the medial RUL, CT chest  showed 13 mm suspicious right solid pulmonary nodule.  Recommend OP oncology follow up.  Therapy evaluations completed and pt was recommended for CIR.    Complete NIHSS TOTAL: 0   Patient's medical record from [facility] has been reviewed by the rehabilitation admission coordinator and physician.   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 No   Family History   family history includes Aneurysm in his father; Dementia in his mother; Lymphoma in his mother.   Current Medications   Current Facility-Administered Medications:    acetaminophen (TYLENOL) tablet 650 mg, 650 mg, Oral, Q6H PRN **OR** acetaminophen (TYLENOL) 160 MG/5ML solution 650 mg, 650 mg, Per Tube, Q6H PRN **OR** acetaminophen (TYLENOL) suppository 650 mg, 650 mg, Rectal, Q6H PRN, Shela Leff, MD   aspirin EC tablet 81 mg, 81 mg, Oral, Daily, Lehner, Erin C, NP, 81 mg at 11/15/22 0936   atorvastatin (LIPITOR) tablet 80 mg, 80 mg, Oral, QHS, Rosalin Hawking, MD, 80 mg at 11/14/22 2147   clopidogrel (PLAVIX) tablet 75 mg, 75 mg, Oral, Daily, Thressa Sheller, Erin C, NP, 75 mg at 11/15/22 0936   enoxaparin (LOVENOX) injection 40 mg, 40 mg, Subcutaneous, Q24H, Rosalin Hawking, MD, 40 mg at 11/14/22 1837   finasteride (PROSCAR) tablet 5 mg, 5 mg, Oral, Daily,  Alekh, Kshitiz, MD, 5 mg at 11/15/22 0936   ipratropium-albuterol (DUONEB) 0.5-2.5 (3) MG/3ML nebulizer solution 3 mL, 3 mL, Nebulization, Q6H PRN, Starla Link, Kshitiz, MD   levETIRAcetam (KEPPRA) tablet 500 mg, 500 mg, Oral, BID, Starla Link, Kshitiz, MD, 500 mg at 11/15/22 [id number]   Patients Current Diet:  Diet Order                  Diet Heart Room service appropriate? Yes; Fluid consistency: Thin  Diet effective now                         Precautions / Restrictions Precautions Precautions: Fall Precaution Comments: fell 10/03/22 and hasn't really returned to baseline Restrictions Weight Bearing Restrictions: No Other Position/Activity Restrictions: resting R tremor at baseline    Has the patient had 2 or more falls or a fall with injury in the past year? Yes   Prior Activity Level Community (5-7x/wk): fully independent with mobility and adls, using cane in community, no DME in the house, driving, wife assists with med mgmt and finances   Prior Functional Level Self Care: Did the patient need help bathing, dressing, using the toilet or eating? Independent   Indoor Mobility: Did the patient need assistance with walking from room to room (with or without device)? Independent   Stairs: Did the patient need assistance with internal or external stairs (with or without device)? Independent   Functional Cognition: Did the patient need help planning regular tasks such as shopping or remembering to take medications? Needed some help   Patient Information Are you of Hispanic, Latino/a,or Spanish origin?: A. No, not of Hispanic, Latino/a, or Spanish origin What is your race?: A. White Do you need or want an interpreter to communicate with a doctor or health care staff?: 0. No   Patient's Response To:  Health Literacy and Transportation Is the patient able to respond to health literacy and transportation needs?: Yes Health Literacy - How often do you need to have someone help you when you  read instructions, pamphlets, or other written material from your doctor or pharmacy?: Never In the past 12 months, has lack of transportation kept you from medical appointments or from getting medications?: No In the past 12 months, has lack of transportation kept you from meetings, work, or from getting things needed for daily living?:  No   Home Assistive Devices / Equipment Home Assistive Devices/Equipment: Environmental consultant (specify type), Wheelchair, Cane (specify quad or straight), Blood pressure cuff Home Equipment: Conservation officer, nature (2 wheels), [employer] - single point, [employer] - built in, [employer] - tub/shower, Wheelchair - manual   Prior Device Use: Indicate devices/aids used by the patient prior to current illness, exacerbation or injury?  Cane in community   Current Functional Level Cognition   Overall Cognitive Status: Impaired/Different from baseline Orientation Level: Oriented X4 Safety/Judgement: Decreased awareness of safety, Decreased awareness of deficits General Comments: pt alert and oriented. requires increased time to follow commands with decreased problem solving.  Decreaed awareness and insight to deficits.    Extremity Assessment (includes Sensation/Coordination)   Upper Extremity Assessment: LUE deficits/detail LUE Deficits / Details: mild decreased strength and coordination. LUE Sensation: decreased proprioception LUE Coordination: decreased fine motor, decreased gross motor  Lower Extremity Assessment: Defer to PT evaluation LLE Deficits / Details: hip flex 4/5, knee ext 4/5 but pt with decreased proprioception and neuromuscular control of LLE with mobility LLE Sensation: decreased proprioception LLE Coordination: decreased gross motor, decreased fine motor     ADLs   Overall ADL's : Needs assistance/impaired Grooming: Minimal assistance, Standing Upper Body Dressing : Minimal assistance, Sitting Lower Body Dressing: Sit to/from stand, Moderate assistance Toilet  Transfer: Minimal assistance, Ambulation, Rolling walker (2 wheels) Functional mobility during ADLs: Minimal assistance, Rolling walker (2 wheels) General ADL Comments: +2 safety for functional mobiltiy     Mobility   Overal bed mobility: Needs Assistance Bed Mobility: Supine to Sit Supine to sit: Supervision General bed mobility comments: OOB upon entry     Transfers   Overall transfer level: Needs assistance Equipment used: Rolling walker (2 wheels) Transfers: Sit to/from Stand Sit to Stand: Min assist General transfer comment: min A to steady on L side.     Ambulation / Gait / Stairs / Wheelchair Mobility   Ambulation/Gait Ambulation/Gait assistance: +2 safety/equipment, Min assist Gait Distance (Feet): 60 Feet Assistive device: Rolling walker (2 wheels) Gait Pattern/deviations: Step-through pattern, Decreased step length - left, Decreased stance time - left General Gait Details: pt with ER L hip with difficulty keeping LLE within RW which is decreasing his safety with ambulation. He is not correcting this on his own but needing verbal and occasional tactile cues to correct. Having increased difficulty with turning. Gait velocity: decreased Gait velocity interpretation: <1.8 ft/sec, indicate of risk for recurrent falls     Posture / Balance Dynamic Sitting Balance Sitting balance - Comments: mild L lean, no LOB EOB Balance Overall balance assessment: Needs assistance, History of Falls Sitting-balance support: Feet supported, No upper extremity supported Sitting balance-Leahy Scale: Fair Sitting balance - Comments: mild L lean, no LOB EOB Postural control: Left lateral lean Standing balance support: Bilateral upper extremity supported, During functional activity Standing balance-Leahy Scale: Poor Standing balance comment: L lean in standing with decreased control LLE. Needs min A for safety     Special needs/care consideration N/a    Previous Home Environment (from acute  therapy documentation) Living Arrangements: Spouse/significant other Available Help at Discharge: Family, Available 24 hours/day Type of Home: House Home Layout: One level Home Access: Stairs to enter Entrance Stairs-Rails: None Entrance Stairs-Number of Steps: 1 Bathroom Shower/Tub: Multimedia programmer: Standard Home Care Services: No Additional Comments: retired from working at newspaper in [place] for Discharge Living Setting: Patient's home, Lives with (comment) (spouse) Type of Home at Discharge:  House Discharge Home Layout: One level Discharge Home Access: Stairs to enter Entrance Stairs-Rails: None Entrance Stairs-Number of Steps: 1 Discharge Bathroom Shower/Tub: Walk-in shower Discharge Bathroom Toilet: Standard Discharge Bathroom Accessibility: Yes How Accessible: Accessible via walker Does the patient have any problems obtaining your medications?: No   Social/Family/Support Systems Patient Roles: Spouse Anticipated Caregiver: wife, Masoud Schmidtke Anticipated Caregiver's Contact Information: [phone] Ability/Limitations of Caregiver: supervision mobility, min assist ADLs Caregiver Availability: 24/7 Discharge Plan Discussed with Primary Caregiver: Yes Is Caregiver In Agreement with Plan?: Yes Does Caregiver/Family have Issues with Lodging/Transportation while Pt is in Rehab?: No   Goals Patient/Family Goal for Rehab: PT/OT/SLP supervision to mod I Expected length of stay: 9-12 days Additional Information: Dispo: home with spouse providing 24/7, can provide supervision for mobility and up to min assist for ADLs. Pt/Family Agrees to Admission and willing to participate: Yes Program Orientation Provided & Reviewed with Pt/Caregiver Including Roles  & Responsibilities: Yes Additional Information Needs: spouse has questions regarding administration of eye drops for glaucoma, referred her to care coordinator RN   Decrease  burden of Care through IP rehab admission: n/a   Possible need for SNF placement upon discharge: Not anticipated.  Plan discharge home with spouse providing up to 24/7 supervision/assist     Patient Condition: I have reviewed medical records from [facility], spoken with  [facility] team , and patient and spouse. I met with patient at the bedside for inpatient rehabilitation assessment.  Patient will benefit from ongoing PT, OT, and SLP, can actively participate in 3 hours of therapy a day 5 days of the week, and can make measurable gains during the admission.  Patient will also benefit from the coordinated team approach during an Inpatient Acute Rehabilitation admission.  The patient will receive intensive therapy as well as Rehabilitation physician, nursing, social worker, and care management interventions.  Due to safety, skin/wound care, disease management, medication administration, pain management, and patient education the patient requires 24 hour a day rehabilitation nursing.  The patient is currently min assist with mobility and basic ADLs.  Discharge setting and therapy post discharge at home with home health is anticipated.  Patient has agreed to participate in the Acute Inpatient Rehabilitation Program and will admit today.   Preadmission Screen Completed By:  Michel Santee, PT, DPT 11/15/2022 10:43 AM ______________________________________________________________________   Discussed status with Dr. Tressa Busman on 11/15/22  at 10:53 AM  and received approval for admission today.   Admission Coordinator:  Michel Santee, PT, DPT time 10:53 AM Sudie Grumbling 11/15/22     Assessment/Plan: Diagnosis: Does the need for close, 24 hr/day Medical supervision in concert with the patient's rehab needs make it unreasonable for this patient to be served in a less intensive setting? Yes Co-Morbidities requiring supervision/potential complications: R ischemic MCA/ACA stroke, seizures, hypertension, hyponatremia, 30+  pack year smoking Hx with pulmonary nodule, constipation Due to bladder management, bowel management, safety, skin/wound care, disease management, medication administration, and patient education, does the patient require 24 hr/day rehab nursing? Yes Does the patient require coordinated care of a physician, rehab nurse, PT, OT, and SLP to address physical and functional deficits in the context of the above medical diagnosis(es)? Yes Addressing deficits in the following areas: balance, endurance, locomotion, strength, transferring, bowel/bladder control, bathing, dressing, feeding, grooming, toileting, and cognition Can the patient actively participate in an intensive therapy program of at least 3 hrs of therapy 5 days a week? Yes The potential for patient to make measurable gains while on  inpatient rehab is excellent Anticipated functional outcomes upon discharge from inpatient rehab: supervision PT, supervision OT, supervision SLP Estimated rehab length of stay to reach the above functional goals is: 10-12 days Anticipated discharge destination: Home 10. Overall Rehab/Functional Prognosis: excellent     MD Signature:   Gertie Gowda, DO 11/15/2022

## 2022-11-15 NOTE — Progress Notes (Signed)
Patient admitted to unit with belongings-no family at bedside. Patient educated on fall safety and rehab program. All questions answered. Dressing clean/dry and intact to left chest. Loop recorder monitor set up at bedside. Patient denies any pain.

## 2022-11-15 NOTE — Discharge Instructions (Signed)
Post procedure wound care instructions Keep incision clean and dry for 3 days. You can remove outer dressing tomorrow. Leave steri-strips (little pieces of tape) on until seen in the office for wound check appointment. Call the office ([phone]) for redness, drainage, swelling, or fever.  

## 2022-11-15 NOTE — Progress Notes (Signed)
LTM EEG hooked up and running - no initial skin breakdown - push button tested - [facility] monitoring.  

## 2022-11-15 NOTE — H&P (Incomplete)
Physical Medicine and Rehabilitation Admission H&P   CC: Functional deficits secondary to acute right punctate MCA/ACA watershed infarcts  HPI: Damon Parrish is an 82 year old male who presented to [facility] ER on 11/13/2022 onset of left-sided weakness. Code stroke was initiated.  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  He awoke on the morning of presentation in his normal state of health.  He went back to sleep and woke up approximately 4 hours later and noticed he was clumsy in his left hand and left leg was weak and shaky.  He is maintained on aspirin 325 mg.  He was out of the window for tenecteplase.  Imaging revealed chronic relatively stable subdural low-attenuation collection.  CTA of head and neck with no LVO.  MRI of the brain revealed small area of acute or early subacute ischemia within the right corona radiata.  He has history of both strokes and seizures and was seen in April 2017 at [facility] with patchy acute ischemia of the right frontoparietal lobes in the MCA territory infarct.  He recovered well and was followed up with neurology in 2018.  He developed seizures at this same timeframe and was put on Keppra and titrated off.  However in 2020 he was seen again for abnormal left lower extremity movements that were thought to be focal seizures and Keppra was resumed at 500 mg twice daily.  He is now on aspirin and Plavix and recommend duration for 3 weeks then Plavix alone.  As part of his workup, which included CTA of the neck, an 11 mm nodule in the medial right upper lobe was noted.  He underwent CT scan of the chest with recommendations to follow-up as an outpatient with repeat CT scan of chest in 3 months with or without PET scanning or tissue sampling.  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  He has a history of hypertension maintained on Norvasc and this is held to allow permissive hypertension.  He takes Lipitor 40 mg daily for hyperlipidemia and is now on high intensity therapy of 80 mg daily.  He will have loop recorder placed today.  He requires +2 min to mod assist for functional mobility.  He is tolerating a regular diet.The patient requires inpatient physical medicine and rehabilitation evaluations and treatment secondary to dysfunction due to right punctate MCA/ACA watershed infarcts.  Wife at bedside. Patient dozing off during my visit. He has several episodes of brief apnea which would wake him. No BM in 3 days.  Review of Systems  Constitutional:  Negative for chills and fever.  HENT:  Negative for hearing loss and sore throat.   Cardiovascular:  Negative for chest pain and palpitations.  Gastrointestinal:  Positive for constipation. Negative for diarrhea, nausea and vomiting.  Genitourinary:  Negative for dysuria and urgency.  Musculoskeletal:  Negative for back pain and neck pain.  Neurological:  Negative for dizziness and headaches.  Psychiatric/Behavioral:  Negative for depression. The patient does not have insomnia.        Wife says he frequently naps during the daytime.   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   GERD (  gastroesophageal reflux disease)    H/O cerebral aneurysm repair 2016   "put stent in"   High cholesterol    History of blood transfusion ~ 1950   "while in hospital w/pneumonia" (01/09/2018)   History of kidney stones    Hypertension    Nonruptured cerebral aneurysm    Pneumonia ~ 1950   Red man syndrome    Seizures ([place]) 12/27/2015; 12/28/2015   Stroke ([place]) 12/2015   denies residual on 01/09/2018)   Past Surgical History:  Procedure Laterality Date   CATARACT EXTRACTION W/  INTRAOCULAR LENS  IMPLANT, BILATERAL Bilateral    COLONOSCOPY     2013 per patient: done in [place], normal, next colonoscopy due in 10 years   FOOT FRACTURE SURGERY Left    "pins, etc. in there"   [place] N/A 05/04/2015   Procedure: Pipeline Embolization;  Surgeon: Consuella Lose, MD;  Location: [facility];  Service: Radiology;  Laterality: N/A;   RADIOLOGY WITH ANESTHESIA N/A 05/31/2015   Procedure: Pipeline Embolization;  Surgeon: Consuella Lose, MD;  Location: [facility];  Service: Radiology;  Laterality: N/A;   Family History  Problem Relation Age of Onset   Lymphoma Mother    Dementia Mother    Aneurysm Father    Colon cancer Neg Hx    Social History:  reports that he has been smoking cigarettes. He has a 61.00 pack-year smoking history. He has never used smokeless tobacco. He reports that he does not drink alcohol and does not use drugs. Allergies:  Allergies  Allergen Reactions   Cephalosporins Rash    Full body rash with systemic symptoms   Vancomycin Rash    Full body rash with systemic symptoms   Medications Prior to Admission  Medication Sig Dispense Refill   amLODipine (NORVASC) 10 MG tablet Take 10 mg by mouth daily.     Ascorbic Acid (VITAMIN C) 1000 MG tablet Take 1,000 mg by mouth daily.     aspirin 325 MG tablet Take 325 mg by mouth daily.     atorvastatin (LIPITOR) 40 MG tablet Take 40 mg by mouth daily.     bimatoprost (LUMIGAN) 0.03 % ophthalmic solution Place 1 drop into the right eye at bedtime.     Brinzolamide-Brimonidine (SIMBRINZA) 1-0.2 % SUSP Place 1 drop into both eyes 2 (two) times daily.      Cholecalciferol (VITAMIN D-1000 MAX ST) 1000 units tablet Take 1 tablet by mouth daily.     finasteride (PROSCAR) 5 MG tablet Take 5 mg by mouth daily.     levETIRAcetam (KEPPRA) 500 MG tablet Take 1 tablet (500 mg total) by mouth 2 (two) times daily. 60 tablet 1   Multiple Vitamin (MULTIVITAMIN) capsule Take 1 capsule  by mouth daily.     multivitamin-lutein (OCUVITE-LUTEIN) CAPS capsule Take 1 capsule by mouth daily.  0   prednisoLONE acetate (PRED FORTE) 1 % ophthalmic suspension Place 1 drop into the left eye daily.     tamsulosin (FLOMAX) 0.4 MG CAPS capsule Take 0.4 mg by mouth daily.     timolol (TIMOPTIC) 0.25 % ophthalmic solution Place 1 drop into the right eye daily.     TRELEGY ELLIPTA 100-62.5-25 MCG/ACT AEPB Inhale 1 puff into the lungs daily.     vitamin E 400 UNIT capsule Take 400 Units by mouth daily.        Home: Home Living Family/patient expects to be discharged to:: Private residence Living Arrangements: Spouse/significant other Available  Help at Discharge: Family, Available 24 hours/day Type of Home: House Home Access: Stairs to enter [employer] of Steps: 1 Entrance Stairs-Rails: None Home Layout: One level Bathroom Shower/Tub: Multimedia programmer: Standard Home Equipment: Conservation officer, nature (2 wheels), [employer] - single point, Civil engineer, contracting - built in, Ship broker, Wheelchair - manual Additional Comments: retired from working at newspaper in [employer] With: Spouse   Functional History: Prior Function Prior Level of Function : Independent/Modified Independent, Driving Mobility Comments: walk with [place] out of home, no AD in home ADLs Comments: indepedent ADls, driving but spouse manages meds and finances  Functional Status:  Mobility: Bed Mobility Overal bed mobility: Needs Assistance Bed Mobility: Supine to Sit Supine to sit: Min guard, HOB elevated General bed mobility comments: guarding for safety, pt required extra effort to complete, HOB elevated and using bed rail. Transfers Overall transfer level: Needs assistance Equipment used: 2 person hand held assist Transfers: Sit to/from Stand Sit to Stand: Min assist, +2 safety/equipment General transfer comment: Min+2 for power up from EOB, 2HHA provided and pt stead once standing. BOS  slightly wider than shoulder width. Ambulation/Gait Ambulation/Gait assistance: +2 safety/equipment, Min assist, Mod assist Gait Distance (Feet): 10 Feet Assistive device: 2 person hand held assist Gait Pattern/deviations: Step-through pattern, Decreased step length - left, Decreased stance time - left, Decreased stride length, Decreased dorsiflexion - left, Shuffle, Ataxic, Staggering left General Gait Details: PT took initial steps forward and back to bed with min +2 assist. HR elevated to 130 bpm and pt sat EOB to recover after going ~3'. HR recovered quickly to 80's. Pt stood to ambulate around foot of bed and more fatigued, Lt LE ER and Abducated in standing and pt with great difficulty advancing Lt LE. Pt drifting into cabinet in room wiht Lt half of body despite cues to avoid, Mod assist required to come in front of pt and guide his steps to Rt to move back toward EOB. HR in 110-120's on second bout of gait, receovered to 70's. Gait velocity: decreased Gait velocity interpretation: <1.8 ft/sec, indicate of risk for recurrent falls    ADL: ADL Overall ADL's : Needs assistance/impaired Grooming: Minimal assistance, Standing Upper Body Dressing : Minimal assistance, Sitting Lower Body Dressing: Sit to/from stand, Moderate assistance Toilet Transfer: Minimal assistance, Ambulation, Rolling walker (2 wheels) Functional mobility during ADLs: Minimal assistance, Rolling walker (2 wheels) General ADL Comments: +2 safety for functional mobiltiy  Cognition: Cognition Overall Cognitive Status: Impaired/Different from baseline Arousal/Alertness: Awake/alert Orientation Level: Oriented to person, Oriented to place, Oriented to time Year: 2024 Day of Week: Correct Attention: Sustained Sustained Attention: Appears intact Memory: Impaired Memory Impairment: Storage deficit, Retrieval deficit Awareness: Impaired Awareness Impairment: Emergent impairment, Intellectual impairment, Anticipatory  impairment Problem Solving: Impaired Problem Solving Impairment: Verbal basic Safety/Judgment: Impaired Cognition Arousal/Alertness: Awake/alert Behavior During Therapy: WFL for tasks assessed/performed Overall Cognitive Status: Impaired/Different from baseline Area of Impairment: Awareness, Safety/judgement, Following commands, Problem solving Following Commands: Follows multi-step commands inconsistently, Follows one step commands consistently, Follows one step commands with increased time Safety/Judgement: Decreased awareness of safety, Decreased awareness of deficits Awareness: Emergent Problem Solving: Slow processing, Requires verbal cues, Difficulty sequencing General Comments: pt has decreased awareness of what exactly is wrong and decreased awareness of deficits. Follows commands inconsistently with increased time, repeated cues. impaired planning for tasks.  Physical Exam: Blood pressure 136/72, pulse 65, temperature 98.9 F (37.2 C), temperature source Oral, resp. rate 19, height 5' 9"$  (1.753 m), weight 81.1  kg, SpO2 95 %. Physical Exam Constitutional:      General: He is not in acute distress.    Appearance: He is obese.  HENT:     Head: Normocephalic and atraumatic.     Mouth/Throat:     Mouth: Mucous membranes are dry.  Eyes:     Comments: Wears glasses  Cardiovascular:     Rate and Rhythm: Normal rate and regular rhythm.  Pulmonary:     Effort: Pulmonary effort is normal.     Breath sounds: Normal breath sounds.  Abdominal:     General: Bowel sounds are normal.     Palpations: Abdomen is soft.  Musculoskeletal:     Right lower leg: No edema.     Left lower leg: No edema.  Skin:    General: Skin is warm and dry.     Comments: PIV left Ac without erythema  Neurological:     Mental Status: He is alert and oriented to person, place, and time.  Psychiatric:        Mood and Affect: Mood normal.        Behavior: Behavior normal.   Results for orders placed or  performed during the hospital encounter of 11/13/22 (from the past 48 hour(s))  CBG monitoring, ED     Status: Abnormal   Collection Time: 11/13/22  6:17 PM  Result Value Ref Range   Glucose-Capillary 122 (H) 70 - 99 mg/dL    Comment: Glucose reference range applies only to samples taken after fasting for at least 8 hours.  Protime-INR     Status: None   Collection Time: 11/13/22  6:20 PM  Result Value Ref Range   Prothrombin Time 14.3 11.4 - 15.2 seconds   INR 1.1 0.8 - 1.2    Comment: (NOTE) INR goal varies based on device and disease states. Performed at [facility], [place] [address] [place], [place] [address]   APTT     Status: None   Collection Time: 11/13/22  6:20 PM  Result Value Ref Range   aPTT 33 24 - 36 seconds    Comment: Performed at [facility] [address] [place], [place] [address]  CBC     Status: Abnormal   Collection Time: 11/13/22  6:20 PM  Result Value Ref Range   WBC 9.7 4.0 - 10.5 K/uL   RBC 4.44 4.22 - 5.81 MIL/uL   Hemoglobin 15.3 13.0 - 17.0 g/dL   HCT 43.6 39.0 - 52.0 %   MCV 98.2 80.0 - 100.0 fL   MCH 34.5 (H) 26.0 - 34.0 pg   MCHC 35.1 30.0 - 36.0 g/dL   RDW 12.2 11.5 - 15.5 %   Platelets 254 150 - 400 K/uL   nRBC 0.0 0.0 - 0.2 %    Comment: Performed at [facility], [place] [address], [place], [place] [address]  Differential     Status: None   Collection Time: 11/13/22  6:20 PM  Result Value Ref Range   Neutrophils Relative % 58 %   Neutro Abs 5.6 1.7 - 7.7 K/uL   Lymphocytes Relative 33 %   Lymphs Abs 3.2 0.7 - 4.0 K/uL   Monocytes Relative 7 %   Monocytes Absolute 0.6 0.1 - 1.0 K/uL   Eosinophils Relative 2 %   Eosinophils Absolute 0.2 0.0 - 0.5 K/uL   Basophils Relative 0 %   Basophils Absolute 0.0 0.0 - 0.1 K/uL   Immature Granulocytes 0 %  Abs Immature Granulocytes 0.04 0.00 - 0.07 K/uL    Comment: Performed at [facility], [place] [address] [place], [place] [address]  Comprehensive metabolic panel      Status: Abnormal   Collection Time: 11/13/22  6:20 PM  Result Value Ref Range   Sodium 131 (L) 135 - 145 mmol/L   Potassium 4.3 3.5 - 5.1 mmol/L    Comment: HEMOLYSIS AT THIS LEVEL MAY AFFECT RESULT   Chloride 98 98 - 111 mmol/L   CO2 22 22 - 32 mmol/L   Glucose, Bld 130 (H) 70 - 99 mg/dL    Comment: Glucose reference range applies only to samples taken after fasting for at least 8 hours.   BUN 18 8 - 23 mg/dL   Creatinine, Ser 1.01 0.61 - 1.24 mg/dL   Calcium 9.5 8.9 - 10.3 mg/dL   Total Protein 6.7 6.5 - 8.1 g/dL   Albumin 4.0 3.5 - 5.0 g/dL   AST 33 15 - 41 U/L    Comment: HEMOLYSIS AT THIS LEVEL MAY AFFECT RESULT   ALT 27 0 - 44 U/L    Comment: HEMOLYSIS AT THIS LEVEL MAY AFFECT RESULT   Alkaline Phosphatase 106 38 - 126 U/L   Total Bilirubin 1.0 0.3 - 1.2 mg/dL    Comment: HEMOLYSIS AT THIS LEVEL MAY AFFECT RESULT   GFR, Estimated >60 >60 mL/min    Comment: (NOTE) Calculated using the CKD-EPI Creatinine Equation (2021)    Anion gap 11 5 - 15    Comment: Performed at [facility], [place] [address], [place], [place] [address]  Hemoglobin A1c     Status: Abnormal   Collection Time: 11/13/22  6:20 PM  Result Value Ref Range   Hgb A1c MFr Bld 6.1 (H) 4.8 - 5.6 %    Comment: (NOTE) Pre diabetes:          5.7%-6.4%  Diabetes:              >6.4%  Glycemic control for   <7.0% adults with diabetes    Mean Plasma Glucose 128.37 mg/dL    Comment: Performed at [facility] [address], [place], [place] [address]  I-stat chem 8, ED     Status: Abnormal   Collection Time: 11/13/22  6:29 PM  Result Value Ref Range   Sodium 132 (L) 135 - 145 mmol/L   Potassium 4.3 3.5 - 5.1 mmol/L   Chloride 100 98 - 111 mmol/L   BUN 22 8 - 23 mg/dL   Creatinine, Ser 1.00 0.61 - 1.24 mg/dL   Glucose, Bld 125 (H) 70 - 99 mg/dL    Comment: Glucose reference range applies only to samples taken after fasting for at least 8 hours.   Calcium, Ion 1.07 (L) 1.15 - 1.40 mmol/L   TCO2 22  22 - 32 mmol/L   Hemoglobin 15.3 13.0 - 17.0 g/dL   HCT 45.0 39.0 - 52.0 %  Ethanol     Status: None   Collection Time: 11/13/22  6:30 PM  Result Value Ref Range   Alcohol, Ethyl (B) <10 <10 mg/dL    Comment: (NOTE) Lowest detectable limit for serum alcohol is 10 mg/dL.  For medical purposes only. Performed at [facility], [place] [address], [place], [place] [address]   Urine rapid drug screen (hosp performed)     Status: None   Collection Time: 11/13/22  7:37 PM  Result Value Ref Range   Opiates NONE  DETECTED NONE DETECTED   Cocaine NONE DETECTED NONE DETECTED   Benzodiazepines NONE DETECTED NONE DETECTED   Amphetamines NONE DETECTED NONE DETECTED   Tetrahydrocannabinol NONE DETECTED NONE DETECTED   Barbiturates NONE DETECTED NONE DETECTED    Comment: (NOTE) DRUG SCREEN FOR MEDICAL PURPOSES ONLY.  IF CONFIRMATION IS NEEDED FOR ANY PURPOSE, NOTIFY LAB WITHIN 5 DAYS.  LOWEST DETECTABLE LIMITS FOR URINE DRUG SCREEN Drug Class                     Cutoff (ng/mL) Amphetamine and metabolites    1000 Barbiturate and metabolites    200 Benzodiazepine                 200 Opiates and metabolites        300 Cocaine and metabolites        300 THC                            50 Performed at [facility], [place] [address], [place], [place] [address]   Urinalysis, Routine w reflex microscopic -Urine, Clean Catch     Status: Abnormal   Collection Time: 11/13/22  7:37 PM  Result Value Ref Range   Color, Urine STRAW (A) YELLOW   APPearance CLEAR CLEAR   Specific Gravity, Urine 1.029 1.005 - 1.030   pH 7.0 5.0 - 8.0   Glucose, UA NEGATIVE NEGATIVE mg/dL   Hgb urine dipstick NEGATIVE NEGATIVE   Bilirubin Urine NEGATIVE NEGATIVE   Ketones, ur NEGATIVE NEGATIVE mg/dL   Protein, ur NEGATIVE NEGATIVE mg/dL   Nitrite NEGATIVE NEGATIVE   Leukocytes,Ua NEGATIVE NEGATIVE    Comment: Performed at [facility] [address] [place], [place] [address]  Resp panel by RT-PCR  (RSV, Flu A&B, Covid) Anterior Nasal Swab     Status: None   Collection Time: 11/13/22 11:21 PM   Specimen: Anterior Nasal Swab  Result Value Ref Range   SARS Coronavirus 2 by RT PCR NEGATIVE NEGATIVE   Influenza A by PCR NEGATIVE NEGATIVE   Influenza B by PCR NEGATIVE NEGATIVE    Comment: (NOTE) The Xpert Xpress SARS-CoV-2/FLU/RSV plus assay is intended as an aid in the diagnosis of influenza from Nasopharyngeal swab specimens and should not be used as a sole basis for treatment. Nasal washings and aspirates are unacceptable for Xpert Xpress SARS-CoV-2/FLU/RSV testing.  Fact Sheet for Patients: EntrepreneurPulse.com.au  Fact Sheet for Healthcare Providers: IncredibleEmployment.be  This test is not yet approved or cleared by the [place] FDA and has been authorized for detection and/or diagnosis of SARS-CoV-2 by FDA under an Emergency Use Authorization (EUA). This EUA will remain in effect (meaning this test can be used) for the duration of the COVID-19 declaration under Section 564(b)(1) of the Act, 21 U.S.C. section 360bbb-3(b)(1), unless the authorization is terminated or revoked.     Resp Syncytial Virus by PCR NEGATIVE NEGATIVE    Comment: (NOTE) Fact Sheet for Patients: EntrepreneurPulse.com.au  Fact Sheet for Healthcare Providers: IncredibleEmployment.be  This test is not yet approved or cleared by the [place] FDA and has been authorized for detection and/or diagnosis of SARS-CoV-2 by FDA under an Emergency Use Authorization (EUA). This EUA will remain in effect (meaning this test can be used) for the duration of the COVID-19 declaration under Section 564(b)(1) of the Act, 21 U.S.C. section 360bbb-3(b)(1), unless the authorization is terminated or revoked.  Performed at [facility], [place] [address], [place], [place] [address]   Levetiracetam level     Status: Abnormal    Collection Time: 11/14/22 12:18 AM  Result Value Ref Range   Levetiracetam Lvl <2.0 (L) 10.0 - 40.0 ug/mL    Comment: (NOTE) Performed At: [facility] [place], [place] [id number] Rush Farmer MD [id number]   Lipid panel     Status: None   Collection Time: 11/14/22 12:18 AM  Result Value Ref Range   Cholesterol 146 0 - 200 mg/dL   Triglycerides 96 <150 mg/dL   HDL 46 >40 mg/dL   Total CHOL/HDL Ratio 3.2 RATIO   VLDL 19 0 - 40 mg/dL   LDL Cholesterol 81 0 - 99 mg/dL    Comment:        Total Cholesterol/HDL:CHD Risk Coronary Heart Disease Risk Table                     Men   Women  1/2 Average Risk   3.4   3.3  Average Risk       5.0   4.4  2 X Average Risk   9.6   7.1  3 X Average Risk  23.4   11.0        Use the calculated Patient Ratio above and the CHD Risk Table to determine the patient's CHD Risk.        ATP III CLASSIFICATION (LDL):  <100     mg/dL   Optimal  100-129  mg/dL   Near or Above                    Optimal  130-159  mg/dL   Borderline  160-189  mg/dL   High  >190     mg/dL   Very High Performed at [facility] [address], [place], [place] [address]    Overnight EEG with video  Result Date: 11/15/2022 Lora Havens, MD     11/15/2022  9:43 AM Patient Name: NISHAD CARNAGHI MRN: [id number] Epilepsy Attending: Lora Havens Referring Physician/Provider: Rosalin Hawking, MD Duration: 11/14/2022 2314 to 11/15/2022 0930 Patient history: 82yo M with left hand shaking jerking spasm for the last 65mn. EEG to evaluate for seizure Level of alertness: Awake, asleep AEDs during EEG study: LEV Technical aspects: This EEG study was done with scalp electrodes positioned according to the 10-20 International system of electrode placement. Electrical activity was reviewed with band pass filter of 1-70Hz$ , sensitivity of 7 uV/mm, display speed of 325msec with a 60Hz$  notched filter applied as appropriate. EEG data were recorded continuously and  digitally stored.  Video monitoring was available and reviewed as appropriate. Description: The posterior dominant rhythm consists of 9-10 Hz activity of moderate voltage (25-35 uV) seen predominantly in posterior head regions, symmetric and reactive to eye opening and eye closing. Sleep was characterized by vertex waves, sleep spindles (12 to 14 Hz), maximal frontocentral region. EEG showed intermittent 3 to 6 Hz theta-delta slowing in right hemisphere. Generalized low amplitude 12-15hz$  beta activity was also noted. Hyperventilation and photic stimulation were not performed.   ABNORMALITY - Intermittent slow, right hemisphere IMPRESSION: This study is suggestive of cortical dysfunction arising from right hemisphere likely secondary to underlying structural abnormality. No seizures or epileptiform discharges were seen throughout the recording. Please note lack of epileptiform activity during interictal EEG does not exclude the diagnosis of epilepsy. [facility] ECHOCARD[facility] COMPLETE  Result  Date: 11/14/2022    ECHOCARDIOGRAM REPORT   Patient Name:   ALBUS TUNNICLIFF Date of Exam: 11/14/2022 Medical Rec #:  [id number]     Height:       69.0 in Accession #:    [id number]    Weight:       178.8 lb Date of Birth:  06/19/1941      BSA:          1.970 m Patient Age:    14 years      BP:           130/74 mmHg Patient Gender: M             HR:           67 bpm. Exam Location:  Inpatient Procedure: 2D Echo, Cardiac Doppler and Color Doppler Indications:    R55 Syncope  History:        Patient has no prior history of Echocardiogram examinations.                 Stroke; Risk Factors:Current Smoker and Hypertension.  Sonographer:    Wilkie Aye RVT RCS Referring Phys: [id number] [facility]  Sonographer Comments: No parasternal window, suboptimal subcostal window, Technically challenging study due to limited acoustic windows and Technically difficult study due to poor echo windows. IMPRESSIONS  1. Left ventricular  ejection fraction, by estimation, is 60 to 65%. Left ventricular ejection fraction by 3D volume is 64 %. The left ventricle has normal function. The left ventricle has no regional wall motion abnormalities. There is mild left ventricular hypertrophy. Left ventricular diastolic parameters are consistent with Grade I diastolic dysfunction (impaired relaxation).  2. Right ventricular systolic function is normal. The right ventricular size is mildly enlarged. There is normal pulmonary artery systolic pressure. The estimated right ventricular systolic pressure is [id number] mmHg.  3. The mitral valve is normal in structure. No evidence of mitral valve regurgitation. No evidence of mitral stenosis.  4. The aortic valve was not well visualized. Aortic valve regurgitation is not visualized. No aortic stenosis is present.  5. The inferior vena cava is normal in size with <50% respiratory variability, suggesting right atrial pressure of 8 mmHg. FINDINGS  Left Ventricle: Left ventricular ejection fraction, by estimation, is 60 to 65%. Left ventricular ejection fraction by 3D volume is 64 %. The left ventricle has normal function. The left ventricle has no regional wall motion abnormalities. The left ventricular internal cavity size was normal in size. There is mild left ventricular hypertrophy. Left ventricular diastolic parameters are consistent with Grade I diastolic dysfunction (impaired relaxation). Right Ventricle: The right ventricular size is mildly enlarged. No increase in right ventricular wall thickness. Right ventricular systolic function is normal. There is normal pulmonary artery systolic pressure. The tricuspid regurgitant velocity is 1.89  m/s, and with an assumed right atrial pressure of 8 mmHg, the estimated right ventricular systolic pressure is [id number] mmHg. Left Atrium: Left atrial size was normal in size. Right Atrium: Right atrial size was normal in size. Prominent Eustachian valve. Pericardium: There is no  evidence of pericardial effusion. Presence of epicardial fat layer. Mitral Valve: The mitral valve is normal in structure. No evidence of mitral valve regurgitation. No evidence of mitral valve stenosis. Tricuspid Valve: The tricuspid valve is normal in structure. Tricuspid valve regurgitation is trivial. No evidence of tricuspid stenosis. Aortic Valve: The aortic valve was not well visualized. Aortic valve regurgitation is not visualized. No aortic stenosis is present. Aortic valve mean  gradient measures 3.0 mmHg. Aortic valve peak gradient measures 6.0 mmHg. Aortic valve area, by VTI measures 2.47 cm. Pulmonic Valve: The pulmonic valve was not well visualized. Aorta: The aortic root is normal in size and structure. Venous: The inferior vena cava is normal in size with less than 50% respiratory variability, suggesting right atrial pressure of 8 mmHg. IAS/Shunts: No atrial level shunt detected by color flow Doppler.  LEFT VENTRICLE PLAX 2D LVIDd:         4.10 cm         Diastology LVIDs:         3.20 cm         LV e' medial:    6.48 cm/s LV PW:         1.10 cm         LV E/e' medial:  9.7 LV IVS:        1.20 cm         LV e' lateral:   7.42 cm/s LVOT diam:     1.80 cm         LV E/e' lateral: 8.4 LV SV:         58 LV SV Index:   29 LVOT Area:     2.54 cm        3D Volume EF                                LV 3D EF:    Left                                             ventricul                                             ar                                             ejection                                             fraction                                             by 3D                                             volume is                                             64 %.  3D Volume EF:                                3D EF:        64 %                                LV EDV:       109 ml                                LV ESV:       39 ml                                LV SV:         70 ml RIGHT VENTRICLE             IVC RV Basal diam:  3.30 cm     IVC diam: 1.90 cm RV S prime:     12.80 cm/s TAPSE (M-mode): 2.0 cm LEFT ATRIUM             Index        RIGHT ATRIUM           Index LA diam:        3.40 cm 1.73 cm/m   RA Area:     11.60 cm LA Vol (A2C):   49.2 ml 24.98 ml/m  RA Volume:   22.00 ml  11.17 ml/m LA Vol (A4C):   32.3 ml 16.40 ml/m LA Biplane Vol: 42.8 ml 21.73 ml/m  AORTIC VALVE AV Area (Vmax):    2.73 cm AV Area (Vmean):   2.52 cm AV Area (VTI):     2.47 cm AV Vmax:           122.00 cm/s AV Vmean:          84.000 cm/s AV VTI:            0.235 m AV Peak Grad:      6.0 mmHg AV Mean Grad:      3.0 mmHg LVOT Vmax:         131.00 cm/s LVOT Vmean:        83.100 cm/s LVOT VTI:          0.228 m LVOT/AV VTI ratio: 0.97  AORTA Ao Root diam: 3.30 cm MITRAL VALVE               TRICUSPID VALVE MV Area (PHT): 3.76 cm    TR Peak grad:   14.3 mmHg MV Decel Time: 202 msec    TR Vmax:        189.00 cm/s MV E velocity: 62.60 cm/s MV A velocity: 84.00 cm/s  SHUNTS MV E/A ratio:  0.75        Systemic VTI:  0.23 m                            Systemic Diam: 1.80 cm Cherlynn Kaiser MD Electronically signed by Cherlynn Kaiser MD Signature Date/Time: 11/14/2022/9:11:28 PM    Final    VAS [place] LOWER EXTREMITY VENOUS (DVT)  Result Date: 11/14/2022  Lower Venous DVT Study Patient Name:  Antionette Fairy  Date of Exam:  11/14/2022 Medical Rec #: [id number]      Accession #:    [id number] Date of Birth: 24-Oct-1940       Patient Gender: M Patient Age:   11 years Exam Location:  [facility] Procedure:      VAS [place] LOWER EXTREMITY VENOUS (DVT) Referring Phys: Cornelius Moras XU --------------------------------------------------------------------------------  Indications: Stroke.  Risk Factors: None identified. Comparison Study: No prior studies. Performing Technologist: Oliver Hum RVT  Examination Guidelines: A complete evaluation includes B-mode imaging, spectral Doppler, color Doppler, and power Doppler as  needed of all accessible portions of each vessel. Bilateral testing is considered an integral part of a complete examination. Limited examinations for reoccurring indications may be performed as noted. The reflux portion of the exam is performed with the patient in reverse Trendelenburg.  +---------+---------------+---------+-----------+----------+--------------+ RIGHT    CompressibilityPhasicitySpontaneityPropertiesThrombus Aging +---------+---------------+---------+-----------+----------+--------------+ CFV      Full           Yes      Yes                                 +---------+---------------+---------+-----------+----------+--------------+ SFJ      Full                                                        +---------+---------------+---------+-----------+----------+--------------+ FV Prox  Full                                                        +---------+---------------+---------+-----------+----------+--------------+ FV Mid   Full                                                        +---------+---------------+---------+-----------+----------+--------------+ FV DistalFull                                                        +---------+---------------+---------+-----------+----------+--------------+ PFV      Full                                                        +---------+---------------+---------+-----------+----------+--------------+ POP      Full           Yes      Yes                                 +---------+---------------+---------+-----------+----------+--------------+ PTV      Full                                                        +---------+---------------+---------+-----------+----------+--------------+  PERO     Full                                                        +---------+---------------+---------+-----------+----------+--------------+    +---------+---------------+---------+-----------+----------+--------------+ LEFT     CompressibilityPhasicitySpontaneityPropertiesThrombus Aging +---------+---------------+---------+-----------+----------+--------------+ CFV      Full           Yes      Yes                                 +---------+---------------+---------+-----------+----------+--------------+ SFJ      Full                                                        +---------+---------------+---------+-----------+----------+--------------+ FV Prox  Full                                                        +---------+---------------+---------+-----------+----------+--------------+ FV Mid   Full                                                        +---------+---------------+---------+-----------+----------+--------------+ FV DistalFull                                                        +---------+---------------+---------+-----------+----------+--------------+ PFV      Full                                                        +---------+---------------+---------+-----------+----------+--------------+ POP      Full           Yes      Yes                                 +---------+---------------+---------+-----------+----------+--------------+ PTV      Full                                                        +---------+---------------+---------+-----------+----------+--------------+ PERO     Full                                                        +---------+---------------+---------+-----------+----------+--------------+  Summary: RIGHT: - There is no evidence of deep vein thrombosis in the lower extremity.  - No cystic structure found in the popliteal fossa.  LEFT: - There is no evidence of deep vein thrombosis in the lower extremity.  - No cystic structure found in the popliteal fossa.  *See table(s) above for measurements and observations. Electronically signed  by Monica Martinez MD on 11/14/2022 at 2:26:37 PM.    Final    CT CHEST WO CONTRAST  Result Date: 11/13/2022 CLINICAL DATA:  Right upper lobe lung nodule on earlier CT angiography EXAM: CT CHEST WITHOUT CONTRAST TECHNIQUE: Multidetector CT imaging of the chest was performed following the standard protocol without IV contrast. RADIATION DOSE REDUCTION: This exam was performed according to the departmental dose-optimization program which includes automated exposure control, adjustment of the mA and/or kV according to patient size and/or use of iterative reconstruction technique. COMPARISON:  11/13/2022 FINDINGS: Cardiovascular: Unenhanced imaging of the heart is unremarkable without pericardial effusion. Normal caliber of the thoracic aorta. Atherosclerosis of the aorta and coronary vasculature. Evaluation of the vascular lumen is limited without IV contrast. Mediastinum/Nodes: No enlarged mediastinal or axillary lymph nodes. Thyroid gland, trachea, and esophagus demonstrate no significant findings. Lungs/Pleura: No acute airspace disease, effusion, or pneumothorax. Scattered areas of scarring are seen at the lung bases. Upper lobe predominant emphysema. There is a slightly spiculated subpleural right upper lobe pulmonary nodule measuring 15 x 10 x 14 mm, reference image 24/4. Upper Abdomen: No acute abnormality. Musculoskeletal: No acute or destructive bony lesions. Chronic right-sided fourth through sixth rib fractures are noted. Reconstructed images demonstrate no additional findings. IMPRESSION: 1. 13 mm suspicious right solid pulmonary nodule within the upper lobe. If the patient would be a therapy candidate should neoplasm be detected, oncology referral may be useful. Per Fleischner Society Guidelines, consider a non-contrast Chest CT at 3 months, a PET/CT, or tissue sampling. These guidelines do not apply to immunocompromised patients and patients with cancer. Follow up in patients with significant  comorbidities as clinically warranted. For lung cancer screening, adhere to Lung-RADS guidelines. Reference: Radiology. 2017; 284(1):228-43. 2. Aortic Atherosclerosis (ICD10-I70.0) and Emphysema (ICD10-J43.9). Electronically Signed   By: Randa Ngo M.D.   On: 11/13/2022 23:04   MR BRAIN WO CONTRAST  Result Date: 11/13/2022 CLINICAL DATA:  Acute neurologic deficit EXAM: MRI HEAD WITHOUT CONTRAST TECHNIQUE: Multiplanar, multiecho pulse sequences of the brain and surrounding structures were obtained without intravenous contrast. COMPARISON:  12/30/2015 FINDINGS: Brain: Small area of abnormal diffusion restriction within the right corona radiata. Chronic right subdural hematoma. No acute hemorrhage. There is multifocal hyperintense T2-weighted signal within the white matter. Generalized volume loss. The midline structures are normal. Vascular: Major flow voids are preserved. Skull and upper cervical spine: Normal calvarium and skull base. Visualized upper cervical spine and soft tissues are normal. Sinuses/Orbits:No paranasal sinus fluid levels or advanced mucosal thickening. No mastoid or middle ear effusion. Normal orbits. IMPRESSION: 1. Small area of acute or early subacute ischemia within the right corona radiata. 2. Chronic right subdural hematoma. Electronically Signed   By: Ulyses Jarred M.D.   On: 11/13/2022 20:48   CT ANGIO HEAD NECK W WO CM (CODE STROKE)  Result Date: 11/13/2022 CLINICAL DATA:  Neuro deficit, acute, stroke suspected. Left arm numbness and weakness. EXAM: CT ANGIOGRAPHY HEAD AND NECK TECHNIQUE: Multidetector CT imaging of the head and neck was performed using the standard protocol during bolus administration of intravenous contrast. Multiplanar CT image reconstructions and MIPs were obtained to evaluate the  vascular anatomy. Carotid stenosis measurements (when applicable) are obtained utilizing NASCET criteria, using the distal internal carotid diameter as the denominator. RADIATION  DOSE REDUCTION: This exam was performed according to the departmental dose-optimization program which includes automated exposure control, adjustment of the mA and/or kV according to patient size and/or use of iterative reconstruction technique. CONTRAST:  22m OMNIPAQUE IOHEXOL 350 MG/ML SOLN COMPARISON:  Head CT same day FINDINGS: CTA NECK FINDINGS Aortic arch: Aortic atherosclerosis. Branching pattern is normal without origin stenosis. Right carotid system: Common carotid artery widely patent to the bifurcation. Mild calcified plaque at the carotid bifurcation but no stenosis. Cervical ICA widely patent. Left carotid system: Common carotid artery widely patent to the bifurcation. Carotid bifurcation is normal. Cervical ICA is normal. Vertebral arteries: Both vertebral artery origins are widely patent. Both vertebral arteries appear normal through the cervical region to the foramen magnum. Skeleton: Chronic cervical spondylosis.  No acute finding. Other neck: No mass or lymphadenopathy. Upper chest: Emphysema and pulmonary scarring. Old rib fractures on the right. 11 mm nodule in the medial right upper lobe. This is new since 2017 and is worrisome for a possible lung cancer. Review of the MIP images confirms the above findings CTA HEAD FINDINGS Anterior circulation: Both internal carotid arteries are patent through the skull base and siphon regions. Ordinary siphon atherosclerosis. Carotid artery stent in the distal siphon to supraclinoid ICA on the right. No evidence of residual aneurysmal flow. The stent is patent. Flow is present in the anterior and middle cerebral arteries. No large vessel occlusion. Posterior circulation: Both vertebral arteries are patent through the foramen magnum to the basilar artery. No basilar stenosis. Superior cerebellar arteries and posterior cerebral arteries appear normal. Venous sinuses: Patent and normal. Anatomic variants: None significant. Review of the MIP images confirms the  above findings IMPRESSION: 1. No acute large vessel occlusion. 2. Aortic atherosclerosis. 3. Mild atherosclerotic change at the right carotid bifurcation but no stenosis. 4. Right ICA stent in the distal siphon to supraclinoid ICA. No evidence of residual aneurysmal flow. 5. 11 mm nodule in the medial right upper lobe. This is new since 2017 and is worrisome for a possible lung cancer. Consider one of the following in 3 months for both low-risk and high-risk individuals: (a) repeat chest CT, (b) follow-up PET-CT, or (c) tissue sampling. This recommendation follows the consensus statement: Guidelines for Management of Incidental Pulmonary Nodules Detected on CT Images: From the Fleischner Society 2017; Radiology 2017; 284:228-243. Because of the presence of emphysema and pulmonary scarring, this patient is certainly high risk. Because this was not present in 2017, I think proceeding to PET scan or tissue sampling could be preferable. Aortic Atherosclerosis (ICD10-I70.0). Electronically Signed   By: MNelson ChimesM.D.   On: 11/13/2022 18:59   CT HEAD CODE STROKE WO CONTRAST  Result Date: 11/13/2022 CLINICAL DATA:  Code stroke.  Neuro deficit, acute, stroke suspected EXAM: CT HEAD WITHOUT CONTRAST TECHNIQUE: Contiguous axial images were obtained from the base of the skull through the vertex without intravenous contrast. RADIATION DOSE REDUCTION: This exam was performed according to the departmental dose-optimization program which includes automated exposure control, adjustment of the mA and/or kV according to patient size and/or use of iterative reconstruction technique. COMPARISON:  CT head January 07, 2019. FINDINGS: Brain: Mild increase in thickness of a right subdural low-attenuation collection, measuring approximately 1.1 cm in thickness. Suspected trace left subdural low-attenuation fluid collection is unchanged. No evidence of acute large vascular territory infarct, or definitely acute  hemorrhage. Mild  associated mass effect without midline shift. Vascular: No hyperdense vessel identified. Right ICA stent. Calcific atherosclerosis. Skull: No acute fracture. Sinuses/Orbits: Fluid in left maxillary sinus. No acute orbital findings. Other: No mastoid effusions. ASPECTS [facility] Stroke Program Early CT Score) total score (0-10 with 10 being normal): 10. IMPRESSION: 1. Mild increase in thickness of a right subdural low-attenuation collection, measuring approximately 1.1 cm in thickness. Suspected trace left subdural low-attenuation fluid collection is unchanged. 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 Mild associated mass effect without midline shift. 2. Otherwise, no evidence of acute intracranial abnormality. Code stroke imaging results were communicated on 11/13/2022 at 6:38 pm to provider Baghat via telephone, who verbally acknowledged these results. Electronically Signed   By: Margaretha Sheffield M.D.   On: 11/13/2022 18:40      Blood pressure 136/72, pulse 65, temperature 98.9 F (37.2 C), temperature source Oral, resp. rate 19, height 5' 9"$  (1.753 m), weight 81.1 kg, SpO2 95 %.  Medical Problem List and Plan: 1. Functional deficits secondary to ***  -patient may *** shower  -ELOS/Goals: ***  2.  Antithrombotics: -DVT/anticoagulation:  Pharmaceutical: Lovenox  -antiplatelet therapy: aspirin and Plavix for three weeks followed by Plavix alone (stop aspirin 3/14)  3. Pain Management: Tylenol as needed  4. Mood/Behavior/Sleep: LCSW to evaluate and provide emotional support  -antipsychotic agents: n/a  5. Neuropsych/cognition: This patient *** capable of making decisions on *** own behalf.  6. Skin/Wound Care: Routine skin care checks   7. Fluids/Electrolytes/Nutrition: Routine Is and Os and follow-up chemistries  8: Hypertension: monitor TID and prn (home Norvasc held)  9: Hyperlipidemia: continue statin: now on high-intensity>>Lipitor 80 mg  daily)  10: BPH: continue Proscar 5 mg daily (home Flomax not restarted)  11: Prior history of stroke with seizure activity: follows with Dr. 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

## 2022-11-15 NOTE — Discharge Summary (Signed)
Physician Discharge Summary  Damon Parrish [id number] DOB: 27-Nov-1940 DOA: 11/13/2022  PCP: Tobe Sos, MD  Admit date: 11/13/2022 Discharge date: 11/15/2022  Admitted From: Home Disposition: CIR  Recommendations for Outpatient Follow-up:  Follow up with CIR provider at earliest convenience Outpatient follow-up with neurology  follow up in ED if symptoms worsen or new appear   Home Health: No Equipment/Devices: None  Discharge Condition: Stable CODE STATUS: Full Diet recommendation: Heart healthy/diet as per SLP recommendations.  Brief/Interim Summary: 82 y.o. male with medical history significant of right MCA stroke, epidural abscess, history of cerebral aneurysm repair in 2016, chronic subdural fluid collections (possible hygroma versus chronic subdurals), hypertension, hyperlipidemia, focal seizure, glaucoma, chronic lower back pain presented to the ED with sudden onset left upper and lower extremity weakness, left-sided facial droop.  Code stroke activated.  Out of tPA window at the time of arrival.  CT head without contrast showed mild increase in thickness of right subdural low-attenuation collection; no evidence of acute intracranial abnormality.  CTA of head and neck showed no acute large vessel occlusion.  Neurology was consulted.  MRI of the brain showed small area of acute/early subacute ischemia within the right coronary radiata with chronic right subdural hematoma.   Discharge Diagnoses:   Acute right ischemic infarct presenting with left-sided weakness Hyperlipidemia -Presented with left-sided weakness and left facial droop.  Imaging as above. -Neurology following and recommending aspirin and Plavix for 3 weeks then Plavix alone.  Also on Lipitor 80 mg daily. -PT/OT recommend CIR placement.  Diet as per SLP recommendations.  LDL 81.  A1c 6.1.  2D echo showed EF of 50 to 55% with grade 1 diastolic dysfunction.   -Neurology has cleared the patient for discharge.   Discharge patient to CIR today.   Breakthrough seizure in a patient with seizure disorder -Had seizure yesterday evening.  LTM EEG showed no seizures.  Neurology is recommending Keppra 500 twice a day on discharge (apparently patient was taking 500 mg once a day at home)   incidental pulmonary nodule -CT chest without contrast showed 13 mm suspicious right solid pulmonary nodule within the upper lobe: Will need outpatient follow-up with either chest CT or PET/CT or tissue sampling -I have sent an electronic referral via epic to outpatient pulmonary and hematology/oncology   Hyponatremia -Mild.  Encourage oral intake.  No labs this morning.  This can be monitored as an outpatient.   Chronic subdural hematoma -Neurology following.  Outpatient follow-up.   Hypertension - Allow for permissive hypertension at this time.  Antihypertensives can be resumed in 5 to 7 days if blood pressure is an issue.   Tobacco abuse, ongoing -Counseled regarding tobacco cessation   Goals of care -Overall prognosis is guarded to poor.  Palliative care evaluation appreciated: Patient remains full code  Discharge Instructions  Discharge Instructions     Ambulatory referral to Neurology   Complete by: As directed    An appointment is requested in approximately: 1 week   Diet - low sodium heart healthy   Complete by: As directed    Increase activity slowly   Complete by: As directed       Allergies as of 11/15/2022       Reactions   Cephalosporins Rash   Full body rash with systemic symptoms   Vancomycin Rash   Full body rash with systemic symptoms        Medication List     STOP taking these medications    amLODipine  10 MG tablet Commonly known as: NORVASC   aspirin 325 MG tablet Replaced by: aspirin EC 81 MG tablet   multivitamin-lutein Caps capsule       TAKE these medications    aspirin EC 81 MG tablet Take 1 tablet (81 mg total) by mouth daily. Swallow whole. Start taking  on: November 16, 2022 Replaces: aspirin 325 MG tablet   atorvastatin 80 MG tablet Commonly known as: LIPITOR Take 1 tablet (80 mg total) by mouth at bedtime. What changed:  medication strength how much to take when to take this   bimatoprost 0.03 % ophthalmic solution Commonly known as: LUMIGAN Place 1 drop into the right eye at bedtime.   clopidogrel 75 MG tablet Commonly known as: PLAVIX Take 1 tablet (75 mg total) by mouth daily. Start taking on: November 16, 2022   finasteride 5 MG tablet Commonly known as: PROSCAR Take 5 mg by mouth daily.   levETIRAcetam 500 MG tablet Commonly known as: KEPPRA Take 1 tablet (500 mg total) by mouth 2 (two) times daily.   multivitamin capsule Take 1 capsule by mouth daily.   prednisoLONE acetate 1 % ophthalmic suspension Commonly known as: PRED FORTE Place 1 drop into the left eye daily.   Simbrinza 1-0.2 % Susp Generic drug: Brinzolamide-Brimonidine Place 1 drop into both eyes 2 (two) times daily.   tamsulosin 0.4 MG Caps capsule Commonly known as: FLOMAX Take 1 capsule (0.4 mg total) by mouth daily after supper. What changed: when to take this   timolol 0.25 % ophthalmic solution Commonly known as: TIMOPTIC Place 1 drop into the right eye daily.   Trelegy Ellipta 100-62.5-25 MCG/ACT Aepb Generic drug: Fluticasone-Umeclidin-Vilant Inhale 1 puff into the lungs daily.   vitamin C 1000 MG tablet Take 1,000 mg by mouth daily.   Vitamin D-1000 Max St 25 MCG (1000 UT) tablet Generic drug: Cholecalciferol Take 1 tablet by mouth daily.   vitamin E 180 MG (400 UNITS) capsule Take 400 Units by mouth daily.        Follow-up Information     Cameron Sprang, MD. Schedule an appointment as soon as possible for a visit in 2 week(s).   Specialty: Neurology Why: wife is aware to re-schedule with [facility]. Contact information: [place] [address] [phone]         Tobe Sos, MD.  Schedule an appointment as soon as possible for a visit in 1 week(s).   Specialty: Internal Medicine Contact information: [address] [place] [address] [phone]                Allergies  Allergen Reactions   Cephalosporins Rash    Full body rash with systemic symptoms   Vancomycin Rash    Full body rash with systemic symptoms    Consultations: Neurology   Procedures/Studies: Overnight EEG with video  Result Date: 11/15/2022 Lora Havens, MD     11/15/2022  9:43 AM Patient Name: Damon Parrish MRN: [id number] Epilepsy Attending: Lora Havens Referring Physician/Provider: Rosalin Hawking, MD Duration: 11/14/2022 2314 to 11/15/2022 0930 Patient history: 82yo M with left hand shaking jerking spasm for the last 46mn. EEG to evaluate for seizure Level of alertness: Awake, asleep AEDs during EEG study: LEV Technical aspects: This EEG study was done with scalp electrodes positioned according to the 10-20 International system of electrode placement. Electrical activity was reviewed with band pass filter of 1-70Hz$ , sensitivity of 7 uV/mm, display speed of 323msec  with a 60Hz$  notched filter applied as appropriate. EEG data were recorded continuously and digitally stored.  Video monitoring was available and reviewed as appropriate. Description: The posterior dominant rhythm consists of 9-10 Hz activity of moderate voltage (25-35 uV) seen predominantly in posterior head regions, symmetric and reactive to eye opening and eye closing. Sleep was characterized by vertex waves, sleep spindles (12 to 14 Hz), maximal frontocentral region. EEG showed intermittent 3 to 6 Hz theta-delta slowing in right hemisphere. Generalized low amplitude 12-15hz$  beta activity was also noted. Hyperventilation and photic stimulation were not performed.   ABNORMALITY - Intermittent slow, right hemisphere IMPRESSION: This study is suggestive of cortical dysfunction arising from right hemisphere likely secondary to  underlying structural abnormality. No seizures or epileptiform discharges were seen throughout the recording. Please note lack of epileptiform activity during interictal EEG does not exclude the diagnosis of epilepsy. Lora Havens   ECHOCARDIOGRAM COMPLETE  Result Date: 11/14/2022    ECHOCARDIOGRAM REPORT   Patient Name:   Damon Parrish Date of Exam: 11/14/2022 Medical Rec #:  [id number]     Height:       69.0 in Accession #:    [id number]    Weight:       178.8 lb Date of Birth:  Dec 08, 1940      BSA:          1.970 m Patient Age:    82 years      BP:           130/74 mmHg Patient Gender: M             HR:           67 bpm. Exam Location:  Inpatient Procedure: 2D Echo, Cardiac Doppler and Color Doppler Indications:    R55 Syncope  History:        Patient has no prior history of Echocardiogram examinations.                 Stroke; Risk Factors:Current Smoker and Hypertension.  Sonographer:    Wilkie Aye RVT RCS Referring Phys: [id number] [facility]  Sonographer Comments: No parasternal window, suboptimal subcostal window, Technically challenging study due to limited acoustic windows and Technically difficult study due to poor echo windows. IMPRESSIONS  1. Left ventricular ejection fraction, by estimation, is 60 to 65%. Left ventricular ejection fraction by 3D volume is 64 %. The left ventricle has normal function. The left ventricle has no regional wall motion abnormalities. There is mild left ventricular hypertrophy. Left ventricular diastolic parameters are consistent with Grade I diastolic dysfunction (impaired relaxation).  2. Right ventricular systolic function is normal. The right ventricular size is mildly enlarged. There is normal pulmonary artery systolic pressure. The estimated right ventricular systolic pressure is 99991111 mmHg.  3. The mitral valve is normal in structure. No evidence of mitral valve regurgitation. No evidence of mitral stenosis.  4. The aortic valve was not well visualized. Aortic  valve regurgitation is not visualized. No aortic stenosis is present.  5. The inferior vena cava is normal in size with <50% respiratory variability, suggesting right atrial pressure of 8 mmHg. FINDINGS  Left Ventricle: Left ventricular ejection fraction, by estimation, is 60 to 65%. Left ventricular ejection fraction by 3D volume is 64 %. The left ventricle has normal function. The left ventricle has no regional wall motion abnormalities. The left ventricular internal cavity size was normal in size. There is mild left ventricular hypertrophy. Left ventricular diastolic parameters are consistent with  Grade I diastolic dysfunction (impaired relaxation). Right Ventricle: The right ventricular size is mildly enlarged. No increase in right ventricular wall thickness. Right ventricular systolic function is normal. There is normal pulmonary artery systolic pressure. The tricuspid regurgitant velocity is 1.89  m/s, and with an assumed right atrial pressure of 8 mmHg, the estimated right ventricular systolic pressure is 99991111 mmHg. Left Atrium: Left atrial size was normal in size. Right Atrium: Right atrial size was normal in size. Prominent Eustachian valve. Pericardium: There is no evidence of pericardial effusion. Presence of epicardial fat layer. Mitral Valve: The mitral valve is normal in structure. No evidence of mitral valve regurgitation. No evidence of mitral valve stenosis. Tricuspid Valve: The tricuspid valve is normal in structure. Tricuspid valve regurgitation is trivial. No evidence of tricuspid stenosis. Aortic Valve: The aortic valve was not well visualized. Aortic valve regurgitation is not visualized. No aortic stenosis is present. Aortic valve mean gradient measures 3.0 mmHg. Aortic valve peak gradient measures 6.0 mmHg. Aortic valve area, by VTI measures 2.47 cm. Pulmonic Valve: The pulmonic valve was not well visualized. Aorta: The aortic root is normal in size and structure. Venous: The inferior vena  cava is normal in size with less than 50% respiratory variability, suggesting right atrial pressure of 8 mmHg. IAS/Shunts: No atrial level shunt detected by color flow Doppler.  LEFT VENTRICLE PLAX 2D LVIDd:         4.10 cm         Diastology LVIDs:         3.20 cm         LV e' medial:    6.48 cm/s LV PW:         1.10 cm         LV E/e' medial:  9.7 LV IVS:        1.20 cm         LV e' lateral:   7.42 cm/s LVOT diam:     1.80 cm         LV E/e' lateral: 8.4 LV SV:         58 LV SV Index:   29 LVOT Area:     2.54 cm        3D Volume EF                                LV 3D EF:    Left                                             ventricul                                             ar                                             ejection  fraction                                             by 3D                                             volume is                                             64 %.                                 3D Volume EF:                                3D EF:        64 %                                LV EDV:       109 ml                                LV ESV:       39 ml                                LV SV:        70 ml RIGHT VENTRICLE             IVC RV Basal diam:  3.30 cm     IVC diam: 1.90 cm RV S prime:     12.80 cm/s TAPSE (M-mode): 2.0 cm LEFT ATRIUM             Index        RIGHT ATRIUM           Index LA diam:        3.40 cm 1.73 cm/m   RA Area:     11.60 cm LA Vol (A2C):   49.2 ml 24.98 ml/m  RA Volume:   22.00 ml  11.17 ml/m LA Vol (A4C):   32.3 ml 16.40 ml/m LA Biplane Vol: 42.8 ml 21.73 ml/m  AORTIC VALVE AV Area (Vmax):    2.73 cm AV Area (Vmean):   2.52 cm AV Area (VTI):     2.47 cm AV Vmax:           122.00 cm/s AV Vmean:          84.000 cm/s AV VTI:            0.235 m AV Peak Grad:      6.0 mmHg AV Mean Grad:      3.0 mmHg LVOT Vmax:         131.00 cm/s LVOT Vmean:        83.100 cm/s LVOT VTI:          0.228 m  LVOT/AV VTI  ratio: 0.97  AORTA Ao Root diam: 3.30 cm MITRAL VALVE               TRICUSPID VALVE MV Area (PHT): 3.76 cm    TR Peak grad:   14.3 mmHg MV Decel Time: 202 msec    TR Vmax:        189.00 cm/s MV E velocity: 62.60 cm/s MV A velocity: 84.00 cm/s  SHUNTS MV E/A ratio:  0.75        Systemic VTI:  0.23 m                            Systemic Diam: 1.80 cm Cherlynn Kaiser MD Electronically signed by Cherlynn Kaiser MD Signature Date/Time: 11/14/2022/9:11:28 PM    Final    VAS [place] LOWER EXTREMITY VENOUS (DVT)  Result Date: 11/14/2022  Lower Venous DVT Study Patient Name:  KHAMAR STANDARD  Date of Exam:   11/14/2022 Medical Rec #: [id number]      Accession #:    [id number] Date of Birth: May 11, 1941       Patient Gender: M Patient Age:   32 years Exam [facility]  [facility] Procedure:      VAS [place] LOWER EXTREMITY VENOUS (DVT) Referring Phys: Cornelius Moras XU --------------------------------------------------------------------------------  Indications: Stroke.  Risk Factors: None identified. Comparison Study: No prior studies. Performing Technologist: Oliver Hum RVT  Examination Guidelines: A complete evaluation includes B-mode imaging, spectral Doppler, color Doppler, and power Doppler as needed of all accessible portions of each vessel. Bilateral testing is considered an integral part of a complete examination. Limited examinations for reoccurring indications may be performed as noted. The reflux portion of the exam is performed with the patient in reverse Trendelenburg.  +---------+---------------+---------+-----------+----------+--------------+ RIGHT    CompressibilityPhasicitySpontaneityPropertiesThrombus Aging +---------+---------------+---------+-----------+----------+--------------+ CFV      Full           Yes      Yes                                 +---------+---------------+---------+-----------+----------+--------------+ SFJ      Full                                                         +---------+---------------+---------+-----------+----------+--------------+ FV Prox  Full                                                        +---------+---------------+---------+-----------+----------+--------------+ FV Mid   Full                                                        +---------+---------------+---------+-----------+----------+--------------+ FV DistalFull                                                        +---------+---------------+---------+-----------+----------+--------------+  PFV      Full                                                        +---------+---------------+---------+-----------+----------+--------------+ POP      Full           Yes      Yes                                 +---------+---------------+---------+-----------+----------+--------------+ PTV      Full                                                        +---------+---------------+---------+-----------+----------+--------------+ PERO     Full                                                        +---------+---------------+---------+-----------+----------+--------------+   +---------+---------------+---------+-----------+----------+--------------+ LEFT     CompressibilityPhasicitySpontaneityPropertiesThrombus Aging +---------+---------------+---------+-----------+----------+--------------+ CFV      Full           Yes      Yes                                 +---------+---------------+---------+-----------+----------+--------------+ SFJ      Full                                                        +---------+---------------+---------+-----------+----------+--------------+ FV Prox  Full                                                        +---------+---------------+---------+-----------+----------+--------------+ FV Mid   Full                                                         +---------+---------------+---------+-----------+----------+--------------+ FV DistalFull                                                        +---------+---------------+---------+-----------+----------+--------------+ PFV      Full                                                        +---------+---------------+---------+-----------+----------+--------------+  POP      Full           Yes      Yes                                 +---------+---------------+---------+-----------+----------+--------------+ PTV      Full                                                        +---------+---------------+---------+-----------+----------+--------------+ PERO     Full                                                        +---------+---------------+---------+-----------+----------+--------------+     Summary: RIGHT: - There is no evidence of deep vein thrombosis in the lower extremity.  - No cystic structure found in the popliteal fossa.  LEFT: - There is no evidence of deep vein thrombosis in the lower extremity.  - No cystic structure found in the popliteal fossa.  *See table(s) above for measurements and observations. Electronically signed by Monica Martinez MD on 11/14/2022 at 2:26:37 PM.    Final    CT CHEST WO CONTRAST  Result Date: 11/13/2022 CLINICAL DATA:  Right upper lobe lung nodule on earlier CT angiography EXAM: CT CHEST WITHOUT CONTRAST TECHNIQUE: Multidetector CT imaging of the chest was performed following the standard protocol without IV contrast. RADIATION DOSE REDUCTION: This exam was performed according to the departmental dose-optimization program which includes automated exposure control, adjustment of the mA and/or kV according to patient size and/or use of iterative reconstruction technique. COMPARISON:  11/13/2022 FINDINGS: Cardiovascular: Unenhanced imaging of the heart is unremarkable without pericardial effusion. Normal caliber of the thoracic aorta.  Atherosclerosis of the aorta and coronary vasculature. Evaluation of the vascular lumen is limited without IV contrast. Mediastinum/Nodes: No enlarged mediastinal or axillary lymph nodes. Thyroid gland, trachea, and esophagus demonstrate no significant findings. Lungs/Pleura: No acute airspace disease, effusion, or pneumothorax. Scattered areas of scarring are seen at the lung bases. Upper lobe predominant emphysema. There is a slightly spiculated subpleural right upper lobe pulmonary nodule measuring 15 x 10 x 14 mm, reference image 24/4. Upper Abdomen: No acute abnormality. Musculoskeletal: No acute or destructive bony lesions. Chronic right-sided fourth through sixth rib fractures are noted. Reconstructed images demonstrate no additional findings. IMPRESSION: 1. 13 mm suspicious right solid pulmonary nodule within the upper lobe. If the patient would be a therapy candidate should neoplasm be detected, oncology referral may be useful. Per Fleischner Society Guidelines, consider a non-contrast Chest CT at 3 months, a PET/CT, or tissue sampling. These guidelines do not apply to immunocompromised patients and patients with cancer. Follow up in patients with significant comorbidities as clinically warranted. For lung cancer screening, adhere to Lung-RADS guidelines. Reference: Radiology. 2017; 284(1):228-43. 2. Aortic Atherosclerosis (ICD10-I70.0) and Emphysema (ICD10-J43.9). Electronically Signed   By: Randa Ngo M.D.   On: 11/13/2022 23:04   MR BRAIN WO CONTRAST  Result Date: 11/13/2022 CLINICAL DATA:  Acute neurologic deficit EXAM: MRI HEAD WITHOUT CONTRAST TECHNIQUE: Multiplanar, multiecho pulse sequences of the brain and surrounding structures were obtained without  intravenous contrast. COMPARISON:  12/30/2015 FINDINGS: Brain: Small area of abnormal diffusion restriction within the right corona radiata. Chronic right subdural hematoma. No acute hemorrhage. There is multifocal hyperintense T2-weighted  signal within the white matter. Generalized volume loss. The midline structures are normal. Vascular: Major flow voids are preserved. Skull and upper cervical spine: Normal calvarium and skull base. Visualized upper cervical spine and soft tissues are normal. Sinuses/Orbits:No paranasal sinus fluid levels or advanced mucosal thickening. No mastoid or middle ear effusion. Normal orbits. IMPRESSION: 1. Small area of acute or early subacute ischemia within the right corona radiata. 2. Chronic right subdural hematoma. Electronically Signed   By: Ulyses Jarred M.D.   On: 11/13/2022 20:48   CT ANGIO HEAD NECK W WO CM (CODE STROKE)  Result Date: 11/13/2022 CLINICAL DATA:  Neuro deficit, acute, stroke suspected. Left arm numbness and weakness. EXAM: CT ANGIOGRAPHY HEAD AND NECK TECHNIQUE: Multidetector CT imaging of the head and neck was performed using the standard protocol during bolus administration of intravenous contrast. Multiplanar CT image reconstructions and MIPs were obtained to evaluate the vascular anatomy. Carotid stenosis measurements (when applicable) are obtained utilizing NASCET criteria, using the distal internal carotid diameter as the denominator. RADIATION DOSE REDUCTION: This exam was performed according to the departmental dose-optimization program which includes automated exposure control, adjustment of the mA and/or kV according to patient size and/or use of iterative reconstruction technique. CONTRAST:  6m OMNIPAQUE IOHEXOL 350 MG/ML SOLN COMPARISON:  Head CT same day FINDINGS: CTA NECK FINDINGS Aortic arch: Aortic atherosclerosis. Branching pattern is normal without origin stenosis. Right carotid system: Common carotid artery widely patent to the bifurcation. Mild calcified plaque at the carotid bifurcation but no stenosis. Cervical ICA widely patent. Left carotid system: Common carotid artery widely patent to the bifurcation. Carotid bifurcation is normal. Cervical ICA is normal. Vertebral  arteries: Both vertebral artery origins are widely patent. Both vertebral arteries appear normal through the cervical region to the foramen magnum. Skeleton: Chronic cervical spondylosis.  No acute finding. Other neck: No mass or lymphadenopathy. Upper chest: Emphysema and pulmonary scarring. Old rib fractures on the right. 11 mm nodule in the medial right upper lobe. This is new since 2017 and is worrisome for a possible lung cancer. Review of the MIP images confirms the above findings CTA HEAD FINDINGS Anterior circulation: Both internal carotid arteries are patent through the skull base and siphon regions. Ordinary siphon atherosclerosis. Carotid artery stent in the distal siphon to supraclinoid ICA on the right. No evidence of residual aneurysmal flow. The stent is patent. Flow is present in the anterior and middle cerebral arteries. No large vessel occlusion. Posterior circulation: Both vertebral arteries are patent through the foramen magnum to the basilar artery. No basilar stenosis. Superior cerebellar arteries and posterior cerebral arteries appear normal. Venous sinuses: Patent and normal. Anatomic variants: None significant. Review of the MIP images confirms the above findings IMPRESSION: 1. No acute large vessel occlusion. 2. Aortic atherosclerosis. 3. Mild atherosclerotic change at the right carotid bifurcation but no stenosis. 4. Right ICA stent in the distal siphon to supraclinoid ICA. No evidence of residual aneurysmal flow. 5. 11 mm nodule in the medial right upper lobe. This is new since 2017 and is worrisome for a possible lung cancer. Consider one of the following in 3 months for both low-risk and high-risk individuals: (a) repeat chest CT, (b) follow-up PET-CT, or (c) tissue sampling. This recommendation follows the consensus statement: Guidelines for Management of Incidental Pulmonary Nodules Detected on CT  Images: From the Fleischner Society 2017; Radiology 2017; [id number]. Because of the  presence of emphysema and pulmonary scarring, this patient is certainly high risk. Because this was not present in 2017, I think proceeding to PET scan or tissue sampling could be preferable. Aortic Atherosclerosis (ICD10-I70.0). Electronically Signed   By: Nelson Chimes M.D.   On: 11/13/2022 18:59   CT HEAD CODE STROKE WO CONTRAST  Result Date: 11/13/2022 CLINICAL DATA:  Code stroke.  Neuro deficit, acute, stroke suspected EXAM: CT HEAD WITHOUT CONTRAST TECHNIQUE: Contiguous axial images were obtained from the base of the skull through the vertex without intravenous contrast. RADIATION DOSE REDUCTION: This exam was performed according to the departmental dose-optimization program which includes automated exposure control, adjustment of the mA and/or kV according to patient size and/or use of iterative reconstruction technique. COMPARISON:  CT head January 07, 2019. FINDINGS: Brain: Mild increase in thickness of a right subdural low-attenuation collection, measuring approximately 1.1 cm in thickness. Suspected trace left subdural low-attenuation fluid collection is unchanged. No evidence of acute large vascular territory infarct, or definitely acute hemorrhage. Mild associated mass effect without midline shift. Vascular: No hyperdense vessel identified. Right ICA stent. Calcific atherosclerosis. Skull: No acute fracture. Sinuses/Orbits: Fluid in left maxillary sinus. No acute orbital findings. Other: No mastoid effusions. ASPECTS [facility] Stroke Program Early CT Score) total score (0-10 with 10 being normal): 10. IMPRESSION: 1. Mild increase in thickness of a right subdural low-attenuation collection, measuring approximately 1.1 cm in thickness. Suspected trace left subdural low-attenuation fluid collection is unchanged. 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 Mild associated mass effect without midline shift. 2. Otherwise, no evidence of acute intracranial abnormality. Code  stroke imaging results were communicated on 11/13/2022 at 6:38 pm to provider Baghat via telephone, who verbally acknowledged these results. Electronically Signed   By: Margaretha Sheffield M.D.   On: 11/13/2022 18:40      Subjective: Patient seen and examined at bedside.  Poor historian.  Had left hand shaking and jerking last evening as per nursing staff for which patient was given IV Ativan.  Patient was agitated all night and was trying to get out of as per nursing staff.  No fever, vomiting reported.   Discharge Exam: Vitals:   11/15/22 0812 11/15/22 1218  BP: 136/72 125/71  Pulse: 65 62  Resp: 19 16  Temp: 98.9 F (37.2 C) 97.7 F (36.5 C)  SpO2: 95% 96%    General: On room air.  No distress.  Looks chronically ill and deconditioned. ENT/neck: No thyromegaly.  JVD is not elevated  respiratory: Decreased breath sounds at bases bilaterally with some crackles; no wheezing  CVS: S1-S2 heard, rate controlled currently Abdominal: Soft, nontender, slightly distended; no organomegaly, bowel sounds are heard Extremities: Trace lower extremity edema; no cyanosis  CNS: Awake, confused.  Slow to respond.  Poor historian.  No focal neurologic deficit.  Moves extremities Lymph: No obvious lymphadenopathy Skin: No obvious ecchymosis/lesions  psych: Not agitated currently.  Affect is mostly flat.   Musculoskeletal: No obvious joint swelling/deformity    The results of significant diagnostics from this hospitalization (including imaging, microbiology, ancillary and laboratory) are listed below for reference.     Microbiology: Recent Results (from the past 240 hour(s))  Resp panel by RT-PCR (RSV, Flu A&B, Covid) Anterior Nasal Swab     Status: None   Collection Time: 11/13/22 11:21 PM   Specimen: Anterior Nasal Swab  Result Value Ref Range Status   SARS Coronavirus  2 by RT PCR NEGATIVE NEGATIVE Final   Influenza A by PCR NEGATIVE NEGATIVE Final   Influenza B by PCR NEGATIVE NEGATIVE Final     Comment: (NOTE) The Xpert Xpress SARS-CoV-2/FLU/RSV plus assay is intended as an aid in the diagnosis of influenza from Nasopharyngeal swab specimens and should not be used as a sole basis for treatment. Nasal washings and aspirates are unacceptable for Xpert Xpress SARS-CoV-2/FLU/RSV testing.  Fact Sheet for Patients: EntrepreneurPulse.com.au  Fact Sheet for Healthcare Providers: IncredibleEmployment.be  This test is not yet approved or cleared by the [place] FDA and has been authorized for detection and/or diagnosis of SARS-CoV-2 by FDA under an Emergency Use Authorization (EUA). This EUA will remain in effect (meaning this test can be used) for the duration of the COVID-19 declaration under Section 564(b)(1) of the Act, 21 U.S.C. section 360bbb-3(b)(1), unless the authorization is terminated or revoked.     Resp Syncytial Virus by PCR NEGATIVE NEGATIVE Final    Comment: (NOTE) Fact Sheet for Patients: EntrepreneurPulse.com.au  Fact Sheet for Healthcare Providers: IncredibleEmployment.be  This test is not yet approved or cleared by the [place] FDA and has been authorized for detection and/or diagnosis of SARS-CoV-2 by FDA under an Emergency Use Authorization (EUA). This EUA will remain in effect (meaning this test can be used) for the duration of the COVID-19 declaration under Section 564(b)(1) of the Act, 21 U.S.C. section 360bbb-3(b)(1), unless the authorization is terminated or revoked.  Performed at [facility], [place] [address], [place], [place] [address]      Labs: BNP (last 3 results) No results for input(s): "BNP" in the last 8760 hours. Basic Metabolic Panel: Recent Labs  Lab 11/13/22 1820 11/13/22 1829  NA 131* 132*  K 4.3 4.3  CL 98 100  CO2 22  --   GLUCOSE 130* 125*  BUN 18 22  CREATININE 1.01 1.00  CALCIUM 9.5  --    Liver Function Tests: Recent Labs   Lab 11/13/22 1820  AST 33  ALT 27  ALKPHOS 106  BILITOT 1.0  PROT 6.7  ALBUMIN 4.0   No results for input(s): "LIPASE", "AMYLASE" in the last 168 hours. No results for input(s): "AMMONIA" in the last 168 hours. CBC: Recent Labs  Lab 11/13/22 1820 11/13/22 1829  WBC 9.7  --   NEUTROABS 5.6  --   HGB 15.3 15.3  HCT 43.6 45.0  MCV 98.2  --   PLT 254  --    Cardiac Enzymes: No results for input(s): "CKTOTAL", "CKMB", "CKMBINDEX", "TROPONINI" in the last 168 hours. BNP: Invalid input(s): "POCBNP" CBG: Recent Labs  Lab 11/13/22 1817  GLUCAP 122*   D-Dimer No results for input(s): "DDIMER" in the last 72 hours. Hgb A1c Recent Labs    11/13/22 1820  HGBA1C 6.1*   Lipid Profile Recent Labs    11/14/22 0018  CHOL 146  HDL 46  LDLCALC 81  TRIG 96  CHOLHDL 3.2   Thyroid function studies No results for input(s): "TSH", "T4TOTAL", "T3FREE", "THYROIDAB" in the last 72 hours.  Invalid input(s): "FREET3" Anemia work up No results for input(s): "VITAMINB12", "FOLATE", "FERRITIN", "TIBC", "IRON", "RETICCTPCT" in the last 72 hours. Urinalysis    Component Value Date/Time   COLORURINE STRAW (A) 11/13/2022 1937   APPEARANCEUR CLEAR 11/13/2022 1937   LABSPEC 1.029 11/13/2022 1937   PHURINE 7.0 11/13/2022 1937   GLUCOSEU NEGATIVE 11/13/2022 1937   HGBUR NEGATIVE 11/13/2022 [place] NEGATIVE 11/13/2022 1937  KETONESUR NEGATIVE 11/13/2022 1937   PROTEINUR NEGATIVE 11/13/2022 1937   UROBILINOGEN 0.2 05/04/2015 1043   NITRITE NEGATIVE 11/13/2022 1937   LEUKOCYTESUR NEGATIVE 11/13/2022 1937   Sepsis Labs Recent Labs  Lab 11/13/22 1820  WBC 9.7   Microbiology Recent Results (from the past 240 hour(s))  Resp panel by RT-PCR (RSV, Flu A&B, Covid) Anterior Nasal Swab     Status: None   Collection Time: 11/13/22 11:21 PM   Specimen: Anterior Nasal Swab  Result Value Ref Range Status   SARS Coronavirus 2 by RT PCR NEGATIVE NEGATIVE Final   Influenza A  by PCR NEGATIVE NEGATIVE Final   Influenza B by PCR NEGATIVE NEGATIVE Final    Comment: (NOTE) The Xpert Xpress SARS-CoV-2/FLU/RSV plus assay is intended as an aid in the diagnosis of influenza from Nasopharyngeal swab specimens and should not be used as a sole basis for treatment. Nasal washings and aspirates are unacceptable for Xpert Xpress SARS-CoV-2/FLU/RSV testing.  Fact Sheet for Patients: EntrepreneurPulse.com.au  Fact Sheet for Healthcare Providers: IncredibleEmployment.be  This test is not yet approved or cleared by the [place] FDA and has been authorized for detection and/or diagnosis of SARS-CoV-2 by FDA under an Emergency Use Authorization (EUA). This EUA will remain in effect (meaning this test can be used) for the duration of the COVID-19 declaration under Section 564(b)(1) of the Act, 21 U.S.C. section 360bbb-3(b)(1), unless the authorization is terminated or revoked.     Resp Syncytial Virus by PCR NEGATIVE NEGATIVE Final    Comment: (NOTE) Fact Sheet for Patients: EntrepreneurPulse.com.au  Fact Sheet for Healthcare Providers: IncredibleEmployment.be  This test is not yet approved or cleared by the [place] FDA and has been authorized for detection and/or diagnosis of SARS-CoV-2 by FDA under an Emergency Use Authorization (EUA). This EUA will remain in effect (meaning this test can be used) for the duration of the COVID-19 declaration under Section 564(b)(1) of the Act, 21 U.S.C. section 360bbb-3(b)(1), unless the authorization is terminated or revoked.  Performed at [facility], [place] [address], [place], [place] [address]      Time coordinating discharge: 35 minutes  SIGNED:   Aline August, MD  [facility] 11/15/2022, 1:17 PM

## 2022-11-15 NOTE — Procedures (Addendum)
Patient Name: Damon Parrish  MRN: [id number]  Epilepsy Attending: Lora Havens  Referring Physician/Provider: Rosalin Hawking, MD  Duration: 11/14/2022 2314 to 11/15/2022 1125  Patient history: 82yo M with left hand shaking jerking spasm for the last 6mn. EEG to evaluate for seizure  Level of alertness: Awake, asleep  AEDs during EEG study: LEV  Technical aspects: This EEG study was done with scalp electrodes positioned according to the 10-20 International system of electrode placement. Electrical activity was reviewed with band pass filter of 1-'70Hz'$ , sensitivity of 7 uV/mm, display speed of 371msec with a '60Hz'$  notched filter applied as appropriate. EEG data were recorded continuously and digitally stored.  Video monitoring was available and reviewed as appropriate.  Description: The posterior dominant rhythm consists of 9-10 Hz activity of moderate voltage (25-35 uV) seen predominantly in posterior head regions, symmetric and reactive to eye opening and eye closing. Sleep was characterized by vertex waves, sleep spindles (12 to 14 Hz), maximal frontocentral region. EEG showed intermittent 3 to 6 Hz theta-delta slowing in right hemisphere. Generalized low amplitude 12-'15hz'$  beta activity was also noted. Hyperventilation and photic stimulation were not performed.     ABNORMALITY - Intermittent slow, right hemisphere  IMPRESSION: This study is suggestive of cortical dysfunction arising from right hemisphere likely secondary to underlying structural abnormality. No seizures or epileptiform discharges were seen throughout the recording.  Please note lack of epileptiform activity during interictal EEG does not exclude the diagnosis of epilepsy.  Khamari Yousuf O Barbra Sarks

## 2022-11-15 NOTE — Progress Notes (Signed)
Occupational Therapy Treatment Patient Details Name: Damon Parrish MRN: [id number] DOB: Feb 16, 1941 Today's Date: 11/15/2022   History of present illness Pt is 82 yo male who presents on 11/13/22 with sudden onset LUE and LLE weakness with L facial droop. MRI showed R corona radiata CVA and chronic R SDH. 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  Patient engaged in ADLs seated EOB, pt able to don shoes with min assist but requires total assist to tie- increased effort and poor balance dynamically.  He requires +2 min-mod assist for functional mobility in room around bed, pt walking directly into cabinet on L side.  Pt presenting with decreased awareness to L sided deficits, L inattention; visual assessment revealing some difficulty initiation and sustaining scanning on L side but able to locate items on menu on L side with increased time.  Recommend further in depth visual assessment.  Will follow acutely, highly recommend AIR.    Recommendations for follow up therapy are one component of a multi-disciplinary discharge planning process, led by the attending physician.  Recommendations may be updated based on patient status, additional functional criteria and insurance authorization.    Follow Up Recommendations  Acute inpatient rehab (3hours/day)     Assistance Recommended at Discharge Frequent or constant Supervision/Assistance  Patient can return home with the following  A lot of help with walking and/or transfers;A lot of help with bathing/dressing/bathroom;Assistance with cooking/housework;Direct supervision/assist for medications management;Direct supervision/assist for financial management;Assist for transportation;Help with  stairs or ramp for entrance   Equipment Recommendations  BSC/3in1    Recommendations for Other Services Rehab consult    Precautions / Restrictions Precautions Precautions: Fall Precaution Comments: fell 10/03/22 and hasn't really returned to baseline; EEG Restrictions Weight Bearing Restrictions: No Other Position/Activity Restrictions: resting R tremor at baseline       Mobility Bed Mobility Overal bed mobility: Needs Assistance Bed Mobility: Supine to Sit     Supine to sit: Min guard, HOB elevated     General bed mobility comments: guarding for safety, pt required extra effort to complete, HOB elevated and using bed rail.    Transfers Overall transfer level: Needs assistance Equipment used: 2 person hand held assist Transfers: Sit to/from Stand Sit to Stand: Min assist, +2 safety/equipment           General transfer comment: Min+2 for power up from EOB, 2HHA provided and pt stead once standing. BOS slightly wider than shoulder width.     Balance Overall balance assessment: Needs assistance, History of Falls Sitting-balance support: Feet supported, No upper extremity supported Sitting balance-Leahy Scale: Fair Sitting balance - Comments: mild L lean, no LOB EOB Postural control: Left lateral lean Standing balance support: Bilateral upper extremity supported, During functional activity, Reliant on assistive device for balance Standing balance-Leahy Scale: Poor Standing balance comment: L lean in standing with decreased control LLE. Needs up to mod A for safety                           ADL either performed or assessed with clinical judgement   ADL Overall ADL's : Needs assistance/impaired                     Lower Body  Dressing: Moderate assistance;Sit to/from stand;+2 for safety/equipment Lower Body Dressing Details (indicate cue type and reason): require assist to tie shoes, able to pull underwear up in back with support to steady Toilet  Transfer: Minimal assistance;+2 for physical assistance;+2 for safety/equipment;Ambulation Toilet Transfer Details (indicate cue type and reason): bil hand held assist         Functional mobility during ADLs: Minimal assistance;+2 for physical assistance;+2 for safety/equipment;Cueing for sequencing;Cueing for safety General ADL Comments: pt ambulating with +2 HHA around bed, no awareness of cabnient on L side and running straight into it    Extremity/Trunk Assessment              Vision   Vision Assessment?: Yes Eye Alignment: Within Functional Limits Ocular Range of Motion: Within Functional Limits Alignment/Gaze Preference: Within Defined Limits Tracking/Visual Pursuits: Other (comment) (decreased speed and ability to sustain focus on L side) Additional Comments: pt able to locate and read menu items on L side without difficulty, some decreased attention noted   Perception     Praxis Praxis Praxis: Impaired Praxis Impairment Details: Motor planning    Cognition Arousal/Alertness: Awake/alert Behavior During Therapy: WFL for tasks assessed/performed Overall Cognitive Status: Impaired/Different from baseline Area of Impairment: Awareness, Safety/judgement, Following commands, Problem solving                       Following Commands: Follows multi-step commands inconsistently, Follows one step commands consistently, Follows one step commands with increased time Safety/Judgement: Decreased awareness of safety, Decreased awareness of deficits Awareness: Emergent Problem Solving: Slow processing, Requires verbal cues, Difficulty sequencing General Comments: Pt requires increased time for processing and cueing for safety.  He demonstrates decreased awareness to his deficits and need for assist.  Impaired planning for tasks.        Exercises      Shoulder Instructions       General Comments Heel to shin completed and pt with impaired coordination with Lt LE.     Pertinent Vitals/ Pain       Pain Assessment Pain Assessment: Faces Faces Pain Scale: No hurt Pain Intervention(s): Monitored during session  Home Living     Available Help at Discharge: Family;Available 24 hours/day Type of Home: House                              Lives With: Spouse    Prior Functioning/Environment              Frequency  Min 2X/week        Progress Toward Goals  OT Goals(current goals can now be found in the care plan section)  Progress towards OT goals: Progressing toward goals  Acute Rehab OT Goals Patient Stated Goal: home OT Goal Formulation: With patient Time For Goal Achievement: 11/28/22 Potential to Achieve Goals: Good  Plan Discharge plan remains appropriate;Frequency remains appropriate    Co-evaluation      Reason for Co-Treatment: For patient/therapist safety;To address functional/ADL transfers PT goals addressed during session: Mobility/safety with mobility;Balance OT goals addressed during session: ADL's and self-care;Strengthening/ROM      AM-PAC OT "6 Clicks" Daily Activity     Outcome Measure   Help from another person eating meals?: A Little Help from another person taking care of personal grooming?: A Little Help from another person toileting, which includes using toliet, bedpan, or urinal?: A Lot Help from another person bathing (including washing, rinsing, drying)?: A  Lot Help from another person to put on and taking off regular upper body clothing?: A Little Help from another person to put on and taking off regular lower body clothing?: A Lot 6 Click Score: 15    End of Session Equipment Utilized During Treatment: Gait belt;Rolling walker (2 wheels)  OT Visit Diagnosis: Other abnormalities of gait and mobility (R26.89);History of falling (Z91.81);Other symptoms and signs involving cognitive function;Other symptoms and signs involving the nervous system (R29.898)   Activity Tolerance Patient  tolerated treatment well   Patient Left in bed;with call bell/phone within reach;with bed alarm set;with family/visitor present   Nurse Communication Mobility status        Time: [id number] OT Time Calculation (min): 27 min  Charges: OT General Charges $OT Visit: 1 Visit OT Treatments $Self Care/Home Management : 8-22 mins  [place] Office [phone]   Delight Stare 11/15/2022, 1:01 PM

## 2022-11-16 DIAGNOSIS — K5901 Slow transit constipation: Secondary | ICD-10-CM

## 2022-11-16 LAB — CBC WITH DIFFERENTIAL/PLATELET
Abs Immature Granulocytes: 0.01 10*3/uL (ref 0.00–0.07)
Basophils Absolute: 0 10*3/uL (ref 0.0–0.1)
Basophils Relative: 1 %
Eosinophils Absolute: 0.2 10*3/uL (ref 0.0–0.5)
Eosinophils Relative: 3 %
HCT: 40.3 % (ref 39.0–52.0)
Hemoglobin: 14 g/dL (ref 13.0–17.0)
Immature Granulocytes: 0 %
Lymphocytes Relative: 46 %
Lymphs Abs: 3.8 10*3/uL (ref 0.7–4.0)
MCH: 34.1 pg — ABNORMAL HIGH (ref 26.0–34.0)
MCHC: 34.7 g/dL (ref 30.0–36.0)
MCV: 98.3 fL (ref 80.0–100.0)
Monocytes Absolute: 0.7 10*3/uL (ref 0.1–1.0)
Monocytes Relative: 8 %
Neutro Abs: 3.5 10*3/uL (ref 1.7–7.7)
Neutrophils Relative %: 42 %
Platelets: 211 10*3/uL (ref 150–400)
RBC: 4.1 MIL/uL — ABNORMAL LOW (ref 4.22–5.81)
RDW: 12.4 % (ref 11.5–15.5)
WBC: 8.2 10*3/uL (ref 4.0–10.5)
nRBC: 0 % (ref 0.0–0.2)

## 2022-11-16 LAB — COMPREHENSIVE METABOLIC PANEL
ALT: 23 U/L (ref 0–44)
AST: 19 U/L (ref 15–41)
Albumin: 3.3 g/dL — ABNORMAL LOW (ref 3.5–5.0)
Alkaline Phosphatase: 98 U/L (ref 38–126)
Anion gap: 9 (ref 5–15)
BUN: 15 mg/dL (ref 8–23)
CO2: 23 mmol/L (ref 22–32)
Calcium: 8.9 mg/dL (ref 8.9–10.3)
Chloride: 101 mmol/L (ref 98–111)
Creatinine, Ser: 1.07 mg/dL (ref 0.61–1.24)
GFR, Estimated: >60 mL/min (ref >60)
Glucose, Bld: 110 mg/dL — ABNORMAL HIGH (ref 70–99)
Potassium: 4 mmol/L (ref 3.5–5.1)
Sodium: 133 mmol/L — ABNORMAL LOW (ref 135–145)
Total Bilirubin: 0.5 mg/dL (ref 0.3–1.2)
Total Protein: 6.1 g/dL — ABNORMAL LOW (ref 6.5–8.1)

## 2022-11-16 MED ORDER — POLYETHYLENE GLYCOL 3350 17 G PO PACK
17.0000 g | PACK | Freq: Every day | ORAL | Status: DC
  Administered 2022-11-16 – 2022-11-21 (×6): 17 g via ORAL
  Filled 2022-11-16 (×6): qty 1

## 2022-11-16 MED ORDER — SENNOSIDES-DOCUSATE SODIUM 8.6-50 MG PO TABS
2.0000 | ORAL_TABLET | Freq: Every day | ORAL | Status: DC
  Administered 2022-11-16 – 2022-11-20 (×4): 2 via ORAL
  Filled 2022-11-16 (×5): qty 2

## 2022-11-16 MED ORDER — DOCUSATE SODIUM 100 MG PO CAPS
100.0000 mg | ORAL_CAPSULE | Freq: Every day | ORAL | Status: DC
  Administered 2022-11-16 – 2022-11-21 (×6): 100 mg via ORAL
  Filled 2022-11-16 (×6): qty 1

## 2022-11-16 NOTE — Progress Notes (Signed)
PROGRESS NOTE   Subjective/Complaints:  Pt doing ok, slept poorly because of interruptions. LBM was 3-4 days ago, documented as 11/10/22. Urinating well. Denies pain or any other complaints.   ROS: +poor sleep, +constipation. Denies fevers, chills, CP, SOB, abd pain, N/V/D, new/worsening paresthesias/weakness, or any other complaints at this time.    Objective:   EP PPM/ICD IMPLANT  Result Date: 11/15/2022 SURGEON:  Melida Quitter, MD   PREPROCEDURE DIAGNOSIS:  Cryptogenic stroke   POSTPROCEDURE DIAGNOSIS: Cryptogenic stroke    PROCEDURES:  1. Implantable loop recorder implantation   INTRODUCTION:  Damon Parrish presents with a history of cryptogenic stroke The costs of loop recorder monitoring have been discussed with the patient.   DESCRIPTION OF PROCEDURE:  Informed written consent was obtained.  A timeout was performed. The patient required no sedation for the procedure today. The patients left chest was prepped and draped in the usual sterile fashion. The skin overlying the left parasternal region was infiltrated with lidocaine for local analgesia.  A 0.5-cm incision was made over the left parasternal region over the 3rd intercostal space.  A subcutaneous ILR pocket was fashioned using a combination of sharp and blunt dissection.  A [employer] Reveal LINQ implantable loop recorder [id number]) was then placed into the pocket  R waves were very prominent and measured >0.74m.  Steri- Strips and a sterile dressing were then applied.  There were no early apparent complications.   CONCLUSIONS:  1. Successful implantation of a implantable loop recorder for a history of cryptogenic stroke  2. No early apparent complications. AMelida Quitter MD Cardiac Electrophysiology   Overnight EEG with video  Result Date: 11/15/2022 YLora Havens MD     11/15/2022  7:44 PM Patient Name: Damon WESTRUMMRN: [id number] Attending: PLora HavensReferring Physician/Provider: XRosalin Hawking MD Duration: 11/14/2022 2314 to 11/15/2022 1125 Patient history: 82yoM with left hand shaking jerking spasm for the last 245m. EEG to evaluate for seizure Level of alertness: Awake, asleep AEDs during EEG study: LEV Technical aspects: This EEG study was done with scalp electrodes positioned according to the 10-20 International system of electrode placement. Electrical activity was reviewed with band pass filter of 1-70Hz$ , sensitivity of 7 uV/mm, display speed of 3017mec with a 60Hz$  notched filter applied as appropriate. EEG data were recorded continuously and digitally stored.  Video monitoring was available and reviewed as appropriate. Description: The posterior dominant rhythm consists of 9-10 Hz activity of moderate voltage (25-35 uV) seen predominantly in posterior head regions, symmetric and reactive to eye opening and eye closing. Sleep was characterized by vertex waves, sleep spindles (12 to 14 Hz), maximal frontocentral region. EEG showed intermittent 3 to 6 Hz theta-delta slowing in right hemisphere. Generalized low amplitude 12-15hz$  beta activity was also noted. Hyperventilation and photic stimulation were not performed.   ABNORMALITY - Intermittent slow, right hemisphere IMPRESSION: This study is suggestive of cortical dysfunction arising from right hemisphere likely secondary to underlying structural abnormality. No seizures or epileptiform discharges were seen throughout the recording. Please note lack of epileptiform activity during interictal EEG does not exclude the diagnosis of epilepsy. Priyanka O YBarbra SarksECHOCARDIOGRAM COMPLETE  Result Date: 11/14/2022    ECHOCARDIOGRAM REPORT   Patient Name:   Damon Parrish Date of Exam: 11/14/2022 Medical Rec #:  [id number]     Height:       69.0 in Accession #:    [id number]    Weight:       178.8 lb Date of Birth:  Jun 24, 1941      BSA:          1.970 m Patient Age:    82 years      BP:           130/74 mmHg  Patient Gender: M             HR:           67 bpm. Exam Location:  Inpatient Procedure: 2D Echo, Cardiac Doppler and Color Doppler Indications:    R55 Syncope  History:        Patient has no prior history of Echocardiogram examinations.                 Stroke; Risk Factors:Current Smoker and Hypertension.  Sonographer:    Wilkie Aye RVT RCS Referring Phys: [id number] [facility]  Sonographer Comments: No parasternal window, suboptimal subcostal window, Technically challenging study due to limited acoustic windows and Technically difficult study due to poor echo windows. IMPRESSIONS  1. Left ventricular ejection fraction, by estimation, is 60 to 65%. Left ventricular ejection fraction by 3D volume is 64 %. The left ventricle has normal function. The left ventricle has no regional wall motion abnormalities. There is mild left ventricular hypertrophy. Left ventricular diastolic parameters are consistent with Grade I diastolic dysfunction (impaired relaxation).  2. Right ventricular systolic function is normal. The right ventricular size is mildly enlarged. There is normal pulmonary artery systolic pressure. The estimated right ventricular systolic pressure is 99991111 mmHg.  3. The mitral valve is normal in structure. No evidence of mitral valve regurgitation. No evidence of mitral stenosis.  4. The aortic valve was not well visualized. Aortic valve regurgitation is not visualized. No aortic stenosis is present.  5. The inferior vena cava is normal in size with <50% respiratory variability, suggesting right atrial pressure of 8 mmHg. FINDINGS  Left Ventricle: Left ventricular ejection fraction, by estimation, is 60 to 65%. Left ventricular ejection fraction by 3D volume is 64 %. The left ventricle has normal function. The left ventricle has no regional wall motion abnormalities. The left ventricular internal cavity size was normal in size. There is mild left ventricular hypertrophy. Left ventricular diastolic  parameters are consistent with Grade I diastolic dysfunction (impaired relaxation). Right Ventricle: The right ventricular size is mildly enlarged. No increase in right ventricular wall thickness. Right ventricular systolic function is normal. There is normal pulmonary artery systolic pressure. The tricuspid regurgitant velocity is 1.89  m/s, and with an assumed right atrial pressure of 8 mmHg, the estimated right ventricular systolic pressure is 99991111 mmHg. Left Atrium: Left atrial size was normal in size. Right Atrium: Right atrial size was normal in size. Prominent Eustachian valve. Pericardium: There is no evidence of pericardial effusion. Presence of epicardial fat layer. Mitral Valve: The mitral valve is normal in structure. No evidence of mitral valve regurgitation. No evidence of mitral valve stenosis. Tricuspid Valve: The tricuspid valve is normal in structure. Tricuspid valve regurgitation is trivial. No evidence of tricuspid stenosis. Aortic Valve: The aortic valve was not well visualized. Aortic valve regurgitation is not visualized. No aortic stenosis is present. Aortic valve  mean gradient measures 3.0 mmHg. Aortic valve peak gradient measures 6.0 mmHg. Aortic valve area, by VTI measures 2.47 cm. Pulmonic Valve: The pulmonic valve was not well visualized. Aorta: The aortic root is normal in size and structure. Venous: The inferior vena cava is normal in size with less than 50% respiratory variability, suggesting right atrial pressure of 8 mmHg. IAS/Shunts: No atrial level shunt detected by color flow Doppler.  LEFT VENTRICLE PLAX 2D LVIDd:         4.10 cm         Diastology LVIDs:         3.20 cm         LV e' medial:    6.48 cm/s LV PW:         1.10 cm         LV E/e' medial:  9.7 LV IVS:        1.20 cm         LV e' lateral:   7.42 cm/s LVOT diam:     1.80 cm         LV E/e' lateral: 8.4 LV SV:         58 LV SV Index:   29 LVOT Area:     2.54 cm        3D Volume EF                                LV  3D EF:    Left                                             ventricul                                             ar                                             ejection                                             fraction                                             by 3D                                             volume is                                             64 %.  3D Volume EF:                                3D EF:        64 %                                LV EDV:       109 ml                                LV ESV:       39 ml                                LV SV:        70 ml RIGHT VENTRICLE             IVC RV Basal diam:  3.30 cm     IVC diam: 1.90 cm RV S prime:     12.80 cm/s TAPSE (M-mode): 2.0 cm LEFT ATRIUM             Index        RIGHT ATRIUM           Index LA diam:        3.40 cm 1.73 cm/m   RA Area:     11.60 cm LA Vol (A2C):   49.2 ml 24.98 ml/m  RA Volume:   22.00 ml  11.17 ml/m LA Vol (A4C):   32.3 ml 16.40 ml/m LA Biplane Vol: 42.8 ml 21.73 ml/m  AORTIC VALVE AV Area (Vmax):    2.73 cm AV Area (Vmean):   2.52 cm AV Area (VTI):     2.47 cm AV Vmax:           122.00 cm/s AV Vmean:          84.000 cm/s AV VTI:            0.235 m AV Peak Grad:      6.0 mmHg AV Mean Grad:      3.0 mmHg LVOT Vmax:         131.00 cm/s LVOT Vmean:        83.100 cm/s LVOT VTI:          0.228 m LVOT/AV VTI ratio: 0.97  AORTA Ao Root diam: 3.30 cm MITRAL VALVE               TRICUSPID VALVE MV Area (PHT): 3.76 cm    TR Peak grad:   14.3 mmHg MV Decel Time: 202 msec    TR Vmax:        189.00 cm/s MV E velocity: 62.60 cm/s MV A velocity: 84.00 cm/s  SHUNTS MV E/A ratio:  0.75        Systemic VTI:  0.23 m                            Systemic Diam: 1.80 cm Cherlynn Kaiser MD Electronically signed by Cherlynn Kaiser MD Signature Date/Time: 11/14/2022/9:11:28 PM    Final    VAS [place] LOWER EXTREMITY VENOUS (DVT)  Result Date: 11/14/2022  Lower Venous DVT Study Patient Name:  Damon Parrish  Date of Exam:  11/14/2022 Medical Rec #: [id number]      Accession #:    [id number] Date of Birth: 1941/04/03       Patient Gender: M Patient Age:   77 years Exam Location:  [facility] Procedure:      VAS [place] LOWER EXTREMITY VENOUS (DVT) Referring Phys: Cornelius Moras XU --------------------------------------------------------------------------------  Indications: Stroke.  Risk Factors: None identified. Comparison Study: No prior studies. Performing Technologist: Oliver Hum RVT  Examination Guidelines: A complete evaluation includes B-mode imaging, spectral Doppler, color Doppler, and power Doppler as needed of all accessible portions of each vessel. Bilateral testing is considered an integral part of a complete examination. Limited examinations for reoccurring indications may be performed as noted. The reflux portion of the exam is performed with the patient in reverse Trendelenburg.  +---------+---------------+---------+-----------+----------+--------------+ RIGHT    CompressibilityPhasicitySpontaneityPropertiesThrombus Aging +---------+---------------+---------+-----------+----------+--------------+ CFV      Full           Yes      Yes                                 +---------+---------------+---------+-----------+----------+--------------+ SFJ      Full                                                        +---------+---------------+---------+-----------+----------+--------------+ FV Prox  Full                                                        +---------+---------------+---------+-----------+----------+--------------+ FV Mid   Full                                                        +---------+---------------+---------+-----------+----------+--------------+ FV DistalFull                                                        +---------+---------------+---------+-----------+----------+--------------+ PFV      Full                                                         +---------+---------------+---------+-----------+----------+--------------+ POP      Full           Yes      Yes                                 +---------+---------------+---------+-----------+----------+--------------+ PTV      Full                                                        +---------+---------------+---------+-----------+----------+--------------+  PERO     Full                                                        +---------+---------------+---------+-----------+----------+--------------+   +---------+---------------+---------+-----------+----------+--------------+ LEFT     CompressibilityPhasicitySpontaneityPropertiesThrombus Aging +---------+---------------+---------+-----------+----------+--------------+ CFV      Full           Yes      Yes                                 +---------+---------------+---------+-----------+----------+--------------+ SFJ      Full                                                        +---------+---------------+---------+-----------+----------+--------------+ FV Prox  Full                                                        +---------+---------------+---------+-----------+----------+--------------+ FV Mid   Full                                                        +---------+---------------+---------+-----------+----------+--------------+ FV DistalFull                                                        +---------+---------------+---------+-----------+----------+--------------+ PFV      Full                                                        +---------+---------------+---------+-----------+----------+--------------+ POP      Full           Yes      Yes                                 +---------+---------------+---------+-----------+----------+--------------+ PTV      Full                                                         +---------+---------------+---------+-----------+----------+--------------+ PERO     Full                                                        +---------+---------------+---------+-----------+----------+--------------+  Summary: RIGHT: - There is no evidence of deep vein thrombosis in the lower extremity.  - No cystic structure found in the popliteal fossa.  LEFT: - There is no evidence of deep vein thrombosis in the lower extremity.  - No cystic structure found in the popliteal fossa.  *See table(s) above for measurements and observations. Electronically signed by Monica Martinez MD on 11/14/2022 at 2:26:37 PM.    Final    Recent Labs    11/13/22 1820 11/13/22 1829 11/16/22 0606  WBC 9.7  --  8.2  HGB 15.3 15.3 14.0  HCT 43.6 45.0 40.3  PLT 254  --  211   Recent Labs    11/13/22 1820 11/13/22 1829 11/16/22 0606  NA 131* 132* 133*  K 4.3 4.3 4.0  CL 98 100 101  CO2 22  --  23  GLUCOSE 130* 125* 110*  BUN 18 22 15  $ CREATININE 1.01 1.00 1.07  CALCIUM 9.5  --  8.9    Intake/Output Summary (Last 24 hours) at 11/16/2022 0825 Last data filed at 11/16/2022 0700 Gross per 24 hour  Intake 180 ml  Output 650 ml  Net -470 ml        Physical Exam: Vital Signs Blood pressure (!) 142/87, pulse 61, temperature (!) 97.5 F (36.4 C), resp. rate 15, height 5' 9"$  (1.753 m), weight 81.8 kg, SpO2 95 %.  Constitutional: No apparent distress. Appropriate appearance for age. Laying in bed HENT: No JVD. Neck Supple. Trachea midline. Atraumatic, normocephalic. Eyes: PERRLA. EOMI. Visual fields grossly intact.  Cardiovascular: RRR, no murmurs/rub/gallops. No Edema. Peripheral pulses 2+  Respiratory: CTAB. Mildly decreased breath sounds throughout. No rales, rhonchi, or wheezing. On RA.  Abdomen: + bowel sounds, normoactive. No distention or tenderness.  Skin: C/D/I. No apparent lesions.  PRIOR EXAM: MSK:      No apparent deformity.      Strength:                RUE: 5/5 SA, 5/5  EF, 5/5 EE, 5/5 WE, 5/5 FF, 5/5 FA                 LUE: 5-/5 SA, 5-/5 EF, 5-/5 EE, 5-/5 WE, 5-/5 FF, 5-/5 FA                 RLE: 5/5 HF, 5/5 KE, 5/5 DF, 5/5 EHL, 5/5 PF                 LLE:  5/5 HF, 5/5 KE, 5/5 DF, 5/5 EHL, 5/5 PF    Neurologic exam:  Cognition: AAO to person, place, time and event.  Language: Fluent, No substitutions or neoglisms. No dysarthria. Names 3/3 objects correctly.  Memory: Recalls 1/3 objects at 5 minutes. No apparent deficits  Insight: Good insight into current condition.  Mood: Pleasant affect, appropriate mood.  Sensation: To light touch intact in BL UEs and LEs  Reflexes: 2+ in BL UE and LEs. Negative Hoffman's and babinski signs bilaterally.  CN: 2-12 grossly intact.  Coordination: No apparent tremors. + LUE ataxia on FTN, none on HTS bilaterally.  Spasticity: MAS 0 in all extremities.   Assessment/Plan: 1. Functional deficits which require 3+ hours per day of interdisciplinary therapy in a comprehensive inpatient rehab setting. Physiatrist is providing close team supervision and 24 hour management of active medical problems listed below. Physiatrist and rehab team continue to assess barriers to discharge/monitor patient progress toward functional and medical goals  Care Tool:  Bathing  Bathing assist       Upper Body Dressing/Undressing Upper body dressing        Upper body assist      Lower Body Dressing/Undressing Lower body dressing            Lower body assist       Toileting Toileting    Toileting assist       Transfers Chair/bed transfer  Transfers assist           Locomotion Ambulation   Ambulation assist              Walk 10 feet activity   Assist           Walk 50 feet activity   Assist           Walk 150 feet activity   Assist           Walk 10 feet on uneven surface  activity   Assist           Wheelchair     Assist                Wheelchair 50 feet with 2 turns activity    Assist            Wheelchair 150 feet activity     Assist          Blood pressure (!) 142/87, pulse 61, temperature (!) 97.5 F (36.4 C), resp. rate 15, height 5' 9"$  (1.753 m), weight 81.8 kg, SpO2 95 %.  Medical Problem List and Plan: 1. Functional deficits secondary to R ischemic CVA with L hemiparesis             -patient may shower             -ELOS/Goals: 10-12 days, Supervision OT/PT/SLP  -Cont CIR   2.  Antithrombotics: -DVT/anticoagulation:  Pharmaceutical: Lovenox 62m QD  -11/16/22 Dopplers negative on 11/14/22 -antiplatelet therapy: aspirin 880mQD and Plavix 7518mD for three weeks followed by Plavix alone (stop aspirin 3/14)   3. Pain Management: Tylenol as needed, Robaxin 500m39mh PRN   4. Mood/Behavior/Sleep: LCSW to evaluate and provide emotional support             -antipsychotic agents: n/a  -Trazodone PRN   5. Neuropsych/cognition: This patient is capable of making decisions on his own behalf.   6. Skin/Wound Care: Routine skin care checks   7. 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 AquinoDelice Lesch       -Keppra 500 mg BID -11/16/22 EEG overnight 11/15/22 suggestive of cortical dysfunction arising from right hemisphere likely secondary to underlying structural abnormality. No seizures or epileptiform discharges were seen throughout the recording.   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

## 2022-11-16 NOTE — Plan of Care (Signed)
  Problem: Consults Goal: RH STROKE PATIENT EDUCATION Description: See Patient Education module for education specifics  Outcome: Progressing   Problem: RH BLADDER ELIMINATION Goal: RH STG MANAGE BLADDER WITH ASSISTANCE Description: STG Manage Bladder With mod I  Assistance Outcome: Progressing Goal: RH STG MANAGE BLADDER WITH MEDICATION WITH ASSISTANCE Description: STG Manage Bladder With Medication With mod I Assistance. Outcome: Progressing   Problem: RH SAFETY Goal: RH STG ADHERE TO SAFETY PRECAUTIONS W/ASSISTANCE/DEVICE Description: STG Adhere to Safety Precautions With cues Assistance/Device. Outcome: Progressing   Problem: RH KNOWLEDGE DEFICIT Goal: RH STG INCREASE KNOWLEDGE OF DIABETES Description: Patient and family will be able to manage prediabetes with  dietary modifications using educational resources independently Outcome: Progressing Goal: RH STG INCREASE KNOWLEDGE OF HYPERTENSION Description: Patient and family will be able to manage HTN with medication and dietary modifications using educational resources independently Outcome: Progressing Goal: RH STG INCREASE KNOWLEGDE OF HYPERLIPIDEMIA Description: Patient and family will be able to manage HLD with medication and dietary modifications using educational resources independently Outcome: Progressing Goal: RH STG INCREASE KNOWLEDGE OF STROKE PROPHYLAXIS Description: Patient and family will be able to manage secondary risks with medication and dietary modifications using educational resources independently Outcome: Progressing   Problem: Education: Goal: Knowledge of disease or condition will improve Outcome: Progressing Goal: Knowledge of secondary prevention will improve (MUST DOCUMENT ALL) Outcome: Progressing Goal: Knowledge of patient specific risk factors will improve [place] N/A or DELETE if not current risk factor) Outcome: Progressing   Problem: Ischemic Stroke/TIA Tissue Perfusion: Goal: Complications  of ischemic stroke/TIA will be minimized Outcome: Progressing   Problem: Coping: Goal: Will verbalize positive feelings about self Outcome: Progressing Goal: Will identify appropriate support needs Outcome: Progressing   Problem: Health Behavior/Discharge Planning: Goal: Ability to manage health-related needs will improve Outcome: Progressing Goal: Goals will be collaboratively established with patient/family Outcome: Progressing   Problem: Self-Care: Goal: Ability to participate in self-care as condition permits will improve Outcome: Progressing Goal: Verbalization of feelings and concerns over difficulty with self-care will improve Outcome: Progressing Goal: Ability to communicate needs accurately will improve Outcome: Progressing   Problem: Nutrition: Goal: Risk of aspiration will decrease Outcome: Progressing Goal: Dietary intake will improve Outcome: Progressing

## 2022-11-16 NOTE — Evaluation (Signed)
Occupational Therapy Assessment and Plan  Patient Details  Name: Damon Parrish MRN: [id number] Date of Birth: October 10, 1940  OT Diagnosis: apraxia, cognitive deficits, disturbance of vision, hemiplegia affecting non-dominant side, and muscle weakness (generalized) Rehab Potential: Rehab Potential (ACUTE ONLY): Good ELOS: 10-14 days   Today's Date: 11/16/2022 OT Individual Time: [id number] OT Individual Time Calculation (min): 71 min     Hospital Problem: Principal Problem:   Acute right MCA stroke ([name])   Past Medical History:  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   GERD (gastroesophageal reflux disease)    H/O cerebral aneurysm repair 2016   "put stent in"   High cholesterol    History of blood transfusion ~ 1950   "while in hospital w/pneumonia" (01/09/2018)   History of kidney stones    Hypertension    Nonruptured cerebral aneurysm    Pneumonia ~ 1950   Red man syndrome    Seizures ([place]) 12/27/2015; 12/28/2015   Stroke ([place]) 12/2015   denies residual on 01/09/2018)   Past Surgical History:  Past Surgical History:  Procedure Laterality Date   CATARACT EXTRACTION W/ INTRAOCULAR LENS  IMPLANT, BILATERAL Bilateral    COLONOSCOPY     2013 per patient: done in [place], normal, next colonoscopy due in 10 years   FOOT FRACTURE SURGERY Left    "pins, etc. in there"   [place] N/A 05/04/2015   Procedure: Pipeline Embolization;  Surgeon: Consuella Lose, MD;  Location: [place] [facility];  Service: Radiology;  Laterality: N/A;   RADIOLOGY WITH ANESTHESIA N/A 05/31/2015   Procedure: Pipeline Embolization;  Surgeon: Consuella Lose, MD;  Location: [facility];  Service: Radiology;  Laterality: N/A;    Assessment & Plan Clinical Impression: Damon Parrish is an 82 year old male who presented to [facility]  ER on 11/13/2022 onset of left-sided weakness. Code stroke was initiated.  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  He awoke on the morning of presentation in his normal state of health.  He went back to sleep and woke up approximately 4 hours later and noticed he was clumsy in his left hand and left leg was weak and shaky.  He is maintained on aspirin 325 mg.  He was out of the window for tenecteplase.  Imaging revealed chronic relatively stable subdural low-attenuation collection.  CTA of head and neck with no LVO.  MRI of the brain revealed small area of acute or early subacute ischemia within the right corona radiata.   He has history of both strokes and seizures and was seen in April 2017 at [facility] with patchy acute ischemia of the right frontoparietal lobes in the MCA territory infarct.  He recovered well and was followed up with neurology in 2018.  He developed seizures at this same timeframe and was put on Keppra and titrated off.  However in 2020 he was seen again for abnormal left lower extremity movements that were thought to be focal seizures and Keppra was resumed at 500 mg twice daily.  He is now on aspirin and Plavix and recommend duration for 3 weeks then Plavix alone.  As part of his workup, which included CTA of the neck, an  11 mm nodule in the medial right upper lobe was noted.  He underwent CT scan of the chest with recommendations to follow-up as an outpatient with repeat CT scan of chest in 3 months with or without PET scanning or tissue sampling.  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  He has a history of hypertension maintained on Norvasc and this is held to allow permissive hypertension.  He takes Lipitor 40 mg daily for hyperlipidemia and is now on high intensity therapy of 80  mg daily.  He will have loop recorder placed today.  He requires +2 min to mod assist for functional mobility.  He is tolerating a regular diet.The patient requires inpatient physical medicine and rehabilitation evaluations and treatment secondary to dysfunction due to right punctate MCA/ACA watershed infarcts. Patient transferred to CIR on 11/15/2022 .    Patient currently requires min with basic self-care skills secondary to muscle weakness, decreased cardiorespiratoy endurance, impaired timing and sequencing, unbalanced muscle activation, motor apraxia, decreased coordination, and decreased motor planning, decreased visual perceptual skills and decreased visual motor skills, decreased attention to left and decreased motor planning, decreased initiation, decreased attention, decreased awareness, decreased problem solving, decreased safety awareness, decreased memory, and delayed processing, and decreased sitting balance, decreased standing balance, decreased postural control, hemiplegia, and decreased balance strategies.  Prior to hospitalization, patient could complete BADLs and IADLs with modified independent .  Patient will benefit from skilled intervention to decrease level of assist with basic self-care skills, increase independence with basic self-care skills, and increase level of independence with iADL prior to discharge home with care partner.  Anticipate patient will require 24 hour supervision and follow up outpatient.  OT - End of Session Activity Tolerance: Decreased this session Endurance Deficit: Yes OT Assessment Rehab Potential (ACUTE ONLY): Good OT Patient demonstrates impairments in the following area(s): Balance;Behavior;Perception;Safety;Cognition;Skin Integrity;Endurance;Vision;Motor OT Basic ADL's Functional Problem(s): Grooming;Bathing;Toileting;Dressing OT Advanced ADL's Functional Problem(s): Light Housekeeping;Simple Meal Preparation OT Transfers Functional Problem(s):  Tub/Shower;Toilet OT Plan OT Intensity: Minimum of 1-2 x/day, 45 to 90 minutes OT Frequency: 5 out of 7 days OT Duration/Estimated Length of Stay: 10-14 days OT Treatment/Interventions: Balance/vestibular training;Discharge planning;Pain management;Self Care/advanced ADL retraining;Therapeutic Activities;UE/LE Coordination activities;Cognitive remediation/compensation;Disease mangement/prevention;Functional mobility training;Patient/family education;Skin care/wound managment;Therapeutic Exercise;Visual/perceptual remediation/compensation;Community reintegration;DME/adaptive equipment instruction;Neuromuscular re-education;Psychosocial support;Splinting/orthotics;UE/LE Strength taining/ROM OT Self Feeding Anticipated Outcome(s): Independent OT Basic Self-Care Anticipated Outcome(s): Supervision OT Toileting Anticipated Outcome(s): Supervision OT Bathroom Transfers Anticipated Outcome(s): Supervision OT Recommendation Recommendations for Other Services: Therapeutic Recreation consult Therapeutic Recreation Interventions: Pet therapy;Kitchen group;Stress management (Yoga) Patient destination: Home Follow Up Recommendations: Outpatient OT Equipment Recommended: To be determined;Other (comment) (may need shower seat with back)   OT Evaluation Precautions/Restrictions  Precautions Precautions: Fall Precaution Comments: Loop recorder, fall, decreased safety awareness Restrictions Weight Bearing Restrictions: No Other Position/Activity Restrictions: resting R tremor at baseline General Chart Reviewed: Yes Additional Pertinent History: ICA aneurysm repair in 2016, chronic subdural fluid ollections, seizures, MCA R frontoparietal infarct Family/Caregiver Present: No Vital Signs Therapy Vitals Temp: (Abnormal) 97.5 F (36.4 C) Temp Source: Oral Pulse Rate: (Abnormal) 59 Resp: 16 BP: 121/73 Patient Position (if appropriate): Lying Oxygen Therapy SpO2: 96 % O2 Device: Room Air Pain Pain  Assessment Pain Scale: 0-10 Pain Score: 0-No pain Home Living/Prior Functioning Home Living Family/patient expects to be discharged to:: Private residence Living Arrangements: Spouse/significant other Available Help at Discharge: Family, Available 24 hours/day Type of Home: House Home Access: Stairs to enter [employer]  of Steps: 1 Entrance Stairs-Rails: None Home Layout: One level Bathroom Shower/Tub: Multimedia programmer: Standard Bathroom Accessibility: Yes Additional Comments: retired from working at newspaper in [employer] With: [place] Responsibilities: Yes Meal Prep Responsibility: Building surveyor Responsibility: Secondary Cleaning Responsibility: Secondary Bill Paying/Finance Responsibility: No Shopping Responsibility: No Child Care Responsibility: No Current License: Yes Mode of Transportation: Public relations account executive: high school and some college Occupation: Retired Type of Occupation: retired from working at [employer] in [place] for independence, Independent with basic ADLs ([place] or [place])  Able to [place]?: Yes Driving: Yes 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[employer]) [place] Vision/History: 1 Wears glasses Ability to See in Adequate Light: 0 Adequate Patient Visual Report: No change from baseline Vision Assessment?: Yes Eye Alignment: Within Functional Limits Ocular Range of Motion: Within Functional Limits Alignment/Gaze Preference: Within Defined Limits Tracking/Visual Pursuits: Other (comment) (decreased speed and ability to maintain focus on L side) Saccades: Within functional limits Convergence: Impaired - to be further tested in functional context (no double vision reported, L eye slow to converge; will continue to assess) Visual Fields: No apparent deficits Depth Perception: Undershoots Additional Comments: Pt able to  read therapist name tag and clock in room without difficultly without glassess, some decreased attention Perception  Perception: Impaired Inattention/Neglect: Impaired-to be further tested in functional context (mild decreased attention to L side) Praxis Praxis: Impaired Praxis Impairment Details: Motor planning;Ideomotor Cognition Cognition Overall Cognitive Status: No family/caregiver present to determine baseline cognitive functioning Arousal/Alertness: Awake/alert Orientation Level: Person;Place;Situation Person: Oriented Place: Oriented Situation: Oriented Memory: Impaired Memory Impairment: Storage deficit;Retrieval deficit Attention: Sustained Sustained Attention: Appears intact Awareness: Impaired Awareness Impairment: Emergent impairment Problem Solving: Impaired Problem Solving Impairment: Verbal complex;Functional basic Executive Function: Reasoning;Decision Making Reasoning: Impaired Reasoning Impairment: Verbal complex;Functional basic Decision Making: Impaired Decision Making Impairment: Verbal complex;Functional basic Behaviors: Impulsive Safety/Judgment: Impaired Brief Interview for Mental Status (BIMS) Repetition of Three Words (First Attempt): 3 Temporal Orientation: Year: Correct Temporal Orientation: Month: Accurate within 5 days Temporal Orientation: Day: Correct Recall: "Sock": Yes, no cue required Recall: "Blue": Yes, no cue required Recall: "Bed": Yes, no cue required BIMS Summary Score: 15 Sensation Sensation Light Touch: Appears Intact Hot/Cold: Appears Intact Proprioception: Not tested Stereognosis: Not tested Coordination Gross Motor Movements are Fluid and Coordinated: No Fine Motor Movements are Fluid and Coordinated: No Finger Nose Finger Test: Mild undershooting and dysmetria on L side Motor  Motor Motor: Hemiplegia;Motor apraxia Motor - Skilled Clinical Observations: LLE>LUE  Trunk/Postural Assessment  Cervical  Assessment Cervical Assessment: Exceptions to [facility] (forward head) Thoracic Assessment Thoracic Assessment: Exceptions to [facility] (mildly rounded shoulders) Lumbar Assessment Lumbar Assessment: Exceptions to [facility] Postural Control Postural Control: Deficits on evaluation Righting Reactions: decreasedd Protective Responses: delayed  Balance Balance Balance Assessed: Yes Static Sitting Balance Static Sitting - Balance Support: Feet supported Static Sitting - Level of Assistance: 5: Stand by assistance Dynamic Sitting Balance Dynamic Sitting - Balance Support: Feet supported Dynamic Sitting - Level of Assistance: 5: Stand by assistance Dynamic Sitting Balance - Compensations: posterior lean when donnint socks Static Standing Balance Static Standing - Balance Support: During functional activity;Bilateral upper extremity supported Static Standing - Level of Assistance: 4: Min assist (CGA) Dynamic Standing Balance Dynamic Standing - Balance Support: Bilateral upper extremity supported Dynamic Standing - Level of Assistance: 4: Min assist Dynamic Standing - Balance Activities:  (ADLs) Extremity/Trunk Assessment RUE Assessment RUE Assessment: Within Functional Limits Passive Range of Motion (PROM)  Comments: WFL Active Range of Motion (AROM) Comments: WFL General Strength Comments: WFL LUE Assessment LUE Assessment: Exceptions to [facility] Passive Range of Motion (PROM) Comments: WFL Active Range of Motion (AROM) Comments: WFL General Strength Comments: 4/5  Care Tool Care Tool Self Care Eating   Eating Assist Level: Independent    Oral Care    Oral Care Assist Level: Contact Guard/Toucning assist (standing at sink)    Bathing   Body parts bathed by patient: Right arm;Left arm;Chest;Abdomen;Front perineal area;Buttocks;Right upper leg;Left upper leg;Right lower leg;Left lower leg;Face     Assist Level: Contact Guard/Touching assist (standing with RW/ sitting in wc at sink)    Upper Body  Dressing(including orthotics)   What is the patient wearing?: Pull over shirt   Assist Level: Supervision/Verbal cueing    Lower Body Dressing (excluding footwear)   What is the patient wearing?: Underwear/pull up;Pants Assist for lower body dressing: Minimal Assistance - Patient > 75%    Putting on/Taking off footwear   What is the patient wearing?: [place] for footwear: Contact Guard/Touching assist       Care Tool Toileting Toileting activity   Assist for toileting: Minimal Assistance - Patient > 75%     Care Tool Bed Mobility Roll left and right activity   Roll left and right assist level: Contact Guard/Touching assist    Sit to lying activity   Sit to lying assist level: Contact Guard/Touching assist    Lying to sitting on side of bed activity   Lying to sitting on side of bed assist level: the ability to move from lying on the back to sitting on the side of the bed with no back support.: Contact Guard/Touching assist     Care Tool Transfers Sit to stand transfer   Sit to stand assist level: Contact Guard/Touching assist    Chair/bed transfer   Chair/bed transfer assist level: Minimal Assistance - Patient > 75%     Toilet transfer   Assist Level: Minimal Assistance - Patient > 75%     Care Tool Cognition  Expression of Ideas and Wants Expression of Ideas and Wants: 3. Some difficulty - exhibits some difficulty with expressing needs and ideas (e.g, some words or finishing thoughts) or speech is not clear  Understanding Verbal and Non-Verbal Content Understanding Verbal and Non-Verbal Content: 3. Usually understands - understands most conversations, but misses some part/intent of message. Requires cues at times to understand   Memory/Recall Ability Memory/Recall Ability : Current season;That he or she is in a hospital/hospital unit   Refer to Care Plan for [place] 1 OT Short Term Goal 1 (Week 1): Pt will complete LB dressing  with CGA consistently using LRAD OT Short Term Goal 2 (Week 1): Pt will demonstrate increased safety awareness during BADLs/Functional tasks by recalling 3/3 learned strategies OT Short Term Goal 3 (Week 1): Pt will maintian attention on functional task/BADLs in noisey environment ~5 minutes with min cueing  Recommendations for other services: Therapeutic Recreation  Pet therapy, Kitchen group, Stress management, Outing/community reintegration, and Other Yoga    Skilled Therapeutic Intervention Skilled Therapeutic Intervention/Progress Updates:  1:1 OT evaluation and intervention initiated with education provided on OT role, goals, and POC. Pt received resting in bed in good spirits and receptive to skilled OT session. Pt able to complete BADLs at levels listed below with min HHA required for functional mobility/transfers or CGA using RW. Pt demonstrating deficits in his balance, coordination, and cognition  limiting his independence and safety in completing BADLs. Pt requiring VB cueing for safety, attention, and memory during BADLs this session. Pt would benefit from continued OT services in IPR to increase independence and safety with ADLs and IADL tasks.  ADL ADL Eating: Supervision/safety Where Assessed-Eating: Bed level Grooming: Contact guard Where Assessed-Grooming: Standing at sink (RW) Upper Body Bathing: Supervision/safety Where Assessed-Upper Body Bathing: Standing at sink Lower Body Bathing: Contact guard Where Assessed-Lower Body Bathing: Sitting at sink;Wheelchair;Standing at sink Upper Body Dressing: Supervision/safety;Minimal cueing Where Assessed-Upper Body Dressing: Edge of bed Lower Body Dressing: Minimal assistance;Contact guard Where Assessed-Lower Body Dressing: Edge of bed Toileting: Minimal assistance Where Assessed-Toileting: Glass blower/designer: Surveyor, quantity Method: Ambulating Dietitian) Science writer: Architectural technologist: Not assessed Social research officer, government: Minimal assistance (RW) Social research officer, government Method: Heritage manager: Civil engineer, contracting with back Mobility  Bed Mobility Bed Mobility: Rolling Right;Rolling Left;Supine to Sit;Sit to Supine Rolling Right: Supervision/verbal cueing Rolling Left: Supervision/Verbal cueing Supine to Sit: Contact Guard/Touching assist Sit to Supine: Contact Guard/Touching assist Transfers Sit to Stand: Contact Guard/Touching assist;Minimal Assistance - Patient > 75% Stand to Sit: Contact Guard/Touching assist;Minimal Assistance - Patient > 75%   Discharge Criteria: Patient will be discharged from OT if patient refuses treatment 3 consecutive times without medical reason, if treatment goals not met, if there is a change in medical status, if patient makes no progress towards goals or if patient is discharged from hospital.  The above assessment, treatment plan, treatment alternatives and goals were discussed and mutually agreed upon: by patient  Janey Genta 11/16/2022, 12:32 PM

## 2022-11-16 NOTE — Plan of Care (Signed)
  Problem: RH Problem Solving Goal: LTG Patient will demonstrate problem solving for (SLP) Description: LTG:  Patient will demonstrate problem solving for basic/complex daily situations with cues  (SLP) Flowsheets (Taken 11/16/2022 1543) LTG: Patient will demonstrate problem solving for (SLP): Complex daily situations LTG Patient will demonstrate problem solving for: Supervision   Problem: RH Memory Goal: LTG Patient will use memory compensatory aids to (SLP) Description: LTG:  Patient will use memory compensatory aids to recall biographical/new, daily complex information with cues (SLP) Flowsheets (Taken 11/16/2022 1543) LTG: Patient will use memory compensatory aids to (SLP): Supervision   Problem: RH Awareness Goal: LTG: Patient will demonstrate awareness during functional activites type of (SLP) Description: LTG: Patient will demonstrate awareness during functional activites type of (SLP) Flowsheets (Taken 11/16/2022 1543) Patient will demonstrate during cognitive/linguistic activities awareness type of: Anticipatory LTG: Patient will demonstrate awareness during cognitive/linguistic activities with assistance of (SLP): Supervision

## 2022-11-16 NOTE — Evaluation (Signed)
Physical Therapy Assessment and Plan  Patient Details  Name: Damon Parrish MRN: [id number] Date of Birth: 12-28-1940  PT Diagnosis: Abnormal posture, Abnormality of gait, Cognitive deficits, Difficulty walking, Hemiparesis non-dominant, Impaired cognition, and Muscle weakness Rehab Potential: Good ELOS: ~2 weeks   Today's Date: 11/16/2022 PT Individual Time: [id number] PT Individual Time Calculation (min): 71 min    Hospital Problem: Principal Problem:   Acute right MCA stroke [facility])   Past Medical History:  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   GERD (gastroesophageal reflux disease)    H/O cerebral aneurysm repair 2016   "put stent in"   High cholesterol    History of blood transfusion ~ 1950   "while in hospital w/pneumonia" (01/09/2018)   History of kidney stones    Hypertension    Nonruptured cerebral aneurysm    Pneumonia ~ 1950   Red man syndrome    Seizures ([place]) 12/27/2015; 12/28/2015   Stroke ([place]) 12/2015   denies residual on 01/09/2018)   Past Surgical History:  Past Surgical History:  Procedure Laterality Date   CATARACT EXTRACTION W/ INTRAOCULAR LENS  IMPLANT, BILATERAL Bilateral    COLONOSCOPY     2013 per patient: done in [place], normal, next colonoscopy due in 10 years   FOOT FRACTURE SURGERY Left    "pins, etc. in there"   FRACTURE SURGERY     RADIOLOGY WITH ANESTHESIA N/A 05/04/2015   Procedure: Pipeline Embolization;  Surgeon: Consuella Lose, MD;  Location: [place] NEURO ORS;  Service: Radiology;  Laterality: N/A;   RADIOLOGY WITH ANESTHESIA N/A 05/31/2015   Procedure: Pipeline Embolization;  Surgeon: Consuella Lose, MD;  Location: [place];  Service: Radiology;  Laterality: N/A;    Assessment & Plan Clinical Impression: Patient is a 82 y.o. year old male who presented to [facility] ER on 11/13/2022  onset of left-sided weakness. Code stroke was initiated.  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  He awoke on the morning of presentation in his normal state of health.  He went back to sleep and woke up approximately 4 hours later and noticed he was clumsy in his left hand and left leg was weak and shaky.  He is maintained on aspirin 325 mg.  He was out of the window for tenecteplase.  Imaging revealed chronic relatively stable subdural low-attenuation collection.  CTA of head and neck with no LVO.  MRI of the brain revealed small area of acute or early subacute ischemia within the right corona radiata.   He has history of both strokes and seizures and was seen in April 2017 at [facility] with patchy acute ischemia of the right frontoparietal lobes in the MCA territory infarct.  He recovered well and was followed up with neurology in 2018.  He developed seizures at this same timeframe and was put on Keppra and titrated off.  However in 2020 he was seen again for abnormal left lower extremity movements that were thought to be focal seizures and Keppra was resumed at 500 mg twice daily.  He is now on aspirin and Plavix and recommend duration for 3 weeks then Plavix alone.  As part of his workup, which included CTA of the neck, an 11  mm nodule in the medial right upper lobe was noted.  He underwent CT scan of the chest with recommendations to follow-up as an outpatient with repeat CT scan of chest in 3 months with or without PET scanning or tissue sampling.  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  He has a history of hypertension maintained on Norvasc and this is held to allow permissive hypertension.  He takes Lipitor 40 mg daily for hyperlipidemia and is now on high intensity therapy of 80 mg daily.  He  will have loop recorder placed today.  He requires +2 min to mod assist for functional mobility.  He is tolerating a regular diet.The patient requires inpatient physical medicine and rehabilitation evaluations and treatment secondary to dysfunction due to right punctate MCA/ACA watershed infarcts. Patient transferred to CIR on 11/15/2022 .   Patient currently requires min assist with mobility secondary to muscle weakness, decreased cardiorespiratoy endurance, unbalanced muscle activation and decreased motor planning, decreased attention to left, decreased problem solving, decreased safety awareness, and decreased memory, and decreased standing balance, decreased postural control, and decreased balance strategies.  Prior to hospitalization, patient was modified independent  with mobility and lived with Spouse (wife, connie) in a House home.  Home access is 1Stairs to enter.  Patient will benefit from skilled PT intervention to maximize safe functional mobility, minimize fall risk, and decrease caregiver burden for planned discharge home with 24 hour supervision.  Anticipate patient will benefit from follow up OP at discharge.  PT - End of Session Activity Tolerance: Tolerates 30+ min activity with multiple rests Endurance Deficit: Yes Endurance Deficit Description: requires seated rest breaks PT Assessment Rehab Potential (ACUTE/IP ONLY): Good PT Patient demonstrates impairments in the following area(s): Balance;Perception;Behavior;Safety;Edema;Endurance;Skin Integrity;Motor;Nutrition;Pain PT Transfers Functional Problem(s): Bed Mobility;Bed to Chair;Car;Furniture;Floor PT Locomotion Functional Problem(s): Ambulation;Stairs PT Plan PT Intensity: Minimum of 1-2 x/day ,45 to 90 minutes PT Frequency: 5 out of 7 days PT Duration Estimated Length of Stay: ~2 weeks PT Treatment/Interventions: Ambulation/gait training;Community reintegration;DME/adaptive equipment instruction;Neuromuscular  re-education;Psychosocial support;Stair training;UE/LE Strength taining/ROM;Balance/vestibular training;Discharge planning;Functional electrical stimulation;Pain management;Skin care/wound management;Therapeutic Activities;UE/LE Coordination activities;Cognitive remediation/compensation;Disease management/prevention;Functional mobility training;Patient/family education;Splinting/orthotics;Therapeutic Exercise;Visual/perceptual remediation/compensation PT Transfers Anticipated Outcome(s): supervision using LRAD PT Locomotion Anticipated Outcome(s): supervision using LRAD PT Recommendation Recommendations for Other Services: Therapeutic Recreation consult Therapeutic Recreation Interventions: Outing/community reintergration Follow Up Recommendations: Outpatient PT;24 hour supervision/assistance Patient destination: Home Equipment Recommended: To be determined   PT Evaluation Precautions/Restrictions Precautions Precautions: Fall;Other (comment) Precaution Comments: L hemi, mild impulsivity, monitor HR, hx of seizures, loop recorder Restrictions Weight Bearing Restrictions: No Other Position/Activity Restrictions: resting R tremor at baseline Pain Pain Assessment Pain Scale: 0-10 Pain Score: 0-No pain Pain Interference Pain Interference Pain Effect on Sleep: 0. Does not apply - I have not had any pain or hurting in the past 5 days Pain Interference with Therapy Activities: 1. Rarely or not at all Pain Interference with Day-to-Day Activities: 1. 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) Vision/Perception  Vision - History Ability to See in Adequate Light: 0 Adequate Perception Perception: Impaired Inattention/Neglect: Impaired-to be further tested in functional context (mild decreased attention to L side) Praxis Praxis: Impaired Praxis Impairment Details: Motor planning;Ideomotor  Cognition Overall Cognitive Status: Within Functional Limits for tasks assessed (pt's wife reports no noticeable change in his cognition) Arousal/Alertness: Awake/alert Orientation Level: Oriented X4 Year: 2024 Month: February Day of Week: Correct Attention: Focused;Sustained Focused Attention: Appears intact Sustained Attention: Appears intact Behaviors: Impulsive Safety/Judgment: Impaired Sensation Sensation Light Touch: Appears Intact Hot/Cold: Not tested Proprioception: Appears Intact Stereognosis: Not tested Coordination Gross Motor Movements are Fluid and Coordinated: No Fine Motor Movements are Fluid and Coordinated: No Coordination and Movement Description: mild L hemiparesis Motor  Motor Motor: Hemiplegia;Motor apraxia Motor - Skilled Clinical Observations: very mild L hemiparesis LE>UE   Trunk/Postural Assessment  Cervical Assessment Cervical Assessment: Exceptions to WFL (slight forward head) Thoracic Assessment Thoracic Assessment: Exceptions to [facility] (mildly rounded shoulders) Lumbar Assessment Lumbar Assessment: Exceptions to [facility] Postural Control Postural Control: Deficits on evaluation Righting Reactions: decreased Protective Responses: delayed  Balance Balance Balance Assessed: Yes Standardized Balance Assessment Standardized Balance Assessment: Berg Balance Test Berg Balance Test Sit to Stand: Able to stand  independently using hands Standing  Unsupported: Able to stand 2 minutes with supervision (stands with wide BOS throughout all test items when foot placement is not specified) Sitting with Back Unsupported but Feet Supported on Floor or Stool: Able to sit 2 minutes under supervision Stand to Sit: Uses backs of legs against chair to control descent Transfers: Able to transfer with verbal cueing and /or supervision Standing Unsupported with Eyes Closed: Able to stand 10 seconds with supervision (minor anterior/posterior sway) Standing Ubsupported with Feet Together: Needs help to attain position but able to stand for 30 seconds with feet together (needed help to bring feet together but then able to maintain balance for 1 minute) From Standing, Reach Forward with Outstretched Arm: Reaches forward but needs supervision From Standing Position, Pick up Object from Floor: Able to pick up shoe, needs supervision From Standing Position, Turn to Look Behind Over each Shoulder: Needs supervision when turning (stands with wide BOS) Turn 360 Degrees: Needs close supervision or verbal cueing Standing Unsupported, Alternately Place Feet on Step/Stool: Able to complete >2 steps/needs minimal assist (L LOB when initially picking up R foot to tap it) Standing Unsupported, One Foot in Front: Loses balance while stepping or standing Standing on One Leg: Unable to try or needs assist to prevent fall Total Score: 24 Static Sitting Balance Static Sitting - Balance Support: Feet supported Static Sitting - Level of Assistance: 5: Stand by assistance Dynamic Sitting Balance Dynamic Sitting - Balance Support: Feet supported Dynamic Sitting - Level of Assistance: 5: Stand by assistance Dynamic Sitting Balance - Compensations: posterior lean when donnint socks Static Standing Balance Static Standing - Balance Support: During functional activity Static Standing - Level of Assistance: Other (comment);4: Min assist (CGA) Dynamic Standing Balance Dynamic  Standing - Balance Support: During functional activity Dynamic Standing - Level of Assistance: 4: Min assist;3: Mod assist Dynamic Standing - Balance Activities:  (ADLs) Extremity Assessment      RLE Assessment RLE Assessment: Within Functional Limits Active Range of Motion (AROM) Comments: WFL/WNL General Strength Comments: assessed in sitting RLE Strength Right Hip Flexion: 4+/5 Right Knee Flexion: 5/5 Right Knee Extension: 5/5 Right Ankle Dorsiflexion: 5/5 Right Ankle Plantar Flexion: 5/5 LLE Assessment LLE Assessment: Exceptions to [facility] Active Range of Motion (  AROM) Comments: WFL/WNL General Strength Comments: assessed in sitting LLE Strength Left Hip Flexion: 4/5 Left Knee Flexion: 4+/5 Left Knee Extension: 4+/5 Left Ankle Dorsiflexion: 4+/5 Left Ankle Plantar Flexion: 4+/5  Care Tool Care Tool Bed Mobility Roll left and right activity   Roll left and right assist level: Supervision/Verbal cueing    Sit to lying activity   Sit to lying assist level: Supervision/Verbal cueing    Lying to sitting on side of bed activity   Lying to sitting on side of bed assist level: the ability to move from lying on the back to sitting on the side of the bed with no back support.: Supervision/Verbal cueing     Care Tool Transfers Sit to stand transfer   Sit to stand assist level: Contact Guard/Touching assist    Chair/bed transfer   Chair/bed transfer assist level: Minimal Assistance - Patient > 75%     Product manager transfer assist level: Minimal Assistance - Patient > 75%      Care Tool Locomotion Ambulation   Assist level: Minimal Assistance - Patient > 75% Assistive device: No Device Max distance: 110f  Walk 10 feet activity   Assist level: Minimal Assistance - Patient > 75% Assistive device: No Device   Walk 50 feet with 2 turns activity   Assist level: Minimal Assistance - Patient > 75% Assistive device: No Device  Walk 150 feet  activity Walk 150 feet activity did not occur: Safety/medical concerns      Walk 10 feet on uneven surfaces activity   Assist level: Minimal Assistance - Patient > 75% Assistive device: Other (comment) (railing)  Stairs   Assist level: Minimal Assistance - Patient > 75% Stairs assistive device: 2 hand rails Max number of stairs: 12  Walk up/down 1 step activity   Walk up/down 1 step (curb) assist level: Minimal Assistance - Patient > 75% Walk up/down 1 step or curb assistive device: 2 hand rails  Walk up/down 4 steps activity   Walk up/down 4 steps assist level: Minimal Assistance - Patient > 75% Walk up/down 4 steps assistive device: 2 hand rails  Walk up/down 12 steps activity   Walk up/down 12 steps assist level: Minimal Assistance - Patient > 75% Walk up/down 12 steps assistive device: 2 hand rails  Pick up small objects from floor   Pick up small object from the floor assist level: Minimal Assistance - Patient > 75%    Wheelchair   Type of Wheelchair: Manual (for transport)   Wheelchair assist level: Dependent - Patient 0%    Wheel 50 feet with 2 turns activity   Assist Level: Dependent - Patient 0%  Wheel 150 feet activity   Assist Level: Dependent - Patient 0%    Refer to Care Plan for Long Term Goals  SHORT TERM GOAL WEEK 1 PT Short Term Goal 1 (Week 1): Pt will perform supine<>sit mod-I PT Short Term Goal 2 (Week 1): Pt will perform sit<>stand transfers using LRAD with CGA PT Short Term Goal 3 (Week 1): Pt will perform bed<>chair transfers using LRAD with CGA PT Short Term Goal 4 (Week 1): Pt will ambulate at least 1073fusing LRAD with CGA PT Short Term Goal 5 (Week 1): Pt will demonstrate an increase by at least 8 points on the Berg Balance Scale  Recommendations for other services: Therapeutic Recreation  Outing/community reintegration  Skilled Therapeutic Intervention Pt received supine in bed with his wife,  [name], present and pt agreeable to therapy  session. Evaluation completed (see details above) with patient education regarding purpose of PT evaluation, PT POC and goals, therapy schedule, weekly team meetings, and other CIR information including safety plan and fall risk safety. Pt performed the below functional mobility tasks with the specified levels of skilled cuing and assistance. Vitals assessed throughout as described below.  Vitals sitting EOB: HR 74bpm, SpO2 95% Vitals after gait: HR 72-75bpm, SpO2 95-98%  During Berg: HR 74bpm, SpO2 97%  During gait training pt with 2x R anterior LOB with delayed balance recovery strategy requiring assistance to maintain upright. Patient participated in [employer] and demonstrates increased fall risk as noted by score of 24/56.  (<36= high risk for falls, close to 100%; 37-45 significant >80%; 46-51 moderate >50%; 52-55 lower >25%). At end of session, pt left supine in bed with needs in reach, bed alarm on, and his wife present.  Mobility Bed Mobility Bed Mobility: Supine to Sit;Sit to Supine Rolling Right: Supervision/verbal cueing Rolling Left: Supervision/Verbal cueing Supine to Sit: Supervision/Verbal cueing Sit to Supine: Supervision/Verbal cueing Transfers Transfers: Sit to Stand;Stand to Sit;Stand Pivot Transfers Sit to Stand: Contact Guard/Touching assist;Minimal Assistance - Patient > 75% Stand to Sit: Contact Guard/Touching assist;Minimal Assistance - Patient > 75% Stand Pivot Transfers: Contact Guard/Touching assist;Minimal Assistance - Patient > 75% Stand Pivot Transfer Details: Verbal cues for technique;Verbal cues for precautions/safety;Tactile cues for weight shifting;Verbal cues for safe use of DME/AE;Tactile cues for sequencing;Verbal cues for sequencing Transfer (Assistive device): None Locomotion  Gait Ambulation: Yes Gait Assistance: Minimal Assistance - Patient > 75% Gait Distance (Feet): 112 Feet Assistive device: None Gait Assistance Details: Verbal cues for  technique;Tactile cues for weight shifting;Tactile cues for posture;Verbal cues for gait pattern Gait Gait: Yes Gait Pattern: Impaired Gait Pattern: Step-through pattern;Wide base of support;Poor foot clearance - left;Poor foot clearance - right (excessive B LE external rotation, 2x R anterior LOB requiring heavier min assist for balance) Gait velocity: decreased Stairs / Additional Locomotion Stairs: Yes Stairs Assistance: Contact Guard/Touching assist;Minimal Assistance - Patient > 75% Stair Management Technique: Two rails;Step to pattern (leading with L LE on ascent and R LE on descent) Height of Stairs: 6 Ramp: Minimal Assistance - Patient >75% (using railing) Wheelchair Mobility Wheelchair Mobility: No   Discharge Criteria: Patient will be discharged from PT if patient refuses treatment 3 consecutive times without medical reason, if treatment goals not met, if there is a change in medical status, if patient makes no progress towards goals or if patient is discharged from hospital.  The above assessment, treatment plan, treatment alternatives and goals were discussed and mutually agreed upon: by patient and by family  Tawana Scale , PT, DPT, NCS, CSRS 11/16/2022, 12:13 PM

## 2022-11-16 NOTE — Plan of Care (Signed)
  Problem: RH Balance Goal: LTG Patient will maintain dynamic standing with ADLs (OT) Description: LTG:  Patient will maintain dynamic standing balance with assist during activities of daily living (OT)  Flowsheets (Taken 11/16/2022 1243) LTG: Pt will maintain dynamic standing balance during ADLs with: Supervision/Verbal cueing   Problem: RH Bathing Goal: LTG Patient will bathe all body parts with assist levels (OT) Description: LTG: Patient will bathe all body parts with assist levels (OT) Flowsheets (Taken 11/16/2022 1243) LTG: Pt will perform bathing with assistance level/cueing: Supervision/Verbal cueing   Problem: RH Dressing Goal: LTG Patient will perform lower body dressing w/assist (OT) Description: LTG: Patient will perform lower body dressing with assist, with/without cues in positioning using equipment (OT) Flowsheets (Taken 11/16/2022 1243) LTG: Pt will perform lower body dressing with assistance level of: Supervision/Verbal cueing   Problem: RH Toileting Goal: LTG Patient will perform toileting task (3/3 steps) with assistance level (OT) Description: LTG: Patient will perform toileting task (3/3 steps) with assistance level (OT)  Flowsheets (Taken 11/16/2022 1243) LTG: Pt will perform toileting task (3/3 steps) with assistance level: Supervision/Verbal cueing   Problem: RH Simple Meal Prep Goal: LTG Patient will perform simple meal prep w/assist (OT) Description: LTG: Patient will perform simple meal prep with assistance, with/without cues (OT). Flowsheets (Taken 11/16/2022 1243) LTG: Pt will perform simple meal prep with assistance level of: Supervision/Verbal cueing LTG: Pt will perform simple meal prep w/level of: Ambulate with device   Problem: RH Light Housekeeping Goal: LTG Patient will perform light housekeeping w/assist (OT) Description: LTG: Patient will perform light housekeeping with assistance, with/without cues (OT). Flowsheets (Taken 11/16/2022 1243) LTG: Pt  will perform light housekeeping with assistance level of: Supervision/Verbal cueing LTG: Pt will perform light housekeeping w/level of: Ambulate with device   Problem: RH Toilet Transfers Goal: LTG Patient will perform toilet transfers w/assist (OT) Description: LTG: Patient will perform toilet transfers with assist, with/without cues using equipment (OT) Flowsheets (Taken 11/16/2022 1243) LTG: Pt will perform toilet transfers with assistance level of: Supervision/Verbal cueing   Problem: RH Tub/Shower Transfers Goal: LTG Patient will perform tub/shower transfers w/assist (OT) Description: LTG: Patient will perform tub/shower transfers with assist, with/without cues using equipment (OT) Flowsheets (Taken 11/16/2022 1243) LTG: Pt will perform tub/shower stall transfers with assistance level of: Supervision/Verbal cueing LTG: Pt will perform tub/shower transfers from: Walk in shower   Problem: RH Attention Goal: LTG Patient will demonstrate this level of attention during functional activites (OT) Description: LTG:  Patient will demonstrate this level of attention during functional activites  (OT) Flowsheets (Taken 11/16/2022 1243) Patient will demonstrate this level of attention during functional activites: Selective Patient will demonstrate above attention level in the following environment: Home LTG: Patient will demonstrate this level of attention during functional activites (OT): Supervision   Problem: RH Awareness Goal: LTG: Patient will demonstrate awareness during functional activites type of (OT) Description: LTG: Patient will demonstrate awareness during functional activites type of (OT) Flowsheets (Taken 11/16/2022 1243) Patient will demonstrate awareness during functional activites type of: Anticipatory LTG: Patient will demonstrate awareness during functional activites type of (OT): Supervision

## 2022-11-16 NOTE — Evaluation (Signed)
Speech Language Pathology Assessment and Plan  Patient Details  Name: Damon Parrish MRN: [id number] Date of Birth: 1941-03-26  SLP Diagnosis: Cognitive Impairments  Rehab Potential: Good ELOS: 10-14 days   Today's Date: 11/16/2022 SLP Individual Time: 1000-1100 SLP Individual Time Calculation (min): 7 min  Hospital Problem: Principal Problem:   Acute right MCA stroke [facility])  Past Medical History:  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   GERD (gastroesophageal reflux disease)    H/O cerebral aneurysm repair 2016   "put stent in"   High cholesterol    History of blood transfusion ~ 1950   "while in hospital w/pneumonia" (01/09/2018)   History of kidney stones    Hypertension    Nonruptured cerebral aneurysm    Pneumonia ~ 1950   Red man syndrome    Seizures ([place]) 12/27/2015; 12/28/2015   Stroke ([place]) 12/2015   denies residual on 01/09/2018)   Past Surgical History:  Past Surgical History:  Procedure Laterality Date   CATARACT EXTRACTION W/ INTRAOCULAR LENS  IMPLANT, BILATERAL Bilateral    COLONOSCOPY     2013 per patient: done in [place], normal, next colonoscopy due in 10 years   FOOT FRACTURE SURGERY Left    "pins, etc. in there"   [place] N/A 05/04/2015   Procedure: Pipeline Embolization;  Surgeon: Consuella Lose, MD;  Location: [place] NEURO ORS;  Service: Radiology;  Laterality: N/A;   RADIOLOGY WITH ANESTHESIA N/A 05/31/2015   Procedure: Pipeline Embolization;  Surgeon: Consuella Lose, MD;  Location: [place];  Service: Radiology;  Laterality: N/A;    Assessment / Plan / Recommendation Clinical Impression Damon Parrish is an 82 year old male who presented to [facility] ER on 11/13/2022 onset of left-sided weakness. Code stroke was initiated. 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  He awoke on the morning of presentation in his normal state of health.  He went back to sleep and woke up approximately 4 hours later and noticed he was clumsy in his left hand and left leg was weak and shaky. MRI of the brain revealed small area of acute or early subacute ischemia within the right corona radiata. He has history of both strokes and seizures and was seen in April 2017 at [facility] with patchy acute ischemia of the right frontoparietal lobes in the MCA territory infarct.  He recovered well and was followed up with neurology in 2018. He developed seizures at this same timeframe and was put on Keppra and titrated off.  However in 2020 he was seen again for abnormal left lower extremity movements that were thought to be focal seizures and Keppra was resumed at 500 mg twice daily.  He is now on aspirin and Plavix and recommend duration for 3 weeks then Plavix alone.     Patient demonstrates mild cognitive impairments characterized by decreased attention, functional problem solving, recall of functional information and emergent awareness which is suspected to impact his safety with functional and familiar tasks. SLP administered the Cognistat and patient scored within the average range for calculations, the lower end of the average range for orientation, attention, and memory registration, and mild impairment range for memory, reasoning/executive function.  Pt reported he feels "a little" below baseline and described changed as "slower to think." Suspect pt is nearing cognitive baseline. Would benefit from further discussion with family to determine baseline functioning. Patient's overall auditory comprehension and verbal expression appeared [facility] for all tasks assessed and patient was 100% intelligible at the conversation level. Patient would benefit from  skilled SLP intervention to maximize cognitive functioning and overall functional independence prior to discharge.    Skilled Therapeutic Interventions          SLP administered the Cognistat, as well as other informal speech/language/cognitive evaluation measures. See report for full details.    SLP Assessment  Patient will need skilled [facility] [facility] during CIR admission    Recommendations  Patient destination: Home Follow up Recommendations: Other (comment) (TBD) Equipment Recommended: None recommended by SLP    SLP Frequency 1 to 3 out of 7 days   SLP Duration  SLP Intensity  SLP Treatment/Interventions 10-14 days  Minumum of 1-2 x/day, 30 to 90 minutes  Cognitive remediation/compensation;Patient/family education;Therapeutic Activities;Functional tasks    Pain Pain Assessment Pain Scale: 0-10 Pain Score: 0-No pain  Prior Functioning Cognitive/Linguistic Baseline:  (suspected baselin deficits as per acute care SLP) Type of Home: House  Lives With: Spouse Available Help at Discharge: Family;Available 24 hours/day Education: high school and some college 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[employer])  SLP Evaluation Cognition Overall Cognitive Status: No family/caregiver present to determine baseline cognitive functioning Arousal/Alertness: Awake/alert Orientation Level: Oriented X4 Year: 2024 Month: February Day of Week: Incorrect Attention: Sustained Sustained Attention: Appears intact Memory: Impaired Memory Impairment: Storage deficit;Retrieval deficit Awareness: Impaired Awareness Impairment: Emergent impairment Problem Solving: Impaired Problem Solving Impairment: Verbal complex Executive Function: Reasoning;Decision Making Reasoning: Impaired Reasoning Impairment: Verbal complex Decision Making: Impaired Decision Making Impairment: Verbal complex Safety/Judgment: Impaired  Comprehension Auditory Comprehension Overall  Auditory Comprehension: Appears within functional limits for tasks assessed Expression Expression Primary Mode of Expression: Verbal Verbal Expression Overall Verbal Expression: Appears within functional limits for tasks assessed Pragmatics: Impairment Impairments: Eye contact Written Expression Dominant Hand: Right (Writes with R hand, eats and brushes teeth with L hand) Oral Motor Oral Motor/Sensory Function Overall Oral Motor/Sensory Function: Within functional limits Motor Speech Overall Motor Speech: Appears within functional limits for tasks assessed Articulation: Within functional limitis Intelligibility: Intelligible  Care Tool Care Tool Cognition Ability to hear (with hearing aid or hearing appliances if normally used Ability to hear (with hearing aid or hearing appliances if normally used): 1. Minimal difficulty - difficulty in some environments (e.g. when person speaks softly or setting is noisy)   Expression of Ideas and Wants Expression of Ideas and Wants: 3. Some difficulty - exhibits some difficulty with expressing needs and ideas (e.g, some words or finishing thoughts) or speech is not clear   Understanding Verbal and Non-Verbal Content Understanding Verbal and Non-Verbal Content: 3. Usually understands - understands most conversations, but misses some part/intent of message. Requires cues at times to understand  Memory/Recall Ability Memory/Recall Ability : Current season;That he or she is in a hospital/hospital unit      Oral Care Assessment Oral Assessment  (WDL): Within Defined Limits Lips: Symmetrical Teeth: Missing (Comment) Tongue: Pink;Moist Mucous Membrane(s): Pink;Moist Saliva: Moist, saliva free flowing Level of Consciousness: Alert Is patient on any of following O2 devices?: None of the above Nutritional status: No high risk factors Oral Assessment Risk : Low Risk  Short Term Goals: Week 1: SLP Short Term Goal 1 (Week 1): Patient will respond to  problem solving scenarios with min A for appropriate reasoning and judgement SLP Short Term Goal 2 (Week 1): Patient will complete complex problem solving tasks with min A verbal cues SLP Short Term Goal 3 (Week 1): Patient will utilize beneficial memory compensations to recall novel information with min A verbal cues  Refer to Care Plan for Long Term Goals  Recommendations for other services: None   Discharge Criteria: Patient will be discharged from SLP if patient refuses treatment 3 consecutive times without medical reason, if treatment goals not met, if there is a change in medical status, if patient makes no progress towards goals or if patient is discharged from hospital.  The above assessment, treatment plan, treatment alternatives and goals were discussed and mutually agreed upon: by patient  Patty Sermons 11/16/2022, 3:42 PM

## 2022-11-16 NOTE — Plan of Care (Signed)
  Problem: RH Balance Goal: LTG Patient will maintain dynamic sitting balance (PT) Description: LTG:  Patient will maintain dynamic sitting balance with assistance during mobility activities (PT) Flowsheets (Taken 11/16/2022 1842) LTG: Pt will maintain dynamic sitting balance during mobility activities with:: Independent with assistive device  Goal: LTG Patient will maintain dynamic standing balance (PT) Description: LTG:  Patient will maintain dynamic standing balance with assistance during mobility activities (PT) Flowsheets (Taken 11/16/2022 1842) LTG: Pt will maintain dynamic standing balance during mobility activities with:: Supervision/Verbal cueing   Problem: Sit to Stand Goal: LTG:  Patient will perform sit to stand with assistance level (PT) Description: LTG:  Patient will perform sit to stand with assistance level (PT) Flowsheets (Taken 11/16/2022 1842) LTG: PT will perform sit to stand in preparation for functional mobility with assistance level: Supervision/Verbal cueing   Problem: RH Bed Mobility Goal: LTG Patient will perform bed mobility with assist (PT) Description: LTG: Patient will perform bed mobility with assistance, with/without cues (PT). Flowsheets (Taken 11/16/2022 1842) LTG: Pt will perform bed mobility with assistance level of: Independent with assistive device    Problem: RH Bed to Chair Transfers Goal: LTG Patient will perform bed/chair transfers w/assist (PT) Description: LTG: Patient will perform bed to chair transfers with assistance (PT). Flowsheets (Taken 11/16/2022 1842) LTG: Pt will perform Bed to Chair Transfers with assistance level: Supervision/Verbal cueing   Problem: RH Car Transfers Goal: LTG Patient will perform car transfers with assist (PT) Description: LTG: Patient will perform car transfers with assistance (PT). Flowsheets (Taken 11/16/2022 1842) LTG: Pt will perform car transfers with assist:: Supervision/Verbal cueing   Problem: RH  Ambulation Goal: LTG Patient will ambulate in controlled environment (PT) Description: LTG: Patient will ambulate in a controlled environment, # of feet with assistance (PT). Flowsheets (Taken 11/16/2022 1842) LTG: Pt will ambulate in controlled environ  assist needed:: Supervision/Verbal cueing LTG: Ambulation distance in controlled environment: 140f using LRAD Goal: LTG Patient will ambulate in home environment (PT) Description: LTG: Patient will ambulate in home environment, # of feet with assistance (PT). Flowsheets (Taken 11/16/2022 1842) LTG: Pt will ambulate in home environ  assist needed:: Supervision/Verbal cueing LTG: Ambulation distance in home environment: 564fusing LRAD   Problem: RH Stairs Goal: LTG Patient will ambulate up and down stairs w/assist (PT) Description: LTG: Patient will ambulate up and down # of stairs with assistance (PT) Flowsheets (Taken 11/16/2022 1842) LTG: Pt will ambulate up/down stairs assist needed:: Supervision/Verbal cueing LTG: Pt will  ambulate up and down number of stairs: 1 step to simulate home entry

## 2022-11-17 NOTE — Progress Notes (Signed)
PROGRESS NOTE   Subjective/Complaints:  Pt doing well today, slept well last night. Not having any pain. Had BM yesterday, not too soft. Urinating well with urinal. No other complaints or concerns.   ROS: +poor sleep-improving, +constipation-improved. Denies fevers, chills, CP, SOB, abd pain, N/V/D, new/worsening paresthesias/weakness, or any other complaints at this time.    Objective:   EP PPM/ICD IMPLANT  Result Date: 11/15/2022 SURGEON:  Melida Quitter, MD   PREPROCEDURE DIAGNOSIS:  Cryptogenic stroke   POSTPROCEDURE DIAGNOSIS: Cryptogenic stroke    PROCEDURES:  1. Implantable loop recorder implantation   INTRODUCTION:  Damon Parrish presents with a history of cryptogenic stroke The costs of loop recorder monitoring have been discussed with the patient.   DESCRIPTION OF PROCEDURE:  Informed written consent was obtained.  A timeout was performed. The patient required no sedation for the procedure today. The patients left chest was prepped and draped in the usual sterile fashion. The skin overlying the left parasternal region was infiltrated with lidocaine for local analgesia.  A 0.5-cm incision was made over the left parasternal region over the 3rd intercostal space.  A subcutaneous ILR pocket was fashioned using a combination of sharp and blunt dissection.  A Med[employer]onic Reveal LINQ implantable loop recorder [id number]) was then placed into the pocket  R waves were very prominent and measured >0.87m.  Steri- Strips and a sterile dressing were then applied.  There were no early apparent complications.   CONCLUSIONS:  1. Successful implantation of a implantable loop recorder for a history of cryptogenic stroke  2. No early apparent complications. AMelida Quitter MD Cardiac Electrophysiology   Recent Labs    11/16/22 0606  WBC 8.2  HGB 14.0  HCT 40.3  PLT 211   Recent Labs    11/16/22 0606  NA 133*  K 4.0  CL 101  CO2 23   GLUCOSE 110*  BUN 15  CREATININE 1.07  CALCIUM 8.9    Intake/Output Summary (Last 24 hours) at 11/17/2022 1100 Last data filed at 11/17/2022 1015 Gross per 24 hour  Intake 460 ml  Output 1150 ml  Net -690 ml        Physical Exam: Vital Signs Blood pressure 128/74, pulse 74, temperature 97.7 F (36.5 C), temperature source Oral, resp. rate 18, height '5\' 9"'$  (1.753 m), weight 81.8 kg, SpO2 100 %.  Constitutional: No apparent distress. Appropriate appearance for age. Laying in bed HENT: No JVD. Neck Supple. Trachea midline. Atraumatic, normocephalic. Eyes: PERRLA. EOMI. Visual fields grossly intact.  Cardiovascular: RRR, no murmurs/rub/gallops. No Edema. Peripheral pulses 2+  Respiratory: CTAB. Mildly decreased breath sounds throughout-stable. No rales, rhonchi, or wheezing. On RA.  Abdomen: + bowel sounds, normoactive. No distention or tenderness.  Skin: C/D/I. No apparent lesions.  PRIOR EXAM: MSK:      No apparent deformity.      Strength:                RUE: 5/5 SA, 5/5 EF, 5/5 EE, 5/5 WE, 5/5 FF, 5/5 FA                 LUE: 5-/5 SA, 5-/5 EF, 5-/5 EE, 5-/5 WE, 5-/5  FF, 5-/5 FA                 RLE: 5/5 HF, 5/5 KE, 5/5 DF, 5/5 EHL, 5/5 PF                 LLE:  5/5 HF, 5/5 KE, 5/5 DF, 5/5 EHL, 5/5 PF    Neurologic exam:  Cognition: AAO to person, place, time and event.  Language: Fluent, No substitutions or neoglisms. No dysarthria. Names 3/3 objects correctly.  Memory: Recalls 1/3 objects at 5 minutes. No apparent deficits  Insight: Good insight into current condition.  Mood: Pleasant affect, appropriate mood.  Sensation: To light touch intact in BL UEs and LEs  Reflexes: 2+ in BL UE and LEs. Negative Hoffman's and babinski signs bilaterally.  CN: 2-12 grossly intact.  Coordination: No apparent tremors. + LUE ataxia on FTN, none on HTS bilaterally.  Spasticity: MAS 0 in all extremities.   Assessment/Plan: 1. Functional deficits which require 3+ hours per day of  interdisciplinary therapy in a comprehensive inpatient rehab setting. Physiatrist is providing close team supervision and 24 hour management of active medical problems listed below. Physiatrist and rehab team continue to assess barriers to discharge/monitor patient progress toward functional and medical goals  Care Tool:  Bathing    Body parts bathed by patient: Right arm, Left arm, Chest, Abdomen, Front perineal area, Buttocks, Right upper leg, Left upper leg, Right lower leg, Left lower leg, Face         Bathing assist Assist Level: Contact Guard/Touching assist (standing with RW/ sitting in wc at sink)     Upper Body Dressing/Undressing Upper body dressing   What is the patient wearing?: Pull over shirt    Upper body assist Assist Level: Supervision/Verbal cueing    Lower Body Dressing/Undressing Lower body dressing      What is the patient wearing?: Underwear/pull up, Pants     Lower body assist Assist for lower body dressing: Minimal Assistance - Patient > 75%     Toileting Toileting    Toileting assist Assist for toileting: Minimal Assistance - Patient > 75%     Transfers Chair/bed transfer  Transfers assist     Chair/bed transfer assist level: Minimal Assistance - Patient > 75%     Locomotion Ambulation   Ambulation assist      Assist level: Minimal Assistance - Patient > 75% Assistive device: No Device Max distance: 160f   Walk 10 feet activity   Assist     Assist level: Minimal Assistance - Patient > 75% Assistive device: No Device   Walk 50 feet activity   Assist    Assist level: Minimal Assistance - Patient > 75% Assistive device: No Device    Walk 150 feet activity   Assist Walk 150 feet activity did not occur: Safety/medical concerns         Walk 10 feet on uneven surface  activity   Assist     Assist level: Minimal Assistance - Patient > 75% Assistive device: Other (comment) (railing)    Wheelchair     Assist Is the patient using a wheelchair?: Yes Type of Wheelchair: Manual (for transport)    Wheelchair assist level: Dependent - Patient 0%      Wheelchair 50 feet with 2 turns activity    Assist        Assist Level: Dependent - Patient 0%   Wheelchair 150 feet activity     Assist      Assist  Level: Dependent - Patient 0%   Blood pressure 128/74, pulse 74, temperature 97.7 F (36.5 C), temperature source Oral, resp. rate 18, height '5\' 9"'$  (1.753 m), weight 81.8 kg, SpO2 100 %.  Medical Problem List and Plan: 1. Functional deficits secondary to R ischemic CVA with L hemiparesis             -patient may shower             -ELOS/Goals: 10-12 days, Supervision OT/PT/SLP  -Cont CIR   2.  Antithrombotics: -DVT/anticoagulation:  Pharmaceutical: Lovenox '40mg'$  QD  -11/16/22 Dopplers negative on 11/14/22 -antiplatelet therapy: aspirin '81mg'$  QD and Plavix '75mg'$  QD for three weeks followed by Plavix alone (stop aspirin 3/14)   3. Pain Management: Tylenol as needed, Robaxin '500mg'$  q6h PRN   4. Mood/Behavior/Sleep: LCSW to evaluate and provide emotional support             -antipsychotic agents: n/a  -Trazodone PRN   5. Neuropsych/cognition: This patient is capable of making decisions on his own behalf.   6. Skin/Wound Care: Routine skin care checks   7. Fluids/Electrolytes/Nutrition: Routine Is and Os and follow-up chemistries next 11/18/22   8: Hypertension: monitor TID and prn (home Norvasc '10mg'$  QD held)  -11/17/22 BP stable, monitor for now   Vitals:   11/15/22 2055 11/16/22 0444 11/16/22 1030 11/16/22 1428  BP: (!) 146/76 (!) 142/87 121/73 (!) 144/78   11/16/22 1922 11/17/22 0445  BP: 139/80 128/74      9: Hyperlipidemia: continue statin: now on high-intensity>>Lipitor 80 mg daily   10: BPH: continue Proscar 5 mg daily (home Flomax 0.'4mg'$  QD not restarted)   11: Prior history of stroke with seizure activity: follows with Dr. Delice Lesch              -Keppra 500 mg BID -11/16/22 EEG overnight 11/15/22 suggestive of cortical dysfunction arising from right hemisphere likely secondary to underlying structural abnormality. No seizures or epileptiform discharges were seen throughout the recording.   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 Cont regimen. Monitor for oversoftening   17: Tobacco use: cessation counseling; declines nicotine patch    LOS: 2 days A F[place] TO [place]2/25/2024, 11:00 AM

## 2022-11-18 ENCOUNTER — Telehealth: Payer: Self-pay

## 2022-11-18 ENCOUNTER — Telehealth: Payer: Self-pay | Admitting: Internal Medicine

## 2022-11-18 ENCOUNTER — Encounter (HOSPITAL_COMMUNITY): Payer: Self-pay | Admitting: Cardiovascular Disease

## 2022-11-18 LAB — CBC
HCT: 42.9 % (ref 39.0–52.0)
Hemoglobin: 14.7 g/dL (ref 13.0–17.0)
MCH: 33.6 pg (ref 26.0–34.0)
MCHC: 34.3 g/dL (ref 30.0–36.0)
MCV: 97.9 fL (ref 80.0–100.0)
Platelets: 211 10*3/uL (ref 150–400)
RBC: 4.38 MIL/uL (ref 4.22–5.81)
RDW: 12.1 % (ref 11.5–15.5)
WBC: 7.2 10*3/uL (ref 4.0–10.5)
nRBC: 0 % (ref 0.0–0.2)

## 2022-11-18 LAB — BASIC METABOLIC PANEL
Anion gap: 7 (ref 5–15)
BUN: 15 mg/dL (ref 8–23)
CO2: 22 mmol/L (ref 22–32)
Calcium: 8.7 mg/dL — ABNORMAL LOW (ref 8.9–10.3)
Chloride: 101 mmol/L (ref 98–111)
Creatinine, Ser: 0.95 mg/dL (ref 0.61–1.24)
GFR, Estimated: >60 mL/min (ref >60)
Glucose, Bld: 101 mg/dL — ABNORMAL HIGH (ref 70–99)
Potassium: 3.7 mmol/L (ref 3.5–5.1)
Sodium: 130 mmol/L — ABNORMAL LOW (ref 135–145)

## 2022-11-18 NOTE — Progress Notes (Signed)
Inpatient Rehabilitation  Patient information reviewed and entered into eRehab system by Suresh Audi M. Yalda Herd, M.A., CCC/SLP, PPS Coordinator.  Information including medical coding, functional ability and quality indicators will be reviewed and updated through discharge.    

## 2022-11-18 NOTE — Progress Notes (Signed)
PROGRESS NOTE   Subjective/Complaints:  Wife at bedside asking about potential d/c date and medications  ROS: Denies fevers, chills, CP, SOB, abd pain, N/V/D,  Objective:   No results found. Recent Labs    11/16/22 0606 11/18/22 0658  WBC 8.2 7.2  HGB 14.0 14.7  HCT 40.3 42.9  PLT 211 211    Recent Labs    11/16/22 0606 11/18/22 0658  NA 133* 130*  K 4.0 3.7  CL 101 101  CO2 23 22  GLUCOSE 110* 101*  BUN 15 15  CREATININE 1.07 0.95  CALCIUM 8.9 8.7*     Intake/Output Summary (Last 24 hours) at 11/18/2022 1002 Last data filed at 11/18/2022 0811 Gross per 24 hour  Intake 594 ml  Output 700 ml  Net -106 ml         Physical Exam: Vital Signs Blood pressure (!) 140/78, pulse 63, temperature 97.6 F (36.4 C), temperature source Oral, resp. rate 16, height '5\' 9"'$  (1.753 m), weight 81.8 kg, SpO2 93 %.   General: No acute distress Mood and affect are appropriate Heart: Regular rate and rhythm no rubs murmurs or extra sounds Lungs: Clear to auscultation, breathing unlabored, no rales or wheezes Abdomen: Positive bowel sounds, soft nontender to palpation, nondistended Extremities: No clubbing, cyanosis, or edema Skin: No evidence of breakdown, no evidence of rash Neurologic: Cranial nerves II through XII intact, motor strength is 5/5 in bilateral deltoid, bicep, tricep, grip, hip flexor, knee extensors, ankle dorsiflexor and plantar flexor    PRIOR EXAM: MSK:      No apparent deformity.      Strength:                5/5 RUE and RLE, 4+ LUE and 5/5 LLE   Neurologic exam:  Cognition: AAO to person, place, time and event.  Language: Fluent, No substitutions or neoglisms. No dysarthria. Insight: Good insight into current condition.  Mood: Pleasant affect, appropriate mood.  Sensation: To light touch intact in BL UEs and LEs   CN: 2-12 grossly intact.  Coordination: No ataxia Spasticity: MAS 0 in all  extremities.   Assessment/Plan: 1. Functional deficits which require 3+ hours per day of interdisciplinary therapy in a comprehensive inpatient rehab setting. Physiatrist is providing close team supervision and 24 hour management of active medical problems listed below. Physiatrist and rehab team continue to assess barriers to discharge/monitor patient progress toward functional and medical goals  Care Tool:  Bathing    Body parts bathed by patient: Right arm, Left arm, Chest, Abdomen, Front perineal area, Buttocks, Right upper leg, Left upper leg, Right lower leg, Left lower leg, Face         Bathing assist Assist Level: Contact Guard/Touching assist (standing with RW/ sitting in wc at sink)     Upper Body Dressing/Undressing Upper body dressing   What is the patient wearing?: Pull over shirt    Upper body assist Assist Level: Supervision/Verbal cueing    Lower Body Dressing/Undressing Lower body dressing      What is the patient wearing?: Underwear/pull up, Pants     Lower body assist Assist for lower body dressing: Minimal Assistance - Patient >  75%     Toileting Toileting    Toileting assist Assist for toileting: Minimal Assistance - Patient > 75%     Transfers Chair/bed transfer  Transfers assist     Chair/bed transfer assist level: Minimal Assistance - Patient > 75%     Locomotion Ambulation   Ambulation assist      Assist level: Minimal Assistance - Patient > 75% Assistive device: No Device Max distance: 172f   Walk 10 feet activity   Assist     Assist level: Minimal Assistance - Patient > 75% Assistive device: No Device   Walk 50 feet activity   Assist    Assist level: Minimal Assistance - Patient > 75% Assistive device: No Device    Walk 150 feet activity   Assist Walk 150 feet activity did not occur: Safety/medical concerns         Walk 10 feet on uneven surface  activity   Assist     Assist level: Minimal  Assistance - Patient > 75% Assistive device: Other (comment) (railing)   Wheelchair     Assist Is the patient using a wheelchair?: Yes Type of Wheelchair: Manual (for transport)    Wheelchair assist level: Dependent - Patient 0%      Wheelchair 50 feet with 2 turns activity    Assist        Assist Level: Dependent - Patient 0%   Wheelchair 150 feet activity     Assist      Assist Level: Dependent - Patient 0%   Blood pressure (!) 140/78, pulse 63, temperature 97.6 F (36.4 C), temperature source Oral, resp. rate 16, height '5\' 9"'$  (1.753 m), weight 81.8 kg, SpO2 93 %.  Medical Problem List and Plan: 1. 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  Antithrombotics: -DVT/anticoagulation:  Pharmaceutical: Lovenox '40mg'$  QD although pt is amb well at this point would cont lovenox due to lung nodule which may cause a hypercoagulable state   -11/16/22 Dopplers negative on 11/14/22 -antiplatelet therapy: aspirin '81mg'$  QD and Plavix '75mg'$  QD for three weeks followed by Plavix alone (stop aspirin 3/14)   3. Pain Management: Tylenol as needed, Robaxin '500mg'$  q6h PRN   4. Mood/Behavior/Sleep: LCSW to evaluate and provide emotional support             -antipsychotic agents: n/a  -Trazodone PRN   5. Neuropsych/cognition: This patient is capable of making decisions on his own behalf.   6. Skin/Wound Care: Routine skin care checks   7. Fluids/Electrolytes/Nutrition: Routine Is and Os and follow-up chemistries next 11/18/22   8: Hypertension: monitor TID and prn (home Norvasc '10mg'$  QD held)  -11/17/22 BP stable, monitor for now   Vitals:   11/15/22 2055 11/16/22 0444 11/16/22 1030 11/16/22 1428  BP: (!) 146/76 (!) 142/87 121/73 (!) 144/78   11/16/22 1922 11/17/22 0445 11/17/22 1435 11/17/22 1936  BP: 139/80 128/74 (!) 157/73 (!)  149/94   11/18/22 0442  BP: (!) 140/78      9: Hyperlipidemia: continue statin: now on high-intensity>>Lipitor 80 mg daily   10: BPH: continue Proscar 5 mg daily (home Flomax 0.'4mg'$  QD not restarted)   11: Prior history of stroke with seizure activity: follows with Dr. ADelice Lesch            -  Keppra 500 mg BID -11/16/22 EEG overnight 11/15/22 suggestive of cortical dysfunction arising from right hemisphere likely secondary to underlying structural abnormality. No seizures or epileptiform discharges were seen throughout the recording.   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 Cont regimen. Monitor for oversoftening  2/26 having BMs daily , continent 17: Tobacco use: cessation counseling; declines nicotine patch    LOS: 3 days A FACE TO FACE EVALUATION WAS PERFORMED  ACharlett Blake2/26/2024, 10:02 AM

## 2022-11-18 NOTE — Progress Notes (Signed)
Speech Language Pathology Daily Session Note  Patient Details  Name: Damon Parrish MRN: [id number] Date of Birth: Sep 02, 1941  Today's Date: 11/18/2022 SLP Individual Time: 0830-0930 SLP Individual Time Calculation (min): 60 min  Short Term Goals: Week 1: SLP Short Term Goal 1 (Week 1): Patient will respond to problem solving scenarios with min A for appropriate reasoning and judgement SLP Short Term Goal 2 (Week 1): Patient will complete complex problem solving tasks with min A verbal cues SLP Short Term Goal 3 (Week 1): Patient will utilize beneficial memory compensations to recall novel information with min A verbal cues  Skilled Therapeutic Interventions: Skilled ST treatment focused on cognitive goals. Pt was greeted semi-reclined in bed on arrival and motivated to participate in therapy with eagerness to go home. SLP facilitated a financial management task through check writing with 100% accuracy at mod I level. Pt made one error but was able to identify and self correct with independence. SLP facilitated "solving daily math problems" via the ALFA with 9/10 accurate at independent level, progressing to 10/10 with sup A verbal cue x1 to repair error. SLP facilitated identification of unsafe scenarios within home environment and generating appropriate solutions with sup A verbal cues for recall of scenario components at the paragraph level, ID of safety, and sup A for generating appropriate solutions. Pt demonstrated awareness of current deficits with sup A verbal cues for anticipatory problem solving upon discharge to home environment. Pt was left in bed with alarm activated and immediate needs within reach at end of session. Continue per current plan of care.      Pain Pain Assessment Pain Scale: 0-10 Pain Score: 0-No pain  Therapy/Group: Individual Therapy  Patty Sermons 11/18/2022, 8:43 AM

## 2022-11-18 NOTE — Progress Notes (Signed)
Patient ID: Damon Parrish, male   DOB: [birth year], 82 y.o.   MRN: [id number] Met with the patient to review current situation; post stroke with weakness/facial droop. Denies pain. Reviewed secondary risks, including HTN, HLD (Lipitor), prediabetes, medications including DAPT x 3 wks then Plavix solo per MD and smoking cessation/rationale with health risks of smoking. Patient is anxious to go home. Continue to follow along to address educational needs to facilitate preparation for discharge. Margarito Liner

## 2022-11-18 NOTE — Progress Notes (Signed)
Physical Therapy Session Note  Patient Details  Name: Damon Parrish MRN: [id number] Date of Birth: 02-Nov-1940  Today's Date: 11/18/2022 PT Individual Time: 1015-1100 PT Individual Time Calculation (min): 45 min   Short Term Goals: Week 1:  PT Short Term Goal 1 (Week 1): Pt will perform supine<>sit mod-I PT Short Term Goal 2 (Week 1): Pt will perform sit<>stand transfers using LRAD with CGA PT Short Term Goal 3 (Week 1): Pt will perform bed<>chair transfers using LRAD with CGA PT Short Term Goal 4 (Week 1): Pt will ambulate at least 117f using LRAD with CGA PT Short Term Goal 5 (Week 1): Pt will demonstrate an increase by at least 8 points on the Berg Balance Scale  Skilled Therapeutic Interventions/Progress Updates:      Therapy Documentation Precautions:  Precautions Precautions: Fall, Other (comment) Precaution Comments: L hemi, mild impulsivity, monitor HR, hx of seizures, loop recorder Restrictions Weight Bearing Restrictions: No Other Position/Activity Restrictions: resting R tremor at baseline  Pt received standing at sink no AD with OT performing self-care tasks, PT present for handoff. Pt completed daily routine in standing CGA and MD rounded with patient and family. Pt hopeful he can discharge by Friday.   Pt CGA/min A for gait ~200 ft to main gym with no AD. Pt requires intermittent min A for obstacle negotiation 2/2 left inattention and for poor foot clearance of left foot 2/2 fatigue.   Pt negotiated 12 steps with no handrails with CGA for safety without rest breaks. Pt reports he has one step with no handrails to enter residence and PT educated wife on hand held assist if pt needs for discharge for safety.   Pt transitioned to gait training with visual scanning to the left to retrieve objects from the wall and the ground and requires CGA with min/mod cues for visual scanning.   Pt completed timed up and go assessment and required 13.38 seconds to complete.   Pt  ambulated CGA/min A with one episode of imbalance 2/2 to poor foot clearance requiring PT assist.   Pt left semi-reclined in bed with spouse present and all needs in reach, alarm on.     Therapy/Group: Individual Therapy  SVerl DickerSVerl DickerPT, DPT  11/18/2022, 12:22 PM

## 2022-11-18 NOTE — Progress Notes (Signed)
Occupational Therapy Session Note  Patient Details  Name: Damon Parrish MRN: [id number] Date of Birth: 03-18-1941  Today's Date: 11/18/2022 OT Individual Time: 1003-1015 OT Individual Time Calculation (min): 12 min  OT Individual Time: 1116-1200 OT Individual Time Calculation (min): 44 min   Short Term Goals: Week 1:  OT Short Term Goal 1 (Week 1): Pt will complete LB dressing with CGA consistently using LRAD OT Short Term Goal 2 (Week 1): Pt will demonstrate increased safety awareness during BADLs/Functional tasks by recalling 3/3 learned strategies OT Short Term Goal 3 (Week 1): Pt will maintian attention on functional task/BADLs in noisey environment ~5 minutes with min cueing  Skilled Therapeutic Interventions/Progress Updates:     10:03-10:15 Pt received reclined in bed presenting to be in good spirits and receptive to skilled OT session. Pt reporting 0/10 pain this session. Pt able to transition to EOB using bed features with supervision. Pt reporting he would like to go home ASAP with OT providing education on functional and cognitive deficits 2/2 CVA and reason for therapy with Pt receptive. Pt requesting to complete grooming/hygiene tasks at beginning of session. Pt donned shoes EOB for dynamic sitting balance challenge with Pt able to complete with supervision +time by crossing legs into figure-4 position. VB cueing required to breath during tasks d/t Pt giving maximal effort. Pt sit>stand and ambulation to sink CGA without AD. Pt able to maintain balance at sink to brush teeth with CGA with mild L-sided neglect present. Pt handed off to PT at end of session.   [id number]: Pt received supine in bed with wife, [name], present in room. Pt presenting to be in good spirits and receptive to skilled OT session. Pt wife reporting she provides assistance with pt taking medications and finances at home. Pt sit>stand CGA. Pt ambulated to ADL apartment with min A d/t decreased safety awareness 2/2 L  sided neglect with Pt requiring mod VB/tactile cueing to avoid running into obstacles on L side.  Pt educated on Risk analyst with education provided on L sided neglect and fall prevention with Pt receptive. Pt completed kitchen mobility tasks in standing without AD. Pt able to load/unload dishwasher reaching towards ground and over head maintaining dynamic standing balance with CGA. Pt then able to carry heavy basket ~15 feet with groceries and place items into overhead cabinet CGA with min cues for safety. Single posterior LOB noted when completing functional reaching, however Pt able to correct with tactile cue. Pt completed visual attention tasks of gathering fruits on L side of counter while carrying basket to increase Pt awareness to L side with light min A for safety. Pt able to gather 15/15 items in basket without Vb cueing required, however VB cues required to avoid obstacles on L side.   Pt transported to therapy gym in wc for time management and to provide Pt rest break. Pt completed standing visual scanning/functional cognition tasks on BITTs to increase Pt awareness to L side.  Pt able to sequence numbers 1-20 with min cueing to turn head to locate numbers.   Pt propelled wc back to room for UB strengthening and endurance training with min cues required to avoid obstacles. Pt stand step back to bed. Sit>supine supervision. Pt left resting in bed with call bell in reach, bed alarm on, and all needs met with wife present in room.    Therapy Documentation Precautions:  Precautions Precautions: Fall, Other (comment) Precaution Comments: L hemi, mild impulsivity, monitor HR, hx of seizures, loop recorder  Restrictions Weight Bearing Restrictions: No Other Position/Activity Restrictions: resting R tremor at baseline General:   Vital Signs:  Pain: Pain Assessment Pain Scale: 0-10 Pain Score: 0-No pain ADL: ADL Eating: Supervision/safety Where Assessed-Eating: Bed level Grooming:  Contact guard Where Assessed-Grooming: Standing at sink (RW) Upper Body Bathing: Supervision/safety Where Assessed-Upper Body Bathing: Standing at sink Lower Body Bathing: Contact guard Where Assessed-Lower Body Bathing: Sitting at sink, Wheelchair, Standing at sink Upper Body Dressing: Supervision/safety, Minimal cueing Where Assessed-Upper Body Dressing: Edge of bed Lower Body Dressing: Minimal assistance, Contact guard Where Assessed-Lower Body Dressing: Edge of bed Toileting: Minimal assistance Where Assessed-Toileting: Glass blower/designer: Contact guard, Minimal assistance Toilet Transfer Method: Ambulating Dietitian) Science writer: Energy manager: Not assessed Social research officer, government: Minimal assistance (RW) Social research officer, government Method: Heritage manager: Civil engineer, contracting with back   Therapy/Group: Individual Therapy  Janey Genta 11/18/2022, 12:10 PM

## 2022-11-18 NOTE — Progress Notes (Signed)
Occupational Therapy Session Note  Patient Details  Name: Damon Parrish MRN: [id number] Date of Birth: 08/05/1941  Today's Date: 11/18/2022 OT Individual Time: 1400-1445 OT Individual Time Calculation (min): 45 min    Short Term Goals: Week 1:  OT Short Term Goal 1 (Week 1): Pt will complete LB dressing with CGA consistently using LRAD OT Short Term Goal 2 (Week 1): Pt will demonstrate increased safety awareness during BADLs/Functional tasks by recalling 3/3 learned strategies OT Short Term Goal 3 (Week 1): Pt will maintian attention on functional task/BADLs in noisey environment ~5 minutes with min cueing  Skilled Therapeutic Interventions/Progress Updates:    Pt received in bed with wife and visitors present. Pt not expecting therapy at this hour as it was rescheduled and initially reluctant to participate.  Pt agreed with strong coaxing from his wife.  Pt stated "well I feel fine and ready to go home now".  Told pt we would look at his balance and endurance to see if there are areas of concern.  His wife stated she was only nervous about the shower transfer. Discussed grab bar placements, non slip flooring, drying off in shower.  Also suggested if they get home health, to allow [place] to practice shower transfer first.   Pt then stood from EOB and ambulated with close S to dayroom gym. Challenged pt with sit to stands, standing lateral weight shifts reaching with L hand out of BOS reaching left to right, seated L arm exercises with 2 lb weight for sh flex, sh abd and elbow flexion. He worked on seated dynamic reaching L and R out of base of support.   In standing worked on wall slides for left shoulder, reaching through full ROM.  No L side inattention noted.  He ambulated back to room making an additional loop around nurses station with close S.   Pt in room with family and all needs met.   Therapy Documentation Precautions:  Precautions Precautions: Fall, Other (comment) Precaution Comments:  L hemi, mild impulsivity, monitor HR, hx of seizures, loop recorder Restrictions Weight Bearing Restrictions: No Other Position/Activity Restrictions: resting R tremor at baseline  Pain: Pain Assessment Pain Scale: 0-10 Pain Score: 0-No pain     Therapy/Group: Individual Therapy  [place] 11/18/2022, 11:49 AM

## 2022-11-18 NOTE — Telephone Encounter (Signed)
Scheduled appt per 2/23 referral. Pt's wife is aware of appt date and time. Pt's wife is aware to arrive 15 mins prior to appt time and to bring and updated insurance card. Pt's wife is aware of appt location.

## 2022-11-18 NOTE — Telephone Encounter (Signed)
-----   Message from [place], [place] sent at 11/15/2022  6:31 PM EST ----- Loop today  MDT  AM  Cryptogenic stroke

## 2022-11-18 NOTE — IPOC Note (Signed)
Overall Plan of Care [facility]) Patient Details Name: Damon Parrish MRN: [id number] DOB: 1941/01/07  Admitting Diagnosis: Acute right MCA stroke [facility])  Hospital Problems: Principal Problem:   Acute right MCA stroke [facility])     Functional Problem List: Nursing Bladder, Endurance, Medication Management, Safety  PT Balance, Perception, Behavior, Safety, Edema, Endurance, Skin Integrity, Motor, Nutrition, Pain  OT Balance, Behavior, Perception, Safety, Cognition, Skin Integrity, Endurance, Vision, Motor  SLP Cognition  TR         Basic ADL's: OT Grooming, Bathing, Toileting, Dressing     Advanced  ADL's: OT Light Housekeeping, Simple Meal Preparation     Transfers: PT Bed Mobility, Bed to Chair, Car, Furniture, Editor, commissioning, Agricultural engineer: PT Ambulation, Stairs     Additional Impairments: OT    SLP Social Cognition   Memory, Awareness, Problem Solving  TR      Anticipated Outcomes Item Anticipated Outcome  Self Feeding Independent  Swallowing  N/A   Basic self-care  Supervision  Toileting  Supervision   Bathroom Transfers Supervision  Bowel/Bladder  manage bladder w mod I  Transfers  supervision using LRAD  Locomotion  supervision using LRAD  Communication  N/A  Cognition  Sup A  Pain  n/a  Safety/Judgment  manage w cues   Therapy Plan: PT Intensity: Minimum of 1-2 x/day ,45 to 90 minutes PT Frequency: 5 out of 7 days PT Duration Estimated Length of Stay: ~2 weeks OT Intensity: Minimum of 1-2 x/day, 45 to 90 minutes OT Frequency: 5 out of 7 days OT Duration/Estimated Length of Stay: 10-14 days SLP Intensity: Minumum of 1-2 x/day, 30 to 90 minutes SLP Frequency: 1 to 3 out of 7 days SLP Duration/Estimated Length of Stay: 10-14 days   Team Interventions: Nursing Interventions Bladder Management, Medication Management, Discharge Planning, Patient/Family Education, Disease Management/Prevention  PT interventions Ambulation/gait training,  Community reintegration, DME/adaptive equipment instruction, Neuromuscular re-education, Psychosocial support, Stair training, UE/LE Strength taining/ROM, Training and development officer, Discharge planning, Functional electrical stimulation, Pain management, Skin care/wound management, Therapeutic Activities, UE/LE Coordination activities, Cognitive remediation/compensation, Disease management/prevention, Functional mobility training, Patient/family education, Splinting/orthotics, Therapeutic Exercise, Visual/perceptual remediation/compensation  OT Interventions Balance/vestibular training, Discharge planning, Pain management, Self Care/advanced ADL retraining, Therapeutic Activities, UE/LE Coordination activities, Cognitive remediation/compensation, Disease mangement/prevention, Functional mobility training, Patient/family education, Skin care/wound managment, Therapeutic Exercise, Visual/perceptual remediation/compensation, Community reintegration, Engineer, drilling, Neuromuscular re-education, Psychosocial support, Splinting/orthotics, UE/LE Strength taining/ROM  SLP Interventions Cognitive remediation/compensation, Patient/family education, Therapeutic Activities, Functional tasks  TR Interventions    SW/CM Interventions Discharge Planning, Psychosocial Support, Patient/Family Education, Disease Management/Prevention   Barriers to Discharge MD  Medical stability  Nursing Decreased caregiver support, Home environment access/layout 1 level 1 ste no rails w spouse  PT      OT      SLP      SW Decreased caregiver support, Lack of/limited family support     Team Discharge Planning: Destination: PT-Home ,OT- Home , SLP-Home Projected Follow-up: PT-Outpatient PT, 24 hour supervision/assistance, OT-  Outpatient OT, SLP-Other (comment) (TBD) Projected Equipment Needs: PT-To be determined, OT- To be determined, Other (comment) (may need shower seat with back), SLP-None recommended by  SLP Equipment Details: PT- , OT-  Patient/family involved in discharge planning: PT- Patient, Family member/caregiver,  OT-Patient, SLP-Patient  MD ELOS: 10-12d Medical Rehab Prognosis:  Good Assessment: The patient has been admitted for CIR therapies with the diagnosis of RIght MCA distribution infarct . The team will be addressing functional mobility, strength,  stamina, balance, safety, adaptive techniques and equipment, self-care, bowel and bladder mgt, patient and caregiver education, BP and anticoagulation management . Goals have been set at [facility] Anticipated discharge destination is Home.        See Team Conference Notes for weekly updates to the plan of care

## 2022-11-18 NOTE — Progress Notes (Signed)
[facility] Individual Statement of Services  Patient Name:  Damon Parrish  Date:  11/18/2022  Welcome to the [facility].  Our goal is to provide you with an individualized program based on your diagnosis and situation, designed to meet your specific needs.  With this comprehensive rehabilitation program, you will be expected to participate in at least 3 hours of rehabilitation therapies Monday-Friday, with modified therapy programming on the weekends.  Your rehabilitation program will include the following services:  Physical Therapy (PT), Occupational Therapy (OT), Speech Therapy (ST), 24 hour per day rehabilitation nursing, Therapeutic Recreaction (TR), Neuropsychology, Care Coordinator, Rehabilitation Medicine, Nutrition Services, and Pharmacy Services  Weekly team conferences will be held on Wednesdays to discuss your progress.  Your Inpatient Rehabilitation Care Coordinator will talk with you frequently to get your input and to update you on team discussions.  Team conferences with you and your family in attendance may also be held.  Expected length of stay: 9-12 Days  Overall anticipated outcome:  Supervision to MOD I  Depending on your progress and recovery, your program may change. Your Inpatient Rehabilitation Care Coordinator will coordinate services and will keep you informed of any changes. Your Inpatient Rehabilitation Care Coordinator's name and contact numbers are listed  below.  The following services may also be recommended but are not provided by the [facility]:   [place] will be made to provide these services after discharge if needed.  Arrangements include referral to agencies that provide these services.  Your insurance has been verified to be:  Medicare A & B Your primary doctor is:  Pradhan, Tilden Fossa, MD  Pertinent information  will be shared with your doctor and your insurance company.  Inpatient Rehabilitation Care Coordinator:  Erlene Quan, [place] or [phone]  Information discussed with and copy given to patient by: Dyanne Iha, 11/18/2022, 10:27 AM

## 2022-11-18 NOTE — Progress Notes (Signed)
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   GERD (gastroesophageal reflux disease)    H/O cerebral aneurysm repair 2016   "put stent in"   High cholesterol    History of blood transfusion ~ 1950   "while in hospital w/pneumonia" (01/09/2018)   History of kidney stones    Hypertension    Nonruptured cerebral aneurysm    Pneumonia ~ 1950   Red man syndrome    Seizures ([place]) 12/27/2015; 12/28/2015   Stroke ([place]) 12/2015   denies residual on 01/09/2018)   Past Surgical History:  Past Surgical History:  Procedure Laterality Date   CATARACT EXTRACTION W/ INTRAOCULAR LENS  IMPLANT, BILATERAL Bilateral    COLONOSCOPY     2013 per patient: done in [place], normal, next colonoscopy due in 10 years   FOOT FRACTURE SURGERY Left    "pins, etc. in there"   FRACTURE SURGERY     LOOP RECORDER INSERTION N/A 11/15/2022   Procedure: LOOP RECORDER INSERTION;  Surgeon: Melida Quitter, MD;  Location: [facility];  Service: Cardiovascular;  Laterality: N/A;   RADIOLOGY WITH ANESTHESIA N/A 05/04/2015   Procedure: Pipeline Embolization;  Surgeon: Consuella Lose, MD;  Location: [facility];  Service: Radiology;  Laterality: N/A;   RADIOLOGY WITH ANESTHESIA N/A 05/31/2015   Procedure: Pipeline Embolization;  Surgeon: Consuella Lose, MD;  Location: [facility];  Service: Radiology;  Laterality: N/A;   Social History:  reports that he has been smoking cigarettes. He has a 61.00 pack-year smoking history. He has never used smokeless tobacco. He reports that he does not drink alcohol  and does not use drugs.  Family / Support Systems Patient Roles: Spouse Spouse/Significant Other: Revonda Standard Children: Ronalee Belts and Laverna Peace (sons) Other Supports: N/A Anticipated Caregiver: Spouse, [name] Ability/Limitations of Caregiver: Supervision for mobility and Min A for [employer] Caregiver Availability: 24/7 Family Dynamics: Support from spouse  Social History Preferred language: English Religion: Methodist Cultural Background: Patient full independent Education: [place] - How often do you need to have someone help you when you read instructions, pamphlets, or other written material from your doctor or pharmacy?: Never Writes: Yes Employment Status: Retired Date Retired/Disabled/Unemployed: Nurse, mental health Issues: N/A Guardian/Conservator: Actor   Abuse/Neglect Abuse/Neglect Assessment Can Be Completed: Yes Physical Abuse: Denies Verbal Abuse: Denies Sexual Abuse: Denies Exploitation of patient/patient's resources: Denies Self-Neglect: Denies  Patient response to: Social Isolation - How often do you feel lonely or isolated from those around you?: Never  Emotional Status Pt's affect, behavior and adjustment status: Pleasant Recent Psychosocial Issues: coping Psychiatric History: N/A Substance Abuse History: N/A  Patient / Family Perceptions, Expectations & Goals Pt/Family understanding of illness & functional limitations: yes Premorbid pt/family roles/activities: Patient previously independent overall with spouse assisting with medications and finances Anticipated changes in roles/activities/participation: Spouse anticipates to continue and able to assist supervision with mobility and min a for ADL care Pt/family expectations/goals: Supervision to [place]: None Premorbid Home Care/DME Agencies: Other (Comment) (RW, Wheelchair, [employer] and Shower seat) Transportation available at discharge:  Spouse able to transport Is the patient able to respond to transportation needs?: Yes In the past 12 months, has lack of transportation kept you from medical appointments or from getting medications?: No In the past 12 months, has lack of transportation kept you from meetings, work, or from getting things needed for daily living?: No Resource referrals recommended: Neuropsychology  Discharge Planning Living Arrangements: Spouse/significant other Support Systems: Spouse/significant other Type of Residence: Private residence (1 level home, 1 step) Insurance Resources: Chartered certified accountant Resources: Family Support Financial Screen Referred: No Living Expenses: Own Money Management: Patient, Spouse Does the patient have any problems obtaining your medications?: No Home Management: Spouse assisting with money and medications Patient/Family Preliminary Plans: Spouse plans to continue Care Coordinator Barriers to Discharge: Decreased caregiver support, Lack of/limited family support Care Coordinator Anticipated Follow Up Needs: HH/OP Expected length of stay: 9-12 days  Clinical Impression Sw met with patient and spouse, introduced self and explained role. Patient spouse anticipates assisting patient at home 24/7 . Supervision with mobility and light Min A with ADLS if needed. Spouse expressed patient is doing really well today and anticipating d/c date this week. Patient has a WC, RW, SPC and Civil engineer, contracting. No additional questions or concerns.   Dyanne Iha 11/18/2022, 1:00 PM

## 2022-11-18 NOTE — Telephone Encounter (Signed)
Accepted Pt in [place].  Pt updated in [place].  Pt remains inpatient at [facility].    Will continue to monitor.

## 2022-11-19 NOTE — Progress Notes (Signed)
Physical Therapy Session Note  Patient Details  Name: Damon Parrish MRN: [id number] Date of Birth: 20-Feb-1941  Today's Date: 11/19/2022 PT Individual Time: 1540-1650 PT Individual Time Calculation (min): 70 min   Short Term Goals: Week 1:  PT Short Term Goal 1 (Week 1): Pt will perform supine<>sit mod-I PT Short Term Goal 2 (Week 1): Pt will perform sit<>stand transfers using LRAD with CGA PT Short Term Goal 3 (Week 1): Pt will perform bed<>chair transfers using LRAD with CGA PT Short Term Goal 4 (Week 1): Pt will ambulate at least 137f using LRAD with CGA PT Short Term Goal 5 (Week 1): Pt will demonstrate an increase by at least 8 points on the Berg Balance Scale  Skilled Therapeutic Interventions/Progress Updates:    Pt received supine in bed with his wife, Damon Parrish present and pt agreeable to therapy session. Pt reports he is ready to D/C - had pt's wife present throughout session to see his CLOF and help with D/C planning. Supine>sitting R EOB, HOB flat and not using bedrails, distant supervision. Sitting EOB donned tennis shoes without assist.   Sit>stand EOB>no AD, with CGA/close supervision for safety.   Gait training ~1549fto main therapy gym, no AD, with CGA for steadying due to pt having minor R anterior LOB - added horizontal head turning for dynamic gait challenge with increased instability noted - achieves reciprocal stepping pattern with increased width in his BOS and excessive B LE hip external rotation (R>L).  Pt's wife reports pt used SPC prior. Therapist provided one to pt and ensured proper height.   Gait training additional >20041fsing SPC in L UE with CGA initially progressing towards close supervision including navigating through ADL apartment to target turning for home environment - pt demos improved stability compared to without AD, but still minor instability - pt's wife reports his walking looks the same as prior to hospitalization and states he is "no more unsteady  than before." Pt continues to have slightly wider BOS with excessive hip external rotation bilaterally (R>L) and places SPC not quite far enough in front of his BOS. Therapist educated on recommendation for follow-up OPPT due to pt's score on balance assessment and pt still being at high fall risk - both verbalize understanding and in agreement.   Simulated ambulatory car transfer (sedan height) using SPC with close supervision for safety -  pt demos poor eccentric control when going to sit down via side step technique, but demos safe technique exiting the vehicle placing feet on ground prior to coming to stand.  Dynamic gait training using agility ladder including the following:  - forward reciprocal stepping 1 lap - progressed to doing this while holding 5lb tidal tank weight for balance challenge 1lap - side stepping 1 lap (down/back) - added 5lb tidal tank 1 lap - more difficulty noted stepping towards R compared to L - forward 2 feet in/[id number] 1 lap - added 5lb tidal tank 1lap - requires max cuing for sequencing directions of steps  Dynamic stepping balance via forward/backwards stepping over 6" hurdle - pt having greatest difficulty stepping backwards when leading with L foot due to poor balance on L LE while lifting and bringing R foot back second  - CGA/light min assist for balance.  Transitioned to forward/backwards stepping over stick while holding 5lb tidal tank with pt continuing to have same difficulty, but less prevalent due to lower height of obstacle.  Pt's wife reports he broke his L foot in 2009 when  he fell off the roof at their home potentially contributing to impairments with that side.  Dynamic sit<>stands while performing overhead press with 5lb tidal tank once in standing 2x10 reps with cuing for increased anterior weight shift while coming to stand and not pushing backs of legs against mat for balance. Standing heel raises while performing simultaneous overhead press with  5lb tidal tank x10reps with CGA/min assist due to posterior LOB with pt having to take multiple steps to recover and then needing assistance because the steps were insufficient to regain balance.  Dynamic stepping balance re-training of posterior lean with release of support ~15reps with pt demonstrating improvement from total assist to catch LOB to CGA after improved motor planning - pt tends to lead with L foot stepping backwards to regain balance.  Pt's wife reports after observing pt's balance impairments she would feel most comfortable if pt used RW at home - therapist educated that using RW would increase his safety and independence with functional mobility compared to the [facility] - pt in agreement.  Gait training ~126f back to his room using SPC in L UE and requiring CGA with ~3-5x min assist due to minor anterior LOB likely due to fatigue. Sit>supine mod-I. Pt left supine in bed with needs in reach, bed alarm on, and his wife present.   Therapy Documentation Precautions:  Precautions Precautions: Fall, Other (comment) Precaution Comments: L hemi, mild impulsivity, monitor HR, hx of seizures, loop recorder Restrictions Weight Bearing Restrictions: No Other Position/Activity Restrictions: resting R tremor at baseline   Pain: Denies pain during session.   Therapy/Group: Individual Therapy  CTawana Scale, PT, DPT, NCS, CSRS 11/19/2022, 3:26 PM

## 2022-11-19 NOTE — Progress Notes (Signed)
Physical Therapy Session Note  Patient Details  Name: Damon Parrish MRN: [id number] Date of Birth: 1941/02/07  Today's Date: 11/19/2022 PT Individual Time: [id number] PT Individual Time Calculation (min): 59 min   Short Term Goals: Week 1:  PT Short Term Goal 1 (Week 1): Pt will perform supine<>sit mod-I PT Short Term Goal 2 (Week 1): Pt will perform sit<>stand transfers using LRAD with CGA PT Short Term Goal 3 (Week 1): Pt will perform bed<>chair transfers using LRAD with CGA PT Short Term Goal 4 (Week 1): Pt will ambulate at least 137f using LRAD with CGA PT Short Term Goal 5 (Week 1): Pt will demonstrate an increase by at least 8 points on the Berg Balance Scale  Skilled Therapeutic Interventions/Progress Updates:    Pt received supine in bed and agreeable to PT. Pt sat at EOB Mod I and donned shoes w/ set up assist to bring shoes to pt.   Gait training ~2058fx2 CGA w/o AD. Pt demonstrated a wide BOS and decreased foot clearance on the R. Pt cued to decrease gait speed to prevent LOB. Horizontal head turns resulted in lateral postural sway pt cued to slow down.   Agility ladder balance drills  -Lateral stepping w/ therapist in front providing HHA. Pt cued to shift weight laterally on to stance leg then advance contralateral limb x1 -Forward Stepping in and out w/ CGA and vc for sequence of movement x1 -Forward stepping w/ x8 cone taps CGA. When tapping w/ R foot pt had decreased control. Therapist cued pt to shift wt laterally on to L foot and find COB then tap cone w/ R. Moderate vc given throughout exercise to improve balance and motor control performed x2   Lateral step ups on to 4" step inside parallel bars 1x10 to work on single limb balance and strength. Pt performed dynamic standing balance task of tapping on different colors arranged around pt in a semi-circle. Therapist called a color and pt instructed to tap the color w/o moving stance limb. Pt performed task 2x10 on each foot  w/ CGA. Vc for pt to wt shift on to stance leg and find COB before trying to tap w/ the contralateral foot. Posterior LOB when stepping across w/ R foot. Pt sat back to sit on mat w/ therapist providing Min A to control descent.   Pt ambulated back to room and doffed shoes independently in sitting and transferred back to supine Mod I. [facility] on, call bell in reach and all needs met.      Therapy Documentation Precautions:  Precautions Precautions: Fall, Other (comment) Precaution Comments: L hemi, mild impulsivity, monitor HR, hx of seizures, loop recorder Restrictions Weight Bearing Restrictions: No Other Position/Activity Restrictions: resting R tremor at baseline General:    Pain: pt denies any pain        Therapy/Group: Individual Therapy  Sherlene Rickel 11/19/2022, 12:01 PM

## 2022-11-19 NOTE — Progress Notes (Signed)
   11/19/22 1500  Spiritual Encounters  Type of Visit Initial  Care provided to: Patient;Family  Referral source Family  Reason for visit Routine spiritual support  OnCall Visit No  Spiritual Framework  Community/Connection Family  Goals  Clinical Care Goals get discharge by Friday  Interventions  Spiritual Care Interventions Made Compassionate presence;Reflective listening;Normalization of emotions  Intervention Outcomes  Outcomes Awareness of support    Pt's was referred to Ch by Pt's son Damon Parrish. He is visiting from [place]. Pt's wife was at bedside. Pt's family is a source of comfort and encouragement. Pt is motivated to work on his balance so that he can go home by this Friday. Pt's wife is please with pt's progress. Ch provided hospitality and compassionate presence. No follow-up needed at this time.

## 2022-11-19 NOTE — Progress Notes (Signed)
PROGRESS NOTE   Subjective/Complaints:  Pt in bed without c/os, slept well, denies bowel or bladder issues   ROS: Denies fevers, chills, CP, SOB, abd pain, N/V/D,  Objective:   No results found. Recent Labs    11/18/22 0658  WBC 7.2  HGB 14.7  HCT 42.9  PLT 211    Recent Labs    11/18/22 0658  NA 130*  K 3.7  CL 101  CO2 22  GLUCOSE 101*  BUN 15  CREATININE 0.95  CALCIUM 8.7*     Intake/Output Summary (Last 24 hours) at 11/19/2022 0910 Last data filed at 11/19/2022 0820 Gross per 24 hour  Intake 814 ml  Output 550 ml  Net 264 ml         Physical Exam: Vital Signs Blood pressure 137/88, pulse 64, temperature (!) 97.5 F (36.4 C), temperature source Oral, resp. rate 16, height '5\' 9"'$  (1.753 m), weight 81.8 kg, SpO2 94 %.   General: No acute distress Mood and affect are appropriate Heart: Regular rate and rhythm no rubs murmurs or extra sounds Lungs: Clear to auscultation, breathing unlabored, no rales or wheezes Abdomen: Positive bowel sounds, soft nontender to palpation, nondistended Extremities: No clubbing, cyanosis, or edema Skin: No evidence of breakdown, no evidence of rash Neurologic: Cranial nerves II through XII intact, motor strength is 5/5 in bilateral deltoid, bicep, tricep, grip, hip flexor, knee extensors, ankle dorsiflexor and plantar flexor    PRIOR EXAM: MSK:      No apparent deformity.      Strength:                5/5 RUE and RLE, 4+ LUE and 5/5 LLE   Neurologic exam:  Cognition: AAO to person, place, time and event.  Language: Fluent, No substitutions or neoglisms. No dysarthria. Insight: Good insight into current condition.  Mood: Pleasant affect, appropriate mood.  Sensation: To light touch intact in BL UEs and LEs   CN: 2-12 grossly intact.  Coordination: No ataxia Spasticity: MAS 0 in all extremities.   Assessment/Plan: 1. Functional deficits which require 3+  hours per day of interdisciplinary therapy in a comprehensive inpatient rehab setting. Physiatrist is providing close team supervision and 24 hour management of active medical problems listed below. Physiatrist and rehab team continue to assess barriers to discharge/monitor patient progress toward functional and medical goals  Care Tool:  Bathing    Body parts bathed by patient: Right arm, Left arm, Chest, Abdomen, Front perineal area, Buttocks, Right upper leg, Left upper leg, Right lower leg, Left lower leg, Face         Bathing assist Assist Level: Contact Guard/Touching assist (standing with RW/ sitting in wc at sink)     Upper Body Dressing/Undressing Upper body dressing   What is the patient wearing?: Pull over shirt    Upper body assist Assist Level: Supervision/Verbal cueing    Lower Body Dressing/Undressing Lower body dressing      What is the patient wearing?: Underwear/pull up, Pants     Lower body assist Assist for lower body dressing: Minimal Assistance - Patient > 75%     Toileting Toileting    Toileting assist  Assist for toileting: Minimal Assistance - Patient > 75%     Transfers Chair/bed transfer  Transfers assist     Chair/bed transfer assist level: Minimal Assistance - Patient > 75%     Locomotion Ambulation   Ambulation assist      Assist level: Minimal Assistance - Patient > 75% Assistive device: No Device Max distance: 138f   Walk 10 feet activity   Assist     Assist level: Minimal Assistance - Patient > 75% Assistive device: No Device   Walk 50 feet activity   Assist    Assist level: Minimal Assistance - Patient > 75% Assistive device: No Device    Walk 150 feet activity   Assist Walk 150 feet activity did not occur: Safety/medical concerns         Walk 10 feet on uneven surface  activity   Assist     Assist level: Minimal Assistance - Patient > 75% Assistive device: Other (comment) (railing)    Wheelchair     Assist Is the patient using a wheelchair?: Yes Type of Wheelchair: Manual (for transport)    Wheelchair assist level: Dependent - Patient 0%      Wheelchair 50 feet with 2 turns activity    Assist        Assist Level: Dependent - Patient 0%   Wheelchair 150 feet activity     Assist      Assist Level: Dependent - Patient 0%   Blood pressure 137/88, pulse 64, temperature (!) 97.5 F (36.4 C), temperature source Oral, resp. rate 16, height '5\' 9"'$  (1.753 m), weight 81.8 kg, SpO2 94 %.  Medical Problem List and Plan: 1. 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  Antithrombotics: -DVT/anticoagulation:  Pharmaceutical: Lovenox '40mg'$  QD although pt is amb well at this point would cont lovenox due to lung nodule which may cause a hypercoagulable state   -11/16/22 Dopplers negative on 11/14/22 -antiplatelet therapy: aspirin '81mg'$  QD and Plavix '75mg'$  QD for three weeks followed by Plavix alone (stop aspirin 3/14)   3. Pain Management: Tylenol as needed, Robaxin '500mg'$  q6h PRN   4. Mood/Behavior/Sleep: LCSW to evaluate and provide emotional support             -antipsychotic agents: n/a  -Trazodone PRN   5. Neuropsych/cognition: This patient is capable of making decisions on his own behalf.   6. Skin/Wound Care: Routine skin care checks   7. Fluids/Electrolytes/Nutrition: Routine Is and Os and follow-up chemistries next 11/18/22   8: Hypertension: monitor TID and prn (home Norvasc '10mg'$  QD held)  -11/19/22 BP stable, monitor for now   Vitals:   11/15/22 2055 11/16/22 0444 11/16/22 1030 11/16/22 1428  BP: (!) 146/76 (!) 142/87 121/73 (!) 144/78   11/16/22 1922 11/17/22 0445 11/17/22 1435 11/17/22 1936  BP: 139/80 128/74 (!) 157/73 (!) 149/94   11/18/22 0442 11/18/22 1243 11/18/22 1912  11/19/22 0531  BP: (!) 140/78 128/80 (!) 144/88 137/88      9: Hyperlipidemia: continue statin: now on high-intensity>>Lipitor 80 mg daily   10: BPH: continue Proscar 5 mg daily (home Flomax 0.'4mg'$  QD not restarted)   11: Prior history of stroke with seizure activity: follows with Dr. ADelice Lesch            -  Keppra 500 mg BID -11/16/22 EEG overnight 11/15/22 suggestive of cortical dysfunction arising from right hemisphere likely secondary to underlying structural abnormality. No seizures or epileptiform discharges were seen throughout the recording.   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 Cont regimen. Monitor for oversoftening  2/26 having BMs daily ,none so far 2/27 17: Tobacco use: cessation counseling; declines nicotine patch    LOS: 4 days A FACE TO FACE EVALUATION WAS PERFORMED  ACharlett Blake2/27/2024, 9:10 AM

## 2022-11-19 NOTE — Progress Notes (Signed)
Occupational Therapy Session Note  Patient Details  Name: Damon Parrish MRN: [id number] Date of Birth: 1941-01-10  Today's Date: 11/19/2022 OT Individual Time: [id number] OT Individual Time Calculation (min): 74 min    Short Term Goals: Week 1:  OT Short Term Goal 1 (Week 1): Pt will complete LB dressing with CGA consistently using LRAD OT Short Term Goal 2 (Week 1): Pt will demonstrate increased safety awareness during BADLs/Functional tasks by recalling 3/3 learned strategies OT Short Term Goal 3 (Week 1): Pt will maintian attention on functional task/BADLs in noisey environment ~5 minutes with min cueing  Skilled Therapeutic Interventions/Progress Updates:     Pt received resting in bed upon OT arrival presenting to being in good spirits and receptive to skilled OT session. Pt reporting 0/10 pain and that he slept well. Pt requesting to take shower this AM. Pt ambulated in his room without AD to gather clothing items from drawers and closet with close supervision. Pt noted to occasionally run into obstacles on L side requiring min cueing to attend to L side and avoid further obstacles. Pt ambulated to bathroom and transferred to toilet with CGA for balance and safety. Upon Pt doffing pants, Pt noted to have had incontinent BM with BM on Pt BLEs and posterior peri areas. Increased time required for clean up with OT providing assistance for time management. Pt able to maintain standing balance using grab bars with supervision. Pt transferred to tub bench in walk in shower with close supervision. Pt able to complete U/LB bathing with supervision mod cueing for safety including foot positioning when standing to wash bottom, grab bar use, and weight shifting d/t posterior lean. Following shower, pt ambulated to sink without AD and completed UB dressing seated EOB supervision with Pt able to button 8/8 buttons with increased time. Pt completed LB dressing with min A for weaving legs d/t decreased sitting  balance and problem solving skills. Stood to bring pants to wasit with supervision. Pt completed grooming/hygiene at sink in standing without AD for endurance and dynamic standing balance training. Pt able to maintain balance and attend to task without cueing required with SBA. Pt wife enetering room with family education provided at end of session. Education provided on need for 24/7 supervision, fall prevention, and energy conservation strategies to increase Pt safety upon d/c with both Pt and wife receptive. Pt left resting in bed with call bell in reach, bed alarm on, and all needs met with with in room.   Therapy Documentation Precautions:  Precautions Precautions: Fall, Other (comment) Precaution Comments: L hemi, mild impulsivity, monitor HR, hx of seizures, loop recorder Restrictions Weight Bearing Restrictions: No Other Position/Activity Restrictions: resting R tremor at baseline General:   Vital Signs: Oxygen Therapy O2 Device: Room Air Pain:   ADL: ADL Eating: Independent Where Assessed-Eating: Edge of bed Grooming: Contact guard Where Assessed-Grooming: Standing at sink Upper Body Bathing: Supervision/safety Where Assessed-Upper Body Bathing: Shower Lower Body Bathing: Supervision/safety Where Assessed-Lower Body Bathing: Shower Upper Body Dressing: Supervision/safety Where Assessed-Upper Body Dressing: Edge of bed Lower Body Dressing: Minimal assistance Where Assessed-Lower Body Dressing: Edge of bed Toileting: Contact guard Where Assessed-Toileting: Glass blower/designer: Close supervision Toilet Transfer Method: Counselling psychologist: Energy manager: Not assessed Social research officer, government: Curator Method: Heritage manager: Civil engineer, contracting with back   Therapy/Group: Individual Therapy  Janey Genta 11/19/2022, 10:12 AM

## 2022-11-20 NOTE — Plan of Care (Signed)
  Problem: RH Balance Goal: LTG Patient will maintain dynamic standing with ADLs (OT) Description: LTG:  Patient will maintain dynamic standing balance with assist during activities of daily living (OT)  Outcome: Completed/Met   Problem: RH Bathing Goal: LTG Patient will bathe all body parts with assist levels (OT) Description: LTG: Patient will bathe all body parts with assist levels (OT) Outcome: Completed/Met   Problem: RH Dressing Goal: LTG Patient will perform lower body dressing w/assist (OT) Description: LTG: Patient will perform lower body dressing with assist, with/without cues in positioning using equipment (OT) Outcome: Completed/Met   Problem: RH Toileting Goal: LTG Patient will perform toileting task (3/3 steps) with assistance level (OT) Description: LTG: Patient will perform toileting task (3/3 steps) with assistance level (OT)  Outcome: Completed/Met   Problem: RH Simple Meal Prep Goal: LTG Patient will perform simple meal prep w/assist (OT) Description: LTG: Patient will perform simple meal prep with assistance, with/without cues (OT). Outcome: Completed/Met   Problem: RH Light Housekeeping Goal: LTG Patient will perform light housekeeping w/assist (OT) Description: LTG: Patient will perform light housekeeping with assistance, with/without cues (OT). Outcome: Completed/Met   Problem: RH Toilet Transfers Goal: LTG Patient will perform toilet transfers w/assist (OT) Description: LTG: Patient will perform toilet transfers with assist, with/without cues using equipment (OT) Outcome: Completed/Met   Problem: RH Tub/Shower Transfers Goal: LTG Patient will perform tub/shower transfers w/assist (OT) Description: LTG: Patient will perform tub/shower transfers with assist, with/without cues using equipment (OT) Outcome: Completed/Met   Problem: RH Attention Goal: LTG Patient will demonstrate this level of attention during functional activites (OT) Description: LTG:   Patient will demonstrate this level of attention during functional activites  (OT) Outcome: Completed/Met   Problem: RH Awareness Goal: LTG: Patient will demonstrate awareness during functional activites type of (OT) Description: LTG: Patient will demonstrate awareness during functional activites type of (OT) Outcome: Completed/Met

## 2022-11-20 NOTE — Progress Notes (Signed)
Speech Language Pathology Discharge Summary  Patient Details  Name: Damon Parrish MRN: [id number] Date of Birth: 1941/04/04  Date of Discharge from SLP service:November 20, 2022  Today's Date: 11/20/2022 SLP Individual Time: 1445-1530 SLP Individual Time Calculation (min): 45 min  Skilled Therapeutic Interventions:  Skilled ST treatment focused on cognitive goals. Pt greeted semi-reclined in bed and eager to be discharging tomorrow. Pt agreeable to cognitive re-assessment through the [facility]. Scores are suggestive of improvement as evidenced by a 6 point improvement (25/30) as compared to score obtained on 11/15/22. Pt continues to present with decreased information processing, reduced short-term memory, and high level problem solving. Pt acknowledges improvement, however still feels slightly below cognitive baseline. SLP provided education on living a brain healthy lifestyle and provided handout as reinforcement. Discussed recommendation for OPSLP services to further address cognition. Pt verbalized understanding and agreement. No further questions/concerns at this time. Patient was left in bed with alarm activated and immediate needs within reach at end of session.   Patient has met 3 of 3 long term goals.  Patient to discharge at overall Supervision level.  Reasons goals not met: N/A   Clinical Impression/Discharge Summary: Patient has made functional gains and has met 3 of 3 long-term goals this admission due to improved cognitive-linguistic skills, further supported by improved score on objective cognitive assessment. Patient is currently completing functional and complex cognitive tasks with supervision A cues in regards to problem solving, memory, and anticipatory awareness. Patient education is complete and patient to discharge at overall supervision level. Patient's care partner is independent to provide the necessary physical and cognitive assistance at discharge. Patient would benefit from  continued SLP services in outpatient setting to maximize cognitive function and functional independence.    Care Partner:  Caregiver Able to Provide Assistance: Yes  Type of Caregiver Assistance: Physical;Cognitive  Recommendation:  Outpatient SLP;Other (comment) (intermittent supervision)  Rationale for SLP Follow Up: Maximize cognitive function and independence   Equipment: N/A   Reasons for discharge: Discharged from hospital;Treatment goals met   Patient/Family Agrees with Progress Made and Goals Achieved: Yes    [name] T Cobie Leidner 11/20/2022, 4:22 PM

## 2022-11-20 NOTE — Progress Notes (Signed)
Physical Therapy Session Note  Patient Details  Name: Damon Parrish MRN: [id number] Date of Birth: 16-Mar-1941  Today's Date: 11/20/2022 PT Individual Time: 0807-0903 PT Individual Time Calculation (min): 56 min   Short Term Goals: Week 1:  PT Short Term Goal 1 (Week 1): Pt will perform supine<>sit mod-I PT Short Term Goal 2 (Week 1): Pt will perform sit<>stand transfers using LRAD with CGA PT Short Term Goal 3 (Week 1): Pt will perform bed<>chair transfers using LRAD with CGA PT Short Term Goal 4 (Week 1): Pt will ambulate at least 112f using LRAD with CGA PT Short Term Goal 5 (Week 1): Pt will demonstrate an increase by at least 8 points on the Berg Balance Scale  Skilled Therapeutic Interventions/Progress Updates:    Pt received awake, supine in bed and agreeable to therapy session. Supine>sitting L EOB, HOB flat and not using bedrails, mod-I with increased effort. Sitting EOB donned shoes without assist. Sit>stand EOB>RW with supervision for safety. Gait training ~1836fto main therapy gym using RW with close supervision - pt requiring occasional cuing for safe AD management , 1x trying to continue walking forward with 1 hand on AD and pulling up his pants with the other causing minor anterior LOB. Reinforced education on recommendation to use RW initially upon D/C.   Stair navigation training ascending/descending 12 steps (6" height) using B HRs with supervision for safety - pt self selects reciprocal pattern on ascent, starting with step-to leading with R LE on descent progressed to reciprocal pattern with good control and no instances of LOB.   [facility] demonstrates increased fall risk as noted by score of 37/56; however, this is a clinically significant improvement compared to the 24/56 on 11/16/22.  (<36= high risk for falls, close to 100%; 37-45 significant >80%; 46-51 moderate >50%; 52-55 lower >25%). Pt continues to have greatest difficulty with  items that require narrow stance (NBOS, tandem, or SLS), foot taps, and reaching outside BOS.  Dynamic balance task of alternating foot taps to 8" step, no UE support, with CGA for steadying/safety and max cuing to maintain upright posture to improve balance as pt does have minor LOB ~30% of the time but is able to utilize balance recovery strategies without increased physical assistance.  Standing balance task:  - semi-tandem L foot forward with pt having increased instability 30sec with 1x L posterior LOB requiring min assist to stay upright - semi-tandem with R foot forward with pt having more stability to progress to slow, horizontal head turns with close guarding 30sec + 5 head turns  Gait training ~18025fack to room using RW with supervision for safety and min cuing to remain close to RW and keep upright posture, not flexing forward into it. Sit>supine mod-I. Pt left supine in bed with needs in reach, MD present for morning assessment, and bed alarm on.    Therapy Documentation Precautions:  Precautions Precautions: Fall, Other (comment) Precaution Comments: L hemi, mild impulsivity, monitor HR, hx of seizures, loop recorder Restrictions Weight Bearing Restrictions: No Other Position/Activity Restrictions: resting R tremor at baseline   Pain:  Denies pain during session.   Balance: Balance Balance Assessed: Yes Standardized Balance Assessment Standardized Balance Assessment: Berg Balance Test Berg Balance Test Sit to Stand: Able to stand without using hands and stabilize independently Standing Unsupported: Able to stand safely 2 minutes (continues to have min wider BOS) Sitting with Back Unsupported but Feet Supported on Floor or Stool: Able to sit  safely and securely 2 minutes Stand to Sit: Controls descent by using hands Transfers: Able to transfer safely, minor use of hands Standing Unsupported with Eyes Closed: Able to stand 10 seconds with supervision (slight postural  sway) Standing Ubsupported with Feet Together: Able to place feet together independently and stand for 1 minute with supervision (heels touching but hip external rotation causing toes to be apart) From Standing, Reach Forward with Outstretched Arm: Can reach forward >5 cm safely (2") From Standing Position, Pick up Object from Floor: Able to pick up shoe, needs supervision From Standing Position, Turn to Look Behind Over each Shoulder: Needs supervision when turning (doesn't rotate hips towards L; continues to show slight instability requiring supervision for safety) Turn 360 Degrees: Needs close supervision or verbal cueing Standing Unsupported, Alternately Place Feet on Step/Stool: Able to complete 4 steps without aid or supervision (8" step, requires close supervision and pt able to regain balance when it occurs without hands-on assist) Standing Unsupported, One Foot in Front: Able to take small step independently and hold 30 seconds Standing on One Leg: Tries to lift leg/unable to hold 3 seconds but remains standing independently (close supervision for safety but no hands-on assist when standing on R LE, attempted on L but had LOB requiring assist) Total Score: 37    Therapy/Group: Individual Therapy  Tawana Scale , PT, DPT, NCS, CSRS 11/20/2022, 7:49 AM

## 2022-11-20 NOTE — Discharge Instructions (Addendum)
Inpatient Rehab Discharge Instructions  Damon Parrish Discharge date and time:  11/21/2022  Activities/Precautions/ Functional Status: Activity: no lifting, driving, or strenuous exercise until cleared by MD Diet: cardiac diet Wound Care: none needed Functional status:  ___ No restrictions     ___ Walk up steps independently ___ 24/7 supervision/assistance   ___ Walk up steps with assistance _x__ Intermittent supervision/assistance  ___ Bathe/dress independently ___ Walk with walker     ___ Bathe/dress with assistance ___ Walk Independently    ___ Shower independently ___ Walk with assistance    __x_ Shower with assistance _x__ No alcohol     ___ Return to work/school ________  Special Instructions: No driving, alcohol consumption or tobacco use. Check blood pressure in same arm daily and record. Bring to follow-up visit with your PCP.  COMMUNITY REFERRALS UPON DISCHARGE:     Outpatient: PT     OT                Agency: Outpatient at [facility] Phone: [phone]              Appointment Date/Time: Please allow 3-4 days for scheduling to reach out.    STROKE/TIA DISCHARGE INSTRUCTIONS SMOKING Cigarette smoking nearly doubles your risk of having a stroke & is the single most alterable risk factor  If you smoke or have smoked in the last 12 months, you are advised to quit smoking for your health. Most of the excess cardiovascular risk related to smoking disappears within a year of stopping. Ask you doctor about anti-smoking medications  Quit Line: 1-800-QUIT NOW Free Smoking Cessation Classes [phone]  CHOLESTEROL Know your levels; limit fat & cholesterol in your diet  Lipid Panel     Component Value Date/Time   CHOL 146 11/14/2022 0018   TRIG 96 11/14/2022 0018   HDL 46 11/14/2022 0018   CHOLHDL 3.2 11/14/2022 0018   VLDL 19 11/14/2022 0018   LDLCALC 81 11/14/2022 0018     Many patients benefit from treatment even if their cholesterol is at goal. Goal: Total  Cholesterol (CHOL) less than 160 Goal:  Triglycerides (TRIG) less than 150 Goal:  HDL greater than 40 Goal:  LDL (LDLCALC) less than 100   BLOOD PRESSURE American Stroke Association blood pressure target is less that 120/80 mm/Hg  Your discharge blood pressure is:  BP: (!) 148/68 Monitor your blood pressure Limit your salt and alcohol intake Many individuals will require more than one medication for high blood pressure  DIABETES (A1c is a blood sugar average for last 3 months) Goal HGBA1c is under 7% (HBGA1c is blood sugar average for last 3 months)  Diabetes: No known diagnosis of diabetes    Lab Results  Component Value Date   HGBA1C 6.1 (H) 11/13/2022    Your HGBA1c can be lowered with medications, healthy diet, and exercise. Check your blood sugar as directed by your physician Call your physician if you experience unexplained or low blood sugars.  PHYSICAL ACTIVITY/REHABILITATION Goal is 30 minutes at least 4 days per week  Activity: Increase activity slowly, Therapies: Physical Therapy: Outpatient and Occupational Therapy: Outpatient Return to work: n/a Activity decreases your risk of heart attack and stroke and makes your heart stronger.  It helps control your weight and blood pressure; helps you relax and can improve your mood. Participate in a regular exercise program. Talk with your doctor about the best form of exercise for you (dancing, walking, swimming, cycling).  DIET/WEIGHT Goal is to maintain a healthy  weight  Your discharge diet is:  Diet Order             Diet Heart Room service appropriate? Yes; Fluid consistency: Thin  Diet effective now                  thin liquids Your height is:  Height: '5\' 9"'$  (175.3 cm) Your current weight is: Weight: 81.8 kg Your Body Mass Index (BMI) is:  BMI (Calculated): 26.62 Following the type of diet specifically designed for you will help prevent another stroke. Your goal weight range is:    Your goal Body Mass Index (BMI)  is 19-24. Healthy food habits can help reduce 3 risk factors for stroke:  High cholesterol, hypertension, and excess weight.  RESOURCES Stroke/Support Group:  Call [phone]   STROKE EDUCATION PROVIDED/REVIEWED AND GIVEN TO PATIENT Stroke warning signs and symptoms How to activate emergency medical system (call 911). Medications prescribed at discharge. Need for follow-up after discharge. Personal risk factors for stroke. Pneumonia vaccine given: No Flu vaccine given: No My questions have been answered, the writing is legible, and I understand these instructions.  I will adhere to these goals & educational materials that have been provided to me after my discharge from the hospital.     My questions have been answered and I understand these instructions. I will adhere to these goals and the provided educational materials after my discharge from the hospital.  Patient/Caregiver Signature _______________________________ Date __________  Clinician Signature _______________________________________ Date __________  Please bring this form and your medication list with you to all your follow-up doctor's appointments.

## 2022-11-20 NOTE — Plan of Care (Signed)
?  Problem: RH Problem Solving ?Goal: LTG Patient will demonstrate problem solving for (SLP) ?Description: LTG:  Patient will demonstrate problem solving for basic/complex daily situations with cues  (SLP) ?Outcome: Completed/Met ?  ?Problem: RH Memory ?Goal: LTG Patient will use memory compensatory aids to (SLP) ?Description: LTG:  Patient will use memory compensatory aids to recall biographical/new, daily complex information with cues (SLP) ?Outcome: Completed/Met ?  ?Problem: RH Awareness ?Goal: LTG: Patient will demonstrate awareness during functional activites type of (SLP) ?Description: LTG: Patient will demonstrate awareness during functional activites type of (SLP) ?Outcome: Completed/Met ?  ?

## 2022-11-20 NOTE — Patient Care Conference (Signed)
Inpatient RehabilitationTeam Conference and Plan of Care Update Date: 11/20/2022   Time: 10:47 AM    Patient Name: Damon Parrish      Medical Record Number: [id number]  Date of Birth: 1941-05-06 Sex: Male         Room/Bed: 4W02C/4W02C-01 Payor Info: Payor: MEDICARE / Plan: MEDICARE PART A AND B / Product Type: *No Product type* /    Admit Date/Time:  11/15/2022  8:25 PM  Primary Diagnosis:  Acute right MCA stroke [facility])  Hospital Problems: Principal Problem:   Acute right MCA stroke [facility])    Expected Discharge Date: Expected Discharge Date: 11/21/22  Team Members Present: Physician leading conference: Dr. Alysia Penna Social Worker Present: Erlene Quan, BSW Nurse Present: Dorien Chihuahua, RN PT Present: Page Spiro, PT OT Present: Other (comment) Lowella Fairy, OT) SLP Present: Sherren Kerns, SLP PPS Coordinator present : Gunnar Fusi, SLP     Current Status/Progress Goal Weekly Team Focus  Bowel/Bladder   Incontinent to bowel, continent to bladder, last BM per patient on 11/19/22   able to tell the staff for toileting needs.   Offer toileting Q2hrs while awake.    Swallow/Nutrition/ Hydration       N/A       ADL's   U/LB bathing supervision, UB dressing supervision, LB dressing min A, toilting supervision, toilet/shower transfer CGA- mildly impulsive with decreased safety awareness and mild L sided innatention   Supervision overall   family education, dynamic standing balance, U/LB strengthening, endurance training, fall prevention education, energy conservation, IADL retraining, BADL retraining    Mobility   mod-I bed mobility, supervision sit<>stand and stand pivot transfers using RW vs CGA with SPC, close supervision gait >121f using RW vs CGA using SPC, close supervision 12 stair navigation using B HRs - high fall risk as noted by score of 24/56 on Berg Balance Scale at initial evaluation improved to 37/56 on 11/20/22   supervision overall at ambulatory  level  pt/family education, dynamic standing balance, dynamic gait training using LRAD, stair navigation training, activity tolerance, L attention    Communication       N/A        Safety/Cognition/ Behavioral Observations  sup-to-min A   sup A   problem solving, anticipatory awareness, memory strategies, education    Pain   no c/o pain   remain pain free   assess Q shift and PRN    Skin   skin intact   skin remaain intact  assess Q shift and PRN      Discharge Planning:  Discharging home with spouse to assist 24/7   Team Discussion: Patient with left inattention, problem solving deficits and memory issues with occasional loss of balance post right MCA CVA.   Patient on target to meet rehab goals: yes, currently needs CGA for sit - stand and stand pivot transfers. Able to ambulate using a RW and manage steps with close supervision.  Goals for discharge set for supervision overall.  *See Care Plan and progress notes for long and short-term goals.   Revisions to Treatment Plan:  N/a  Teaching Needs: Safety, medications, dietary modifications, transfers, etc. Loop recorder maintenance  Current Barriers to Discharge: Decreased caregiver support and Home enviroment access/layout  Possible Resolutions to Barriers: Family education OP follow up services     Medical Summary Current Status: BP elevated , occ bowel incont  Barriers to Discharge: Incontinence;Uncontrolled Hypertension   Possible Resolutions to BR[employer] adjust medication for BP   Continued Need  for Acute Rehabilitation Level of Care: The patient requires daily medical management by a physician with specialized training in physical medicine and rehabilitation for the following reasons: Direction of a multidisciplinary physical rehabilitation program to maximize functional independence : Yes Medical management of patient stability for increased activity during participation in an intensive  rehabilitation regime.: Yes Analysis of laboratory values and/or radiology reports with any subsequent need for medication adjustment and/or medical intervention. : Yes   I attest that I was present, lead the team conference, and concur with the assessment and plan of the team.   Margarito Liner 11/20/2022, 3:48 PM

## 2022-11-20 NOTE — Progress Notes (Signed)
Physical Therapy Session Note  Patient Details  Name: Damon Parrish MRN: [id number] Date of Birth: December 19, 1940  Today's Date: 11/20/2022 PT Individual Time: [id number] PT Individual Time Calculation (min): 42 min   Short Term Goals: Week 1:  PT Short Term Goal 1 (Week 1): Pt will perform supine<>sit mod-I PT Short Term Goal 2 (Week 1): Pt will perform sit<>stand transfers using LRAD with CGA PT Short Term Goal 3 (Week 1): Pt will perform bed<>chair transfers using LRAD with CGA PT Short Term Goal 4 (Week 1): Pt will ambulate at least 168f using LRAD with CGA PT Short Term Goal 5 (Week 1): Pt will demonstrate an increase by at least 8 points on the Berg Balance Scale  Skilled Therapeutic Interventions/Progress Updates: Pt presents supine in bed and agreeable to therapy.  Pt transfers sup to sit from flat bed and w/o siderails w/ mod I.  Pt transfers sit to stand w/ supervision and SPC.  Pt amb > 150' w/ supervision and SPC w/o LOB although cues for upright posture 2/2 occasional scuffing of R toes.  Pt amb 150' w/ SPC and holding 6# weight in R hand.  Pt also challenged w/ scanning walls during gait answering questions given.  Pt amb w/o AD collecting colored discs in hallways.  Pt demos some inattention(?) to objects to right.  Pt negotiated obstacle course stepping over varying height hurdles w/ 1 misstep w/ higher obstacle.  Pt also able to pick up hurdles from floor w/ supervision.  [facility] 2/2 fatigue but able to correct self.  Pt returned to room and sat in recliner, spouse and NT present while NT changes linens.     Therapy Documentation Precautions:  Precautions Precautions: Fall, Other (comment) Precaution Comments: L hemi, mild impulsivity, monitor HR, hx of seizures, loop recorder Restrictions Weight Bearing Restrictions: No Other Position/Activity Restrictions: resting R tremor at baseline General:   Vital Signs: Oxygen Therapy SpO2: 97  % O2 Device: Room Air Pain:0/10   Mobility:   Locomotion :    Trunk/Postural Assessment :    Balance: Balance Balance Assessed: Yes Standardized Balance Assessment Standardized Balance Assessment: Berg Balance Test Berg Balance Test Sit to Stand: Able to stand without using hands and stabilize independently Standing Unsupported: Able to stand safely 2 minutes (continues to have min wider BOS) Sitting with Back Unsupported but Feet Supported on Floor or Stool: Able to sit safely and securely 2 minutes Stand to Sit: Controls descent by using hands Transfers: Able to transfer safely, minor use of hands Standing Unsupported with Eyes Closed: Able to stand 10 seconds with supervision (slight postural sway) Standing Ubsupported with Feet Together: Able to place feet together independently and stand for 1 minute with supervision (heels touching but hip external rotation causing toes to be apart) From Standing, Reach Forward with Outstretched Arm: Can reach forward >5 cm safely (2") From Standing Position, Pick up Object from Floor: Able to pick up shoe, needs supervision From Standing Position, Turn to Look Behind Over each Shoulder: Needs supervision when turning (doesn't rotate hips towards L; continues to show slight instability requiring supervision for safety) Turn 360 Degrees: Needs close supervision or verbal cueing Standing Unsupported, Alternately Place Feet on Step/Stool: Able to complete 4 steps without aid or supervision (8" step, requires close supervision and pt able to regain balance when it occurs without hands-on assist) Standing Unsupported, One Foot in Front: Able to take small step independently and hold 30  seconds Standing on One Leg: Tries to lift leg/unable to hold 3 seconds but remains standing independently (close supervision for safety but no hands-on assist when standing on R LE, attempted on L but had LOB requiring assist) Total Score: 37 Exercises:   Other  Treatments:      Therapy/Group: Individual Therapy  Damon Parrish 11/20/2022, 10:34 AM

## 2022-11-20 NOTE — Progress Notes (Addendum)
Patient ID: Damon Parrish, male   DOB: Jun 11, 1941, 82 y.o.   MRN: [id number]  SW left VM for spouse to discuss d/c tomorrow.    3:53PM: SW received FU from spouse in reference to discharge. Spouse will be present tomorrow to review medications. No additional questions or concerns.

## 2022-11-20 NOTE — Progress Notes (Signed)
Inpatient Rehabilitation Discharge Medication Review by a Pharmacist  A complete drug regimen review was completed for this patient to identify any potential clinically significant medication issues.  High Risk Drug Classes Is patient taking? Indication by Medication  Antipsychotic No   Anticoagulant No   Antibiotic No   Opioid No   Antiplatelet Yes Asa/plavix- cva ppx. ASA stop date: 3/14  Hypoglycemics/insulin No   Vasoactive Medication Yes Flomax- BPH  Chemotherapy No   Other Yes Lipitor- HLD Proscar- BPH Keppra- seizure ppx Trelegy- COPD     Type of Medication Issue Identified Description of Issue Recommendation(s)  Drug Interaction(s) (clinically significant)     Duplicate Therapy     Allergy     No Medication Administration End Date     Incorrect Dose     Additional Drug Therapy Needed     Significant med changes from prior encounter (inform family/care partners about these prior to discharge).    Other       Clinically significant medication issues were identified that warrant physician communication and completion of prescribed/recommended actions by midnight of the next day:  No   Time spent performing this drug regimen review (minutes):  30   Kimbra Marcelino BS, PharmD, BCPS Clinical Pharmacist 11/21/2022 9:09 AM  Contact: [phone] after 3 PM  "Be curious, not judgmental..." -Jamal Maes

## 2022-11-20 NOTE — Progress Notes (Signed)
Inpatient Rehabilitation Care Coordinator Discharge Note   Patient Details  Name: Damon Parrish MRN: [id number] Date of Birth: 1940/10/24   Discharge location: Home with spouse  Length of Stay: 6 Days  Discharge activity level: Sup/cga  Home/community participation: spouse  Patient response [id number] Literacy - How often do you need to have someone help you when you read instructions, pamphlets, or other written material from your doctor or pharmacy?: Never  Patient response [id number] Isolation - How often do you feel lonely or isolated from those around you?: Never  Services provided included: MD, PT, OT, RD, SLP, RN, CM, TR, Pharmacy, Neuropsych, SW  Financial Services:  Financial Services Utilized: Medicare    Choices offered to/list presented to: Patient  Follow-up services arranged:  Outpatient    Outpatient Servicies: [facility]      Patient response to transportation need: Is the patient able to respond to transportation needs?: Yes In the past 12 months, has lack of transportation kept you from medical appointments or from getting medications?: No In the past 12 months, has lack of transportation kept you from meetings, work, or from getting things needed for daily living?: No    Comments (or additional information):  Patient/Family verbalized understanding of follow-up arrangements:  Yes  Individual responsible for coordination of the follow-up plan: [name] [id number]  Confirmed correct DME delivered: Dyanne Iha 11/20/2022    Dyanne Iha

## 2022-11-20 NOTE — Discharge Summary (Signed)
Physical Therapy Discharge Summary  Patient Details  Name: Damon Parrish MRN: [id number] Date of Birth: 1941/09/05  Date of Discharge from PT service:November 20, 2022  Today's Date: 11/20/2022 PT Individual Time: [id number] PT Individual Time Calculation (min): 33 min    Patient has met 9 of 9 long term goals due to improved activity tolerance, improved balance, improved postural control, increased strength, ability to compensate for deficits, functional use of  left upper extremity and left lower extremity, improved attention, improved awareness, and improved coordination.  Patient to discharge at an ambulatory level Supervision using RW.   Patient's care partner is independent to provide the necessary physical and cognitive assistance at discharge.  All goals met.  Recommendation:  Patient will benefit from ongoing skilled PT services in outpatient setting to continue to advance safe functional mobility, address ongoing impairments in high level dynamic standing balance (stepping balance recovery training/slip training), dynamic gait training using LRAD, functional B LE strengthening, cardiopulmonary endurance training, and minimize fall risk.  Equipment: No equipment provided, pt has all necessary DME  Reasons for discharge: treatment goals met and discharge from hospital  Patient/family agrees with progress made and goals achieved: Yes  Skilled Therapeutic Interventions/Progress Updates:  Pt received supine in bed awake and agreeable to therapy session. Supine>sitting L EOB,  mod-I. Pt selecting to use SPC during this session and is able to verbalize understanding and in agreement with recommendation to use RW at D/C to further increase his independence and safety while decreasing fall risk. Sit<>stands using SPC with stand by assist for safety. Gait training ~154f x2 to/from main therapy gym using [place] L UE with close stand by assist and 2x CGA for re-steadying when going to gym with  pt reporting he is more unsteady at this time due to being stiff from lying down - continues to demo wide BOS with excessive B LE (R>L) hip external rotation and frequent R anterior LOB bias. Therapist provided pt with the following HEP printout and educated him on having his wife provide SBA/guarding for safety while he completed them using UE support on a counter as needed. Pt with no questions.  Access Code: [email or web address] URL: [email or web address] Date: 11/20/2022 Prepared by: CPage Spiro Exercises - Sit to Stand with Counter Support  - 1 x daily - 7 x weekly - 2 sets - 10 reps - Heel Raises with Counter Support  - 1 x daily - 7 x weekly - 2 sets - 10 reps - Alternating Step Taps with Counter Support  - 1 x daily - 7 x weekly - 2 sets - 10 reps - Narrow Stance with Counter Support  - 1 x daily - 7 x weekly - 2 sets - 30 seconds hold - Walking  - 1 x daily - 7 x weekly - 2 sets - 3 minutes hold  At end of session, pt left in bathroom with NT present to assume care of patient.  PT Discharge Precautions/Restrictions Precautions Precautions: Fall;Other (comment) Precaution Comments: mild impulsivity, hx of seizures, loop recorder Restrictions Weight Bearing Restrictions: No Other Position/Activity Restrictions: resting R tremor at baseline Pain Pain Assessment Pain Scale: 0-10 Pain Score: 0-No pain Pain Interference Pain Interference Pain Effect on Sleep: 0. Does not apply - I have not had any pain or hurting in the past 5 days Pain Interference with Therapy Activities: 1. Rarely or not at all Pain Interference with Day-to-Day Activities: 1. Rarely or not at all Vision/Perception  Vision - History Ability to  See in Adequate Light: 0 Adequate Perception Perception: Impaired Inattention/Neglect: Does not attend to left visual field (very mild decreased attention) Praxis Praxis: Impaired Praxis Impairment Details: Motor planning;Ideomotor (noticed more during  ADLs) Praxis-Other Comments: Improvement from evaluation  Cognition Overall Cognitive Status: Impaired/Different from baseline (per wife report, Pt cognition different from baseline with impairements in attention and awareness) Arousal/Alertness: Awake/alert Orientation Level: Oriented X4 Year: 2024 Month: February Day of Week: Correct Attention: Focused;Sustained Focused Attention: Appears intact Sustained Attention: Appears intact Memory: Impaired Awareness: Impaired Awareness Impairment: Anticipatory impairment Behaviors: Impulsive Safety/Judgment: Impaired Sensation Sensation Light Touch: Appears Intact Hot/Cold: Not tested Proprioception: Appears Intact Stereognosis: Not tested Coordination Gross Motor Movements are Fluid and Coordinated: Yes (GM movements are now at pt's baseline) Fine Motor Movements are Fluid and Coordinated: No Coordination and Movement Description: unable to detect L hemiparesis due to improvement since eval Finger Nose Finger Test: Mild undershooting and dysmetria on L side, improvement from evaluation Motor   Motor Motor: Other (comment);Abnormal postural alignment and control Motor - Discharge Observations: unable to detect any residual weakness from original L hemiparesis, remains at increased fall risk  Mobility Bed Mobility Bed Mobility: Supine to Sit;Sit to Supine Supine to Sit: Independent with assistive device Sit to Supine: Independent with assistive device Transfers Transfers: Sit to Stand;Stand to Sit;Stand Pivot Transfers Sit to Stand: Supervision/Verbal cueing Stand to Sit: Supervision/Verbal cueing Stand Pivot Transfers: Supervision/Verbal cueing Stand Pivot Transfer Details: Verbal cues for safe use of DME/AE;Verbal cues for precautions/safety;Verbal cues for gait pattern Transfer (Assistive device): Rolling walker Locomotion  Gait Ambulation: Yes Gait Assistance: Supervision/Verbal cueing Gait Distance (Feet): 200  Feet Assistive device: Rolling walker (requires SBA/CGA when using SPC) Gait Assistance Details: Verbal cues for sequencing;Verbal cues for gait pattern;Verbal cues for safe use of DME/AE;Verbal cues for precautions/safety Gait Gait: Yes Gait Pattern: Impaired Gait Pattern: Step-through pattern;Wide base of support;Poor foot clearance - left;Poor foot clearance - right (continues with excessive B LE hip external rotation (R>L)) Gait velocity: decreased but improved since initial eval Stairs / Additional Locomotion Stairs: Yes Stairs Assistance: Supervision/Verbal cueing Stair Management Technique: Two rails;Alternating pattern;Step to pattern Number of Stairs: 12 Height of Stairs: 6 Ramp: Supervision/Verbal cueing (RW) Wheelchair Mobility Wheelchair Mobility: No  Trunk/Postural Assessment  Cervical Assessment Cervical Assessment: Exceptions to [facility] (forward head) Thoracic Assessment Thoracic Assessment: Exceptions to [facility] (rounded shoulders) Lumbar Assessment Lumbar Assessment: Exceptions to [facility] Postural Control Postural Control: Deficits on evaluation (please see Berg Balance Scale) Righting Reactions: decreased Protective Responses: delayed, improved since evaluation  Balance Balance Balance Assessed: Yes Standardized Balance Assessment Standardized Balance Assessment: Berg Balance Test Berg Balance Test Sit to Stand: Able to stand without using hands and stabilize independently Standing Unsupported: Able to stand safely 2 minutes (continues to have min wider BOS) Sitting with Back Unsupported but Feet Supported on Floor or Stool: Able to sit safely and securely 2 minutes Stand to Sit: Controls descent by using hands Transfers: Able to transfer safely, minor use of hands Standing Unsupported with Eyes Closed: Able to stand 10 seconds with supervision (slight postural sway) Standing Ubsupported with Feet Together: Able to place feet together independently and stand for 1 minute  with supervision (heels touching but hip external rotation causing toes to be apart) From Standing, Reach Forward with Outstretched Arm: Can reach forward >5 cm safely (2") From Standing Position, Pick up Object from Floor: Able to pick up shoe, needs supervision From Standing Position, Turn to Look Behind Over each Shoulder: Needs supervision when  turning (doesn't rotate hips towards L; continues to show slight instability requiring supervision for safety) Turn 360 Degrees: Needs close supervision or verbal cueing Standing Unsupported, Alternately Place Feet on Step/Stool: Able to complete 4 steps without aid or supervision (8" step, requires close supervision and pt able to regain balance when it occurs without hands-on assist) Standing Unsupported, One Foot in Front: Able to take small step independently and hold 30 seconds Standing on One Leg: Tries to lift leg/unable to hold 3 seconds but remains standing independently (close supervision for safety but no hands-on assist when standing on R LE, attempted on L but had LOB requiring assist) Total Score: 37 Static Sitting Balance Static Sitting - Balance Support: Feet unsupported Static Sitting - Level of Assistance: 7: Independent Dynamic Sitting Balance Dynamic Sitting - Balance Support: Feet unsupported Dynamic Sitting - Level of Assistance: 6: Modified independent (Device/Increase time) Static Standing Balance Static Standing - Balance Support: During functional activity Static Standing - Level of Assistance: 5: Stand by assistance Dynamic Standing Balance Dynamic Standing - Balance Support: During functional activity;Bilateral upper extremity supported Dynamic Standing - Level of Assistance: 5: Stand by assistance Extremity Assessment  RLE Assessment RLE Assessment: Within Functional Limits Active Range of Motion (AROM) Comments: WFL/WNL General Strength Comments: assessed in sitting RLE Strength Right Hip Flexion: 4+/5 Right Knee  Flexion: 5/5 Right Knee Extension: 5/5 Right Ankle Dorsiflexion: 5/5 Right Ankle Plantar Flexion: 5/5 LLE Assessment LLE Assessment: Exceptions to [facility] Active Range of Motion (AROM) Comments: WFL/WNL General Strength Comments: assessed in sitting and strength now appears symmetrical LLE Strength Left Hip Flexion: 4+/5 Left Knee Flexion: 5/5 Left Knee Extension: 5/5 Left Ankle Dorsiflexion: 5/5 Left Ankle Plantar Flexion: 5/5   Dedra Matsuo M Sheela Mcculley , PT, DPT, NCS, CSRS 11/20/2022, 4:40 PM

## 2022-11-20 NOTE — Discharge Summary (Signed)
Physician Discharge Summary  Patient ID: LASARO CARAVANTES MRN: [id number] DOB/AGE: Oct 08, 1940 82 y.o.  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 Implantable loop recorder implantation   INTRODUCTION:  CHAQUAN ECKLEY presents with a history of cryptogenic stroke The costs of loop recorder monitoring have been discussed with the patient.   DESCRIPTION OF PROCEDURE:  Informed written consent was obtained.  A timeout was performed. The patient required no sedation for the procedure today. The patients left chest was prepped and draped in the usual sterile fashion. The skin overlying the left parasternal region was infiltrated with lidocaine for local analgesia.  A 0.5-cm incision was made over the left parasternal region over the 3rd intercostal space.  A subcutaneous ILR pocket was fashioned using a combination of sharp and blunt dissection.  A Medtronic Reveal LINQ implantable loop recorder [id number]) was then placed into the pocket  R waves were very prominent and measured >0.52m.  Steri- Strips and a sterile dressing were then applied.  There were no early apparent complications.   CONCLUSIONS:  1. Successful implantation of a implantable loop recorder for a history of cryptogenic stroke  2. No early apparent complications. AMelida Quitter MD Cardiac Electrophysiology   Overnight EEG with video  Result Date:  11/15/2022 YLora Havens MD     11/15/2022  7:44 PM Patient Name: Damon FARRANDMRN: [id number] Attending: PLora HavensReferring Physician/Provider: XRosalin Hawking MD Duration: 11/14/2022 2314 to 11/15/2022 1125 Patient history: 89yoM with left hand shaking jerking spasm for the last 269m. EEG to evaluate for seizure Level of alertness: Awake, asleep AEDs during EEG study: LEV Technical aspects: This EEG study was done with scalp electrodes positioned according to the 10-20 International system of electrode placement. Electrical activity was reviewed with band pass filter of 1-'70Hz'$ , sensitivity of 7 uV/mm, display speed of 304mec with a '60Hz'$  notched filter applied as appropriate. EEG data were recorded continuously and digitally stored.  Video monitoring was available and reviewed as appropriate. Description: The posterior dominant rhythm consists of 9-10 Hz activity of moderate voltage (25-35 uV) seen predominantly in posterior head regions, symmetric and reactive to eye opening and eye closing. Sleep was characterized by vertex waves, sleep spindles (12 to 14 Hz), maximal frontocentral region. EEG showed intermittent 3 to 6 Hz theta-delta slowing in right hemisphere. Generalized low amplitude 12-'15hz'$  beta activity was also noted. Hyperventilation and photic stimulation were not performed.   ABNORMALITY - Intermittent slow, right hemisphere IMPRESSION: This study is suggestive of cortical dysfunction arising from right hemisphere likely secondary to underlying structural abnormality. No seizures or epileptiform discharges were seen throughout the recording. Please note lack of epileptiform activity during interictal EEG does not exclude the diagnosis of epilepsy. PriFort HallBasic Metabolic Panel:    Latest Ref Rng & Units 11/18/2022    6:58 AM 11/16/2022    6:06 AM 11/13/2022    6:29 PM  BMP  Glucose 70 -  99 mg/dL 101  110  125   BUN 8 - 23 mg/dL '15  15  22   '$ Creatinine  0.61 - 1.24 mg/dL 0.95  1.07  1.00   Sodium 135 - 145 mmol/L 130  133  132   Potassium 3.5 - 5.1 mmol/L 3.7  4.0  4.3   Chloride 98 - 111 mmol/L 101  101  100   CO2 22 - 32 mmol/L 22  23    Calcium 8.9 - 10.3 mg/dL 8.7  8.9       CBC:    Latest Ref Rng & Units 11/18/2022    6:58 AM 11/16/2022    6:06 AM 11/13/2022    6:29 PM  CBC  WBC 4.0 - 10.5 K/uL 7.2  8.2    Hemoglobin 13.0 - 17.0 g/dL 14.7  14.0  15.3   Hematocrit 39.0 - 52.0 % 42.9  40.3  45.0   Platelets 150 - 400 K/uL 211  211      Brief HPI:   Damon Parrish is a 82 y.o. male  presented to [facility] ER on 11/13/2022 onset of left-sided weakness. Code stroke was initiated.  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  He awoke on the morning of presentation in his normal state of health.  He went back to sleep and woke up approximately 4 hours later and noticed he was clumsy in his left hand and left leg was weak and shaky.  He is maintained on aspirin 325 mg.  He was out of the window for tenecteplase.  Imaging revealed chronic relatively stable subdural low-attenuation collection.  CTA of head and neck with no LVO.  MRI of the brain revealed small area of acute or early subacute ischemia within the right corona radiata.   He has history of both strokes and seizures and was seen in April 2017 at [facility] with patchy acute ischemia of the right frontoparietal lobes in the MCA territory infarct.  He recovered well and was followed up with neurology in 2018.  He developed seizures at this same timeframe and was put on Keppra and titrated off.  However in 2020 he was seen again for abnormal left lower extremity movements that were thought to be focal seizures and Keppra was resumed at 500 mg twice daily.  He is now on aspirin and Plavix and recommend duration for 3 weeks then Plavix alone.  As part of his  workup, which included CTA of the neck, an 11 mm nodule in the medial right upper lobe was noted.  He underwent CT scan of the chest with recommendations to follow-up as an outpatient with repeat CT scan of chest in 3 months with or without PET scanning or tissue sampling.  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  He has a history of hypertension maintained on Norvasc and this is held to allow permissive hypertension.  He takes Lipitor 40 mg daily for hyperlipidemia and is now on high intensity therapy of 80 mg daily. BLE venous duplex negative for DVT. He will have loop recorder placed today.  He requires +2 min to mod assist for functional mobility.   Hospital Course: REMBERT NAUGLE was admitted to rehab 11/15/2022 for inpatient therapies to consist of PT, ST and OT at least three hours five days a  week. Past admission physiatrist, therapy team and rehab RN have worked together to provide customized collaborative inpatient rehab. Attention turned at improveng sleep pattern as well as constipation. Otherwise without complaints and progressing.  Blood pressures were monitored on TID basis and remained in good control off of his home Norvasc.   Rehab course: During patient's stay in rehab weekly team conferences were held to monitor patient's progress, set goals and discuss barriers to discharge. At admission, patient required min assist with mobility and with basic self-care skills.  He has had improvement in activity tolerance, balance, postural control as well as ability to compensate for deficits. He has had improvement in functional use RUE/LUE  and RLE/LLE as well as improvement in awareness  Discharge disposition: 01-Home or Self Care     Diet: heart healthy  Special Instructions: No driving, alcohol consumption or tobacco use.  Right upper lobe pulmonary nodule: needs follow-up/CT chest in three months   Aspirin '81mg'$  QD and  Plavix '75mg'$  QD for three weeks followed by Plavix alone (stop aspirin 3/14)   Do not restart amlodipine until follow-up with PCP/neurology. Check BP at least once daily and record.  Discharge Instructions     Ambulatory referral to Physical Medicine Rehab   Complete by: As directed    Hospital follow-up   Discharge patient   Complete by: As directed    Discharge disposition: 01-Home or Self Care   Discharge patient date: 11/21/2022      Allergies as of 11/21/2022       Reactions   Cephalosporins Rash   Full body rash with systemic symptoms   Vancomycin Rash   Full body rash with systemic symptoms        Medication List     TAKE these medications    acetaminophen 325 MG tablet Commonly known as: TYLENOL Take 1-2 tablets (325-650 mg total) by mouth every 4 (four) hours as needed for mild pain.   aspirin EC 81 MG tablet Take 1 tablet (81 mg total) by mouth daily for 15 days. Swallow whole.   atorvastatin 80 MG tablet Commonly known as: LIPITOR Take 1 tablet (80 mg total) by mouth at bedtime.   bimatoprost 0.03 % ophthalmic solution Commonly known as: LUMIGAN Place 1 drop into the right eye at bedtime.   clopidogrel 75 MG tablet Commonly known as: PLAVIX Take 1 tablet (75 mg total) by mouth daily.   finasteride 5 MG tablet Commonly known as: PROSCAR Take 1 tablet (5 mg total) by mouth daily.   levETIRAcetam 500 MG tablet Commonly known as: KEPPRA Take 1 tablet (500 mg total) by mouth 2 (two) times daily.   multivitamin capsule Take 1 capsule by mouth daily.   prednisoLONE acetate 1 % ophthalmic suspension Commonly known as: PRED FORTE Place 1 drop into the left eye daily.   senna-docusate 8.6-50 MG tablet Commonly known as: Senokot-S Take 2 tablets by mouth at bedtime as needed for mild constipation.   Simbrinza 1-0.2 % Susp Generic drug: Brinzolamide-Brimonidine Place 1 drop into both eyes 2 (two) times daily.   tamsulosin 0.4 MG Caps  capsule Commonly known as: FLOMAX Take 1 capsule (0.4 mg total) by mouth daily after supper.   timolol 0.25 % ophthalmic solution Commonly known as: TIMOPTIC Place 1 drop into the right eye daily.   Trelegy Ellipta 100-62.5-25 MCG/ACT Aepb Generic drug: Fluticasone-Umeclidin-Vilant Inhale 1 puff into the lungs daily.   vitamin C 1000 MG tablet Take 1,000 mg by mouth daily.   Vitamin  D-1000 Max St 25 MCG (1000 UT) tablet Generic drug: Cholecalciferol Take 1 tablet by mouth daily.   vitamin E 180 MG (400 UNITS) capsule Take 400 Units by mouth daily.        Follow-up Information     Pradhan, Tilden Fossa, MD Follow up.   Specialty: Internal Medicine Why: Call the office in 1-2 days to make arrangements for hospital follow-up appointment. Contact information: [place] [address] [phone]         Cameron Sprang, MD. Go to.   Specialty: Neurology Contact information: [place] [address] [phone]         Charlett Blake, MD Follow up.   Specialty: Physical Medicine and Rehabilitation Why: office will call you to arrange your appt (sent) Contact information: [place] [address] [phone]                 Signed: Barbie Banner 11/27/2022, 9:26 AM

## 2022-11-20 NOTE — Progress Notes (Signed)
Occupational Therapy Discharge Summary  Patient Details  Name: Damon Parrish MRN: [id number] Date of Birth: 1941/08/19  Date of Discharge from OT service:November 21, 2022  Today's Date: 11/20/2022 OT Individual Time: 1310-1418 OT Individual Time Calculation (min): 68 min    Patient has met 10 of 10 long term goals due to improved activity tolerance, improved balance, postural control, ability to compensate for deficits, functional use of  LEFT upper and LEFT lower extremity, improved attention, improved awareness, and improved coordination.  Patient to discharge at overall Supervision level.  Patient's care partner is independent to provide the necessary physical and cognitive assistance at discharge.    Reasons goals not met: NA  Recommendation:  Patient will benefit from ongoing skilled OT services in outpatient setting to continue to advance functional skills in the area of BADL, iADL, and Reduce care partner burden.  Equipment: No equipment provided  Reasons for discharge: treatment goals met and discharge from hospital  Patient/family agrees with progress made and goals achieved: Yes  OT Discharge Skilled Therapeutic Interventions/Progress Updates:  Pt received reclined in bed presenting to be in good spirits and receptive to skilled OT session with a focus on d/c planning and endurance/strength training to increase participation in BADLs/IADLs. 0/10 pain reported during session. Pt requesting to brush teeth at beginning of session standing at sink without AD supervision completing task without Vb cueing provided. Pt ambulated to therapy gym for functional mobility and endurance using SPC with close supervision with min VB cues required for attention to L side to avoid obstacles with rest break provided following. Pt provided printout of home UE exercises to increase/maintain strength upon d/c. Pt followed pictures on handout to complete exercises using red (moderate resistance)  therband with demonstration also provided. Skilled feedback, tactile, and visual cues provided throughout exercises to increase Pt body awareness, muscle activation, trunk alignment, and use of breath during movement. Pt verbalized understanding and demonstrated teach back when completing exercises as evidence of learning. Pt then completed 6 minutes on NuStep at level 3 resistance for UB strength and endurance training and to facilitate reciprocal U/LE coordination to improve coordination for functional mobility. Pt reeducated on PLB'ing ans utilized during task following Vb cueing. Rest break provided following. Pt completed side steps 2x10 R and L without AD to simulate walking into narrow shower at home with close supervision provided. Pt ambulated back to room using RW at end of session and was left resting in bed with call bell in reach, bed alarm on, and all needs met.   Pt transitioned  Precautions/Restrictions  Precautions Precautions: Fall;Other (comment) Precaution Comments: L hemi, mild impulsivity, monitor HR, hx of seizures, loop recorder Restrictions Weight Bearing Restrictions: No Other Position/Activity Restrictions: resting R tremor at baseline  Pain Pain Assessment Pain Scale: 0-10 Pain Score: 0-No pain ADL ADL Eating: Independent Where Assessed-Eating: Edge of bed Grooming: Supervision/safety Where Assessed-Grooming: Standing at sink Upper Body Bathing: Supervision/safety Where Assessed-Upper Body Bathing: Shower Lower Body Bathing: Supervision/safety Where Assessed-Lower Body Bathing: Shower Upper Body Dressing: Supervision/safety Where Assessed-Upper Body Dressing: Edge of bed Lower Body Dressing: Supervision/safety Where Assessed-Lower Body Dressing: Edge of bed Toileting: Supervision/safety Where Assessed-Toileting: Glass blower/designer: Psychiatric nurse Method: Counselling psychologist: Energy manager: Not  assessed Social research officer, government: Close supervision Social research officer, government Method: Heritage manager: Civil engineer, contracting with back Vision Baseline Vision/History: 1 Wears glasses;3 Glaucoma (schdeuled to have eye surgery before CVA event) Patient Visual Report: No change from  baseline Vision Assessment?: Yes Eye Alignment: Within Functional Limits Ocular Range of Motion: Within Functional Limits Alignment/Gaze Preference: Within Defined Limits Tracking/Visual Pursuits: Able to track stimulus in all quads without difficulty Saccades: Within functional limits Convergence: Within functional limits Visual Fields: No apparent deficits Depth Perception: Undershoots Perception  Perception: Impaired Inattention/Neglect: Does not attend to left visual field (decreased attention) Praxis Praxis: Impaired Praxis Impairment Details: Motor planning;Ideomotor Praxis-Other Comments: Improvement from evaluation Cognition Cognition Overall Cognitive Status: Impaired/Different from baseline (per wife report, Pt cognition different from baseline with impairements in attention and awareness) Arousal/Alertness: Awake/alert Orientation Level: Person;Place;Situation Person: Oriented Place: Oriented Situation: Oriented Memory: Impaired Memory Impairment: Storage deficit;Retrieval deficit Attention: Focused;Sustained;Selective;Alternating Focused Attention: Appears intact Sustained Attention: Appears intact Selective Attention: Appears intact Awareness: Impaired Awareness Impairment: Anticipatory impairment Problem Solving: Impaired Problem Solving Impairment: Functional complex;Verbal complex Executive Function: Reasoning;Decision Making Reasoning: Impaired Reasoning Impairment: Functional basic Decision Making: Impaired Decision Making Impairment: Verbal complex;Functional basic Safety/Judgment: Impaired Brief Interview for Mental Status (BIMS) Repetition of Three Words (First  Attempt): 3 Temporal Orientation: Year: Correct Temporal Orientation: Month: Accurate within 5 days Temporal Orientation: Day: Incorrect Recall: "Sock": Yes, no cue required Recall: "Blue": Yes, no cue required Recall: "Bed": Yes, after cueing ("a piece of furniture") BIMS Summary Score: 13 Sensation Sensation Light Touch: Appears Intact Hot/Cold: Not tested Proprioception: Appears Intact Stereognosis: Not tested Coordination Gross Motor Movements are Fluid and Coordinated: No Fine Motor Movements are Fluid and Coordinated: No Coordination and Movement Description: mild L hemiparesis Finger Nose Finger Test: Mild undershooting and dysmetria on L side, improvement from evaluation Motor  Motor Motor: Hemiplegia;Motor apraxia Motor - Discharge Observations: mild hemiparesis LLE>UE, improvement from evaluaiton Mobility  Bed Mobility Bed Mobility: Supine to Sit;Sit to Supine Rolling Right: Supervision/verbal cueing Rolling Left: Supervision/Verbal cueing Supine to Sit: Supervision/Verbal cueing Sit to Supine: Supervision/Verbal cueing Transfers Sit to Stand: Supervision/Verbal cueing Stand to Sit: Supervision/Verbal cueing  Trunk/Postural Assessment  Cervical Assessment Cervical Assessment: Exceptions to [facility] (forward head) Thoracic Assessment Thoracic Assessment: Exceptions to [facility] (rounded shoulders) Lumbar Assessment Lumbar Assessment: Exceptions to [facility] Postural Control Postural Control: Deficits on evaluation Righting Reactions: decreased Protective Responses: delayed, improved since evaluation  Balance Balance Balance Assessed: Yes Static Sitting Balance Static Sitting - Balance Support: Feet supported Static Sitting - Level of Assistance: 7: Independent Dynamic Sitting Balance Dynamic Sitting - Balance Support: Feet supported Dynamic Sitting - Level of Assistance: 7: Independent Static Standing Balance Static Standing - Balance Support: During functional  activity Static Standing - Level of Assistance: 5: Stand by assistance (supervision) Dynamic Standing Balance Dynamic Standing - Balance Support: During functional activity Dynamic Standing - Level of Assistance: 5: Stand by assistance (supervision) Dynamic Standing - Balance Activities:  (ADLs) Extremity/Trunk Assessment RUE Assessment RUE Assessment: Within Functional Limits Passive Range of Motion (PROM) Comments: WFL Active Range of Motion (AROM) Comments: WFL General Strength Comments: WFL LUE Assessment LUE Assessment: Exceptions to [facility] Passive Range of Motion (PROM) Comments: WFL Active Range of Motion (AROM) Comments: [facility] General Strength Comments: 4/5   Hideo Googe A Malesha Suliman 11/20/2022, 2:01 PM

## 2022-11-20 NOTE — Progress Notes (Signed)
PROGRESS NOTE   Subjective/Complaints:    ROS: Denies fevers, chills, CP, SOB, abd pain, N/V/D,  Objective:   No results found. Recent Labs    11/18/22 0658  WBC 7.2  HGB 14.7  HCT 42.9  PLT 211    Recent Labs    11/18/22 0658  NA 130*  K 3.7  CL 101  CO2 22  GLUCOSE 101*  BUN 15  CREATININE 0.95  CALCIUM 8.7*     Intake/Output Summary (Last 24 hours) at 11/20/2022 0854 Last data filed at 11/20/2022 0815 Gross per 24 hour  Intake 1230 ml  Output 1175 ml  Net 55 ml         Physical Exam: Vital Signs Blood pressure (!) 148/68, pulse 64, temperature 98 F (36.7 C), resp. rate 17, height '5\' 9"'$  (1.753 m), weight 81.8 kg, SpO2 94 %.   General: No acute distress Mood and affect are appropriate Heart: Regular rate and rhythm no rubs murmurs or extra sounds Lungs: Clear to auscultation, breathing unlabored, no rales or wheezes Abdomen: Positive bowel sounds, soft nontender to palpation, nondistended Extremities: No clubbing, cyanosis, or edema Skin: No evidence of breakdown, no evidence of rash Neurologic: Cranial nerves II through XII intact, motor strength is 5/5 in bilateral deltoid, bicep, tricep, grip, hip flexor, knee extensors, ankle dorsiflexor and plantar flexor    PRIOR EXAM: MSK:      No apparent deformity.      Strength:                5/5 RUE and RLE, 4+ LUE and 5/5 LLE   Neurologic exam:  Cognition: AAO to person, place, time and event.  Language: Fluent, No substitutions or neoglisms. No dysarthria. Insight: Good insight into current condition.  Mood: Pleasant affect, appropriate mood.  Sensation: To light touch intact in BL UEs and LEs   CN: 2-12 grossly intact.  Coordination: No ataxia Spasticity: MAS 0 in all extremities.   Assessment/Plan: 1. Functional deficits which require 3+ hours per day of interdisciplinary therapy in a comprehensive inpatient rehab  setting. Physiatrist is providing close team supervision and 24 hour management of active medical problems listed below. Physiatrist and rehab team continue to assess barriers to discharge/monitor patient progress toward functional and medical goals  Care Tool:  Bathing    Body parts bathed by patient: Right arm, Left arm, Chest, Abdomen, Front perineal area, Buttocks, Right upper leg, Left upper leg, Right lower leg, Left lower leg, Face         Bathing assist Assist Level: Supervision/Verbal cueing     Upper Body Dressing/Undressing Upper body dressing   What is the patient wearing?: Button up shirt    Upper body assist Assist Level: Supervision/Verbal cueing    Lower Body Dressing/Undressing Lower body dressing      What is the patient wearing?: Underwear/pull up, Pants     Lower body assist Assist for lower body dressing: Minimal Assistance - Patient > 75%     Toileting Toileting    Toileting assist Assist for toileting: Supervision/Verbal cueing     Transfers Chair/bed transfer  Transfers assist     Chair/bed transfer assist level: Contact  Guard/Touching assist     Locomotion Ambulation   Ambulation assist      Assist level: Minimal Assistance - Patient > 75% Assistive device: No Device Max distance: >165f   Walk 10 feet activity   Assist     Assist level: Contact Guard/Touching assist Assistive device: No Device   Walk 50 feet activity   Assist    Assist level: Contact Guard/Touching assist Assistive device: No Device    Walk 150 feet activity   Assist Walk 150 feet activity did not occur: Safety/medical concerns  Assist level: Minimal Assistance - Patient > 75%      Walk 10 feet on uneven surface  activity   Assist     Assist level: Minimal Assistance - Patient > 75% Assistive device: Other (comment) (railing)   Wheelchair     Assist Is the patient using a wheelchair?: Yes Type of Wheelchair: Manual (for  transport)    Wheelchair assist level: Dependent - Patient 0%      Wheelchair 50 feet with 2 turns activity    Assist        Assist Level: Dependent - Patient 0%   Wheelchair 150 feet activity     Assist      Assist Level: Dependent - Patient 0%   Blood pressure (!) 148/68, pulse 64, temperature 98 F (36.7 C), resp. rate 17, height '5\' 9"'$  (1.753 m), weight 81.8 kg, SpO2 94 %.  Medical Problem List and Plan: 1. 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 Formulized individual treatment plan based on medical history, underlying problem and comorbidities.    2.  Antithrombotics: -DVT/anticoagulation:  Pharmaceutical: Lovenox '40mg'$  QD although pt is amb well at this point would cont lovenox due to lung nodule which may cause a hypercoagulable state   -11/16/22 Dopplers negative on 11/14/22 -antiplatelet therapy: aspirin '81mg'$  QD and Plavix '75mg'$  QD for three weeks followed by Plavix alone (stop aspirin 3/14)   3. Pain Management: Tylenol as needed, Robaxin '500mg'$  q6h PRN   4. Mood/Behavior/Sleep: LCSW to evaluate and provide emotional support             -antipsychotic agents: n/a  -Trazodone PRN   5. Neuropsych/cognition: This patient is capable of making decisions on his own behalf.   6. Skin/Wound Care: Routine skin care checks   7. Fluids/Electrolytes/Nutrition: Routine Is and Os and follow-up chemistries next 11/18/22   8: Hypertension: monitor TID and prn (home Norvasc '10mg'$  QD held)  -11/19/22 BP stable, monitor for now   Vitals:   11/16/22 1428 11/16/22 1922 11/17/22 0445 11/17/22 1435  BP: (!) 144/78 139/80 128/74 (!) 157/73   11/17/22 1936 11/18/22 0442 11/18/22 1243 11/18/22 1912  BP: (!)  149/94 (!) 140/78 128/80 (!) 144/88   11/19/22 0531 11/19/22 1339 11/19/22 2009 11/20/22 0514  BP: 137/88 131/76 (!) 148/84 (!) 148/68      9: Hyperlipidemia: continue statin: now on high-intensity>>Lipitor 80 mg daily   10: BPH: continue Proscar 5 mg daily (home Flomax 0.'4mg'$  QD not restarted)   11: Prior history of stroke with seizure activity: follows with Dr. ADelice Lesch            -  Keppra 500 mg BID -11/16/22 EEG overnight 11/15/22 suggestive of cortical dysfunction arising from right hemisphere likely secondary to underlying structural abnormality. No seizures or epileptiform discharges were seen throughout the recording.   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 Cont regimen. Monitor for oversoftening LBM 2/27, incont  17: Tobacco use: cessation counseling; declines nicotine patch    LOS: 5 days A FACE TO FACE EVALUATION WAS PERFORMED  ACharlett Blake2/28/2024, 8:54 AM

## 2022-11-21 MED ORDER — SENNOSIDES-DOCUSATE SODIUM 8.6-50 MG PO TABS: 2.0000 | ORAL_TABLET | Freq: Every evening | ORAL | Status: AC | PRN

## 2022-11-21 MED ORDER — FINASTERIDE 5 MG PO TABS: 5.0000 mg | ORAL_TABLET | Freq: Every day | ORAL | 0 refills | Status: DC

## 2022-11-21 MED ORDER — CLOPIDOGREL BISULFATE 75 MG PO TABS: 75.0000 mg | ORAL_TABLET | Freq: Every day | ORAL | 0 refills | Status: DC

## 2022-11-21 MED ORDER — ASPIRIN 81 MG PO TBEC: 81.0000 mg | DELAYED_RELEASE_TABLET | Freq: Every day | ORAL | 0 refills | Status: AC

## 2022-11-21 MED ORDER — LEVETIRACETAM 500 MG PO TABS: 500.0000 mg | ORAL_TABLET | Freq: Two times a day (BID) | ORAL | 0 refills | Status: DC

## 2022-11-21 MED ORDER — ACETAMINOPHEN 325 MG PO TABS: 325.0000 mg | ORAL_TABLET | ORAL | Status: DC | PRN

## 2022-11-21 MED ORDER — ATORVASTATIN CALCIUM 80 MG PO TABS: 80.0000 mg | ORAL_TABLET | Freq: Every day | ORAL | 0 refills | Status: DC

## 2022-11-21 NOTE — Progress Notes (Signed)
Patient ID: Damon Parrish, male   DOB: [birth year], 83 y.o.   MRN: [id number]  OP referral faxed to [facility]

## 2022-11-21 NOTE — Progress Notes (Signed)
PROGRESS NOTE   Subjective/Complaints:  Excited about d/c  ROS: Denies fevers, chills, CP, SOB, abd pain, N/V/D,  Objective:   No results found. No results for input(s): "WBC", "HGB", "HCT", "PLT" in the last 72 hours.  No results for input(s): "NA", "K", "CL", "CO2", "GLUCOSE", "BUN", "CREATININE", "CALCIUM" in the last 72 hours.   Intake/Output Summary (Last 24 hours) at 11/21/2022 0805 Last data filed at 11/20/2022 1900 Gross per 24 hour  Intake 810 ml  Output --  Net 810 ml         Physical Exam: Vital Signs Blood pressure 138/70, pulse (!) 57, temperature 97.6 F (36.4 C), resp. rate 17, height '5\' 9"'$  (1.753 m), weight 81.8 kg, SpO2 96 %.   General: No acute distress Mood and affect are appropriate Heart: Regular rate and rhythm no rubs murmurs or extra sounds Lungs: Clear to auscultation, breathing unlabored, no rales or wheezes Abdomen: Positive bowel sounds, soft nontender to palpation, nondistended Extremities: No clubbing, cyanosis, or edema Skin: No evidence of breakdown, no evidence of rash Neurologic: Cranial nerves II through XII intact, motor strength is 5/5 in bilateral deltoid, bicep, tricep, grip, hip flexor, knee extensors, ankle dorsiflexor and plantar flexor    PRIOR EXAM: MSK:      No apparent deformity.      Strength:                5/5 RUE and RLE, 4+ LUE and 5/5 LLE   Neurologic exam:  Cognition: AAO to person, place, time and event.  Language: Fluent, No substitutions or neoglisms. No dysarthria. Insight: Good insight into current condition.  Mood: Pleasant affect, appropriate mood.   Coordination: some difficulty maintaining L SLR when supine Spasticity: MAS 0 in all extremities.   Assessment/Plan: 1. Functional deficits which require 3+ hours per day of interdisciplinary therapy in a comprehensive inpatient rehab setting. Physiatrist is providing close team supervision and  24 hour management of active medical problems listed below. Physiatrist and rehab team continue to assess barriers to discharge/monitor patient progress toward functional and medical goals  Care Tool:  Bathing    Body parts bathed by patient: Right arm, Left arm, Chest, Abdomen, Front perineal area, Buttocks, Right upper leg, Left upper leg, Right lower leg, Left lower leg, Face         Bathing assist Assist Level: Supervision/Verbal cueing     Upper Body Dressing/Undressing Upper body dressing   What is the patient wearing?: Button up shirt    Upper body assist Assist Level: Supervision/Verbal cueing    Lower Body Dressing/Undressing Lower body dressing      What is the patient wearing?: Underwear/pull up, Pants     Lower body assist Assist for lower body dressing: Supervision/Verbal cueing     Toileting Toileting    Toileting assist Assist for toileting: Supervision/Verbal cueing     Transfers Chair/bed transfer  Transfers assist     Chair/bed transfer assist level: Supervision/Verbal cueing Chair/bed transfer assistive device: Programmer, multimedia   Ambulation assist      Assist level: Supervision/Verbal cueing Assistive device: Walker-rolling Max distance: >119f   Walk 10 feet activity   Assist  Assist level: Supervision/Verbal cueing Assistive device: Walker-rolling   Walk 50 feet activity   Assist    Assist level: Supervision/Verbal cueing Assistive device: Walker-rolling    Walk 150 feet activity   Assist Walk 150 feet activity did not occur: Safety/medical concerns  Assist level: Supervision/Verbal cueing Assistive device: Walker-rolling    Walk 10 feet on uneven surface  activity   Assist     Assist level: Supervision/Verbal cueing Assistive device: Walker-rolling   Wheelchair     Assist Is the patient using a wheelchair?: No Type of Wheelchair: Manual (for transport)    Wheelchair assist  level: Dependent - Patient 0%      Wheelchair 50 feet with 2 turns activity    Assist        Assist Level: Dependent - Patient 0%   Wheelchair 150 feet activity     Assist      Assist Level: Dependent - Patient 0%   Blood pressure 138/70, pulse (!) 57, temperature 97.6 F (36.4 C), resp. rate 17, height '5\' 9"'$  (1.753 m), weight 81.8 kg, SpO2 96 %.  Medical Problem List and Plan: 1. 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  Antithrombotics: -DVT/anticoagulation:  Pharmaceutical: Lovenox '40mg'$  QD although pt is amb well at this point would cont lovenox due to lung nodule which may cause a hypercoagulable state   -11/16/22 Dopplers negative on 11/14/22 -antiplatelet therapy: aspirin '81mg'$  QD and Plavix '75mg'$  QD for three weeks followed by Plavix alone (stop aspirin 3/14)   3. Pain Management: Tylenol as needed, Robaxin '500mg'$  q6h PRN   4. Mood/Behavior/Sleep: LCSW to evaluate and provide emotional support             -antipsychotic agents: n/a  -Trazodone PRN   5. Neuropsych/cognition: This patient is capable of making decisions on his own behalf.   6. Skin/Wound Care: Routine skin care checks   7. Fluids/Electrolytes/Nutrition: Routine Is and Os and follow-up chemistries next 11/18/22   8: Hypertension: monitor TID and prn (home Norvasc '10mg'$  QD held)  -11/19/22 BP stable, monitor for now   Vitals:   11/17/22 1435 11/17/22 1936 11/18/22 0442 11/18/22 1243  BP: (!) 157/73 (!) 149/94 (!) 140/78 128/80   11/18/22 1912 11/19/22 0531 11/19/22 1339 11/19/22 2009  BP: (!) 144/88 137/88 131/76 (!) 148/84   11/20/22 0514 11/20/22 1422 11/20/22 2020 11/21/22 0452  BP: (!) 148/68 (!) 146/78 114/88 138/70   F/u PCP fo rBP check may need to resume amlodipine    9: Hyperlipidemia: continue statin: now on high-intensity>>Lipitor 80 mg daily    10: BPH: continue Proscar 5 mg daily (home Flomax 0.'4mg'$  QD not restarted)   11: Prior history of stroke with seizure activity: follows with Dr. Delice Lesch             -Keppra 500 mg BID -11/16/22 EEG overnight 11/15/22 suggestive of cortical dysfunction arising from right hemisphere likely secondary to underlying structural abnormality. No seizures or epileptiform discharges were seen throughout the recording.   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 Cont regimen. Monitor for oversoftening LBM 2/27, incont  17: Tobacco use: cessation counseling; declines nicotine patch    LOS: 6 days A FACE TO FACE EVALUATION WAS PERFORMED  ACharlett Blake2/29/2024, 8:05 AM

## 2022-11-22 ENCOUNTER — Encounter (HOSPITAL_COMMUNITY): Payer: Self-pay | Admitting: Occupational Therapy

## 2022-11-22 ENCOUNTER — Other Ambulatory Visit: Payer: Self-pay

## 2022-11-22 ENCOUNTER — Ambulatory Visit (HOSPITAL_COMMUNITY): Payer: Medicare Other | Attending: Physician Assistant | Admitting: Occupational Therapy

## 2022-11-22 DIAGNOSIS — R278 Other lack of coordination: Secondary | ICD-10-CM | POA: Insufficient documentation

## 2022-11-22 DIAGNOSIS — R29818 Other symptoms and signs involving the nervous system: Secondary | ICD-10-CM | POA: Diagnosis present

## 2022-11-22 DIAGNOSIS — M6281 Muscle weakness (generalized): Secondary | ICD-10-CM | POA: Insufficient documentation

## 2022-11-22 DIAGNOSIS — R41841 Cognitive communication deficit: Secondary | ICD-10-CM | POA: Insufficient documentation

## 2022-11-22 DIAGNOSIS — R262 Difficulty in walking, not elsewhere classified: Secondary | ICD-10-CM | POA: Diagnosis present

## 2022-11-22 NOTE — Patient Instructions (Signed)

## 2022-11-22 NOTE — Therapy (Signed)
OUTPATIENT OCCUPATIONAL THERAPY NEURO EVALUATION  Patient Name: Damon Parrish MRN: [id number] DOB:1940/12/20, 82 y.o., male Today's Date: 11/22/2022  PCP: Ralph Leyden, MD REFERRING PROVIDER: Lauraine Rinne, PA-C  END OF SESSION:  OT End of Session - 11/22/22 1420     Visit Number 1    Number of Visits 1    Date for OT Re-Evaluation 11/25/22    Authorization Type Medicare A & B    Progress Note Due on Visit 10    OT Start Time 1346    OT Stop Time 1410    OT Time Calculation (min) 24 min    Activity Tolerance Patient tolerated treatment well    Behavior During Therapy WFL for tasks assessed/performed             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   GERD (gastroesophageal reflux disease)    H/O cerebral aneurysm repair 2016   "put stent in"   High cholesterol    History of blood transfusion ~ 1950   "while in hospital w/pneumonia" (01/09/2018)   History of kidney stones    Hypertension    Nonruptured cerebral aneurysm    Pneumonia ~ 1950   Red man syndrome    Seizures ([place]) 12/27/2015; 12/28/2015   Stroke ([place]) 12/2015   denies residual on 01/09/2018)   Past Surgical History:  Procedure Laterality Date   CATARACT EXTRACTION W/ INTRAOCULAR LENS  IMPLANT, BILATERAL Bilateral    COLONOSCOPY     2013 per patient: done in [place], normal, next colonoscopy due in 10 years   FOOT FRACTURE SURGERY Left    "pins, etc. in there"   FRACTURE SURGERY     LOOP RECORDER INSERTION N/A 11/15/2022   Procedure: LOOP RECORDER INSERTION;  Surgeon: Melida Quitter, MD;  Location: [facility];  Service: Cardiovascular;  Laterality: N/A;   RADIOLOGY WITH ANESTHESIA N/A 05/04/2015   Procedure: Pipeline Embolization;  Surgeon: Consuella Lose, MD;  Location: [facility];  Service: Radiology;  Laterality: N/A;    RADIOLOGY WITH ANESTHESIA N/A 05/31/2015   Procedure: Pipeline Embolization;  Surgeon: Consuella Lose, MD;  Location: [facility];  Service: Radiology;  Laterality: N/A;   Patient Active Problem List   Diagnosis Date Noted   CVA (cerebral vascular accident) ([place]) 11/14/2022   Seizure disorder ([place]) 11/13/2022   Pulmonary nodule 11/13/2022   Cough 11/13/2022   Left leg numbness    Muscle fasciculation    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[place]) 01/07/2019   Allergic reaction    DRESS syndrome    Pressure injury of skin 01/09/2018   Adverse drug reaction 01/08/2018   Dermatitis, drug-induced 01/08/2018   Discitis 12/17/2017   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   Rectal bleeding    Constipation 12/16/2017   Cerebrovascular accident (CVA) due to embolism of right middle cerebral artery ([place]) 03/06/2016   S/P cerebral aneurysm repair 03/06/2016   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   Palpitations 01/05/2016   Aneurysm, cerebral, nonruptured    Hyperlipidemia    Acute CVA (cerebrovascular accident) ([place]) 12/30/2015   Acute right MCA stroke ([place])    Focal seizure ([place]) 12/28/2015   HTN (hypertension) 12/28/2015   Hyponatremia 12/28/2015   Cerebral aneurysm 05/04/2015    ONSET DATE: 11/13/22  REFERRING DIAG: s/p right CVA  THERAPY DIAG:  Other symptoms and signs involving the nervous system  Other lack of coordination  Rationale for Evaluation and Treatment: Rehabilitation  SUBJECTIVE:   SUBJECTIVE STATEMENT: S: It's still not normal.  Pt accompanied by: self  PERTINENT HISTORY: Pt is an 82 y/o male s/p right CVA on 11/13/22, stayed at [facility] for approximately 7 days, was released on 11/21/22. Pt presents with wife, reports he is not having difficulty with any tasks just fatigues easier.   PRECAUTIONS: None  WEIGHT BEARING RESTRICTIONS: No  PAIN:  Are you having pain? No  FALLS: Has patient fallen in last 6 months? Yes. Number of falls 1  LIVING  ENVIRONMENT: Lives with: lives with their spouse Lives in: House/apartment Stairs: Yes: External: 1 steps; none Has following equipment at home: Single point cane, Walker - 2 wheeled, Wheelchair (manual), and shower chair  PLOF: Independent  PATIENT GOALS: To be able to use the LUE normally.   OBJECTIVE:   HAND DOMINANCE: Ambidextrous  ADLs: Overall ADLs: Pt reports no difficulty with ADLs at this time.   ACTIVITY TOLERANCE: Activity tolerance: Decreased, fatigues easily  UPPER EXTREMITY ROM:    A/ROM is WFL throughout LUE  UPPER EXTREMITY MMT:     Assessed seated, er/IR adducted  MMT Left eval  Shoulder flexion 4/5  Shoulder abduction 4+/5  Shoulder internal rotation 5/5  Shoulder external rotation 4+/5  Elbow flexion 5/5  Elbow extension 5/5  Wrist flexion 5/5  Wrist extension 5/5  Wrist ulnar deviation 5/5  Wrist radial deviation 5/5  Wrist pronation 5/5  Wrist supination 5/5  (Blank rows = not tested)  HAND FUNCTION: Grip strength: Right: 80 lbs; Left: 80 lbs, Lateral pinch: Right: 17 lbs, Left: 17 lbs, and 3 point pinch: Right: 15 lbs, Left: 15 lbs  COORDINATION: 9 Hole Peg test: Right: 43.98" sec; Left: 48.36" sec   COGNITION: Overall cognitive status: Within functional limits for tasks assessed  VISION: Subjective report: no changes Baseline vision: Wears glasses all the time Visual history: glaucoma  VISION ASSESSMENT: WFL   TODAY'S TREATMENT:                                                                                                                              DATE: N/A-eval only   PATIENT EDUCATION: Education details: shoulder strengthening Person educated: Patient and Spouse Education method: Explanation, Demonstration, and Handouts Education comprehension: verbalized understanding and returned demonstration  HOME EXERCISE PROGRAM: Eval: shoulder strengthening   GOALS: Goals reviewed with patient? Yes  SHORT TERM GOALS:  Target date: 11/22/22  Pt will be provided with and educated on HEP to improve strength in LUE required for ADL and outdoor task completion.   Goal status: INITIAL    ASSESSMENT:  CLINICAL IMPRESSION: Patient is a 82 y.o. male who was seen today for occupational therapy evaluation s/p right CVA. Pt reports no difficulty with ADLs, demonstrates LUE ROM and strength WFL. Mild instability of LUE during MMT.  HEP for shoulder strengthening provided with pt performing with good form. Pt and wife agreeable to HEP only, no further OT needs at this time.   PERFORMANCE DEFICITS: in functional skills including ADLs, IADLs, coordination, strength, and UE functional use  IMPAIRMENTS: are limiting patient from ADLs and IADLs.   CO-MORBIDITIES: has no other co-morbidities that affects occupational performance. Patient will benefit from skilled OT to address above impairments and improve overall function.  MODIFICATION OR ASSISTANCE TO COMPLETE EVALUATION: No modification of tasks or assist necessary to complete an evaluation.  OT OCCUPATIONAL PROFILE AND HISTORY: Problem focused assessment: Including review of records relating to presenting problem.  CLINICAL DECISION MAKING: LOW - limited treatment options, no task modification necessary  REHAB POTENTIAL: Excellent  EVALUATION COMPLEXITY: Low    PLAN:  OT FREQUENCY: one time visit  OT DURATION: 1 week  PLANNED INTERVENTIONS: patient/family education  RECOMMENDED OTHER SERVICES: PT  CONSULTED AND AGREED WITH PLAN OF CARE: Patient and family member/caregiver  PLAN FOR NEXT SESSION: N/A-evaluation only today, HEP provided   Guadelupe Sabin, OTR/L  [phone] 11/22/2022, 2:21 PM

## 2022-11-25 ENCOUNTER — Ambulatory Visit (HOSPITAL_COMMUNITY): Payer: Medicare Other

## 2022-11-25 ENCOUNTER — Encounter (HOSPITAL_COMMUNITY): Payer: Self-pay | Admitting: Speech Pathology

## 2022-11-25 ENCOUNTER — Ambulatory Visit (HOSPITAL_COMMUNITY): Payer: Medicare Other | Admitting: Speech Pathology

## 2022-11-25 DIAGNOSIS — R29818 Other symptoms and signs involving the nervous system: Secondary | ICD-10-CM | POA: Diagnosis not present

## 2022-11-25 DIAGNOSIS — R41841 Cognitive communication deficit: Secondary | ICD-10-CM

## 2022-11-25 NOTE — Therapy (Signed)
OUTPATIENT SPEECH LANGUAGE PATHOLOGY EVALUATION   Patient Name: Damon Parrish MRN: [id number] DOB:17-Dec-1940, 82 y.o., male Today's Date: 11/25/2022  PCP: Ralph Leyden, MD REFERRING PROVIDER: Lauraine Rinne, PA-C  END OF SESSION:  End of Session - 11/25/22 1117     Visit Number 1    Number of Visits 1    Authorization Type Medicare    SLP Start Time 1030    SLP Stop Time  1115    SLP Time Calculation (min) 45 min    Activity Tolerance Patient tolerated treatment well             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   GERD (gastroesophageal reflux disease)    H/O cerebral aneurysm repair 2016   "put stent in"   High cholesterol    History of blood transfusion ~ 1950   "while in hospital w/pneumonia" (01/09/2018)   History of kidney stones    Hypertension    Nonruptured cerebral aneurysm    Pneumonia ~ 1950   Red man syndrome    Seizures ([place]) 12/27/2015; 12/28/2015   Stroke ([place]) 12/2015   denies residual on 01/09/2018)   Past Surgical History:  Procedure Laterality Date   CATARACT EXTRACTION W/ INTRAOCULAR LENS  IMPLANT, BILATERAL Bilateral    COLONOSCOPY     2013 per patient: done in [place], normal, next colonoscopy due in 10 years   FOOT FRACTURE SURGERY Left    "pins, etc. in there"   [place] N/A 11/15/2022   Procedure: LOOP RECORDER INSERTION;  Surgeon: Melida Quitter, MD;  Location: [facility] [facility];  Service: Cardiovascular;  Laterality: N/A;   RADIOLOGY WITH ANESTHESIA N/A 05/04/2015   Procedure: Pipeline Embolization;  Surgeon: Consuella Lose, MD;  Location: [facility];  Service: Radiology;  Laterality: N/A;   RADIOLOGY WITH ANESTHESIA N/A 05/31/2015   Procedure: Pipeline Embolization;  Surgeon: Consuella Lose, MD;  Location: [place] [facility];  Service:  Radiology;  Laterality: N/A;   Patient Active Problem List   Diagnosis Date Noted   CVA (cerebral vascular accident) ([place]) 11/14/2022   Seizure disorder ([place]) 11/13/2022   Pulmonary nodule 11/13/2022   Cough 11/13/2022   Left leg numbness    Muscle fasciculation    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[place]) 01/07/2019   Allergic reaction    DRESS syndrome    Pressure injury of skin 01/09/2018   Adverse drug reaction 01/08/2018   Dermatitis, drug-induced 01/08/2018   Discitis 12/17/2017   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   Rectal bleeding    Constipation 12/16/2017   Cerebrovascular accident (CVA) due to embolism of right middle cerebral artery ([place]) 03/06/2016   S/P cerebral aneurysm repair 03/06/2016   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   Palpitations 01/05/2016   Aneurysm, cerebral, nonruptured    Hyperlipidemia    Acute CVA (cerebrovascular accident) ([place]) 12/30/2015   Acute right MCA stroke ([place])    Focal seizure ([place]) 12/28/2015   HTN (hypertension) 12/28/2015   Hyponatremia 12/28/2015   Cerebral aneurysm 05/04/2015    ONSET DATE: 11/13/2022   REFERRING DIAG: CVA  THERAPY DIAG:  Cognitive communication deficit  Rationale for Evaluation and Treatment: Rehabilitation  SUBJECTIVE:   SUBJECTIVE STATEMENT: "I don't think I need speech therapy."  Pt accompanied  by: self and significant other  PERTINENT HISTORY: Pt is an 82 y/o male s/p right CVA on 11/13/22, stayed at [facility] for approximately 7 days, was released on 11/21/22. Pt presents with wife, reports he is not having difficulty with any tasks just fatigues easier.   PAIN:  Are you having pain? No  FALLS: Has patient fallen in last 6 months?  Yes  LIVING ENVIRONMENT: Lives with: lives with their spouse Lives in: House/apartment  PLOF:  Level of assistance: Independent with ADLs, Independent with IADLs Employment: Retired  PATIENT GOALS: "Improve balance"  OBJECTIVE:    DIAGNOSTIC FINDINGS: ***  COGNITION: Overall cognitive status: Within functional limits for tasks assessed Areas of impairment:  Attention: Impaired: Sustained Functional deficits: ***  AUDITORY COMPREHENSION: Overall auditory comprehension: Appears intact YES/NO questions: Appears intact Following directions: Appears intact Conversation: Complex Interfering components:  N/A Effective technique:  N/A  READING COMPREHENSION: Not assessed  EXPRESSION: verbal  VERBAL EXPRESSION: Level of generative/spontaneous verbalization: conversation Automatic speech: name: intact, social response: intact, and month of year: intact  Repetition: Appears intact Naming:  WFL Pragmatics: Appears intact Comments: N/A Interfering components:  N/A Effective technique:  N/A Non-verbal means of communication: N/A  WRITTEN EXPRESSION: Dominant hand: right; Pt reports that he is ambidextrous Written expression: Not tested  MOTOR SPEECH: Overall motor speech: Appears intact Level of impairment:  N/A Respiration:  WFL Phonation: normal Resonance: WFL Articulation: Appears intact Intelligibility: Intelligible Motor planning: Appears intact Motor speech errors:  N/A Interfering components:  N/A Effective technique:  N/A  ORAL MOTOR EXAMINATION: Overall status: WFL Comments: N/A   STANDARDIZED ASSESSMENTS: SLUMS: 25/30  [place] Examination Orientation  3/3  Numeric Problem Solving  3/3  Memory  4/5  Attention 0/2  Thought Organization 3/3  Clock Drawing 4/4  Visuospatial Skills               2/2  Short Story Recall  6/8  Total  25/30     Scoring  High School Education  Less than High School Education   Normal  27-30 25-30  Mild Neurocognitive Disorder 21-26 20-24  Dementia  1-20 1-19    TODAY'S TREATMENT:  Evaluation only and no follow up                                                                                                                                        DATE: 11/25/22   PATIENT EDUCATION: Education details: Pt and spouse in agreement that Pt is at prior level of function and on SLP therapy recommended at this time. Person educated: Patient and Spouse Education method: Explanation Education comprehension: verbalized understanding   ASSESSMENT:  CLINICAL IMPRESSION: Patient is an 82 y.o. male who was seen today for a cognitive linguistic evaluation after a referral from .   OBJECTIVE IMPAIRMENTS: include attention. These impairments are limiting patient from . Factors affecting potential to achieve goals and  functional outcome are {SLP factors:25450}. Patient will benefit from skilled SLP services to address above impairments and improve overall function.  REHAB POTENTIAL: {rehabpotential:25112}  PLAN:  SLP FREQUENCY: {rehab frequency:25116}  SLP DURATION: {rehab duration:25117}  PLANNED INTERVENTIONS: {SLP treatment/interventions:25449}    Thank you,  Genene Churn, [place], CCC-SLP 11/25/2022, 11:19 AM

## 2022-11-26 ENCOUNTER — Ambulatory Visit (HOSPITAL_COMMUNITY): Payer: Medicare Other

## 2022-11-26 ENCOUNTER — Other Ambulatory Visit: Payer: Self-pay

## 2022-11-26 DIAGNOSIS — R29818 Other symptoms and signs involving the nervous system: Secondary | ICD-10-CM | POA: Diagnosis not present

## 2022-11-26 DIAGNOSIS — R262 Difficulty in walking, not elsewhere classified: Secondary | ICD-10-CM

## 2022-11-26 NOTE — Therapy (Signed)
OUTPATIENT PHYSICAL THERAPY NEURO EVALUATION   Patient Name: Damon Parrish MRN: [id number] DOB:[birth year], 82 y.o., male Today's Date: 11/26/2022   PCP: Tobe Sos, MD REFERRING PROVIDER: Cathlyn Parsons, PA-C  END OF SESSION:  PT End of Session - 11/26/22 1247     Visit Number 1    Number of Visits 16    Date for PT Re-Evaluation 01/21/23    Authorization Type Medicare    Progress Note Due on Visit 8    PT Start Time 0945    PT Stop Time 1030    PT Time Calculation (min) 45 min    Activity Tolerance Patient tolerated treatment well    Behavior During Therapy WFL for tasks assessed/performed            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   GERD (gastroesophageal reflux disease)    H/O cerebral aneurysm repair 2016   "put stent in"   High cholesterol    History of blood transfusion ~ 1950   "while in hospital w/pneumonia" (01/09/2018)   History of kidney stones    Hypertension    Nonruptured cerebral aneurysm    Pneumonia ~ 1950   Red man syndrome    Seizures ([place]) 12/27/2015; 12/28/2015   Stroke ([place]) 12/2015   denies residual on 01/09/2018)   Past Surgical History:  Procedure Laterality Date   CATARACT EXTRACTION W/ INTRAOCULAR LENS  IMPLANT, BILATERAL Bilateral    COLONOSCOPY     2013 per patient: done in [place], normal, next colonoscopy due in 10 years   FOOT FRACTURE SURGERY Left    "pins, etc. in there"   FRACTURE SURGERY     LOOP RECORDER INSERTION N/A 11/15/2022   Procedure: LOOP RECORDER INSERTION;  Surgeon: Melida Quitter, MD;  Location: [facility];  Service: Cardiovascular;  Laterality: N/A;   RADIOLOGY WITH ANESTHESIA N/A 05/04/2015   Procedure: Pipeline Embolization;  Surgeon: Consuella Lose, MD;  [facility];  Service: Radiology;  Laterality: N/A;    RADIOLOGY WITH ANESTHESIA N/A 05/31/2015   Procedure: Pipeline Embolization;  Surgeon: Consuella Lose, MD;  Location: [place];  Service: Radiology;  Laterality: N/A;   Patient Active Problem List   Diagnosis Date Noted   CVA (cerebral vascular accident) ([place]) 11/14/2022   Seizure disorder ([place]) 11/13/2022   Pulmonary nodule 11/13/2022   Cough 11/13/2022   Left leg numbness    Muscle fasciculation    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[place]) 01/07/2019   Allergic reaction    DRESS syndrome    Pressure injury of skin 01/09/2018   Adverse drug reaction 01/08/2018   Dermatitis, drug-induced 01/08/2018   Discitis 12/17/2017   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   Rectal bleeding    Constipation 12/16/2017   Cerebrovascular accident (CVA) due to embolism of right middle cerebral artery ([place]) 03/06/2016   S/P cerebral aneurysm repair 03/06/2016   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   Palpitations 01/05/2016   Aneurysm, cerebral, nonruptured    Hyperlipidemia    Acute CVA (cerebrovascular accident) ([place]) 12/30/2015   Acute right MCA stroke ([place])    Focal seizure ([place]) 12/28/2015   HTN (hypertension) 12/28/2015   Hyponatremia 12/28/2015   Cerebral aneurysm 05/04/2015    ONSET DATE: 11/13/2022  REFERRING DIAG: ACUTE RIGHT MCA  STROKE  THERAPY DIAG:  Difficulty walking  Rationale for Evaluation and Treatment: Rehabilitation  SUBJECTIVE:                                                                                                                                                                                             SUBJECTIVE STATEMENT: Comes to the clinic with some unsteadiness when walking. Also c/o weakness on the L LE. Reports that the L LE feels like it's going to give way. Patient had CVA in 11/13/22. Patient was admitted to the hospital for the week (including inpatient rehab). Upon D/C patient was then sent to outpatient PT evaluation and  management.  Pt accompanied by:  wife  PERTINENT HISTORY: HTN, hx of osteomyelitis of the thoracic spine, hx of L foot fx   PAIN:  Are you having pain? No  PRECAUTIONS: None  WEIGHT BEARING RESTRICTIONS: No  FALLS: Has patient fallen in last 6 months? Yes. Number of falls 1 (10/03/2022), MD already aware  LIVING ENVIRONMENT: Lives with: lives with their spouse Lives in: House/apartment Stairs: Yes: External: 1 steps; none Has following equipment at home: Single point cane, Environmental consultant - 2 wheeled, Environmental consultant - 4 wheeled, and Wheelchair (manual)  PLOF: Independent with community mobility with device [facility])  PATIENT GOALS: "get going and get moving"  OBJECTIVE:   DIAGNOSTIC FINDINGS:  MRI HEAD WITHOUT CONTRAST 11/13/2022   TECHNIQUE: Multiplanar, multiecho pulse sequences of the brain and surrounding structures were obtained without intravenous contrast.   COMPARISON:  12/30/2015   FINDINGS: Brain: Small area of abnormal diffusion restriction within the right corona radiata. Chronic right subdural hematoma. No acute hemorrhage. There is multifocal hyperintense T2-weighted signal within the white matter. Generalized volume loss. The midline structures are normal.   Vascular: Major flow voids are preserved.   Skull and upper cervical spine: Normal calvarium and skull base. Visualized upper cervical spine and soft tissues are normal.   Sinuses/Orbits:No paranasal sinus fluid levels or advanced mucosal thickening. No mastoid or middle ear effusion. Normal orbits.   IMPRESSION: 1. Small area of acute or early subacute ischemia within the right corona radiata. 2. Chronic right subdural hematoma.    COGNITION: Overall cognitive status: Within functional limits for tasks assessed   SENSATION: Light touch: WFL  COORDINATION: Intact  MUSCLE TONE: WNL on B LE  MUSCLE LENGTH: Mild tightness on B hamstrings and gastrocsoleus complex  POSTURE: rounded shoulders and forward  head  LOWER EXTREMITY ROM:     Active  Right Eval Left Eval  Hip flexion [facility]  Hip extension [facility]  Hip abduction [facility]  Hip adduction  Hip internal rotation    Hip external rotation    Knee flexion [facility] WFL  Knee extension [facility]  Ankle dorsiflexion [facility]  Ankle plantarflexion [facility] WFL  Ankle inversion    Ankle eversion     (Blank rows = not tested)  LOWER EXTREMITY MMT:    MMT Right Eval Left Eval  Hip flexion 4 4+  Hip extension 3+ 3+  Hip abduction 4 4  Hip adduction    Hip internal rotation    Hip external rotation    Knee flexion 4+ 4  Knee extension 4+ 4  Ankle dorsiflexion 4+ 4+  Ankle plantarflexion 4+ 4+  Ankle inversion    Ankle eversion    (Blank rows = not tested)  BED MOBILITY:  Sit to supine Complete Independence Supine to sit Complete Independence Rolling to Right Complete Independence Rolling to Left Complete Independence  TRANSFERS: Assistive device utilized: Single point cane  Sit to stand: Modified independence Stand to sit: Modified independence  GAIT:  Distance walked: 311 ft Assistive device utilized: Single point cane Level of assistance: Modified independence Comments: B LE in ER, wide BOS  FUNCTIONAL TESTS:  5 times sit to stand: 17.83 sec 2 minute walk test: 311 ft with SPC. Tinetti POMA: 16 (balance + gait) done with SPC   TODAY'S TREATMENT:                                                                                                                              DATE:  11/26/22 Evaluation done Patient education Seated gastrocnemius stretch x 30" x 3  Mini squats with slight UE x 3" x 10   PATIENT EDUCATION: Education details: Educated on the pathoanatomy of CVA. Educated on the goals and course of rehab. Written HEP provided and reviewed. Education on falls risk reduction at home Person educated: Patient Education method: Explanation, Demonstration, and Handouts Education comprehension: verbalized  understanding and returned demonstration  HOME EXERCISE PROGRAM: Access Code: [email or web address] URL: [email or web address] Date: 11/26/2022 Prepared by: Rexene Alberts  Exercises - Seated Calf Stretch with Strap  - 1-2 x daily - 5-7 x weekly - 3 reps - 30 hold - Mini Squat with Counter Support  - 1-2 x daily - 5-7 x weekly - 2 sets - 10 reps - 3 hold  GOALS: Goals reviewed with patient? Yes  SHORT TERM GOALS: Target date: 12/24/22   Pt will demonstrate indep in HEP to facilitate carry-over of skilled services and improve functional outcomes Goal status: INITIAL  LONG TERM GOALS: Target date: 01/21/23  Pt will decrease 5TSTS by at least 3 seconds in order to demonstrate clinically significant improvement in LE strength Baseline: 17.83 Goal status: INITIAL  2.  Pt will increase 2MWT by at least 40 ft in order to demonstrate clinically significant improvement in community ambulation Baseline: 311 ft Goal status: INITIAL  3.  Patient will demonstrate increase in Tinetti Score by 4 points in order to demonstrate  clinically significant improvement in balance and decreased risk for falls Baseline: 16 Goal status: INITIAL  4.  Pt will demonstrate increase in LE strength to 4+/5 to facilitate ease and safety in ambulation  Baseline: 3+/5 Goal status: INITIAL ASSESSMENT:  CLINICAL IMPRESSION: Patient is a 82 y.o. make who was seen today for physical therapy evaluation and treatment for S/P MCA stroke. Patient was diagnosed with S/P MCA stoke by referring provider further defined by difficulty with walking due to  weakness, impaired proprioception, and decreased soft tissue extensibility. Skilled PT is required to address the impairments and functional limitations listed below.   OBJECTIVE IMPAIRMENTS: Abnormal gait, difficulty walking, decreased ROM, decreased strength, and impaired flexibility.   ACTIVITY LIMITATIONS: carrying, lifting, bending, standing, and  transfers  PARTICIPATION LIMITATIONS: meal prep, cleaning, laundry, and community activity  PERSONAL FACTORS: Age and Fitness are also affecting patient's functional outcome.   REHAB POTENTIAL: Good  CLINICAL DECISION MAKING: Stable/uncomplicated  EVALUATION COMPLEXITY: Low  PLAN:  PT FREQUENCY: 2x/week  PT DURATION: 8 weeks  PLANNED INTERVENTIONS: Therapeutic exercises, Therapeutic activity, Neuromuscular re-education, Balance training, Gait training, Patient/Family education, Self Care, Stair training, and Manual therapy  PLAN FOR NEXT SESSION: Progress LE strengthening and stretching as tolerated. May begin balance and gait activities. Update HEP prn.   Harvie Heck. Travion Ke, PT, DPT, OCS Board-Certified Clinical Specialist in [place] # ([place]): [id number] T 11/26/2022, 12:49 PM

## 2022-11-27 ENCOUNTER — Ambulatory Visit (INDEPENDENT_AMBULATORY_CARE_PROVIDER_SITE_OTHER): Payer: Medicare Other | Admitting: Neurology

## 2022-11-27 ENCOUNTER — Encounter: Payer: Self-pay | Admitting: Neurology

## 2022-11-27 VITALS — BP 116/68 | HR 68 | Ht 69.0 in | Wt 180.8 lb

## 2022-11-27 DIAGNOSIS — I639 Cerebral infarction, unspecified: Secondary | ICD-10-CM

## 2022-11-27 DIAGNOSIS — G40109 Localization-related (focal) (partial) symptomatic epilepsy and epileptic syndromes with simple partial seizures, not intractable, without status epilepticus: Secondary | ICD-10-CM | POA: Diagnosis not present

## 2022-11-27 MED ORDER — LEVETIRACETAM 500 MG PO TABS: 500.0000 mg | ORAL_TABLET | Freq: Two times a day (BID) | ORAL | 3 refills | Status: DC

## 2022-11-27 MED ORDER — CLOPIDOGREL BISULFATE 75 MG PO TABS: 75.0000 mg | ORAL_TABLET | Freq: Every day | ORAL | 3 refills | Status: DC

## 2022-11-27 NOTE — Progress Notes (Signed)
NEUROLOGY CONSULTATION NOTE  Damon Parrish MRN: [id number] DOB: 07/22/1941  Referring provider: Dr. Ralph Parrish Primary care provider: Dr. Ralph Parrish  Reason for consult:  establish care for epilepsy  Dear Dr Damon Parrish:  Thank you for your kind referral of Damon Parrish for consultation of the above symptoms. Although his history is well known to you, please allow me to reiterate it for the purpose of our medical record. The patient was accompanied to the clinic by his wife who also provides collateral information. Records and images were personally reviewed where available.   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 Records from [facility] and recent hospitalization were reviewed and will be summarized as follows. In 12/2015, he presented for new onset seizures with left arm>leg rhythmic shaking lasting for an hour. MRI brain showed patchy acute infarct in the right frontoparietal MCA/ACA territory. EEG was normal. He was discharged home on Levetiracetam. He had a 30-day event monitor at that time which was normal. Since he was 6 months out from stroke and seizure, Levetiracetam was weaned off in 2017 and EEG after was normal. In 12/2018, his wife reports he had an episode of involuntary left leg movements and weakness. Levetiracetam '500mg'$  BID was restarted. Over time, his wife reports that he had been taking it once a day. He was event-free for almost 4 years until 11/13/22 when he noticed he could not hold the fork in his left hand. His left leg also got weak. After he finished eating, he noticed involuntary left hand movements. He had a witnessed episode on 11/14/22, his wife reports  his left hand clenched then fingers extended out. MRI brain showed acute right punctate MCA/ACA watershed infarcts, concerning for embolic source. CTA head and neck did not show any significant stenosis, there was mild atherosclerotic change at the right carotid bifurcation but no stenosis, right ICA stent in the distal siphon to supraclinoid ICA with no residual aneurysm flow. Overnight EEG no epileptiform discharges, there was intermittent right hemisphere slowing. Echocardiogram showed EF of 60-65%, HbA1c 6.1, LDL 81. He was discharged home on increased dose of Lipitor, DAPT for 3 weeks followed by Plavix alone, and Levetiracetam '500mg'$  BID. Exam on hospital discharge indicated mild left arm weakness with ataxia, tremor on right upper extremity (baseline). He was also discharged with a loop recorder due to concern for embolic source.   He and his wife deny any further seizures since 11/14/22. He is tolerating LEV '500mg'$  BID without significant side effects. His wife denies any staring/unresponsive episodes. He reports he was fully aware during the seizures, no difficulty speaking or comprehending. He denies any olfactory/gustatory hallucinations, deja vu, rising epigastric sensation, focal numbness/tingling/weakness, myoclonic jerks. He denies any headaches, diplopia, dysarthria/dysphagia, neck/back pain, bowel/bladder dysfunction.His wife just recently noticed some twitching of his right upper arm when she held it. He has had a left hand tremor over the past year that does not affect daily activities. Memory is pretty good. His wife has been managing medications for years. He gets 7-9 hours of interrupted sleep, he may take naps during the day. He lives with his wife.   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 He had a normal birth and early development.  There is  no history of febrile convulsions, CNS infections such as meningitis/encephalitis, significant traumatic brain injury,  neurosurgical procedures, or family history of seizures.    PAST MEDICAL HISTORY: 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   GERD (gastroesophageal reflux disease)    H/O cerebral aneurysm repair 2016   "put stent in"   High cholesterol    History of blood transfusion ~ 1950   "while in hospital w/pneumonia" (01/09/2018)   History of kidney stones    Hypertension    Nonruptured cerebral aneurysm    Pneumonia ~ 1950   Red man syndrome    Seizures ([place]) 12/27/2015; 12/28/2015   Stroke ([place]) 12/2015   denies residual on 01/09/2018)    PAST SURGICAL HISTORY: Past Surgical History:  Procedure Laterality Date   CATARACT EXTRACTION W/ INTRAOCULAR LENS  IMPLANT, BILATERAL Bilateral    COLONOSCOPY     2013 per patient: done in [place], normal, next colonoscopy due in 10 years   EYE SURGERY Left 06/17/2022   FOOT FRACTURE SURGERY Left    "pins, etc. in there"   [place] N/A 11/15/2022   Procedure: LOOP RECORDER INSERTION;  Surgeon: Damon Quitter, MD;  Location: [facility];  Service: Cardiovascular;  Laterality: N/A;   RADIOLOGY WITH ANESTHESIA N/A 05/04/2015   Procedure: Pipeline Embolization;  Surgeon: Damon Lose, MD;  Location: [facility];  Service: Radiology;  Laterality: N/A;   RADIOLOGY WITH ANESTHESIA N/A 05/31/2015   Procedure: Pipeline Embolization;  Surgeon: Damon Lose, MD;  Location: [facility];  Service: Radiology;  Laterality: N/A;    MEDICATIONS: Current Outpatient Medications on File Prior to Visit  Medication Sig Dispense Refill   acetaminophen (TYLENOL) 325 MG tablet Take 1-2 tablets (325-650 mg total) by mouth every 4 (four) hours as needed for mild pain.     Ascorbic Acid (VITAMIN C) 1000 MG tablet Take 1,000 mg by mouth daily.     aspirin EC 81 MG  tablet Take 1 tablet (81 mg total) by mouth daily for 15 days. Swallow whole. 15 tablet 0   atorvastatin (LIPITOR) 80 MG tablet Take 1 tablet (80 mg total) by mouth at bedtime. 30 tablet 0   bimatoprost (LUMIGAN) 0.03 % ophthalmic solution Place 1 drop into the right eye at bedtime.     Brinzolamide-Brimonidine (SIMBRINZA) 1-0.2 % SUSP Place 1 drop into both eyes 2 (two) times daily.      calcium carbonate (OSCAL) 1500 (600 Ca) MG TABS tablet Take by mouth 2 (two) times daily with a meal.     Cholecalciferol (VITAMIN D-1000 MAX ST) 1000 units tablet Take 1 tablet by mouth daily.     clopidogrel (PLAVIX) 75 MG tablet Take 1 tablet (75 mg total) by mouth daily. 30 tablet 0   finasteride (PROSCAR) 5 MG tablet Take 1 tablet (5 mg total) by mouth daily. 30 tablet 0   fluticasone (FLONASE) 50 MCG/ACT nasal spray Place 2 sprays into both nostrils daily.     levETIRAcetam (KEPPRA) 500 MG tablet Take 1 tablet (500 mg total) by mouth 2 (two) times daily. 60 tablet 0   Multiple Vitamin (MULTIVITAMIN) capsule Take 1 capsule by mouth daily.     multivitamin-lutein (OCUVITE-LUTEIN) CAPS capsule Take 1 capsule by mouth daily.     prednisoLONE acetate (  PRED FORTE) 1 % ophthalmic suspension Place 1 drop into the left eye daily.     senna-docusate (SENOKOT-S) 8.6-50 MG tablet Take 2 tablets by mouth at bedtime as needed for mild constipation.     tamsulosin (FLOMAX) 0.4 MG CAPS capsule Take 1 capsule (0.4 mg total) by mouth daily after supper.     timolol (TIMOPTIC) 0.25 % ophthalmic solution Place 1 drop into the right eye daily.     TRELEGY ELLIPTA 100-62.5-25 MCG/ACT AEPB Inhale 1 puff into the lungs daily.     vitamin E 400 UNIT capsule Take 400 Units by mouth daily.     No current facility-administered medications on file prior to visit.    ALLERGIES: Allergies  Allergen Reactions   Cephalosporins Rash    Full body rash with systemic symptoms   Vancomycin Rash    Full body rash with systemic symptoms     FAMILY HISTORY: Family History  Problem Relation Age of Onset   Lymphoma Mother    Dementia Mother    Aneurysm Father    Colon cancer Neg Hx     SOCIAL HISTORY: Social History   Socioeconomic History   Marital status: Married    Spouse name: Not on file   Number of children: Not on file   Years of education: Not on file   Highest education level: Not on file  Occupational History   Not on file  Tobacco Use   Smoking status: Every Day    Packs/day: 1.00    Years: 61.00    Total pack years: 61.00    Types: Cigarettes   Smokeless tobacco: Never  Vaping Use   Vaping Use: Never used  Substance and Sexual Activity   Alcohol use: No    Alcohol/week: 0.0 standard drinks of alcohol   Drug use: No   Sexual activity: Not Currently  Other Topics Concern   Not on file  Social History Narrative   Are you right handed or left handed? Right handed    Are you currently employed ? Retired    What is your current occupation?   Do you live at home alone?no    Who lives with you? wife   What type of home do you live in: 1 story or 2 story?  1 story, ranch home 1 step        Social Determinants of Health   Financial Resource Strain: Not on file  Food Insecurity: No Food Insecurity (11/14/2022)   Hunger Vital Sign    Worried About Running Out of Food in the Last Year: Never true    Ran Out of Food in the Last Year: Never true  Transportation Needs: No Transportation Needs (11/14/2022)   PRAPARE - Hydrologist (Medical): No    Lack of Transportation (Non-Medical): No  Physical Activity: Not on file  Stress: Not on file  Social Connections: Not on file  Intimate Partner Violence: Not At Risk (11/14/2022)   Humiliation, Afraid, Rape, and Kick questionnaire    Fear of Current or Ex-Partner: No    Emotionally Abused: No    Physically Abused: No    Sexually Abused: No     PHYSICAL EXAM: Vitals:   11/27/22 0958  BP: 116/68  Pulse: 68  SpO2:  97%   General: No acute distress Head:  Normocephalic/atraumatic Skin/Extremities: No rash, no edema Neurological Exam: Mental status: alert and oriented to person, place, and time, no dysarthria or aphasia, Fund of knowledge is  appropriate.  Recent and remote memory are intact, 3/3 delayed recall. Attention and concentration are normal, 5/5 WORLD backwards. Cranial nerves: CN I: not tested CN II: pupils equal, round, visual fields intact CN III, IV, VI:  full range of motion, no nystagmus, no ptosis CN V: facial sensation intact CN VII: upper and lower face symmetric CN VIII: hearing intact to conversation Bulk & Tone: normal, no fasciculations. Motor: 5/5 throughout with no pronator drift. Sensation: intact to light touch, cold, pin, vibration sense.  No extinction to double simultaneous stimulation.  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 They report focal motor seizures with no impairment of awareness, none since 11/14/22. Continue Levetiracetam '500mg'$  BID, refills sent. He has a loop recorder now, continue DAPT until 3/14 as instructed, then Plavix '75mg'$  daily moving forward for secondary stroke prevention. Continue control of cholesterol, BP, glucose levels.  [place] driving laws were discussed with the patient, and he knows to stop driving after an episode of loss of awareness, until 6 months seizure-free. Follow-up in 4-5 months, call for any  changes.    Thank you for allowing me to participate in the care of this patient. Please do not hesitate to call for any questions or concerns.   Ellouise Newer, M.D.  CC: Dr. Shon Parrish

## 2022-11-27 NOTE — Patient Instructions (Signed)
Good to meet you.  Continue aspirin and Plavix combination until March 14 as instructed, then Plavix alone moving forward  2. Continue Levetiracetam (Keppra) '500mg'$  twice a day  3. Continue control of cholesterol, sugar levels, blood pressure with PCP  4. Follow-up in 4-5 months, call for any changes   Seizure Precautions: 1. If medication has been prescribed for you to prevent seizures, take it exactly as directed.  Do not stop taking the medicine without talking to your doctor first, even if you have not had a seizure in a long time.   2. Avoid activities in which a seizure would cause danger to yourself or to others.  Don't operate dangerous machinery, swim alone, or climb in high or dangerous places, such as on ladders, roofs, or girders.  Do not drive unless your doctor says you may.  3. If you have any warning that you may have a seizure, lay down in a safe place where you can't hurt yourself.    4.  No driving for 6 months from last seizure, as per [facility].   Please refer to the following link on the [place] website for more information: http://[email or web address]/[email or web address]   5.  Maintain good sleep hygiene. Avoid alcohol.  6.  Contact your doctor if you have any problems that may be related to the medicine you are taking.  7.  Call 911 and bring the patient back to the ED if:        A.  The seizure lasts longer than 5 minutes.       B.  The patient doesn't awaken shortly after the seizure  C.  The patient has new problems such as difficulty seeing, speaking or moving  D.  The patient was injured during the seizure  E.  The patient has a temperature over 102 F (39C)  F.  The patient vomited and now is having trouble breathing

## 2022-11-28 NOTE — Telephone Encounter (Signed)
Pt is connected and transmitting.  Will continue to remotely monitor.

## 2022-11-29 ENCOUNTER — Other Ambulatory Visit: Payer: Self-pay

## 2022-11-29 DIAGNOSIS — R911 Solitary pulmonary nodule: Secondary | ICD-10-CM

## 2022-12-02 ENCOUNTER — Inpatient Hospital Stay: Payer: Medicare Other | Attending: Internal Medicine | Admitting: Internal Medicine

## 2022-12-02 ENCOUNTER — Inpatient Hospital Stay: Payer: Medicare Other

## 2022-12-02 ENCOUNTER — Telehealth: Payer: Self-pay | Admitting: Anesthesiology

## 2022-12-02 VITALS — BP 146/78 | HR 66 | Temp 97.6°F | Resp 15 | Wt 182.3 lb

## 2022-12-02 DIAGNOSIS — R911 Solitary pulmonary nodule: Secondary | ICD-10-CM | POA: Diagnosis not present

## 2022-12-02 DIAGNOSIS — F17210 Nicotine dependence, cigarettes, uncomplicated: Secondary | ICD-10-CM | POA: Insufficient documentation

## 2022-12-02 DIAGNOSIS — Z8673 Personal history of transient ischemic attack (TIA), and cerebral infarction without residual deficits: Secondary | ICD-10-CM | POA: Insufficient documentation

## 2022-12-02 DIAGNOSIS — R059 Cough, unspecified: Secondary | ICD-10-CM | POA: Diagnosis not present

## 2022-12-02 LAB — CBC WITH DIFFERENTIAL (CANCER CENTER ONLY)
Abs Immature Granulocytes: 0.02 10*3/uL (ref 0.00–0.07)
Basophils Absolute: 0 10*3/uL (ref 0.0–0.1)
Basophils Relative: 0 %
Eosinophils Absolute: 0.2 10*3/uL (ref 0.0–0.5)
Eosinophils Relative: 3 %
HCT: 41.4 % (ref 39.0–52.0)
Hemoglobin: 14.5 g/dL (ref 13.0–17.0)
Immature Granulocytes: 0 %
Lymphocytes Relative: 35 %
Lymphs Abs: 2.5 10*3/uL (ref 0.7–4.0)
MCH: 34.7 pg — ABNORMAL HIGH (ref 26.0–34.0)
MCHC: 35 g/dL (ref 30.0–36.0)
MCV: 99 fL (ref 80.0–100.0)
Monocytes Absolute: 0.6 10*3/uL (ref 0.1–1.0)
Monocytes Relative: 8 %
Neutro Abs: 4 10*3/uL (ref 1.7–7.7)
Neutrophils Relative %: 54 %
Platelet Count: 259 10*3/uL (ref 150–400)
RBC: 4.18 MIL/uL — ABNORMAL LOW (ref 4.22–5.81)
RDW: 12.7 % (ref 11.5–15.5)
WBC Count: 7.3 10*3/uL (ref 4.0–10.5)
nRBC: 0 % (ref 0.0–0.2)

## 2022-12-02 LAB — CMP (CANCER CENTER ONLY)
ALT: 19 U/L (ref 0–44)
AST: 17 U/L (ref 15–41)
Albumin: 3.9 g/dL (ref 3.5–5.0)
Alkaline Phosphatase: 110 U/L (ref 38–126)
Anion gap: 6 (ref 5–15)
BUN: 14 mg/dL (ref 8–23)
CO2: 29 mmol/L (ref 22–32)
Calcium: 9.4 mg/dL (ref 8.9–10.3)
Chloride: 99 mmol/L (ref 98–111)
Creatinine: 1.03 mg/dL (ref 0.61–1.24)
GFR, Estimated: >60 mL/min (ref >60)
Glucose, Bld: 127 mg/dL — ABNORMAL HIGH (ref 70–99)
Potassium: 4.3 mmol/L (ref 3.5–5.1)
Sodium: 134 mmol/L — ABNORMAL LOW (ref 135–145)
Total Bilirubin: 0.3 mg/dL (ref 0.3–1.2)
Total Protein: 6.6 g/dL (ref 6.5–8.1)

## 2022-12-02 NOTE — Telephone Encounter (Signed)
Pt's wife called stating, pt needs a letter from Dr Delice Lesch stating pt is cleared to proceed with eye surgery. The letter needs to go to Dr Lyndel Safe at [facility]. Fax [phone]. Attn [employer]

## 2022-12-02 NOTE — Progress Notes (Signed)
This navigator met the pt for the first time today. He is accompanied by his wife, [name].  Patient is scheduled to see Pulmonology on 3/14 and needs a PET scan to continue work-up. This navigator will schedule the PET scan for the pt and call with the details.  Patient will follow up with Dr.Mohamed upon the completion of his PET scan.  No other needs voiced by the pt or his wife at this time.  This navigator will continue to follow pt.

## 2022-12-02 NOTE — Progress Notes (Signed)
[place] [phone]   [phone]  CONSULT NOTE  REFERRING PHYSICIAN: Dr. Ralph Leyden  REASON FOR CONSULTATION:  82 years old white male with suspicious pulmonary nodule  HPI Damon Parrish is a 82 y.o. male with past medical history significant for osteoarthritis, chronic lower back pain, epidermal abscess, GERD, dyslipidemia, hypertension, pneumonia as well as seizure and stroke.  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  He had CT of the head by the stroke team and that showed mild increase in thickness of right subdural low-attenuation collection measuring 1.1 cm in thickness with suspected trace left subdural low-attenuation fluid collection that cannot change it.  MRI of the brain on 11/13/2022 showed small area of acute or early subacute ischemia within the right corona radiata with chronic right subdural hematoma.  During his evaluation he had CT angiogram of the head and neck on 11/13/2022 and incidentally it showed 1.1 cm nodule in the medial right upper lobe that is new from 2017 and worrisome for possible lung cancer.  He had CT scan of the chest without contrast on the same day and that showed 1 0.5 x 1.0 x 1.4 cm suspicious spiculated right solid pulmonary nodule within the upper lobe. The patient was referred to me today for evaluation and recommendation regarding treatment of his condition. When seen today the patient is feeling fine with no concerning complaints except for mild cough.  He denied having any chest pain, shortness of breath or hemoptysis.  He has no nausea, vomiting, diarrhea or constipation.  He has no headache or visual changes. Family history significant for father with heart disease and died at age 73.  Mother had lymphoma. The patient is married and has 1 son  and 1 stepson.  He was accompanied by his wife [name].  The patient used to work in Press photographer for [employer].  He has a history of smoking more than 1 pack/day for over 66 years and unfortunately he continues to smoke.  He has no history of alcohol or drug abuse.  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   GERD (gastroesophageal reflux disease)    H/O cerebral aneurysm repair 2016   "put stent in"   High cholesterol    History of blood transfusion ~ 1950   "while in hospital w/pneumonia" (01/09/2018)   History of kidney stones    Hypertension    Nonruptured cerebral aneurysm    Pneumonia ~ 1950   Red man syndrome    Seizures ([place]) 12/27/2015; 12/28/2015   Stroke ([place]) 12/2015   denies residual on 01/09/2018)    Past Surgical History:  Procedure Laterality Date   CATARACT EXTRACTION W/ INTRAOCULAR LENS  IMPLANT, BILATERAL Bilateral    COLONOSCOPY     2013 per patient: done in [place], normal, next colonoscopy due in 10 years   EYE SURGERY Left 06/17/2022   FOOT FRACTURE SURGERY Left    "pins, etc. in there"   [place] N/A 11/15/2022   Procedure: LOOP RECORDER INSERTION;  Surgeon: Melida Quitter, MD;  Location: [facility];  Service: Cardiovascular;  Laterality: N/A;   RADIOLOGY WITH ANESTHESIA N/A 05/04/2015   Procedure: Pipeline Embolization;  Surgeon: Consuella Lose, MD;  Location: [facility];  Service: Radiology;  Laterality: N/A;   RADIOLOGY WITH ANESTHESIA N/A 05/31/2015   Procedure: Pipeline Embolization;  Surgeon: Consuella Lose, MD;  Location: [facility];  Service: Radiology;  Laterality: N/A;    Family History  Problem Relation Age of Onset   Lymphoma Mother    Dementia Mother    Aneurysm Father    Colon cancer Neg Hx     Social  History Social History   Tobacco Use   Smoking status: Every Day    Packs/day: 1.00    Years: 61.00    Total pack years: 61.00    Types: Cigarettes   Smokeless tobacco: Never  Vaping Use   Vaping Use: Never used  Substance Use Topics   Alcohol use: No    Alcohol/week: 0.0 standard drinks of alcohol   Drug use: No    Allergies  Allergen Reactions   Cephalosporins Rash    Full body rash with systemic symptoms   Vancomycin Rash    Full body rash with systemic symptoms    Current Outpatient Medications  Medication Sig Dispense Refill   acetaminophen (TYLENOL) 325 MG tablet Take 1-2 tablets (325-650 mg total) by mouth every 4 (four) hours as needed for mild pain.     Ascorbic Acid (VITAMIN C) 1000 MG tablet Take 1,000 mg by mouth daily.     aspirin EC 81 MG tablet Take 1 tablet (81 mg total) by mouth daily for 15 days. Swallow whole. 15 tablet 0   atorvastatin (LIPITOR) 80 MG tablet Take 1 tablet (80 mg total) by mouth at bedtime. 30 tablet 0   bimatoprost (LUMIGAN) 0.03 % ophthalmic solution Place 1 drop into the right eye at bedtime.     Brinzolamide-Brimonidine (SIMBRINZA) 1-0.2 % SUSP Place 1 drop into both eyes 2 (two) times daily.      calcium carbonate (OSCAL) 1500 (600 Ca) MG TABS tablet Take by mouth 2 (two) times daily with a meal.     Cholecalciferol (VITAMIN D-1000 MAX ST) 1000 units tablet Take 1 tablet by mouth daily.     clopidogrel (PLAVIX) 75 MG tablet Take 1 tablet (75 mg total) by mouth daily. 90 tablet 3   finasteride (PROSCAR) 5 MG tablet Take 1 tablet (5 mg total) by mouth daily. 30 tablet 0   fluticasone (FLONASE) 50 MCG/ACT nasal spray Place 2 sprays into both nostrils daily.     levETIRAcetam (KEPPRA) 500 MG tablet Take 1 tablet (500 mg total) by mouth 2 (two) times daily. 180 tablet 3   Multiple Vitamin (MULTIVITAMIN) capsule Take 1 capsule by mouth daily.     multivitamin-lutein (OCUVITE-LUTEIN) CAPS capsule Take 1 capsule by mouth daily.      prednisoLONE acetate (PRED FORTE) 1 % ophthalmic suspension Place 1 drop into the left eye daily.     senna-docusate (SENOKOT-S) 8.6-50 MG tablet Take 2 tablets by mouth at bedtime as needed for mild constipation.     tamsulosin (FLOMAX) 0.4 MG CAPS capsule Take 1 capsule (0.4 mg total) by mouth daily after supper.     timolol (TIMOPTIC) 0.25 % ophthalmic solution Place 1 drop into the right eye daily.     TRELEGY ELLIPTA 100-62.5-25 MCG/ACT AEPB Inhale 1 puff into the lungs daily.     vitamin E 400 UNIT capsule Take 400 Units by mouth daily.  No current facility-administered medications for this visit.    Review of Systems  Constitutional: positive for fatigue Eyes: negative Ears, nose, mouth, throat, and face: negative Respiratory: positive for cough Cardiovascular: negative Gastrointestinal: negative Genitourinary:negative Integument/breast: negative Hematologic/lymphatic: negative Musculoskeletal:negative Neurological: negative Behavioral/Psych: negative Endocrine: negative Allergic/Immunologic: negative  Physical Exam  [id number], healthy, no distress, well nourished, and well developed SKIN: skin color, texture, turgor are normal, no rashes or significant lesions HEAD: Normocephalic, No masses, lesions, tenderness or abnormalities EYES: normal, PERRLA, Conjunctiva are pink and non-injected EARS: External ears normal, Canals clear OROPHARYNX:no exudate, no erythema, and lips, buccal mucosa, and tongue normal  NECK: supple, no adenopathy, no JVD LYMPH:  no palpable lymphadenopathy, no hepatosplenomegaly LUNGS: clear to auscultation , and palpation HEART: regular rate & rhythm, no murmurs, and no gallops ABDOMEN:abdomen soft, non-tender, normal bowel sounds, and no masses or organomegaly BACK: Back symmetric, no curvature., No CVA tenderness EXTREMITIES:no joint deformities, effusion, or inflammation, no edema  NEURO: alert & oriented x 3 with fluent speech, no focal  motor/sensory deficits  PERFORMANCE STATUS: ECOG 1  LABORATORY DATA: Lab Results  Component Value Date   WBC 7.3 12/02/2022   HGB 14.5 12/02/2022   HCT 41.4 12/02/2022   MCV 99.0 12/02/2022   PLT 259 12/02/2022      Chemistry      Component Value Date/Time   NA 130 (L) 11/18/2022 0658   K 3.7 11/18/2022 0658   CL 101 11/18/2022 0658   CO2 22 11/18/2022 0658   BUN 15 11/18/2022 0658   CREATININE 0.95 11/18/2022 0658      Component Value Date/Time   CALCIUM 8.7 (L) 11/18/2022 0658   ALKPHOS 98 11/16/2022 0606   AST 19 11/16/2022 0606   ALT 23 11/16/2022 0606   BILITOT 0.5 11/16/2022 0606       RADIOGRAPHIC STUDIES: EP PPM/ICD IMPLANT  Result Date: 11/15/2022 SURGEON:  Melida Quitter, MD   PREPROCEDURE DIAGNOSIS:  Cryptogenic stroke   POSTPROCEDURE DIAGNOSIS: Cryptogenic stroke    PROCEDURES:  1. Implantable loop recorder implantation   INTRODUCTION:  TIAN DOUDS presents with a history of cryptogenic stroke The costs of loop recorder monitoring have been discussed with the patient.   DESCRIPTION OF PROCEDURE:  Informed written consent was obtained.  A timeout was performed. The patient required no sedation for the procedure today. The patients left chest was prepped and draped in the usual sterile fashion. The skin overlying the left parasternal region was infiltrated with lidocaine for local analgesia.  A 0.5-cm incision was made over the left parasternal region over the 3rd intercostal space.  A subcutaneous ILR pocket was fashioned using a combination of sharp and blunt dissection.  A Medtronic Reveal LINQ implantable loop recorder [id number]) was then placed into the pocket  R waves were very prominent and measured >0.41m.  Steri- Strips and a sterile dressing were then applied.  There were no early apparent complications.   CONCLUSIONS:  1. Successful implantation of a implantable loop recorder for a history of cryptogenic stroke  2. No early apparent complications.  AMelida Quitter MD Cardiac Electrophysiology   Overnight EEG with video  Result Date: 11/15/2022 YLora Havens MD     11/15/2022  7:44 PM Patient Name: Damon Parrish: [id number] Attending: PLora HavensReferring Physician/Provider: XRosalin Hawking MD Duration: 11/14/2022 2314 to 11/15/2022 1125 Patient history: 89yoM with left hand shaking jerking spasm for the last 259m. EEG to evaluate for seizure Level of  alertness: Awake, asleep AEDs during EEG study: LEV Technical aspects: This EEG study was done with scalp electrodes positioned according to the 10-20 International system of electrode placement. Electrical activity was reviewed with band pass filter of 1-'70Hz'$ , sensitivity of 7 uV/mm, display speed of 40m/sec with a '60Hz'$  notched filter applied as appropriate. EEG data were recorded continuously and digitally stored.  Video monitoring was available and reviewed as appropriate. Description: The posterior dominant rhythm consists of 9-10 Hz activity of moderate voltage (25-35 uV) seen predominantly in posterior head regions, symmetric and reactive to eye opening and eye closing. Sleep was characterized by vertex waves, sleep spindles (12 to 14 Hz), maximal frontocentral region. EEG showed intermittent 3 to 6 Hz theta-delta slowing in right hemisphere. Generalized low amplitude 12-'15hz'$  beta activity was also noted. Hyperventilation and photic stimulation were not performed.   ABNORMALITY - Intermittent slow, right hemisphere IMPRESSION: This study is suggestive of cortical dysfunction arising from right hemisphere likely secondary to underlying structural abnormality. No seizures or epileptiform discharges were seen throughout the recording. Please note lack of epileptiform activity during interictal EEG does not exclude the diagnosis of epilepsy. PLora Havens  ECHOCARDIOGRAM COMPLETE  Result Date: 11/14/2022    ECHOCARDIOGRAM REPORT   Patient Name:   JTORON BALENTDate of Exam:  11/14/2022 Medical Rec #:  [id number]    Height:       69.0 in Accession #:    [id number]   Weight:       178.8 lb Date of Birth:  325-Oct-1942     BSA:          1.970 m Patient Age:    89years      BP:           130/74 mmHg Patient Gender: M             HR:           67 bpm. Exam Location:  Inpatient Procedure: 2D Echo, Cardiac Doppler and Color Doppler Indications:    R55 Syncope  History:        Patient has no prior history of Echocardiogram examinations.                 Stroke; Risk Factors:Current Smoker and Hypertension.  Sonographer:    GWilkie AyeRVT RCS Referring Phys: [id number] Sonographer Comments: No parasternal window, suboptimal subcostal window, Technically challenging study due to limited acoustic windows and Technically difficult study due to poor echo windows. IMPRESSIONS  1. Left ventricular ejection fraction, by estimation, is 60 to 65%. Left ventricular ejection fraction by 3D volume is 64 %. The left ventricle has normal function. The left ventricle has no regional wall motion abnormalities. There is mild left ventricular hypertrophy. Left ventricular diastolic parameters are consistent with Grade I diastolic dysfunction (impaired relaxation).  2. Right ventricular systolic function is normal. The right ventricular size is mildly enlarged. There is normal pulmonary artery systolic pressure. The estimated right ventricular systolic pressure is 299991111mmHg.  3. The mitral valve is normal in structure. No evidence of mitral valve regurgitation. No evidence of mitral stenosis.  4. The aortic valve was not well visualized. Aortic valve regurgitation is not visualized. No aortic stenosis is present.  5. The inferior vena cava is normal in size with <50% respiratory variability, suggesting right atrial pressure of 8 mmHg. FINDINGS  Left Ventricle: Left ventricular ejection fraction, by estimation, is 60 to 65%. Left ventricular ejection  fraction by 3D volume is 64 %. The left ventricle  has normal function. The left ventricle has no regional wall motion abnormalities. The left ventricular internal cavity size was normal in size. There is mild left ventricular hypertrophy. Left ventricular diastolic parameters are consistent with Grade I diastolic dysfunction (impaired relaxation). Right Ventricle: The right ventricular size is mildly enlarged. No increase in right ventricular wall thickness. Right ventricular systolic function is normal. There is normal pulmonary artery systolic pressure. The tricuspid regurgitant velocity is 1.89  m/s, and with an assumed right atrial pressure of 8 mmHg, the estimated right ventricular systolic pressure is 99991111 mmHg. Left Atrium: Left atrial size was normal in size. Right Atrium: Right atrial size was normal in size. Prominent Eustachian valve. Pericardium: There is no evidence of pericardial effusion. Presence of epicardial fat layer. Mitral Valve: The mitral valve is normal in structure. No evidence of mitral valve regurgitation. No evidence of mitral valve stenosis. Tricuspid Valve: The tricuspid valve is normal in structure. Tricuspid valve regurgitation is trivial. No evidence of tricuspid stenosis. Aortic Valve: The aortic valve was not well visualized. Aortic valve regurgitation is not visualized. No aortic stenosis is present. Aortic valve mean gradient measures 3.0 mmHg. Aortic valve peak gradient measures 6.0 mmHg. Aortic valve area, by VTI measures 2.47 cm. Pulmonic Valve: The pulmonic valve was not well visualized. Aorta: The aortic root is normal in size and structure. Venous: The inferior vena cava is normal in size with less than 50% respiratory variability, suggesting right atrial pressure of 8 mmHg. IAS/Shunts: No atrial level shunt detected by color flow Doppler.  LEFT VENTRICLE PLAX 2D LVIDd:         4.10 cm         Diastology LVIDs:         3.20 cm         LV e' medial:    6.48 cm/s LV PW:         1.10 cm         LV E/e' medial:  9.7 LV IVS:         1.20 cm         LV e' lateral:   7.42 cm/s LVOT diam:     1.80 cm         LV E/e' lateral: 8.4 LV SV:         58 LV SV Index:   29 LVOT Area:     2.54 cm        3D Volume EF                                LV 3D EF:    Left                                             ventricul                                             ar  ejection                                             fraction                                             by 3D                                             volume is                                             64 %.                                 3D Volume EF:                                3D EF:        64 %                                LV EDV:       109 ml                                LV ESV:       39 ml                                LV SV:        70 ml RIGHT VENTRICLE             IVC RV Basal diam:  3.30 cm     IVC diam: 1.90 cm RV S prime:     12.80 cm/s TAPSE (M-mode): 2.0 cm LEFT ATRIUM             Index        RIGHT ATRIUM           Index LA diam:        3.40 cm 1.73 cm/m   RA Area:     11.60 cm LA Vol (A2C):   49.2 ml 24.98 ml/m  RA Volume:   22.00 ml  11.17 ml/m LA Vol (A4C):   32.3 ml 16.40 ml/m LA Biplane Vol: 42.8 ml 21.73 ml/m  AORTIC VALVE AV Area (Vmax):    2.73 cm AV Area (Vmean):   2.52 cm AV Area (VTI):     2.47 cm AV Vmax:           122.00 cm/s AV Vmean:          84.000 cm/s AV VTI:            0.235 m AV Peak Grad:      6.0 mmHg AV  Mean Grad:      3.0 mmHg LVOT Vmax:         131.00 cm/s LVOT Vmean:        83.100 cm/s LVOT VTI:          0.228 m LVOT/AV VTI ratio: 0.97  AORTA Ao Root diam: 3.30 cm MITRAL VALVE               TRICUSPID VALVE MV Area (PHT): 3.76 cm    TR Peak grad:   14.3 mmHg MV Decel Time: 202 msec    TR Vmax:        189.00 cm/s MV E velocity: 62.60 cm/s MV A velocity: 84.00 cm/s  SHUNTS MV E/A ratio:  0.75        Systemic VTI:  0.23 m                            Systemic Diam: 1.80 cm Cherlynn Kaiser  MD Electronically signed by Cherlynn Kaiser MD Signature Date/Time: 11/14/2022/9:11:28 PM    Final    VAS [place] LOWER EXTREMITY VENOUS (DVT)  Result Date: 11/14/2022  Lower Venous DVT Study Patient Name:  Damon Parrish  Date of Exam:   11/14/2022 Medical Rec #: [id number]      Accession #:    [id number] Date of Birth: 06-02-1941       Patient Gender: M Patient Age:   7 years Exam Location:  [facility] Procedure:      VAS [place] LOWER EXTREMITY VENOUS (DVT) Referring Phys: Cornelius Moras XU --------------------------------------------------------------------------------  Indications: Stroke.  Risk Factors: None identified. Comparison Study: No prior studies. Performing Technologist: Oliver Hum RVT  Examination Guidelines: A complete evaluation includes B-mode imaging, spectral Doppler, color Doppler, and power Doppler as needed of all accessible portions of each vessel. Bilateral testing is considered an integral part of a complete examination. Limited examinations for reoccurring indications may be performed as noted. The reflux portion of the exam is performed with the patient in reverse Trendelenburg.  +---------+---------------+---------+-----------+----------+--------------+ RIGHT    CompressibilityPhasicitySpontaneityPropertiesThrombus Aging +---------+---------------+---------+-----------+----------+--------------+ CFV      Full           Yes      Yes                                 +---------+---------------+---------+-----------+----------+--------------+ SFJ      Full                                                        +---------+---------------+---------+-----------+----------+--------------+ FV Prox  Full                                                        +---------+---------------+---------+-----------+----------+--------------+ FV Mid   Full                                                         +---------+---------------+---------+-----------+----------+--------------+  FV DistalFull                                                        +---------+---------------+---------+-----------+----------+--------------+ PFV      Full                                                        +---------+---------------+---------+-----------+----------+--------------+ POP      Full           Yes      Yes                                 +---------+---------------+---------+-----------+----------+--------------+ PTV      Full                                                        +---------+---------------+---------+-----------+----------+--------------+ PERO     Full                                                        +---------+---------------+---------+-----------+----------+--------------+   +---------+---------------+---------+-----------+----------+--------------+ LEFT     CompressibilityPhasicitySpontaneityPropertiesThrombus Aging +---------+---------------+---------+-----------+----------+--------------+ CFV      Full           Yes      Yes                                 +---------+---------------+---------+-----------+----------+--------------+ SFJ      Full                                                        +---------+---------------+---------+-----------+----------+--------------+ FV Prox  Full                                                        +---------+---------------+---------+-----------+----------+--------------+ FV Mid   Full                                                        +---------+---------------+---------+-----------+----------+--------------+ FV DistalFull                                                        +---------+---------------+---------+-----------+----------+--------------+   PFV      Full                                                         +---------+---------------+---------+-----------+----------+--------------+ POP      Full           Yes      Yes                                 +---------+---------------+---------+-----------+----------+--------------+ PTV      Full                                                        +---------+---------------+---------+-----------+----------+--------------+ PERO     Full                                                        +---------+---------------+---------+-----------+----------+--------------+     Summary: RIGHT: - There is no evidence of deep vein thrombosis in the lower extremity.  - No cystic structure found in the popliteal fossa.  LEFT: - There is no evidence of deep vein thrombosis in the lower extremity.  - No cystic structure found in the popliteal fossa.  *See table(s) above for measurements and observations. Electronically signed by Monica Martinez MD on 11/14/2022 at 2:26:37 PM.    Final    CT CHEST WO CONTRAST  Result Date: 11/13/2022 CLINICAL DATA:  Right upper lobe lung nodule on earlier CT angiography EXAM: CT CHEST WITHOUT CONTRAST TECHNIQUE: Multidetector CT imaging of the chest was performed following the standard protocol without IV contrast. RADIATION DOSE REDUCTION: This exam was performed according to the departmental dose-optimization program which includes automated exposure control, adjustment of the mA and/or kV according to patient size and/or use of iterative reconstruction technique. COMPARISON:  11/13/2022 FINDINGS: Cardiovascular: Unenhanced imaging of the heart is unremarkable without pericardial effusion. Normal caliber of the thoracic aorta. Atherosclerosis of the aorta and coronary vasculature. Evaluation of the vascular lumen is limited without IV contrast. Mediastinum/Nodes: No enlarged mediastinal or axillary lymph nodes. Thyroid gland, trachea, and esophagus demonstrate no significant findings. Lungs/Pleura: No acute airspace disease,  effusion, or pneumothorax. Scattered areas of scarring are seen at the lung bases. Upper lobe predominant emphysema. There is a slightly spiculated subpleural right upper lobe pulmonary nodule measuring 15 x 10 x 14 mm, reference image 24/4. Upper Abdomen: No acute abnormality. Musculoskeletal: No acute or destructive bony lesions. Chronic right-sided fourth through sixth rib fractures are noted. Reconstructed images demonstrate no additional findings. IMPRESSION: 1. 13 mm suspicious right solid pulmonary nodule within the upper lobe. If the patient would be a therapy candidate should neoplasm be detected, oncology referral may be useful. Per Fleischner Society Guidelines, consider a non-contrast Chest CT at 3 months, a PET/CT, or tissue sampling. These guidelines do not apply to immunocompromised patients and patients with cancer. Follow up in patients with significant comorbidities as clinically warranted. For lung cancer screening,  adhere to Lung-RADS guidelines. Reference: Radiology. 2017; 284(1):228-43. 2. Aortic Atherosclerosis (ICD10-I70.0) and Emphysema (ICD10-J43.9). Electronically Signed   By: Randa Ngo M.D.   On: 11/13/2022 23:04   MR BRAIN WO CONTRAST  Result Date: 11/13/2022 CLINICAL DATA:  Acute neurologic deficit EXAM: MRI HEAD WITHOUT CONTRAST TECHNIQUE: Multiplanar, multiecho pulse sequences of the brain and surrounding structures were obtained without intravenous contrast. COMPARISON:  12/30/2015 FINDINGS: Brain: Small area of abnormal diffusion restriction within the right corona radiata. Chronic right subdural hematoma. No acute hemorrhage. There is multifocal hyperintense T2-weighted signal within the white matter. Generalized volume loss. The midline structures are normal. Vascular: Major flow voids are preserved. Skull and upper cervical spine: Normal calvarium and skull base. Visualized upper cervical spine and soft tissues are normal. Sinuses/Orbits:No paranasal sinus fluid levels  or advanced mucosal thickening. No mastoid or middle ear effusion. Normal orbits. IMPRESSION: 1. Small area of acute or early subacute ischemia within the right corona radiata. 2. Chronic right subdural hematoma. Electronically Signed   By: Ulyses Jarred M.D.   On: 11/13/2022 20:48   CT ANGIO HEAD NECK W WO CM (CODE STROKE)  Result Date: 11/13/2022 CLINICAL DATA:  Neuro deficit, acute, stroke suspected. Left arm numbness and weakness. EXAM: CT ANGIOGRAPHY HEAD AND NECK TECHNIQUE: Multidetector CT imaging of the head and neck was performed using the standard protocol during bolus administration of intravenous contrast. Multiplanar CT image reconstructions and MIPs were obtained to evaluate the vascular anatomy. Carotid stenosis measurements (when applicable) are obtained utilizing NASCET criteria, using the distal internal carotid diameter as the denominator. RADIATION DOSE REDUCTION: This exam was performed according to the departmental dose-optimization program which includes automated exposure control, adjustment of the mA and/or kV according to patient size and/or use of iterative reconstruction technique. CONTRAST:  75m OMNIPAQUE IOHEXOL 350 MG/ML SOLN COMPARISON:  Head CT same day FINDINGS: CTA NECK FINDINGS Aortic arch: Aortic atherosclerosis. Branching pattern is normal without origin stenosis. Right carotid system: Common carotid artery widely patent to the bifurcation. Mild calcified plaque at the carotid bifurcation but no stenosis. Cervical ICA widely patent. Left carotid system: Common carotid artery widely patent to the bifurcation. Carotid bifurcation is normal. Cervical ICA is normal. Vertebral arteries: Both vertebral artery origins are widely patent. Both vertebral arteries appear normal through the cervical region to the foramen magnum. Skeleton: Chronic cervical spondylosis.  No acute finding. Other neck: No mass or lymphadenopathy. Upper chest: Emphysema and pulmonary scarring. Old rib  fractures on the right. 11 mm nodule in the medial right upper lobe. This is new since 2017 and is worrisome for a possible lung cancer. Review of the MIP images confirms the above findings CTA HEAD FINDINGS Anterior circulation: Both internal carotid arteries are patent through the skull base and siphon regions. Ordinary siphon atherosclerosis. Carotid artery stent in the distal siphon to supraclinoid ICA on the right. No evidence of residual aneurysmal flow. The stent is patent. Flow is present in the anterior and middle cerebral arteries. No large vessel occlusion. Posterior circulation: Both vertebral arteries are patent through the foramen magnum to the basilar artery. No basilar stenosis. Superior cerebellar arteries and posterior cerebral arteries appear normal. Venous sinuses: Patent and normal. Anatomic variants: None significant. Review of the MIP images confirms the above findings IMPRESSION: 1. No acute large vessel occlusion. 2. Aortic atherosclerosis. 3. Mild atherosclerotic change at the right carotid bifurcation but no stenosis. 4. Right ICA stent in the distal siphon to supraclinoid ICA. No evidence of residual aneurysmal flow.  5. 11 mm nodule in the medial right upper lobe. This is new since 2017 and is worrisome for a possible lung cancer. Consider one of the following in 3 months for both low-risk and high-risk individuals: (a) repeat chest CT, (b) follow-up PET-CT, or (c) tissue sampling. This recommendation follows the consensus statement: Guidelines for Management of Incidental Pulmonary Nodules Detected on CT Images: From the Fleischner Society 2017; Radiology 2017; 284:228-243. Because of the presence of emphysema and pulmonary scarring, this patient is certainly high risk. Because this was not present in 2017, I think proceeding to PET scan or tissue sampling could be preferable. Aortic Atherosclerosis (ICD10-I70.0). Electronically Signed   By: Nelson Chimes M.D.   On: 11/13/2022 18:59    CT HEAD CODE STROKE WO CONTRAST  Result Date: 11/13/2022 CLINICAL DATA:  Code stroke.  Neuro deficit, acute, stroke suspected EXAM: CT HEAD WITHOUT CONTRAST TECHNIQUE: Contiguous axial images were obtained from the base of the skull through the vertex without intravenous contrast. RADIATION DOSE REDUCTION: This exam was performed according to the departmental dose-optimization program which includes automated exposure control, adjustment of the mA and/or kV according to patient size and/or use of iterative reconstruction technique. COMPARISON:  CT head January 07, 2019. FINDINGS: Brain: Mild increase in thickness of a right subdural low-attenuation collection, measuring approximately 1.1 cm in thickness. Suspected trace left subdural low-attenuation fluid collection is unchanged. No evidence of acute large vascular territory infarct, or definitely acute hemorrhage. Mild associated mass effect without midline shift. Vascular: No hyperdense vessel identified. Right ICA stent. Calcific atherosclerosis. Skull: No acute fracture. Sinuses/Orbits: Fluid in left maxillary sinus. No acute orbital findings. Other: No mastoid effusions. ASPECTS [facility] Stroke Program Early CT Score) total score (0-10 with 10 being normal): 10. IMPRESSION: 1. Mild increase in thickness of a right subdural low-attenuation collection, measuring approximately 1.1 cm in thickness. Suspected trace left subdural low-attenuation fluid collection is unchanged. 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 Mild associated mass effect without midline shift. 2. Otherwise, no evidence of acute intracranial abnormality. Code stroke imaging results were communicated on 11/13/2022 at 6:38 pm to provider Baghat via telephone, who verbally acknowledged these results. Electronically Signed   By: Margaretha Sheffield M.D.   On: 11/13/2022 18:40    ASSESSMENT: She is a very pleasant 82 years old white male with suspicious  spiculated right upper lobe lung nodule suspicious for primary bronchogenic carcinoma pending tissue diagnosis.   PLAN: I had a lengthy discussion with the patient and his wife today about his current disease stage, prognosis and treatment options. I recommended for the patient to have a PET scan for further evaluation of this pulmonary nodule and to rule out any other metastatic disease. The patient has an appointment with Dr. Valeta Harms with pulmonary medicine in 2 days for discussion of the possible bronchoscopy for tissue diagnosis. I will see him back for follow-up visit in 3 weeks for evaluation and discussion of his PET scan results as well as the biopsy if done. I doubt the patient will be surgical candidate for resection but he may benefit from SBRT to this right upper lobe lung nodule once the diagnosis is confirmed. The patient was strongly advised to quit smoking. He was advised to call immediately if he has any other concerning symptoms in the interval.  The patient voices understanding of current disease status and treatment options and is in agreement with the current care plan.  All questions were answered. The patient knows to  call the clinic with any problems, questions or concerns. We can certainly see the patient much sooner if necessary.  Thank you so much for allowing me to participate in the care of Antionette Fairy. I will continue to follow up the patient with you and assist in his care.  The total time spent in the appointment was 60 minutes.  Disclaimer: This note was dictated with voice recognition software. Similar sounding words can inadvertently be transcribed and may not be corrected upon review.   Eilleen Kempf December 02, 2022, 10:57 AM

## 2022-12-03 ENCOUNTER — Ambulatory Visit (HOSPITAL_COMMUNITY): Payer: Medicare Other

## 2022-12-03 ENCOUNTER — Encounter: Payer: Self-pay | Admitting: Neurology

## 2022-12-03 NOTE — Progress Notes (Signed)
PET scan scheduled for 3/21 @ 9:30 at [facility]. Pt's wife notified of appt day and time.

## 2022-12-03 NOTE — Telephone Encounter (Signed)
Letter faxed to Dr Lyndel Safe at [facility]. Fax [phone]. Attn [employer]. Pt wife called no answer left a voice mail that letter was faxed

## 2022-12-03 NOTE — Telephone Encounter (Signed)
Letter done, thanks.

## 2022-12-05 ENCOUNTER — Ambulatory Visit (INDEPENDENT_AMBULATORY_CARE_PROVIDER_SITE_OTHER): Payer: Medicare Other | Admitting: Pulmonary Disease

## 2022-12-05 ENCOUNTER — Encounter: Payer: Self-pay | Admitting: Pulmonary Disease

## 2022-12-05 VITALS — BP 130/80 | HR 63 | Ht 69.0 in | Wt 181.0 lb

## 2022-12-05 DIAGNOSIS — J432 Centrilobular emphysema: Secondary | ICD-10-CM | POA: Diagnosis not present

## 2022-12-05 DIAGNOSIS — R911 Solitary pulmonary nodule: Secondary | ICD-10-CM

## 2022-12-05 DIAGNOSIS — Z8673 Personal history of transient ischemic attack (TIA), and cerebral infarction without residual deficits: Secondary | ICD-10-CM

## 2022-12-05 DIAGNOSIS — Z5181 Encounter for therapeutic drug level monitoring: Secondary | ICD-10-CM

## 2022-12-05 DIAGNOSIS — Z7902 Long term (current) use of antithrombotics/antiplatelets: Secondary | ICD-10-CM

## 2022-12-05 NOTE — Patient Instructions (Addendum)
Thank you for visiting Dr. Valeta Harms at [facility]. Today we recommend the following: Orders Placed This Encounter  Procedures   Procedural/ Surgical Case Request: ROBOTIC ASSISTED NAVIGATIONAL BRONCHOSCOPY   Ambulatory referral to Pulmonology   Bronchoscopy 12/17/2022  Return in about 19 days (around 12/24/2022) for w/ [name], NP .    Please do your part to reduce the spread of COVID-19.

## 2022-12-05 NOTE — Progress Notes (Signed)
Synopsis: Referred in March 2024 for pulmonary nodule by Tobe Sos, MD  Subjective:   PATIENT ID: Damon Parrish GENDER: male DOB: Oct 25, 1940, MRN: [id number]  Chief Complaint  Patient presents with   Consult    Lung nodule    This is a 82 year old gentleman, past medical history of gastroesophageal reflux, hypertension, stroke.  He was recently hospitalized for stroke and during this workup had a CT scan of the chest which revealed a new right upper lobe pulmonary nodule.  He was started on Plavix at discharge.  Did recently followed up with neurology decreased from dual antiplatelet therapy to Plavix alone.  Had follow-up with medical oncology.  Has nuclear medicine PET scan ordered which is scheduled for next week.  His lesion is concerning for malignancy.  He is a longstanding history of smoking.  Nodule was found incidentally.  He has smoked for 60+ years.  Was discharged on Trelegy Ellipta plus albuterol.  He can continue these current inhaler regimen.    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   GERD (gastroesophageal reflux disease)    H/O cerebral aneurysm repair 2016   "put stent in"   High cholesterol    History of blood transfusion ~ 1950   "while in hospital w/pneumonia" (01/09/2018)   History of kidney stones    Hypertension    Nonruptured cerebral aneurysm    Pneumonia ~ 1950   Red man syndrome    Seizures ([place]) 12/27/2015; 12/28/2015   Stroke ([place]) 12/2015   denies residual on 01/09/2018)     Family History  Problem Relation Age of Onset   Lymphoma Mother    Dementia Mother    Aneurysm Father    Colon cancer Neg Hx      Past Surgical History:  Procedure Laterality Date   CATARACT EXTRACTION W/ INTRAOCULAR LENS  IMPLANT, BILATERAL Bilateral    COLONOSCOPY     2013 per patient: done in  [place], normal, next colonoscopy due in 10 years   EYE SURGERY Left 06/17/2022   FOOT FRACTURE SURGERY Left    "pins, etc. in there"   FRACTURE SURGERY     LOOP RECORDER INSERTION N/A 11/15/2022   Procedure: LOOP RECORDER INSERTION;  Surgeon: Melida Quitter, MD;  Location: [facility];  Service: Cardiovascular;  Laterality: N/A;   RADIOLOGY WITH ANESTHESIA N/A 05/04/2015   Procedure: Pipeline Embolization;  Surgeon: Consuella Lose, MD;  Location: [facility];  Service: Radiology;  Laterality: N/A;   RADIOLOGY WITH ANESTHESIA N/A 05/31/2015   Procedure: Pipeline Embolization;  Surgeon: Consuella Lose, MD;  Location: [facility];  Service: Radiology;  Laterality: N/A;    Social History   Socioeconomic History   Marital status: Married    Spouse name: Not on file   Number of children: Not on file   Years of education: Not on file   Highest education level: Not on file  Occupational History   Not on file  Tobacco Use   Smoking status: Every Day    Packs/day: 1.00    Years: 61.00    Additional pack years: 0.00    Total pack years: 61.00    Types: Cigarettes   Smokeless tobacco: Never   Tobacco comments:    Smokes 4 packs of cigarettes in a week. 12/05/22 Patty Sermons  Vaping Use   Vaping Use: Never used  Substance and Sexual Activity   Alcohol use: No    Alcohol/week: 0.0 standard drinks of alcohol   Drug use: No   Sexual activity: Not Currently  Other Topics Concern   Not on file  Social History Narrative   Are you right handed or left handed? Right handed    Are you currently employed ? Retired    What is your current occupation?   Do you live at home alone?no    Who lives with you? wife   What type of home do you live in: 1 story or 2 story?  1 story, ranch home 1 step        Social Determinants of Health   Financial Resource Strain: Not on file  Food Insecurity: No Food Insecurity (11/14/2022)   Hunger Vital Sign    Worried About Running Out of Food in the  Last Year: Never true    Ran Out of Food in the Last Year: Never true  Transportation Needs: No Transportation Needs (11/14/2022)   PRAPARE - Hydrologist (Medical): No    Lack of Transportation (Non-Medical): No  Physical Activity: Not on file  Stress: Not on file  Social Connections: Not on file  Intimate Partner Violence: Not At Risk (11/14/2022)   Humiliation, Afraid, Rape, and Kick questionnaire    Fear of Current or Ex-Partner: No    Emotionally Abused: No    Physically Abused: No    Sexually Abused: No     Allergies  Allergen Reactions   Cephalosporins Rash    Full body rash with systemic symptoms   Vancomycin Rash    Full body rash with systemic symptoms     Outpatient Medications Prior to Visit  Medication Sig Dispense Refill   acetaminophen (TYLENOL) 325 MG tablet Take 1-2 tablets (325-650 mg total) by mouth every 4 (four) hours as needed for mild pain.     Ascorbic Acid (VITAMIN C) 1000 MG tablet Take 1,000 mg by mouth daily.     aspirin EC 81 MG tablet Take 1 tablet (81 mg total) by mouth daily for 15 days. Swallow whole. 15 tablet 0   atorvastatin (LIPITOR) 80 MG tablet Take 1 tablet (80 mg total) by mouth at bedtime. 30 tablet 0   bimatoprost (LUMIGAN) 0.03 % ophthalmic solution Place 1 drop into the right eye at bedtime.     Brinzolamide-Brimonidine (SIMBRINZA) 1-0.2 % SUSP Place 1 drop into both eyes 2 (two) times daily.      calcium carbonate (OSCAL) 1500 (600 Ca) MG TABS tablet Take by mouth 2 (two) times daily with a meal.     Cholecalciferol (VITAMIN D-1000 MAX ST) 1000 units tablet Take 1 tablet by mouth daily.     clopidogrel (PLAVIX) 75 MG tablet Take 1 tablet (75 mg total) by mouth daily. 90 tablet 3   finasteride (PROSCAR) 5 MG tablet Take 1 tablet (5 mg total) by mouth daily. 30 tablet 0   fluticasone (FLONASE) 50 MCG/ACT nasal spray Place 2 sprays into both nostrils daily.     levETIRAcetam (KEPPRA) 500 MG tablet Take 1 tablet  (500 mg total) by mouth 2 (two) times daily. 180 tablet 3   Multiple Vitamin (MULTIVITAMIN) capsule Take 1 capsule by mouth daily.     multivitamin-lutein (OCUVITE-LUTEIN) CAPS capsule Take 1 capsule by mouth daily.     prednisoLONE acetate (PRED FORTE) 1 % ophthalmic suspension Place 1 drop into  the left eye daily.     senna-docusate (SENOKOT-S) 8.6-50 MG tablet Take 2 tablets by mouth at bedtime as needed for mild constipation.     tamsulosin (FLOMAX) 0.4 MG CAPS capsule Take 1 capsule (0.4 mg total) by mouth daily after supper.     timolol (TIMOPTIC) 0.25 % ophthalmic solution Place 1 drop into the right eye daily.     TRELEGY ELLIPTA 100-62.5-25 MCG/ACT AEPB Inhale 1 puff into the lungs daily.     vitamin E 400 UNIT capsule Take 400 Units by mouth daily.     No facility-administered medications prior to visit.    Review of Systems  Constitutional:  Negative for chills, fever, malaise/fatigue and weight loss.  HENT:  Negative for hearing loss, sore throat and tinnitus.   Eyes:  Negative for blurred vision and double vision.  Respiratory:  Positive for shortness of breath. Negative for cough, hemoptysis, sputum production, wheezing and stridor.   Cardiovascular:  Negative for chest pain, palpitations, orthopnea, leg swelling and PND.  Gastrointestinal:  Negative for abdominal pain, constipation, diarrhea, heartburn, nausea and vomiting.  Genitourinary:  Negative for dysuria, hematuria and urgency.  Musculoskeletal:  Negative for joint pain and myalgias.  Skin:  Negative for itching and rash.  Neurological:  Negative for dizziness, tingling, weakness and headaches.  Endo/Heme/Allergies:  Negative for environmental allergies. Does not bruise/bleed easily.  Psychiatric/Behavioral:  Negative for depression. The patient is not nervous/anxious and does not have insomnia.   All other systems reviewed and are negative.    Objective:  Physical Exam Vitals reviewed.  Constitutional:       General: He is not in acute distress.    Appearance: He is well-developed.  HENT:     Head: Normocephalic and atraumatic.  Eyes:     General: No scleral icterus.    Conjunctiva/sclera: Conjunctivae normal.     Pupils: Pupils are equal, round, and reactive to light.  Neck:     Vascular: No JVD.     Trachea: No tracheal deviation.  Cardiovascular:     Rate and Rhythm: Normal rate and regular rhythm.     Heart sounds: Normal heart sounds. No murmur heard. Pulmonary:     Effort: Pulmonary effort is normal. No tachypnea, accessory muscle usage or respiratory distress.     Breath sounds: No stridor. No wheezing, rhonchi or rales.     Comments: Diminished breath sounds bilaterally Abdominal:     General: There is no distension.     Palpations: Abdomen is soft.     Tenderness: There is no abdominal tenderness.  Musculoskeletal:        General: No tenderness.     Cervical back: Neck supple.  Lymphadenopathy:     Cervical: No cervical adenopathy.  Skin:    General: Skin is warm and dry.     Capillary Refill: Capillary refill takes less than 2 seconds.     Findings: No rash.  Neurological:     Mental Status: He is alert and oriented to person, place, and time.  Psychiatric:        Behavior: Behavior normal.      Vitals:   12/05/22 1043  BP: 130/80  Pulse: 63  SpO2: 98%  Weight: 181 lb (82.1 kg)  Height: '5\' 9"'$  (1.753 m)   98% on  rA BMI Readings from Last 3 Encounters:  12/05/22 26.73 kg/m  12/02/22 26.92 kg/m  11/27/22 26.70 kg/m   Wt Readings from Last 3 Encounters:  12/05/22 181 lb (82.1  kg)  12/02/22 182 lb 4.8 oz (82.7 kg)  11/27/22 180 lb 12.8 oz (82 kg)     CBC    Component Value Date/Time   WBC 7.3 12/02/2022 1036   WBC 7.2 11/18/2022 0658   RBC 4.18 (L) 12/02/2022 1036   HGB 14.5 12/02/2022 1036   HCT 41.4 12/02/2022 1036   PLT 259 12/02/2022 1036   MCV 99.0 12/02/2022 1036   MCH 34.7 (H) 12/02/2022 1036   MCHC 35.0 12/02/2022 1036   RDW 12.7  12/02/2022 1036   LYMPHSABS 2.5 12/02/2022 1036   MONOABS 0.6 12/02/2022 1036   EOSABS 0.2 12/02/2022 1036   BASOSABS 0.0 12/02/2022 1036     Chest Imaging:  CT chest 11/13/2022: 15 mm spiculated right upper lobe subpleural nodule concerning for primary malignancy. The patient's images have been independently reviewed by me.    Pulmonary functions Testing Results:     No data to display          FeNO:   Pathology:   Echocardiogram:   Heart Catheterization:     Assessment & Plan:     ICD-10-CM   1. Lung nodule  R91.1 Procedural/ Surgical Case Request: ROBOTIC ASSISTED NAVIGATIONAL BRONCHOSCOPY    Ambulatory referral to Pulmonology    2. Nodule of apex of right lung  R91.1     3. History of stroke  Z86.73     4. h/o antiplatelet therapy  Z51.81    Z79.02     5. Centrilobular emphysema ([place])  J43.2       Discussion:  This is a 82 year old gentleman longstanding history of tobacco use.  Recent stroke.  Found to have a right upper lobe medial apical lung nodule concerning for malignancy.  Plan: I have spoke with his neurologist.  We do feel that is okay to hold his Plavix prior to the procedure. Does increase his risk for stroke but would also delay his potential diagnosis of underlying lung cancer. We will hold his Plavix for 5-day washout with plan for robotic assisted navigational bronchoscopy. We talked about the risk of bleeding and pneumothorax. Patient is agreeable to proceed. Continue Trelegy and albuterol.  Bronchoscopy to be complete on 12/17/2022. Follow-up 1 week at that is afterwards. We appreciate referral from medical oncology.   Current Outpatient Medications:    acetaminophen (TYLENOL) 325 MG tablet, Take 1-2 tablets (325-650 mg total) by mouth every 4 (four) hours as needed for mild pain., Disp: , Rfl:    Ascorbic Acid (VITAMIN C) 1000 MG tablet, Take 1,000 mg by mouth daily., Disp: , Rfl:    aspirin EC 81 MG tablet, Take 1 tablet (81 mg  total) by mouth daily for 15 days. Swallow whole., Disp: 15 tablet, Rfl: 0   atorvastatin (LIPITOR) 80 MG tablet, Take 1 tablet (80 mg total) by mouth at bedtime., Disp: 30 tablet, Rfl: 0   bimatoprost (LUMIGAN) 0.03 % ophthalmic solution, Place 1 drop into the right eye at bedtime., Disp: , Rfl:    Brinzolamide-Brimonidine (SIMBRINZA) 1-0.2 % SUSP, Place 1 drop into both eyes 2 (two) times daily. , Disp: , Rfl:    calcium carbonate (OSCAL) 1500 (600 Ca) MG TABS tablet, Take by mouth 2 (two) times daily with a meal., Disp: , Rfl:    Cholecalciferol (VITAMIN D-1000 MAX ST) 1000 units tablet, Take 1 tablet by mouth daily., Disp: , Rfl:    clopidogrel (PLAVIX) 75 MG tablet, Take 1 tablet (75 mg total) by mouth daily., Disp: 90 tablet, Rfl:  3   finasteride (PROSCAR) 5 MG tablet, Take 1 tablet (5 mg total) by mouth daily., Disp: 30 tablet, Rfl: 0   fluticasone (FLONASE) 50 MCG/ACT nasal spray, Place 2 sprays into both nostrils daily., Disp: , Rfl:    levETIRAcetam (KEPPRA) 500 MG tablet, Take 1 tablet (500 mg total) by mouth 2 (two) times daily., Disp: 180 tablet, Rfl: 3   Multiple Vitamin (MULTIVITAMIN) capsule, Take 1 capsule by mouth daily., Disp: , Rfl:    multivitamin-lutein (OCUVITE-LUTEIN) CAPS capsule, Take 1 capsule by mouth daily., Disp: , Rfl:    prednisoLONE acetate (PRED FORTE) 1 % ophthalmic suspension, Place 1 drop into the left eye daily., Disp: , Rfl:    senna-docusate (SENOKOT-S) 8.6-50 MG tablet, Take 2 tablets by mouth at bedtime as needed for mild constipation., Disp: , Rfl:    tamsulosin (FLOMAX) 0.4 MG CAPS capsule, Take 1 capsule (0.4 mg total) by mouth daily after supper., Disp: , Rfl:    timolol (TIMOPTIC) 0.25 % ophthalmic solution, Place 1 drop into the right eye daily., Disp: , Rfl:    TRELEGY ELLIPTA 100-62.5-25 MCG/ACT AEPB, Inhale 1 puff into the lungs daily., Disp: , Rfl:    vitamin E 400 UNIT capsule, Take 400 Units by mouth daily., Disp: , Rfl:   I spent 62 minutes  dedicated to the care of this patient on the date of this encounter to include pre-visit review of records, face-to-face time with the patient discussing conditions above, post visit ordering of testing, clinical documentation with the electronic health record, making appropriate referrals as documented, and communicating necessary findings to members of the patients care team.   Garner Nash, DO [facility] 12/05/2022 11:28 AM

## 2022-12-05 NOTE — H&P (View-Only) (Signed)
 Synopsis: Referred in March 2024 for pulmonary nodule by Pradhan, Pradeep K, MD  Subjective:   PATIENT ID: Damon Parrish GENDER: male DOB: 11/21/1940, MRN: [id number]  Chief Complaint  Patient presents with   Consult    Lung nodule    This is a 82-year-old gentleman, past medical history of gastroesophageal reflux, hypertension, stroke.  He was recently hospitalized for stroke and during this workup had a CT scan of the chest which revealed a new right upper lobe pulmonary nodule.  He was started on Plavix at discharge.  Did recently followed up with neurology decreased from dual antiplatelet therapy to Plavix alone.  Had follow-up with medical oncology.  Has nuclear medicine PET scan ordered which is scheduled for next week.  His lesion is concerning for malignancy.  He is a longstanding history of smoking.  Nodule was found incidentally.  He has smoked for 60+ years.  Was discharged on Trelegy Ellipta plus albuterol.  He can continue these current inhaler regimen.    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   GERD (gastroesophageal reflux disease)    H/O cerebral aneurysm repair 2016   "put stent in"   High cholesterol    History of blood transfusion ~ 1950   "while in hospital w/pneumonia" (01/09/2018)   History of kidney stones    Hypertension    Nonruptured cerebral aneurysm    Pneumonia ~ 1950   Red man syndrome    Seizures (HCC) 12/27/2015; 12/28/2015   Stroke (HCC) 12/2015   denies residual on 01/09/2018)     Family History  Problem Relation Age of Onset   Lymphoma Mother    Dementia Mother    Aneurysm Father    Colon cancer Neg Hx      Past Surgical History:  Procedure Laterality Date   CATARACT EXTRACTION W/ INTRAOCULAR LENS  IMPLANT, BILATERAL Bilateral    COLONOSCOPY     2013 per patient: done in  [place], normal, next colonoscopy due in 10 years   EYE SURGERY Left 06/17/2022   FOOT FRACTURE SURGERY Left    "pins, etc. in there"   FRACTURE SURGERY     LOOP RECORDER INSERTION N/A 11/15/2022   Procedure: LOOP RECORDER INSERTION;  Surgeon: Mealor, Augustus E, MD;  Location: [facility];  Service: Cardiovascular;  Laterality: N/A;   RADIOLOGY WITH ANESTHESIA N/A 05/04/2015   Procedure: Pipeline Embolization;  Surgeon: Neelesh Nundkumar, MD;  Location: [facility];  Service: Radiology;  Laterality: N/A;   RADIOLOGY WITH ANESTHESIA N/A 05/31/2015   Procedure: Pipeline Embolization;  Surgeon: Neelesh Nundkumar, MD;  Location: [facility];  Service: Radiology;  Laterality: N/A;    Social History   Socioeconomic History   Marital status: Married    Spouse name: Not on file   Number of children: Not on file   Years of education: Not on file   Highest education level: Not on file  Occupational History   Not on file  Tobacco Use   Smoking status: Every Day    Packs/day: 1.00    Years: 61.00    Additional pack years: 0.00    Total pack years: 61.00    Types: Cigarettes   Smokeless tobacco: Never   Tobacco comments:    Smokes 4 packs of cigarettes in a week. 12/05/22 Tay    Vaping Use   Vaping Use: Never used  Substance and Sexual Activity   Alcohol use: No    Alcohol/week: 0.0 standard drinks of alcohol   Drug use: No   Sexual activity: Not Currently  Other Topics Concern   Not on file  Social History Narrative   Are you right handed or left handed? Right handed    Are you currently employed ? Retired    What is your current occupation?   Do you live at home alone?no    Who lives with you? wife   What type of home do you live in: 1 story or 2 story?  1 story, ranch home 1 step        Social Determinants of Health   Financial Resource Strain: Not on file  Food Insecurity: No Food Insecurity (11/14/2022)   Hunger Vital Sign    Worried About Running Out of Food in the  Last Year: Never true    Ran Out of Food in the Last Year: Never true  Transportation Needs: No Transportation Needs (11/14/2022)   PRAPARE - Transportation    Lack of Transportation (Medical): No    Lack of Transportation (Non-Medical): No  Physical Activity: Not on file  Stress: Not on file  Social Connections: Not on file  Intimate Partner Violence: Not At Risk (11/14/2022)   Humiliation, Afraid, Rape, and Kick questionnaire    Fear of Current or Ex-Partner: No    Emotionally Abused: No    Physically Abused: No    Sexually Abused: No     Allergies  Allergen Reactions   Cephalosporins Rash    Full body rash with systemic symptoms   Vancomycin Rash    Full body rash with systemic symptoms     Outpatient Medications Prior to Visit  Medication Sig Dispense Refill   acetaminophen (TYLENOL) 325 MG tablet Take 1-2 tablets (325-650 mg total) by mouth every 4 (four) hours as needed for mild pain.     Ascorbic Acid (VITAMIN C) 1000 MG tablet Take 1,000 mg by mouth daily.     aspirin EC 81 MG tablet Take 1 tablet (81 mg total) by mouth daily for 15 days. Swallow whole. 15 tablet 0   atorvastatin (LIPITOR) 80 MG tablet Take 1 tablet (80 mg total) by mouth at bedtime. 30 tablet 0   bimatoprost (LUMIGAN) 0.03 % ophthalmic solution Place 1 drop into the right eye at bedtime.     Brinzolamide-Brimonidine (SIMBRINZA) 1-0.2 % SUSP Place 1 drop into both eyes 2 (two) times daily.      calcium carbonate (OSCAL) 1500 (600 Ca) MG TABS tablet Take by mouth 2 (two) times daily with a meal.     Cholecalciferol (VITAMIN D-1000 MAX ST) 1000 units tablet Take 1 tablet by mouth daily.     clopidogrel (PLAVIX) 75 MG tablet Take 1 tablet (75 mg total) by mouth daily. 90 tablet 3   finasteride (PROSCAR) 5 MG tablet Take 1 tablet (5 mg total) by mouth daily. 30 tablet 0   fluticasone (FLONASE) 50 MCG/ACT nasal spray Place 2 sprays into both nostrils daily.     levETIRAcetam (KEPPRA) 500 MG tablet Take 1 tablet  (500 mg total) by mouth 2 (two) times daily. 180 tablet 3   Multiple Vitamin (MULTIVITAMIN) capsule Take 1 capsule by mouth daily.     multivitamin-lutein (OCUVITE-LUTEIN) CAPS capsule Take 1 capsule by mouth daily.     prednisoLONE acetate (PRED FORTE) 1 % ophthalmic suspension Place 1 drop into   the left eye daily.     senna-docusate (SENOKOT-S) 8.6-50 MG tablet Take 2 tablets by mouth at bedtime as needed for mild constipation.     tamsulosin (FLOMAX) 0.4 MG CAPS capsule Take 1 capsule (0.4 mg total) by mouth daily after supper.     timolol (TIMOPTIC) 0.25 % ophthalmic solution Place 1 drop into the right eye daily.     TRELEGY ELLIPTA 100-62.5-25 MCG/ACT AEPB Inhale 1 puff into the lungs daily.     vitamin E 400 UNIT capsule Take 400 Units by mouth daily.     No facility-administered medications prior to visit.    Review of Systems  Constitutional:  Negative for chills, fever, malaise/fatigue and weight loss.  HENT:  Negative for hearing loss, sore throat and tinnitus.   Eyes:  Negative for blurred vision and double vision.  Respiratory:  Positive for shortness of breath. Negative for cough, hemoptysis, sputum production, wheezing and stridor.   Cardiovascular:  Negative for chest pain, palpitations, orthopnea, leg swelling and PND.  Gastrointestinal:  Negative for abdominal pain, constipation, diarrhea, heartburn, nausea and vomiting.  Genitourinary:  Negative for dysuria, hematuria and urgency.  Musculoskeletal:  Negative for joint pain and myalgias.  Skin:  Negative for itching and rash.  Neurological:  Negative for dizziness, tingling, weakness and headaches.  Endo/Heme/Allergies:  Negative for environmental allergies. Does not bruise/bleed easily.  Psychiatric/Behavioral:  Negative for depression. The patient is not nervous/anxious and does not have insomnia.   All other systems reviewed and are negative.    Objective:  Physical Exam Vitals reviewed.  Constitutional:       General: He is not in acute distress.    Appearance: He is well-developed.  HENT:     Head: Normocephalic and atraumatic.  Eyes:     General: No scleral icterus.    Conjunctiva/sclera: Conjunctivae normal.     Pupils: Pupils are equal, round, and reactive to light.  Neck:     Vascular: No JVD.     Trachea: No tracheal deviation.  Cardiovascular:     Rate and Rhythm: Normal rate and regular rhythm.     Heart sounds: Normal heart sounds. No murmur heard. Pulmonary:     Effort: Pulmonary effort is normal. No tachypnea, accessory muscle usage or respiratory distress.     Breath sounds: No stridor. No wheezing, rhonchi or rales.     Comments: Diminished breath sounds bilaterally Abdominal:     General: There is no distension.     Palpations: Abdomen is soft.     Tenderness: There is no abdominal tenderness.  Musculoskeletal:        General: No tenderness.     Cervical back: Neck supple.  Lymphadenopathy:     Cervical: No cervical adenopathy.  Skin:    General: Skin is warm and dry.     Capillary Refill: Capillary refill takes less than 2 seconds.     Findings: No rash.  Neurological:     Mental Status: He is alert and oriented to person, place, and time.  Psychiatric:        Behavior: Behavior normal.      Vitals:   12/05/22 1043  BP: 130/80  Pulse: 63  SpO2: 98%  Weight: 181 lb (82.1 kg)  Height: 5' 9" (1.753 m)   98% on  rA BMI Readings from Last 3 Encounters:  12/05/22 26.73 kg/m  12/02/22 26.92 kg/m  11/27/22 26.70 kg/m   Wt Readings from Last 3 Encounters:  12/05/22 181 lb (82.1   kg)  12/02/22 182 lb 4.8 oz (82.7 kg)  11/27/22 180 lb 12.8 oz (82 kg)     CBC    Component Value Date/Time   WBC 7.3 12/02/2022 1036   WBC 7.2 11/18/2022 0658   RBC 4.18 (L) 12/02/2022 1036   HGB 14.5 12/02/2022 1036   HCT 41.4 12/02/2022 1036   PLT 259 12/02/2022 1036   MCV 99.0 12/02/2022 1036   MCH 34.7 (H) 12/02/2022 1036   MCHC 35.0 12/02/2022 1036   RDW 12.7  12/02/2022 1036   LYMPHSABS 2.5 12/02/2022 1036   MONOABS 0.6 12/02/2022 1036   EOSABS 0.2 12/02/2022 1036   BASOSABS 0.0 12/02/2022 1036     Chest Imaging:  CT chest 11/13/2022: 15 mm spiculated right upper lobe subpleural nodule concerning for primary malignancy. The patient's images have been independently reviewed by me.    Pulmonary functions Testing Results:     No data to display          FeNO:   Pathology:   Echocardiogram:   Heart Catheterization:     Assessment & Plan:     ICD-10-CM   1. Lung nodule  R91.1 Procedural/ Surgical Case Request: ROBOTIC ASSISTED NAVIGATIONAL BRONCHOSCOPY    Ambulatory referral to Pulmonology    2. Nodule of apex of right lung  R91.1     3. History of stroke  Z86.73     4. h/o antiplatelet therapy  Z51.81    Z79.02     5. Centrilobular emphysema (HCC)  J43.2       Discussion:  This is a 82-year-old gentleman longstanding history of tobacco use.  Recent stroke.  Found to have a right upper lobe medial apical lung nodule concerning for malignancy.  Plan: I have spoke with his neurologist.  We do feel that is okay to hold his Plavix prior to the procedure. Does increase his risk for stroke but would also delay his potential diagnosis of underlying lung cancer. We will hold his Plavix for 5-day washout with plan for robotic assisted navigational bronchoscopy. We talked about the risk of bleeding and pneumothorax. Patient is agreeable to proceed. Continue Trelegy and albuterol.  Bronchoscopy to be complete on 12/17/2022. Follow-up 1 week at that is afterwards. We appreciate referral from medical oncology.   Current Outpatient Medications:    acetaminophen (TYLENOL) 325 MG tablet, Take 1-2 tablets (325-650 mg total) by mouth every 4 (four) hours as needed for mild pain., Disp: , Rfl:    Ascorbic Acid (VITAMIN C) 1000 MG tablet, Take 1,000 mg by mouth daily., Disp: , Rfl:    aspirin EC 81 MG tablet, Take 1 tablet (81 mg  total) by mouth daily for 15 days. Swallow whole., Disp: 15 tablet, Rfl: 0   atorvastatin (LIPITOR) 80 MG tablet, Take 1 tablet (80 mg total) by mouth at bedtime., Disp: 30 tablet, Rfl: 0   bimatoprost (LUMIGAN) 0.03 % ophthalmic solution, Place 1 drop into the right eye at bedtime., Disp: , Rfl:    Brinzolamide-Brimonidine (SIMBRINZA) 1-0.2 % SUSP, Place 1 drop into both eyes 2 (two) times daily. , Disp: , Rfl:    calcium carbonate (OSCAL) 1500 (600 Ca) MG TABS tablet, Take by mouth 2 (two) times daily with a meal., Disp: , Rfl:    Cholecalciferol (VITAMIN D-1000 MAX ST) 1000 units tablet, Take 1 tablet by mouth daily., Disp: , Rfl:    clopidogrel (PLAVIX) 75 MG tablet, Take 1 tablet (75 mg total) by mouth daily., Disp: 90 tablet, Rfl:   3   finasteride (PROSCAR) 5 MG tablet, Take 1 tablet (5 mg total) by mouth daily., Disp: 30 tablet, Rfl: 0   fluticasone (FLONASE) 50 MCG/ACT nasal spray, Place 2 sprays into both nostrils daily., Disp: , Rfl:    levETIRAcetam (KEPPRA) 500 MG tablet, Take 1 tablet (500 mg total) by mouth 2 (two) times daily., Disp: 180 tablet, Rfl: 3   Multiple Vitamin (MULTIVITAMIN) capsule, Take 1 capsule by mouth daily., Disp: , Rfl:    multivitamin-lutein (OCUVITE-LUTEIN) CAPS capsule, Take 1 capsule by mouth daily., Disp: , Rfl:    prednisoLONE acetate (PRED FORTE) 1 % ophthalmic suspension, Place 1 drop into the left eye daily., Disp: , Rfl:    senna-docusate (SENOKOT-S) 8.6-50 MG tablet, Take 2 tablets by mouth at bedtime as needed for mild constipation., Disp: , Rfl:    tamsulosin (FLOMAX) 0.4 MG CAPS capsule, Take 1 capsule (0.4 mg total) by mouth daily after supper., Disp: , Rfl:    timolol (TIMOPTIC) 0.25 % ophthalmic solution, Place 1 drop into the right eye daily., Disp: , Rfl:    TRELEGY ELLIPTA 100-62.5-25 MCG/ACT AEPB, Inhale 1 puff into the lungs daily., Disp: , Rfl:    vitamin E 400 UNIT capsule, Take 400 Units by mouth daily., Disp: , Rfl:   I spent 62 minutes  dedicated to the care of this patient on the date of this encounter to include pre-visit review of records, face-to-face time with the patient discussing conditions above, post visit ordering of testing, clinical documentation with the electronic health record, making appropriate referrals as documented, and communicating necessary findings to members of the patients care team.   Dilraj Killgore L Ozzy Bohlken, DO [facility] 12/05/2022 11:28 AM    

## 2022-12-06 ENCOUNTER — Encounter (HOSPITAL_COMMUNITY): Payer: Self-pay

## 2022-12-06 ENCOUNTER — Other Ambulatory Visit: Payer: Self-pay

## 2022-12-06 ENCOUNTER — Ambulatory Visit (HOSPITAL_COMMUNITY): Payer: Medicare Other | Admitting: Occupational Therapy

## 2022-12-06 DIAGNOSIS — R911 Solitary pulmonary nodule: Secondary | ICD-10-CM

## 2022-12-09 ENCOUNTER — Encounter (HOSPITAL_COMMUNITY): Payer: Medicare Other | Admitting: Occupational Therapy

## 2022-12-09 ENCOUNTER — Ambulatory Visit (HOSPITAL_COMMUNITY): Payer: Medicare Other | Admitting: Physical Therapy

## 2022-12-09 DIAGNOSIS — R29818 Other symptoms and signs involving the nervous system: Secondary | ICD-10-CM | POA: Diagnosis not present

## 2022-12-09 DIAGNOSIS — M6281 Muscle weakness (generalized): Secondary | ICD-10-CM

## 2022-12-09 DIAGNOSIS — R262 Difficulty in walking, not elsewhere classified: Secondary | ICD-10-CM

## 2022-12-09 NOTE — Therapy (Signed)
OUTPATIENT PHYSICAL THERAPY NEURO EVALUATION   Patient Name: Damon Parrish MRN: [id number] DOB:10-21-1940, 82 y.o., male Today's Date: 12/09/2022   PCP: Tobe Sos, MD REFERRING PROVIDER: Cathlyn Parsons, PA-C  END OF SESSION:  PT End of Session - 12/09/22 1120     Visit Number 2    Number of Visits 16    Date for PT Re-Evaluation 01/21/23    Authorization Type Medicare    Progress Note Due on Visit 8    PT Start Time 1117    PT Stop Time [id number]    PT Time Calculation (min) 39 min    Activity Tolerance Patient tolerated treatment well    Behavior During Therapy WFL for tasks assessed/performed            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   GERD (gastroesophageal reflux disease)    H/O cerebral aneurysm repair 2016   "put stent in"   High cholesterol    History of blood transfusion ~ 1950   "while in hospital w/pneumonia" (01/09/2018)   History of kidney stones    Hypertension    Nonruptured cerebral aneurysm    Pneumonia ~ 1950   Red man syndrome    Seizures ([place]) 12/27/2015; 12/28/2015   Stroke ([place]) 12/2015   denies residual on 01/09/2018)   Past Surgical History:  Procedure Laterality Date   CATARACT EXTRACTION W/ INTRAOCULAR LENS  IMPLANT, BILATERAL Bilateral    COLONOSCOPY     2013 per patient: done in [place], normal, next colonoscopy due in 10 years   EYE SURGERY Left 06/17/2022   FOOT FRACTURE SURGERY Left    "pins, etc. in there"   FRACTURE SURGERY     LOOP RECORDER INSERTION N/A 11/15/2022   Procedure: LOOP RECORDER INSERTION;  Surgeon: Melida Quitter, MD;  Location: [place] [facility];  Service: Cardiovascular;  Laterality: N/A;   RADIOLOGY WITH ANESTHESIA N/A 05/04/2015   Procedure: Pipeline Embolization;  Surgeon: Consuella Lose, MD;  [facility];  Service:  Radiology;  Laterality: N/A;   RADIOLOGY WITH ANESTHESIA N/A 05/31/2015   Procedure: Pipeline Embolization;  Surgeon: Consuella Lose, MD;  Location: [place];  Service: Radiology;  Laterality: N/A;   Patient Active Problem List   Diagnosis Date Noted   Lung nodule 12/05/2022   CVA (cerebral vascular accident) ([place]) 11/14/2022   Seizure disorder ([place]) 11/13/2022   Pulmonary nodule 11/13/2022   Cough 11/13/2022   Left leg numbness    Muscle fasciculation    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[place]) 01/07/2019   Allergic reaction    DRESS syndrome    Pressure injury of skin 01/09/2018   Adverse drug reaction 01/08/2018   Dermatitis, drug-induced 01/08/2018   Discitis 12/17/2017   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   Rectal bleeding    Constipation 12/16/2017   Cerebrovascular accident (CVA) due to embolism of right middle cerebral artery ([place]) 03/06/2016   S/P cerebral aneurysm repair 03/06/2016   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   Palpitations 01/05/2016   Aneurysm, cerebral, nonruptured    Hyperlipidemia    Acute CVA (cerebrovascular accident) ([place]) 12/30/2015   Acute right MCA stroke ([place])    Focal seizure ([place]) 12/28/2015   HTN (hypertension) 12/28/2015   Hyponatremia 12/28/2015   Cerebral aneurysm 05/04/2015  ONSET DATE: 11/13/2022  REFERRING DIAG: ACUTE RIGHT MCA STROKE  THERAPY DIAG:  Difficulty walking  Muscle weakness (generalized)  Rationale for Evaluation and Treatment: Rehabilitation  SUBJECTIVE:                                                                                                                                                                                             SUBJECTIVE STATEMENT: Initial HEP was no trouble. States he "could be better".    Eval: Comes to the clinic with some unsteadiness when walking. Also c/o weakness on the L LE. Reports that the L LE feels like it's going to give way. Patient had CVA in  11/13/22. Patient was admitted to the hospital for the week (including inpatient rehab). Upon D/C patient was then sent to outpatient PT evaluation and management.  Pt accompanied by:  wife  PERTINENT HISTORY: HTN, hx of osteomyelitis of the thoracic spine, hx of L foot fx   PAIN:  Are you having pain? No  PRECAUTIONS: None  WEIGHT BEARING RESTRICTIONS: No  FALLS: Has patient fallen in last 6 months? Yes. Number of falls 1 (10/03/2022), MD already aware  LIVING ENVIRONMENT: Lives with: lives with their spouse Lives in: House/apartment Stairs: Yes: External: 1 steps; none Has following equipment at home: Single point cane, Environmental consultant - 2 wheeled, Environmental consultant - 4 wheeled, and Wheelchair (manual)  PLOF: Independent with community mobility with device [facility])  PATIENT GOALS: "get going and get moving"  OBJECTIVE:   DIAGNOSTIC FINDINGS:  MRI HEAD WITHOUT CONTRAST 11/13/2022   TECHNIQUE: Multiplanar, multiecho pulse sequences of the brain and surrounding structures were obtained without intravenous contrast.   COMPARISON:  12/30/2015   FINDINGS: Brain: Small area of abnormal diffusion restriction within the right corona radiata. Chronic right subdural hematoma. No acute hemorrhage. There is multifocal hyperintense T2-weighted signal within the white matter. Generalized volume loss. The midline structures are normal.   Vascular: Major flow voids are preserved.   Skull and upper cervical spine: Normal calvarium and skull base. Visualized upper cervical spine and soft tissues are normal.   Sinuses/Orbits:No paranasal sinus fluid levels or advanced mucosal thickening. No mastoid or middle ear effusion. Normal orbits.   IMPRESSION: 1. Small area of acute or early subacute ischemia within the right corona radiata. 2. Chronic right subdural hematoma.    COGNITION: Overall cognitive status: Within functional limits for tasks assessed   SENSATION: Light touch:  WFL  COORDINATION: Intact  MUSCLE TONE: WNL on B LE  MUSCLE LENGTH: Mild tightness on B hamstrings and gastrocsoleus complex  POSTURE: rounded shoulders and forward head  LOWER EXTREMITY ROM:  Active  Right Eval Left Eval  Hip flexion [facility]  Hip extension [facility]  Hip abduction [facility]  Hip adduction    Hip internal rotation    Hip external rotation    Knee flexion [facility] WFL  Knee extension [facility]  Ankle dorsiflexion [facility] WFL  Ankle plantarflexion [facility] WFL  Ankle inversion    Ankle eversion     (Blank rows = not tested)  LOWER EXTREMITY MMT:    MMT Right Eval Left Eval  Hip flexion 4 4+  Hip extension 3+ 3+  Hip abduction 4 4  Hip adduction    Hip internal rotation    Hip external rotation    Knee flexion 4+ 4  Knee extension 4+ 4  Ankle dorsiflexion 4+ 4+  Ankle plantarflexion 4+ 4+  Ankle inversion    Ankle eversion    (Blank rows = not tested)  BED MOBILITY:  Sit to supine Complete Independence Supine to sit Complete Independence Rolling to Right Complete Independence Rolling to Left Complete Independence  TRANSFERS: Assistive device utilized: Single point cane  Sit to stand: Modified independence Stand to sit: Modified independence  GAIT:  Distance walked: 311 ft Assistive device utilized: Single point cane Level of assistance: Modified independence Comments: B LE in ER, wide BOS  FUNCTIONAL TESTS:  5 times sit to stand: 17.83 sec 2 minute walk test: 311 ft with SPC. Tinetti POMA: 16 (balance + gait) done with SPC   TODAY'S TREATMENT:                                                                                                                              DATE:  12/09/22 Sit to stand x 10 Heel raise x 15 Toe raise x 15 Standing hip abduction 2 x 10  Semi tandem stance 3 x 20"     Step up 4 inch 2 x 10  Step taps 4 inch x20   11/26/22 Evaluation done Patient education Seated gastrocnemius stretch x 30" x 3  Mini squats with  slight UE x 3" x 10   PATIENT EDUCATION: Education details: Educated on the pathoanatomy of CVA. Educated on the goals and course of rehab. Written HEP provided and reviewed. Education on falls risk reduction at home Person educated: Patient Education method: Explanation, Demonstration, and Handouts Education comprehension: verbalized understanding and returned demonstration  HOME EXERCISE PROGRAM: Access Code: [email or web address] URL: [email or web address]  12/09/22   Sit to Stand with Arms Crossed  - 1-2 x daily - 7 x weekly - 2 sets - 10 reps - Heel Raises with Counter Support  - 1-2 x daily - 7 x weekly - 2 sets - 10 reps - Toe Raises with Counter Support  - 1-2 x daily - 7 x weekly - 2 sets - 10 reps - Standing Tandem Balance with Counter Support  - 1-2 x daily - 7 x weekly - 1 sets - 3 reps - 20 second  hold - Standing Hip Abduction with Counter Support  - 1-2 x daily - 7 x weekly - 2 sets - 10 reps   Date: 11/26/2022 Prepared by: Rexene Alberts  Exercises - Seated Calf Stretch with Strap  - 1-2 x daily - 5-7 x weekly - 3 reps - 30 hold - Mini Squat with Counter Support  - 1-2 x daily - 5-7 x weekly - 2 sets - 10 reps - 3 hold  GOALS: Goals reviewed with patient? Yes  SHORT TERM GOALS: Target date: 12/24/22   Pt will demonstrate indep in HEP to facilitate carry-over of skilled services and improve functional outcomes Goal status: INITIAL  LONG TERM GOALS: Target date: 01/21/23  Pt will decrease 5TSTS by at least 3 seconds in order to demonstrate clinically significant improvement in LE strength Baseline: 17.83 Goal status: INITIAL  2.  Pt will increase 2MWT by at least 40 ft in order to demonstrate clinically significant improvement in community ambulation Baseline: 311 ft  Goal status: INITIAL  3.  Patient will demonstrate increase in Tinetti Score by 4 points in order to demonstrate clinically significant improvement in balance and decreased risk for  falls Baseline: 16 Goal status: INITIAL  4.  Pt will demonstrate increase in LE strength to 4+/5 to facilitate ease and safety in ambulation  Baseline: 3+/5 Goal status: INITIAL  ASSESSMENT:  CLINICAL IMPRESSION: Patient tolerated session well today. Initiated there ex and progressed standing LE strength and balance. Patient does fairly well with static standing balance today. Does show slight LLE functional weakness with today's activity. Patient and wife educated on purpose and function of all added activity. Updated HEP and issued handout. Patient will continue to benefit from skilled therapy services to reduce remaining deficits and improve functional ability.    OBJECTIVE IMPAIRMENTS: Abnormal gait, difficulty walking, decreased ROM, decreased strength, and impaired flexibility.   ACTIVITY LIMITATIONS: carrying, lifting, bending, standing, and transfers  PARTICIPATION LIMITATIONS: meal prep, cleaning, laundry, and community activity  PERSONAL FACTORS: Age and Fitness are also affecting patient's functional outcome.   REHAB POTENTIAL: Good  CLINICAL DECISION MAKING: Stable/uncomplicated  EVALUATION COMPLEXITY: Low  PLAN:  PT FREQUENCY: 2x/week  PT DURATION: 8 weeks  PLANNED INTERVENTIONS: Therapeutic exercises, Therapeutic activity, Neuromuscular re-education, Balance training, Gait training, Patient/Family education, Self Care, Stair training, and Manual therapy  PLAN FOR NEXT SESSION: Progress LE strengthening and stretching as tolerated. Progress balance and gait activities. Update HEP prn.  11:56 AM, 12/09/22 Josue Hector PT DPT  Physical Therapist with [facility]  [phone]

## 2022-12-11 ENCOUNTER — Encounter (HOSPITAL_COMMUNITY): Payer: Medicare Other | Admitting: Occupational Therapy

## 2022-12-11 ENCOUNTER — Ambulatory Visit (HOSPITAL_COMMUNITY): Payer: Medicare Other | Admitting: Physical Therapy

## 2022-12-11 DIAGNOSIS — M6281 Muscle weakness (generalized): Secondary | ICD-10-CM

## 2022-12-11 DIAGNOSIS — R29818 Other symptoms and signs involving the nervous system: Secondary | ICD-10-CM | POA: Diagnosis not present

## 2022-12-11 DIAGNOSIS — R262 Difficulty in walking, not elsewhere classified: Secondary | ICD-10-CM

## 2022-12-11 NOTE — Therapy (Signed)
OUTPATIENT PHYSICAL THERAPY TREATMENT  Patient Name: Damon Parrish MRN: [id number] DOB:1941/07/28, 82 y.o., male Today's Date: 12/11/2022   PCP: Tobe Sos, MD REFERRING PROVIDER: Cathlyn Parsons, PA-C  END OF SESSION:  PT End of Session - 12/11/22 1351     Visit Number 3    Number of Visits 16    Date for PT Re-Evaluation 01/21/23    Authorization Type Medicare    Progress Note Due on Visit 8    PT Start Time 1353    PT Stop Time [id number]    PT Time Calculation (min) 38 min    Activity Tolerance Patient tolerated treatment well    Behavior During Therapy WFL for tasks assessed/performed            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   GERD (gastroesophageal reflux disease)    H/O cerebral aneurysm repair 2016   "put stent in"   High cholesterol    History of blood transfusion ~ 1950   "while in hospital w/pneumonia" (01/09/2018)   History of kidney stones    Hypertension    Nonruptured cerebral aneurysm    Pneumonia ~ 1950   Red man syndrome    Seizures ([place]) 12/27/2015; 12/28/2015   Stroke ([place]) 12/2015   denies residual on 01/09/2018)   Past Surgical History:  Procedure Laterality Date   CATARACT EXTRACTION W/ INTRAOCULAR LENS  IMPLANT, BILATERAL Bilateral    COLONOSCOPY     2013 per patient: done in [place], normal, next colonoscopy due in 10 years   EYE SURGERY Left 06/17/2022   FOOT FRACTURE SURGERY Left    "pins, etc. in there"   FRACTURE SURGERY     LOOP RECORDER INSERTION N/A 11/15/2022   Procedure: LOOP RECORDER INSERTION;  Surgeon: Melida Quitter, MD;  Location: [place] [facility];  Service: Cardiovascular;  Laterality: N/A;   RADIOLOGY WITH ANESTHESIA N/A 05/04/2015   Procedure: Pipeline Embolization;  Surgeon: Consuella Lose, MD;  [facility];  Service:  Radiology;  Laterality: N/A;   RADIOLOGY WITH ANESTHESIA N/A 05/31/2015   Procedure: Pipeline Embolization;  Surgeon: Consuella Lose, MD;  Location: [place];  Service: Radiology;  Laterality: N/A;   Patient Active Problem List   Diagnosis Date Noted   Lung nodule 12/05/2022   CVA (cerebral vascular accident) ([place]) 11/14/2022   Seizure disorder ([place]) 11/13/2022   Pulmonary nodule 11/13/2022   Cough 11/13/2022   Left leg numbness    Muscle fasciculation    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[place]) 01/07/2019   Allergic reaction    DRESS syndrome    Pressure injury of skin 01/09/2018   Adverse drug reaction 01/08/2018   Dermatitis, drug-induced 01/08/2018   Discitis 12/17/2017   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   Rectal bleeding    Constipation 12/16/2017   Cerebrovascular accident (CVA) due to embolism of right middle cerebral artery ([place]) 03/06/2016   S/P cerebral aneurysm repair 03/06/2016   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   Palpitations 01/05/2016   Aneurysm, cerebral, nonruptured    Hyperlipidemia    Acute CVA (cerebrovascular accident) ([place]) 12/30/2015   Acute right MCA stroke ([place])    Focal seizure ([place]) 12/28/2015   HTN (hypertension) 12/28/2015   Hyponatremia 12/28/2015   Cerebral aneurysm 05/04/2015  ONSET DATE: 11/13/2022  REFERRING DIAG: ACUTE RIGHT MCA STROKE  THERAPY DIAG:  Difficulty walking  Muscle weakness (generalized)  Rationale for Evaluation and Treatment: Rehabilitation  SUBJECTIVE:                                                                                                                                                                                             SUBJECTIVE STATEMENT: Pt reports no pain or issues currently.  Walking with SPC.  Eval: Comes to the clinic with some unsteadiness when walking. Also c/o weakness on the L LE. Reports that the L LE feels like it's going to give way. Patient had CVA in  11/13/22. Patient was admitted to the hospital for the week (including inpatient rehab). Upon D/C patient was then sent to outpatient PT evaluation and management.  Pt accompanied by:  wife  PERTINENT HISTORY: HTN, hx of osteomyelitis of the thoracic spine, hx of L foot fx   PAIN:  Are you having pain? No  PRECAUTIONS: None  WEIGHT BEARING RESTRICTIONS: No  FALLS: Has patient fallen in last 6 months? Yes. Number of falls 1 (10/03/2022), MD already aware  LIVING ENVIRONMENT: Lives with: lives with their spouse Lives in: House/apartment Stairs: Yes: External: 1 steps; none Has following equipment at home: Single point cane, Environmental consultant - 2 wheeled, Environmental consultant - 4 wheeled, and Wheelchair (manual)  PLOF: Independent with community mobility with device [facility])  PATIENT GOALS: "get going and get moving"  OBJECTIVE:   DIAGNOSTIC FINDINGS:  MRI HEAD WITHOUT CONTRAST 11/13/2022   TECHNIQUE: Multiplanar, multiecho pulse sequences of the brain and surrounding structures were obtained without intravenous contrast.   COMPARISON:  12/30/2015   FINDINGS: Brain: Small area of abnormal diffusion restriction within the right corona radiata. Chronic right subdural hematoma. No acute hemorrhage. There is multifocal hyperintense T2-weighted signal within the white matter. Generalized volume loss. The midline structures are normal.   Vascular: Major flow voids are preserved.   Skull and upper cervical spine: Normal calvarium and skull base. Visualized upper cervical spine and soft tissues are normal.   Sinuses/Orbits:No paranasal sinus fluid levels or advanced mucosal thickening. No mastoid or middle ear effusion. Normal orbits.   IMPRESSION: 1. Small area of acute or early subacute ischemia within the right corona radiata. 2. Chronic right subdural hematoma.    COGNITION: Overall cognitive status: Within functional limits for tasks assessed   SENSATION: Light touch:  WFL  COORDINATION: Intact  MUSCLE TONE: WNL on B LE  MUSCLE LENGTH: Mild tightness on B hamstrings and gastrocsoleus complex  POSTURE: rounded shoulders and forward head  LOWER EXTREMITY ROM:  Active  Right Eval Left Eval  Hip flexion [facility]  Hip extension [facility]  Hip abduction [facility]  Hip adduction    Hip internal rotation    Hip external rotation    Knee flexion [facility] WFL  Knee extension [facility]  Ankle dorsiflexion [facility] WFL  Ankle plantarflexion [facility] WFL  Ankle inversion    Ankle eversion     (Blank rows = not tested)  LOWER EXTREMITY MMT:    MMT Right Eval Left Eval  Hip flexion 4 4+  Hip extension 3+ 3+  Hip abduction 4 4  Hip adduction    Hip internal rotation    Hip external rotation    Knee flexion 4+ 4  Knee extension 4+ 4  Ankle dorsiflexion 4+ 4+  Ankle plantarflexion 4+ 4+  Ankle inversion    Ankle eversion    (Blank rows = not tested)  BED MOBILITY:  Sit to supine Complete Independence Supine to sit Complete Independence Rolling to Right Complete Independence Rolling to Left Complete Independence  TRANSFERS: Assistive device utilized: Single point cane  Sit to stand: Modified independence Stand to sit: Modified independence  GAIT:  Distance walked: 311 ft Assistive device utilized: Single point cane Level of assistance: Modified independence Comments: B LE in ER, wide BOS  FUNCTIONAL TESTS:  5 times sit to stand: 17.83 sec 2 minute walk test: 311 ft with SPC. Tinetti POMA: 16 (balance + gait) done with SPC   TODAY'S TREATMENT:                                                                                                                              DATE:  12/11/22 Sit to stand x 10 Standing: Heel raise x 15 Toe raise x 1 Hip abduction 2 x 10  Hip extension 2X10 Vector stance with 1 HHA 5X3" each Semi tandem stance 2 x 30" intermittent HHA Forward step ups 1 HHA 4" 10X each Lateral step ups 1 HHA 4" 10X each      12/09/22 Sit to stand x 10 Heel raise x 15 Toe raise x 15 Standing hip abduction 2 x 10  Semi tandem stance 3 x 20"     Step up 4 inch 2 x 10  Step taps 4 inch x20   11/26/22 Evaluation done Patient education Seated gastrocnemius stretch x 30" x 3  Mini squats with slight UE x 3" x 10   PATIENT EDUCATION: Education details: Educated on the pathoanatomy of CVA. Educated on the goals and course of rehab. Written HEP provided and reviewed. Education on falls risk reduction at home Person educated: Patient Education method: Explanation, Demonstration, and Handouts Education comprehension: verbalized understanding and returned demonstration  HOME EXERCISE PROGRAM: Access Code: [email or web address] URL: [email or web address]  12/09/22   Sit to Stand with Arms Crossed  - 1-2 x daily - 7 x weekly - 2 sets - 10 reps - Heel Raises with Counter Support  -  1-2 x daily - 7 x weekly - 2 sets - 10 reps - Toe Raises with Counter Support  - 1-2 x daily - 7 x weekly - 2 sets - 10 reps - Standing Tandem Balance with Counter Support  - 1-2 x daily - 7 x weekly - 1 sets - 3 reps - 20 second hold - Standing Hip Abduction with Counter Support  - 1-2 x daily - 7 x weekly - 2 sets - 10 reps   Date: 11/26/2022 Prepared by: Rexene Alberts  Exercises - Seated Calf Stretch with Strap  - 1-2 x daily - 5-7 x weekly - 3 reps - 30 hold - Mini Squat with Counter Support  - 1-2 x daily - 5-7 x weekly - 2 sets - 10 reps - 3 hold  GOALS: Goals reviewed with patient? Yes  SHORT TERM GOALS: Target date: 12/24/22   Pt will demonstrate indep in HEP to facilitate carry-over of skilled services and improve functional outcomes Goal status: INITIAL  LONG TERM GOALS: Target date: 01/21/23  Pt will decrease 5TSTS by at least 3 seconds in order to demonstrate clinically significant improvement in LE strength Baseline: 17.83 Goal status: INITIAL  2.  Pt will increase 2MWT by at least 40 ft in order to  demonstrate clinically significant improvement in community ambulation Baseline: 311 ft  Goal status: INITIAL  3.  Patient will demonstrate increase in Tinetti Score by 4 points in order to demonstrate clinically significant improvement in balance and decreased risk for falls Baseline: 16 Goal status: INITIAL  4.  Pt will demonstrate increase in LE strength to 4+/5 to facilitate ease and safety in ambulation  Baseline: 3+/5 Goal status: INITIAL  ASSESSMENT:  CLINICAL IMPRESSION: Continued with focus on improving LE strength and stability.  Pt requires max verbal and min tactile cues to maintain upright posturing and complete therex in correct form.  Pt tends to present forward with activities.  Added vectors with challenge, only able to complete 5 reps on each side due to weakness.  Extreme forward leaning with LE extension.  Pt required several seated rest breaks due to fatigue. Max of 10 seconds before needing UE to assist with establishing balance with tandem actvitity.   Pt will continue to benefit from skilled therapy services to reduce remaining deficits and improve functional ability.    OBJECTIVE IMPAIRMENTS: Abnormal gait, difficulty walking, decreased ROM, decreased strength, and impaired flexibility.   ACTIVITY LIMITATIONS: carrying, lifting, bending, standing, and transfers  PARTICIPATION LIMITATIONS: meal prep, cleaning, laundry, and community activity  PERSONAL FACTORS: Age and Fitness are also affecting patient's functional outcome.   REHAB POTENTIAL: Good  CLINICAL DECISION MAKING: Stable/uncomplicated  EVALUATION COMPLEXITY: Low  PLAN:  PT FREQUENCY: 2x/week  PT DURATION: 8 weeks  PLANNED INTERVENTIONS: Therapeutic exercises, Therapeutic activity, Neuromuscular re-education, Balance training, Gait training, Patient/Family education, Self Care, Stair training, and Manual therapy  PLAN FOR NEXT SESSION: Progress LE strengthening and stretching as tolerated.  Progress balance and gait activities. Update HEP prn.  2:40 PM, 12/11/22 Teena Irani, PTA/CLT [place] Ph: [phone]

## 2022-12-12 ENCOUNTER — Encounter (HOSPITAL_COMMUNITY)
Admission: RE | Admit: 2022-12-12 | Discharge: 2022-12-12 | Disposition: A | Discharge status: Home / Self Care | Payer: Medicare Other | Source: Ambulatory Visit | Attending: Internal Medicine | Admitting: Internal Medicine

## 2022-12-12 ENCOUNTER — Ambulatory Visit: Payer: Medicare Other

## 2022-12-12 ENCOUNTER — Ambulatory Visit (HOSPITAL_COMMUNITY)
Admission: RE | Admit: 2022-12-12 | Discharge: 2022-12-12 | Disposition: A | Discharge status: Home / Self Care | Payer: Medicare Other | Source: Ambulatory Visit | Attending: Pulmonary Disease | Admitting: Pulmonary Disease

## 2022-12-12 DIAGNOSIS — R911 Solitary pulmonary nodule: Secondary | ICD-10-CM

## 2022-12-12 MED ORDER — FLUDEOXYGLUCOSE F - 18 (FDG) INJECTION
9.6900 | Freq: Once | INTRAVENOUS | Status: AC | PRN
  Administered 2022-12-12: 9.69 via INTRAVENOUS

## 2022-12-13 ENCOUNTER — Other Ambulatory Visit: Payer: Medicare Other

## 2022-12-13 DIAGNOSIS — R911 Solitary pulmonary nodule: Secondary | ICD-10-CM

## 2022-12-15 LAB — NOVEL CORONAVIRUS, NAA: SARS-CoV-2, NAA: NOT DETECTED

## 2022-12-16 ENCOUNTER — Telehealth: Payer: Self-pay

## 2022-12-16 ENCOUNTER — Encounter (HOSPITAL_COMMUNITY): Payer: Self-pay | Admitting: Pulmonary Disease

## 2022-12-16 ENCOUNTER — Other Ambulatory Visit: Payer: Self-pay

## 2022-12-16 ENCOUNTER — Encounter (HOSPITAL_COMMUNITY): Payer: Medicare Other | Admitting: Occupational Therapy

## 2022-12-16 ENCOUNTER — Ambulatory Visit (HOSPITAL_COMMUNITY): Payer: Medicare Other

## 2022-12-16 DIAGNOSIS — M6281 Muscle weakness (generalized): Secondary | ICD-10-CM

## 2022-12-16 DIAGNOSIS — R29818 Other symptoms and signs involving the nervous system: Secondary | ICD-10-CM | POA: Diagnosis not present

## 2022-12-16 DIAGNOSIS — R262 Difficulty in walking, not elsewhere classified: Secondary | ICD-10-CM

## 2022-12-16 NOTE — Telephone Encounter (Signed)
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  Spoke with wife per DPR.  Per wife they had gotten home from church and were sitting down relaxing.  Wife states Pt complained of feeling nauseous at time of incident.  Pt is scheduled for a procedure tomorrow with anesthesia.  Advised would send ASAP for Dr. Myles Gip to review and advise.

## 2022-12-16 NOTE — Telephone Encounter (Signed)
Alert reviewed by Dr. Myles Gip.  Per Dr. 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  No action needed.  Returned call to wife.  Advised no action needed.

## 2022-12-16 NOTE — Progress Notes (Signed)
Anesthesia Chart Review:  Pt is a same day work up   Case: [id number] Date/Time: 12/17/22 1215   Procedure: ROBOTIC ASSISTED NAVIGATIONAL BRONCHOSCOPY (Right) - ION w/ CIOS   Anesthesia type: General   Diagnosis: Lung nodule [R91.1]   Pre-op diagnosis: lung nodule   [facility] / [place]   Surgeons: Garner Nash, DO       DISCUSSION: Pt is 82 years old with hx stroke (11/13/22 and 2017), seizure disorder, chronic subdural hematoma, s/p cerebral aneurysm embolization 2016, HTN, red man syndrome, 60+ year smoking history  Hospitalized 2/21-2/23/24 for acute CVA. Complicated by breakthrough seizure in seizure disorder - Keppra increased. S/p loop recorder implantation 11/15/22. Incidental pulmonary nodule found, referrals for outpt pulmonology and hem-onc placed. [facility]   Dr. Valeta Harms spoke with neurology and received ok to hold plavix 5 days before procedure.   PROVIDERS: - PCP is Tobe Sos, MD   LABS:  - CBC w/diff 12/02/22: acceptable for surgery - CMP 12/02/22: Na 134, glucose 127   IMAGES: PET scan skull to thigh [id number]:  - Hypermetabolic medial right apical lung nodule, suspicious for primary bronchogenic neoplasm. - No evidence of metastatic disease.  Super D chest 12/12/22:  - 15 x 8 mm irregular medial right apical nodule, grossly unchanged, suspicious for primary bronchogenic neoplasm. - No findings suspicious for metastatic disease. - Concurrent PET-CT has been performed and will be reported separately. - Aortic Atherosclerosis (ICD10-I70.0) and Emphysema (ICD10-J43.9).   EKG 11/13/22: NSR. LAD   CV: Echo 11/14/22:  1. Left ventricular ejection fraction, by estimation, is 60 to 65%. Left ventricular ejection fraction by 3D volume is 64 %. The left ventricle has normal function. The left ventricle has no regional wall motion abnormalities. There is mild left ventricular hypertrophy. Left ventricular  diastolic parameters are consistent with Grade I diastolic dysfunction (impaired relaxation).  2. Right ventricular systolic function is normal. The right ventricular size is mildly enlarged. There is normal pulmonary artery systolic pressure. The estimated right ventricular systolic pressure is 99991111 mmHg.  3. The mitral valve is normal in structure. No evidence of mitral valve regurgitation. No evidence of mitral stenosis.  4. The aortic valve was not well visualized. Aortic valve regurgitation is not visualized. No aortic stenosis is present.  5. The inferior vena cava is normal in size with <50% respiratory variability, suggesting right atrial pressure of 8 mmHg.   Carotid duplex 02/23/19:  - There is less than 50% stenosis in the right and left internal carotid arteries.   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   GERD (gastroesophageal reflux disease)    H/O cerebral aneurysm repair 2016   "put stent in"   High cholesterol    History of blood transfusion ~ 1950   "while in hospital w/pneumonia" (01/09/2018)   History of kidney stones    Hypertension    Nonruptured cerebral aneurysm    Pneumonia ~ 1950   Red man syndrome    Seizures ([place]) 12/27/2015; 12/28/2015   Stroke ([place]) 12/2015   denies residual on 01/09/2018)    Past Surgical History:  Procedure Laterality Date   CATARACT EXTRACTION W/ INTRAOCULAR LENS  IMPLANT, BILATERAL Bilateral    COLONOSCOPY     2013 per patient: done in [place], normal, next colonoscopy due in 10 years   EYE SURGERY Left 06/17/2022   FOOT FRACTURE SURGERY Left    "pins, etc. in there"   [place] N/A 11/15/2022   Procedure: LOOP RECORDER INSERTION;   Surgeon: Melida Quitter, MD;  Location: [place] [facility];  Service: Cardiovascular;  Laterality: N/A;   RADIOLOGY WITH ANESTHESIA N/A 05/04/2015   Procedure: Pipeline Embolization;  Surgeon: Consuella Lose, MD;  [facility];  Service: Radiology;  Laterality: N/A;   RADIOLOGY WITH ANESTHESIA N/A 05/31/2015   Procedure: Pipeline Embolization;  Surgeon: Consuella Lose, MD;  Location: [place];  Service: Radiology;  Laterality: N/A;    MEDICATIONS: No current facility-administered medications for this encounter.    acetaminophen (TYLENOL) 325 MG tablet   Ascorbic Acid (VITAMIN C) 1000 MG tablet   atorvastatin (LIPITOR) 80 MG tablet   bimatoprost (LUMIGAN) 0.03 % ophthalmic solution   Brinzolamide-Brimonidine (SIMBRINZA) 1-0.2 % SUSP   calcium carbonate (OSCAL) 1500 (600 Ca) MG TABS tablet   Cholecalciferol 125 MCG (5000 UT) TABS   clopidogrel (PLAVIX) 75 MG tablet   finasteride (PROSCAR) 5 MG tablet   fluticasone (FLONASE) 50 MCG/ACT nasal spray   levETIRAcetam (KEPPRA) 500 MG tablet   Multiple Vitamin (MULTIVITAMIN) capsule   multivitamin-lutein (OCUVITE-LUTEIN) CAPS capsule   prednisoLONE acetate (PRED FORTE) 1 % ophthalmic suspension   senna-docusate (SENOKOT-S) 8.6-50 MG tablet   tamsulosin (FLOMAX) 0.4 MG CAPS capsule   timolol (TIMOPTIC) 0.25 % ophthalmic solution   TRELEGY ELLIPTA 100-62.5-25 MCG/ACT AEPB   vitamin E 400 UNIT capsule    If no changes, I anticipate pt can proceed with surgery as scheduled.   Willeen Cass, PhD, FNP-BC [facility]/Anesthesiology Phone: [phone] 12/16/2022 1:11 PM

## 2022-12-16 NOTE — Therapy (Signed)
OUTPATIENT PHYSICAL THERAPY TREATMENT  Patient Name: Damon Parrish MRN: [id number] DOB:Dec 22, 1940, 82 y.o., male Today's Date: 12/16/2022   PCP: Tobe Sos, MD REFERRING PROVIDER: Cathlyn Parsons, PA-C  END OF SESSION:  PT End of Session - 12/16/22 1438     Visit Number 4    Number of Visits 16    Date for PT Re-Evaluation 01/21/23    Authorization Type Medicare    Progress Note Due on Visit 8    PT Start Time 1437    PT Stop Time 1515    PT Time Calculation (min) 38 min    Activity Tolerance Patient tolerated treatment well    Behavior During Therapy WFL for tasks assessed/performed            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   GERD (gastroesophageal reflux disease)    H/O cerebral aneurysm repair 2016   "put stent in"   High cholesterol    History of blood transfusion ~ 1950   "while in hospital w/pneumonia" (01/09/2018)   History of kidney stones    Hypertension    Nonruptured cerebral aneurysm    Pneumonia ~ 1950   Red man syndrome    Seizures ([place]) 12/27/2015; 12/28/2015   Stroke ([place]) 12/2015   denies residual on 01/09/2018)   Past Surgical History:  Procedure Laterality Date   CATARACT EXTRACTION W/ INTRAOCULAR LENS  IMPLANT, BILATERAL Bilateral    COLONOSCOPY     2013 per patient: done in [place], normal, next colonoscopy due in 10 years   EYE SURGERY Left 06/17/2022   FOOT FRACTURE SURGERY Left    "pins, etc. in there"   FRACTURE SURGERY     LOOP RECORDER INSERTION N/A 11/15/2022   Procedure: LOOP RECORDER INSERTION;  Surgeon: Melida Quitter, MD;  Location: [place] [facility] LAB;  Service: Cardiovascular;  Laterality: N/A;   RADIOLOGY WITH ANESTHESIA N/A 05/04/2015   Procedure: Pipeline Embolization;  Surgeon: Consuella Lose, MD;  [facility];   Service: Radiology;  Laterality: N/A;   RADIOLOGY WITH ANESTHESIA N/A 05/31/2015   Procedure: Pipeline Embolization;  Surgeon: Consuella Lose, MD;  Location: [place];  Service: Radiology;  Laterality: N/A;   Patient Active Problem List   Diagnosis Date Noted   Lung nodule 12/05/2022   CVA (cerebral vascular accident) ([place]) 11/14/2022   Seizure disorder ([place]) 11/13/2022   Pulmonary nodule 11/13/2022   Cough 11/13/2022   Left leg numbness    Muscle fasciculation    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[place]) 01/07/2019   Allergic reaction    DRESS syndrome    Pressure injury of skin 01/09/2018   Adverse drug reaction 01/08/2018   Dermatitis, drug-induced 01/08/2018   Discitis 12/17/2017   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   Rectal bleeding    Constipation 12/16/2017   Cerebrovascular accident (CVA) due to embolism of right middle cerebral artery ([place]) 03/06/2016   S/P cerebral aneurysm repair 03/06/2016   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   Palpitations 01/05/2016   Aneurysm, cerebral, nonruptured    Hyperlipidemia    Acute CVA (cerebrovascular accident) ([place]) 12/30/2015   Acute right MCA stroke ([place])    Focal seizure ([place]) 12/28/2015   HTN (hypertension) 12/28/2015   Hyponatremia 12/28/2015   Cerebral aneurysm  05/04/2015    ONSET DATE: 11/13/2022  REFERRING DIAG: ACUTE RIGHT MCA STROKE  THERAPY DIAG:  Difficulty walking  Muscle weakness (generalized)  Rationale for Evaluation and Treatment: Rehabilitation  SUBJECTIVE:                                                                                                                                                                                             SUBJECTIVE STATEMENT: No reports of pain today; no new falls.  Having a lung biopsy tomorrow   Eval: Comes to the clinic with some unsteadiness when walking. Also c/o weakness on the L LE. Reports that the L LE feels like it's going to give  way. Patient had CVA in 11/13/22. Patient was admitted to the hospital for the week (including inpatient rehab). Upon D/C patient was then sent to outpatient PT evaluation and management.  Pt accompanied by:  wife  PERTINENT HISTORY: HTN, hx of osteomyelitis of the thoracic spine, hx of L foot fx   PAIN:  Are you having pain? No  PRECAUTIONS: None  WEIGHT BEARING RESTRICTIONS: No  FALLS: Has patient fallen in last 6 months? Yes. Number of falls 1 (10/03/2022), MD already aware  LIVING ENVIRONMENT: Lives with: lives with their spouse Lives in: House/apartment Stairs: Yes: External: 1 steps; none Has following equipment at home: Single point cane, Environmental consultant - 2 wheeled, Environmental consultant - 4 wheeled, and Wheelchair (manual)  PLOF: Independent with community mobility with device [facility])  PATIENT GOALS: "get going and get moving"  OBJECTIVE:   DIAGNOSTIC FINDINGS:  MRI HEAD WITHOUT CONTRAST 11/13/2022   TECHNIQUE: Multiplanar, multiecho pulse sequences of the brain and surrounding structures were obtained without intravenous contrast.   COMPARISON:  12/30/2015   FINDINGS: Brain: Small area of abnormal diffusion restriction within the right corona radiata. Chronic right subdural hematoma. No acute hemorrhage. There is multifocal hyperintense T2-weighted signal within the white matter. Generalized volume loss. The midline structures are normal.   Vascular: Major flow voids are preserved.   Skull and upper cervical spine: Normal calvarium and skull base. Visualized upper cervical spine and soft tissues are normal.   Sinuses/Orbits:No paranasal sinus fluid levels or advanced mucosal thickening. No mastoid or middle ear effusion. Normal orbits.   IMPRESSION: 1. Small area of acute or early subacute ischemia within the right corona radiata. 2. Chronic right subdural hematoma.    COGNITION: Overall cognitive status: Within functional limits for tasks assessed   SENSATION: Light  touch: WFL  COORDINATION: Intact  MUSCLE TONE: WNL on B LE  MUSCLE LENGTH: Mild tightness on B hamstrings and gastrocsoleus complex  POSTURE: rounded shoulders and  forward head  LOWER EXTREMITY ROM:     Active  Right Eval Left Eval  Hip flexion [facility]  Hip extension [facility]  Hip abduction [facility]  Hip adduction    Hip internal rotation    Hip external rotation    Knee flexion [facility] WFL  Knee extension [facility]  Ankle dorsiflexion [facility] WFL  Ankle plantarflexion [facility] WFL  Ankle inversion    Ankle eversion     (Blank rows = not tested)  LOWER EXTREMITY MMT:    MMT Right Eval Left Eval  Hip flexion 4 4+  Hip extension 3+ 3+  Hip abduction 4 4  Hip adduction    Hip internal rotation    Hip external rotation    Knee flexion 4+ 4  Knee extension 4+ 4  Ankle dorsiflexion 4+ 4+  Ankle plantarflexion 4+ 4+  Ankle inversion    Ankle eversion    (Blank rows = not tested)  BED MOBILITY:  Sit to supine Complete Independence Supine to sit Complete Independence Rolling to Right Complete Independence Rolling to Left Complete Independence  TRANSFERS: Assistive device utilized: Single point cane  Sit to stand: Modified independence Stand to sit: Modified independence  GAIT:  Distance walked: 311 ft Assistive device utilized: Single point cane Level of assistance: Modified independence Comments: B LE in ER, wide BOS  FUNCTIONAL TESTS:  5 times sit to stand: 17.83 sec 2 minute walk test: 311 ft with SPC. Tinetti POMA: 16 (balance + gait) done with SPC   TODAY'S TREATMENT:                                                                                                                              DATE:  12/16/22 Sit to stand x 10 no UE assist from chair  Sitting: 3# LAQs 2" hold x 10 3# hip flexion x 10 RTB  hip abduction 2 x 10   Standing: Heel raises 2 x 10 Hip abduction 2 x 10 each Hip extension 2 x 10 each 4" step ups 2 x 10 leading up with the left 4"  lateral step ups 2 x 10    12/11/22 Sit to stand x 10 Standing: Heel raise x 15 Toe raise x 1 Hip abduction 2 x 10  Hip extension 2X10 Vector stance with 1 HHA 5X3" each Semi tandem stance 2 x 30" intermittent HHA Forward step ups 1 HHA 4" 10X each Lateral step ups 1 HHA 4" 10X each     12/09/22 Sit to stand x 10 Heel raise x 15 Toe raise x 15 Standing hip abduction 2 x 10  Semi tandem stance 3 x 20"     Step up 4 inch 2 x 10  Step taps 4 inch x20   11/26/22 Evaluation done Patient education Seated gastrocnemius stretch x 30" x 3  Mini squats with slight UE x 3" x 10   PATIENT EDUCATION: Education details: Educated on  the pathoanatomy of CVA. Educated on the goals and course of rehab. Written HEP provided and reviewed. Education on falls risk reduction at home Person educated: Patient Education method: Explanation, Demonstration, and Handouts Education comprehension: verbalized understanding and returned demonstration  HOME EXERCISE PROGRAM: 12/16/22 hip abduction with theraband  Access Code: [email or web address] URL: [email or web address]  12/09/22   Sit to Stand with Arms Crossed  - 1-2 x daily - 7 x weekly - 2 sets - 10 reps - Heel Raises with Counter Support  - 1-2 x daily - 7 x weekly - 2 sets - 10 reps - Toe Raises with Counter Support  - 1-2 x daily - 7 x weekly - 2 sets - 10 reps - Standing Tandem Balance with Counter Support  - 1-2 x daily - 7 x weekly - 1 sets - 3 reps - 20 second hold - Standing Hip Abduction with Counter Support  - 1-2 x daily - 7 x weekly - 2 sets - 10 reps   Date: 11/26/2022 Prepared by: Rexene Alberts  Exercises - Seated Calf Stretch with Strap  - 1-2 x daily - 5-7 x weekly - 3 reps - 30 hold - Mini Squat with Counter Support  - 1-2 x daily - 5-7 x weekly - 2 sets - 10 reps - 3 hold  GOALS: Goals reviewed with patient? Yes  SHORT TERM GOALS: Target date: 12/24/22   Pt will demonstrate indep in HEP to facilitate carry-over of  skilled services and improve functional outcomes Goal status: INITIAL  LONG TERM GOALS: Target date: 01/21/23  Pt will decrease 5TSTS by at least 3 seconds in order to demonstrate clinically significant improvement in LE strength Baseline: 17.83 Goal status: INITIAL  2.  Pt will increase 2MWT by at least 40 ft in order to demonstrate clinically significant improvement in community ambulation Baseline: 311 ft  Goal status: INITIAL  3.  Patient will demonstrate increase in Tinetti Score by 4 points in order to demonstrate clinically significant improvement in balance and decreased risk for falls Baseline: 16 Goal status: INITIAL  4.  Pt will demonstrate increase in LE strength to 4+/5 to facilitate ease and safety in ambulation  Baseline: 3+/5 Goal status: INITIAL  ASSESSMENT:  CLINICAL IMPRESSION: Continued with focus on improving LE strength and stability.  Needs cues to keep trunk straight with standing hip exercises.  Some instability noted with standing on left leg only.  Some occasional shortness of breath noted with standing exercise.  Added hip abduction with theraband to HEP; issued red thera band for home use.   Pt will continue to benefit from skilled therapy services to reduce remaining deficits and improve functional ability.    OBJECTIVE IMPAIRMENTS: Abnormal gait, difficulty walking, decreased ROM, decreased strength, and impaired flexibility.   ACTIVITY LIMITATIONS: carrying, lifting, bending, standing, and transfers  PARTICIPATION LIMITATIONS: meal prep, cleaning, laundry, and community activity  PERSONAL FACTORS: Age and Fitness are also affecting patient's functional outcome.   REHAB POTENTIAL: Good  CLINICAL DECISION MAKING: Stable/uncomplicated  EVALUATION COMPLEXITY: Low  PLAN:  PT FREQUENCY: 2x/week  PT DURATION: 8 weeks  PLANNED INTERVENTIONS: Therapeutic exercises, Therapeutic activity, Neuromuscular re-education, Balance training, Gait training,  Patient/Family education, Self Care, Stair training, and Manual therapy  PLAN FOR NEXT SESSION: Progress LE strengthening and stretching as tolerated. Progress balance and gait activities. Update HEP prn.  3:16 PM, 12/16/22 Sennie Borden Small Maritssa Haughton MPT [place] [facility] [phone]

## 2022-12-16 NOTE — Progress Notes (Signed)
PCP - Tobe Sos, MD Cardiologist - Melida Quitter, MD  Pt has slight left leg weakness Pt. Has a loop recorder  Chest x-ray - 11/13/22 EKG - 11/13/22 ECHO - 11/14/22  Blood Thinner Instructions: Plavix last dose 12/11/22 ASA last dose: 12/04/22  COVID TEST- Neg 12/13/22  Anesthesia review: Y  Patient verbally denies any shortness of breath, fever, cough and chest pain during phone call   -------------  SDW INSTRUCTIONS given:  Your procedure is scheduled on 12/17/22.  Report to [facility] Main Entrance "A" at [email or web address].M., and check in at the Admitting office.  Call this number if you have problems the morning of surgery:  [phone]   Remember:  Do not eat or drink after midnight the night before your surgery    Take these medicines the morning of surgery with A SIP OF WATER  SIMBRINZA  finasteride (PROSCAR)  fluticasone (FLONASE)  levETIRAcetam (KEPPRA) prednisoLONE acetate (PRED FORTE)  timolol (TIMOPTIC) TRELEGY ELLIPTA  acetaminophen (TYLENOL)-if needed   As of today, STOP taking any Aspirin (unless otherwise instructed by your surgeon) Aleve, Naproxen, Ibuprofen, Motrin, Advil, Goody's, BC's, all herbal medications, fish oil, and all vitamins.                      Do not wear jewelry, make up, or nail polish            Do not wear lotions, powders, perfumes/colognes, or deodorant.            Do not shave 48 hours prior to surgery.  Men may shave face and neck.            Do not bring valuables to the hospital.            [facility] is not responsible for any belongings or valuables.  Do NOT Smoke (Tobacco/Vaping) 24 hours prior to your procedure If you use a CPAP at night, you may bring all equipment for your overnight stay.   Contacts, glasses, dentures or bridgework may not be worn into surgery.      For patients admitted to the hospital, discharge time will be determined by your treatment team.   Patients discharged the day of surgery will  not be allowed to drive home, and someone needs to stay with them for 24 hours.    Special instructions:   [place]- Preparing For Surgery  Before surgery, you can play an important role. Because skin is not sterile, your skin needs to be as free of germs as possible. You can reduce the number of germs on your skin by washing with CHG (chlorahexidine gluconate) Soap before surgery.  CHG is an antiseptic cleaner which kills germs and bonds with the skin to continue killing germs even after washing.    Oral Hygiene is also important to reduce your risk of infection.  Remember - BRUSH YOUR TEETH THE MORNING OF SURGERY WITH YOUR REGULAR TOOTHPASTE  Please do not use if you have an allergy to CHG or antibacterial soaps. If your skin becomes reddened/irritated stop using the CHG.  Do not shave (including legs and underarms) for at least 48 hours prior to first CHG shower. It is OK to shave your face.  Please follow these instructions carefully.   Shower the NIGHT BEFORE SURGERY and the MORNING OF SURGERY with DIAL Soap.   Pat yourself dry with a CLEAN TOWEL.  Wear CLEAN PAJAMAS to bed the night before surgery  Place CLEAN  SHEETS on your bed the night of your first shower and DO NOT SLEEP WITH PETS.   Day of Surgery: Please shower morning of surgery  Wear Clean/Comfortable clothing the morning of surgery Do not apply any deodorants/lotions.   Remember to brush your teeth WITH YOUR REGULAR TOOTHPASTE.   Questions were answered. Patient verbalized understanding of instructions.

## 2022-12-16 NOTE — Anesthesia Preprocedure Evaluation (Signed)
Anesthesia Evaluation  Patient identified by MRN, date of birth, ID band Patient awake    Reviewed: Allergy & Precautions, NPO status , Patient's Chart, lab work & pertinent test results  History of Anesthesia Complications Negative for: history of anesthetic complications  Airway Mallampati: II  TM Distance: >3 FB Neck ROM: Full    Dental  (+) Caps, Dental Advisory Given   Pulmonary shortness of breath, COPD,  COPD inhaler, Current SmokerPatient did not abstain from smoking.   breath sounds clear to auscultation       Cardiovascular hypertension, (-) angina  Rhythm:Regular Rate:Normal  10/2022 ECHO: EF 60-65%, mild LVH, normal LVF, Grade 1 DD, normal RVF, no significant valvular abnormalities   Neuro/Psych Seizures -,  H/o cerebral aneurysm repair CVA (L sided weakness), Residual Symptoms    GI/Hepatic negative GI ROS, Neg liver ROS,,,  Endo/Other  negative endocrine ROS    Renal/GU negative Renal ROS     Musculoskeletal   Abdominal   Peds  Hematology Plavix: last dose 12/11/2022   Anesthesia Other Findings   Reproductive/Obstetrics                             Anesthesia Physical Anesthesia Plan  ASA: 3  Anesthesia Plan: General   Post-op Pain Management: Tylenol PO (pre-op)*   Induction: Intravenous  PONV Risk Score and Plan: 1 and Ondansetron and Dexamethasone  Airway Management Planned: Oral ETT  Additional Equipment: None  Intra-op Plan:   Post-operative Plan: Extubation in OR  Informed Consent: I have reviewed the patients History and Physical, chart, labs and discussed the procedure including the risks, benefits and alternatives for the proposed anesthesia with the patient or authorized representative who has indicated his/her understanding and acceptance.     Dental advisory given  Plan Discussed with: CRNA and Surgeon  Anesthesia Plan Comments: (See APP note by Joslyn Hy, FNP )       Anesthesia Quick Evaluation

## 2022-12-16 NOTE — Progress Notes (Signed)
Reviewed prior to bronchoscopy   Thanks,  BLI  Garner Nash, DO [facility] 12/16/2022 5:20 PM

## 2022-12-17 ENCOUNTER — Ambulatory Visit (HOSPITAL_COMMUNITY): Payer: Medicare Other

## 2022-12-17 ENCOUNTER — Ambulatory Visit (HOSPITAL_COMMUNITY)
Admission: RE | Admit: 2022-12-17 | Discharge: 2022-12-17 | Disposition: A | Discharge status: Home / Self Care | Payer: Medicare Other | Attending: Pulmonary Disease | Admitting: Pulmonary Disease

## 2022-12-17 ENCOUNTER — Encounter (HOSPITAL_COMMUNITY): Payer: Self-pay | Admitting: Pulmonary Disease

## 2022-12-17 ENCOUNTER — Encounter (HOSPITAL_COMMUNITY)
Admission: RE | Disposition: A | Discharge status: Home / Self Care | Payer: Self-pay | Source: Home / Self Care | Attending: Pulmonary Disease

## 2022-12-17 ENCOUNTER — Other Ambulatory Visit: Payer: Self-pay

## 2022-12-17 ENCOUNTER — Ambulatory Visit (HOSPITAL_BASED_OUTPATIENT_CLINIC_OR_DEPARTMENT_OTHER): Payer: Medicare Other | Admitting: Emergency Medicine

## 2022-12-17 ENCOUNTER — Ambulatory Visit (HOSPITAL_COMMUNITY): Payer: Medicare Other | Admitting: Emergency Medicine

## 2022-12-17 ENCOUNTER — Encounter: Payer: Medicare Other | Admitting: Physical Medicine & Rehabilitation

## 2022-12-17 DIAGNOSIS — G8194 Hemiplegia, unspecified affecting left nondominant side: Secondary | ICD-10-CM | POA: Insufficient documentation

## 2022-12-17 DIAGNOSIS — C3411 Malignant neoplasm of upper lobe, right bronchus or lung: Secondary | ICD-10-CM | POA: Insufficient documentation

## 2022-12-17 DIAGNOSIS — I10 Essential (primary) hypertension: Secondary | ICD-10-CM | POA: Insufficient documentation

## 2022-12-17 DIAGNOSIS — J432 Centrilobular emphysema: Secondary | ICD-10-CM | POA: Diagnosis not present

## 2022-12-17 DIAGNOSIS — G40909 Epilepsy, unspecified, not intractable, without status epilepticus: Secondary | ICD-10-CM | POA: Insufficient documentation

## 2022-12-17 DIAGNOSIS — R911 Solitary pulmonary nodule: Secondary | ICD-10-CM

## 2022-12-17 DIAGNOSIS — J449 Chronic obstructive pulmonary disease, unspecified: Secondary | ICD-10-CM

## 2022-12-17 DIAGNOSIS — F17210 Nicotine dependence, cigarettes, uncomplicated: Secondary | ICD-10-CM | POA: Insufficient documentation

## 2022-12-17 DIAGNOSIS — Z7902 Long term (current) use of antithrombotics/antiplatelets: Secondary | ICD-10-CM | POA: Insufficient documentation

## 2022-12-17 DIAGNOSIS — I6203 Nontraumatic chronic subdural hemorrhage: Secondary | ICD-10-CM | POA: Insufficient documentation

## 2022-12-17 DIAGNOSIS — K219 Gastro-esophageal reflux disease without esophagitis: Secondary | ICD-10-CM | POA: Insufficient documentation

## 2022-12-17 DIAGNOSIS — Z7951 Long term (current) use of inhaled steroids: Secondary | ICD-10-CM | POA: Insufficient documentation

## 2022-12-17 DIAGNOSIS — Z8679 Personal history of other diseases of the circulatory system: Secondary | ICD-10-CM | POA: Diagnosis not present

## 2022-12-17 HISTORY — PX: BRONCHIAL BIOPSY: SHX5109

## 2022-12-17 HISTORY — PX: BRONCHIAL NEEDLE ASPIRATION BIOPSY: SHX5106

## 2022-12-17 HISTORY — DX: Dyspnea, unspecified: R06.00

## 2022-12-17 SURGERY — BRONCHOSCOPY, WITH BIOPSY USING ELECTROMAGNETIC NAVIGATION
Anesthesia: General | Laterality: Right

## 2022-12-17 MED ORDER — CHLORHEXIDINE GLUCONATE 0.12 % MT SOLN: 15.0000 mL | Freq: Once | OROMUCOSAL | Status: AC

## 2022-12-17 MED ORDER — MIDAZOLAM HCL 2 MG/2ML IJ SOLN: 0.5000 mg | Freq: Once | INTRAMUSCULAR | Status: DC | PRN

## 2022-12-17 MED ORDER — ONDANSETRON HCL 4 MG/2ML IJ SOLN
INTRAMUSCULAR | Status: DC | PRN
  Administered 2022-12-17: 4 mg via INTRAVENOUS

## 2022-12-17 MED ORDER — FENTANYL CITRATE (PF) 250 MCG/5ML IJ SOLN
INTRAMUSCULAR | Status: DC | PRN
  Administered 2022-12-17 (×2): 50 ug via INTRAVENOUS

## 2022-12-17 MED ORDER — LIDOCAINE 2% (20 MG/ML) 5 ML SYRINGE
INTRAMUSCULAR | Status: DC | PRN
  Administered 2022-12-17: 20 mg via INTRAVENOUS

## 2022-12-17 MED ORDER — FENTANYL CITRATE (PF) 100 MCG/2ML IJ SOLN: 25.0000 ug | INTRAMUSCULAR | Status: DC | PRN

## 2022-12-17 MED ORDER — CHLORHEXIDINE GLUCONATE 0.12 % MT SOLN
OROMUCOSAL | Status: AC
  Administered 2022-12-17: 15 mL via OROMUCOSAL
  Filled 2022-12-17: qty 15

## 2022-12-17 MED ORDER — PROMETHAZINE HCL 25 MG/ML IJ SOLN: 6.2500 mg | INTRAMUSCULAR | Status: DC | PRN

## 2022-12-17 MED ORDER — ROCURONIUM BROMIDE 10 MG/ML (PF) SYRINGE
PREFILLED_SYRINGE | INTRAVENOUS | Status: DC | PRN
  Administered 2022-12-17: 60 mg via INTRAVENOUS

## 2022-12-17 MED ORDER — SUGAMMADEX SODIUM 200 MG/2ML IV SOLN
INTRAVENOUS | Status: DC | PRN
  Administered 2022-12-17 (×2): 100 mg via INTRAVENOUS

## 2022-12-17 MED ORDER — ACETAMINOPHEN 500 MG PO TABS
1000.0000 mg | ORAL_TABLET | Freq: Once | ORAL | Status: AC
  Administered 2022-12-17: 1000 mg via ORAL
  Filled 2022-12-17: qty 2

## 2022-12-17 MED ORDER — DEXAMETHASONE SODIUM PHOSPHATE 10 MG/ML IJ SOLN
INTRAMUSCULAR | Status: DC | PRN
  Administered 2022-12-17: 10 mg via INTRAVENOUS

## 2022-12-17 MED ORDER — OXYCODONE HCL 5 MG PO TABS: 5.0000 mg | ORAL_TABLET | Freq: Once | ORAL | Status: DC | PRN

## 2022-12-17 MED ORDER — PHENYLEPHRINE HCL-NACL 20-0.9 MG/250ML-% IV SOLN
INTRAVENOUS | Status: DC | PRN
  Administered 2022-12-17: 50 ug/min via INTRAVENOUS

## 2022-12-17 MED ORDER — OXYCODONE HCL 5 MG/5ML PO SOLN: 5.0000 mg | Freq: Once | ORAL | Status: DC | PRN

## 2022-12-17 MED ORDER — PROPOFOL 10 MG/ML IV BOLUS
INTRAVENOUS | Status: DC | PRN
  Administered 2022-12-17: 100 mg via INTRAVENOUS

## 2022-12-17 MED ORDER — LACTATED RINGERS IV SOLN: INTRAVENOUS | Status: DC

## 2022-12-17 SURGICAL SUPPLY — 1 items: superlock fiducial marker IMPLANT

## 2022-12-17 NOTE — Anesthesia Procedure Notes (Signed)
Procedure Name: Intubation Date/Time: 12/17/2022 11:11 AM  Performed by: Lance Coon, CRNAPre-anesthesia Checklist: Patient identified, Emergency Drugs available, Suction available, Patient being monitored and Timeout performed Patient Re-evaluated:Patient Re-evaluated prior to induction Oxygen Delivery Method: Circle system utilized Preoxygenation: Pre-oxygenation with 100% oxygen Induction Type: IV induction Ventilation: Mask ventilation without difficulty Laryngoscope Size: Miller and 3 Grade View: Grade I Tube type: Oral Tube size: 8.5 mm Number of attempts: 1 Airway Equipment and Method: Stylet Placement Confirmation: ETT inserted through vocal cords under direct vision, positive ETCO2 and breath sounds checked- equal and bilateral Secured at: 22 cm Tube secured with: Tape Dental Injury: Teeth and Oropharynx as per pre-operative assessment

## 2022-12-17 NOTE — Anesthesia Postprocedure Evaluation (Signed)
Anesthesia Post Note  Patient: Damon Parrish  Procedure(s) Performed: ROBOTIC ASSISTED NAVIGATIONAL BRONCHOSCOPY (Right) BRONCHIAL BIOPSIES BRONCHIAL NEEDLE ASPIRATION BIOPSIES     Patient location during evaluation: PACU Anesthesia Type: General Level of consciousness: awake and alert, patient cooperative and oriented Pain management: pain level controlled Vital Signs Assessment: post-procedure vital signs reviewed and stable Respiratory status: spontaneous breathing, nonlabored ventilation and respiratory function stable Cardiovascular status: blood pressure returned to baseline and stable Postop Assessment: no apparent nausea or vomiting Anesthetic complications: no   No notable events documented.  Last Vitals:  Vitals:   12/17/22 1215 12/17/22 1230  BP: 126/74 (!) 157/75  Pulse: 68 66  Resp: (!) 24 11  Temp:  (!) 36.1 C  SpO2: 95% 96%    Last Pain:  Vitals:   12/17/22 1230  TempSrc:   PainSc: 0-No pain                 Ellisa Devivo,E. Farrin Shadle

## 2022-12-17 NOTE — Op Note (Signed)
Video Bronchoscopy with Robotic Assisted Bronchoscopic Navigation   Date of Operation: 12/17/2022   Pre-op Diagnosis: Right upper lobe pulmonary nodule  Post-op Diagnosis: Right upper lobe pulmonary nodule  Surgeon: Garner Nash, DO   Assistants: None   Anesthesia: General endotracheal anesthesia  Operation: Flexible video fiberoptic bronchoscopy with robotic assistance and biopsies.  Estimated Blood Loss: Minimal  Complications: None  Indications and History: Damon Parrish is a 82 y.o. male with history of right upper lobe pulmonary nodule. The risks, benefits, complications, treatment options and expected outcomes were discussed with the patient.  The possibilities of pneumothorax, pneumonia, reaction to medication, pulmonary aspiration, perforation of a viscus, bleeding, failure to diagnose a condition and creating a complication requiring transfusion or operation were discussed with the patient who freely signed the consent.    Description of Procedure: The patient was seen in the Preoperative Area, was examined and was deemed appropriate to proceed.  The patient was taken to [facility] endoscopy room 3, identified as Antionette Fairy and the procedure verified as Flexible Video Fiberoptic Bronchoscopy.  A Time Out was held and the above information confirmed.   Prior to the date of the procedure a high-resolution CT scan of the chest was performed. Utilizing ION software program a virtual tracheobronchial tree was generated to allow the creation of distinct navigation pathways to the patient's parenchymal abnormalities. After being taken to the operating room general anesthesia was initiated and the patient  was orally intubated. The video fiberoptic bronchoscope was introduced via the endotracheal tube and a general inspection was performed which showed normal right and left lung anatomy, aspiration of the bilateral mainstems was completed to remove any remaining secretions. Robotic catheter  inserted into patient's endotracheal tube.   Target #1 right upper lobe lung nodule: The distinct navigation pathways prepared prior to this procedure were then utilized to navigate to patient's lesion identified on CT scan. The robotic catheter was secured into place and the vision probe was withdrawn.  Lesion location was approximated using fluoroscopy and three-dimensional cone beam CT imaging for CT-guided needle placement and peripheral targeting. Under fluoroscopic guidance transbronchial needle biopsies, and transbronchial forceps biopsies were performed to be sent for cytology and pathology.  After tissue biopsies a single fiducial was placed using the fiducial catheter wire and delivery kit under direct fluoroscopy.  At the end of the procedure a general airway inspection was performed and there was no evidence of active bleeding. The bronchoscope was removed.  The patient tolerated the procedure well. There was no significant blood loss and there were no obvious complications. A post-procedural chest x-ray is pending.  Samples Target #1: 1. Transbronchial Wang needle biopsies from right upper lobe 2. Transbronchial forceps biopsies from right upper lobe  Plans:  The patient will be discharged from the PACU to home when recovered from anesthesia and after chest x-ray is reviewed. We will review the cytology, pathology results with the patient when they become available. Outpatient followup will be with Garner Nash, DO.   Garner Nash, DO [facility] 12/17/2022 12:00 PM

## 2022-12-17 NOTE — Discharge Instructions (Signed)

## 2022-12-17 NOTE — Transfer of Care (Signed)
Immediate Anesthesia Transfer of Care Note  Patient: Damon Parrish  Procedure(s) Performed: ROBOTIC ASSISTED NAVIGATIONAL BRONCHOSCOPY (Right) BRONCHIAL BIOPSIES BRONCHIAL NEEDLE ASPIRATION BIOPSIES  Patient Location: PACU  Anesthesia Type:General  Level of Consciousness: drowsy and patient cooperative  Airway & Oxygen Therapy: Patient Spontanous Breathing and Patient connected to face mask oxygen  Post-op Assessment: Report given to RN and Post -op Vital signs reviewed and stable  Post vital signs: Reviewed and stable  Last Vitals:  Vitals Value Taken Time  BP 125/75 12/17/22 1205  Temp    Pulse 70 12/17/22 1209  Resp 17 12/17/22 1209  SpO2 96 % 12/17/22 1209  Vitals shown include unvalidated device data.  Last Pain:  Vitals:   12/17/22 1013  TempSrc:   PainSc: 0-No pain      Patients Stated Pain Goal: 0 ([id number])  Complications: No notable events documented.

## 2022-12-17 NOTE — Interval H&P Note (Signed)
History and Physical Interval Note:  12/17/2022 10:32 AM  Damon Parrish  has presented today for surgery, with the diagnosis of lung nodule.  The various methods of treatment have been discussed with the patient and family. After consideration of risks, benefits and other options for treatment, the patient has consented to  Procedure(s) with comments: ROBOTIC ASSISTED NAVIGATIONAL BRONCHOSCOPY (Right) - ION w/ CIOS as a surgical intervention.  The patient's history has been reviewed, patient examined, no change in status, stable for surgery.  I have reviewed the patient's chart and labs.  Questions were answered to the patient's satisfaction.     [place]

## 2022-12-18 ENCOUNTER — Encounter (HOSPITAL_COMMUNITY): Payer: Medicare Other | Admitting: Occupational Therapy

## 2022-12-18 LAB — CYTOLOGY - NON PAP

## 2022-12-19 ENCOUNTER — Ambulatory Visit (INDEPENDENT_AMBULATORY_CARE_PROVIDER_SITE_OTHER): Payer: Medicare Other

## 2022-12-19 DIAGNOSIS — I639 Cerebral infarction, unspecified: Secondary | ICD-10-CM

## 2022-12-19 LAB — CUP PACEART REMOTE DEVICE CHECK
Date Time Interrogation Session: 20240328231236
Implantable Pulse Generator Implant Date: 20240223

## 2022-12-22 ENCOUNTER — Encounter (HOSPITAL_COMMUNITY): Payer: Self-pay | Admitting: Pulmonary Disease

## 2022-12-23 ENCOUNTER — Inpatient Hospital Stay: Payer: Medicare Other | Attending: Internal Medicine | Admitting: Internal Medicine

## 2022-12-23 ENCOUNTER — Other Ambulatory Visit: Payer: Self-pay

## 2022-12-23 VITALS — BP 157/83 | HR 65 | Temp 98.1°F | Resp 16 | Wt 179.3 lb

## 2022-12-23 DIAGNOSIS — C3411 Malignant neoplasm of upper lobe, right bronchus or lung: Secondary | ICD-10-CM | POA: Diagnosis present

## 2022-12-23 DIAGNOSIS — C3491 Malignant neoplasm of unspecified part of right bronchus or lung: Secondary | ICD-10-CM | POA: Diagnosis not present

## 2022-12-23 NOTE — Progress Notes (Signed)
[place] [phone]   Fax:([phone]) [place], [place] [address]  DIAGNOSIS: Stage 1A (T1b, N0, M0) non-small cell lung cancer, adenocarcinoma presented with right upper lobe lung nodule diagnosed in March 2024.  PRIOR THERAPY: None  CURRENT THERAPY: Referral to cardiothoracic surgery.  INTERVAL HISTORY: Damon Parrish 82 y.o. male returns to the clinic today for follow-up visit accompanied by his wife.  The patient is feeling fine today with no concerning complaints except for the baseline fatigue and arthralgia.  He denied having any current chest pain, shortness of breath, cough or hemoptysis.  He has no nausea, vomiting, diarrhea or constipation.  He has no headache or visual changes.  He denied having any recent weight loss or night sweats.  He underwent several studies recently including PET scan as well as MRI brain and bronchoscopy by Dr. Valeta Harms.  The final pathology was consistent with adenocarcinoma.  He is here today for evaluation and discussion of his treatment options.  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  MEDICATIONS:  Current Outpatient Medications  Medication Sig Dispense Refill   acetaminophen (TYLENOL) 325 MG tablet  Take 1-2 tablets (325-650 mg total) by mouth every 4 (four) hours as needed for mild pain.     Ascorbic Acid (VITAMIN C) 1000 MG tablet Take 1,000 mg by mouth daily.     atorvastatin (LIPITOR) 80 MG tablet Take 1 tablet (80 mg total) by mouth at bedtime. 30 tablet 0   bimatoprost (LUMIGAN) 0.03 % ophthalmic solution Place 1 drop into the right eye at bedtime.     Brinzolamide-Brimonidine (SIMBRINZA) 1-0.2 % SUSP Place 1 drop into the right eye 2 (two) times daily.     calcium carbonate (OSCAL) 1500 (600 Ca) MG TABS tablet Take 600 mg of elemental calcium by mouth daily with breakfast.     Cholecalciferol 125 MCG (5000 UT) TABS Take 5,000 Units by mouth daily.     clopidogrel (PLAVIX) 75 MG tablet Take 1 tablet (75 mg total) by mouth daily. 90 tablet 3   finasteride (PROSCAR) 5 MG tablet Take 1 tablet (5 mg total) by mouth daily. 30 tablet 0   fluticasone (FLONASE) 50 MCG/ACT nasal spray Place 2 sprays into both nostrils daily.     levETIRAcetam (KEPPRA) 500 MG tablet Take 1 tablet (500 mg total) by mouth 2 (two) times daily. 180 tablet 3   Multiple Vitamin (MULTIVITAMIN) capsule Take 1 capsule by mouth daily. 50+     multivitamin-lutein (OCUVITE-LUTEIN) CAPS capsule Take 1 capsule by mouth daily.     prednisoLONE acetate (PRED FORTE) 1 % ophthalmic suspension Place 1 drop into the left  eye in the morning.     senna-docusate (SENOKOT-S) 8.6-50 MG tablet Take 2 tablets by mouth at bedtime as needed for mild constipation. (Patient taking differently: Take 1 tablet by mouth daily.)     tamsulosin (FLOMAX) 0.4 MG CAPS capsule Take 1 capsule (0.4 mg total) by mouth daily after supper.     timolol (TIMOPTIC) 0.25 % ophthalmic solution Place 1 drop into the right eye 2 (two) times daily.     TRELEGY ELLIPTA 100-62.5-25 MCG/ACT AEPB Inhale 1 puff into the lungs daily.     vitamin E 400 UNIT capsule Take 400 Units by mouth daily.     No current facility-administered medications for this visit.     SURGICAL HISTORY:  Past Surgical History:  Procedure Laterality Date   BRONCHIAL BIOPSY  12/17/2022   Procedure: BRONCHIAL BIOPSIES;  Surgeon: Garner Nash, DO;  Location: [facility];  Service: Pulmonary;;   BRONCHIAL NEEDLE ASPIRATION BIOPSY  12/17/2022   Procedure: BRONCHIAL NEEDLE ASPIRATION BIOPSIES;  Surgeon: Garner Nash, DO;  Location: [facility];  Service: Pulmonary;;   CATARACT EXTRACTION W/ INTRAOCULAR LENS  IMPLANT, BILATERAL Bilateral    COLONOSCOPY     2013 per patient: done in [place], normal, next colonoscopy due in 10 years   EYE SURGERY Left 06/17/2022   FOOT FRACTURE SURGERY Left    "pins, etc. in there"   [place] N/A 11/15/2022   Procedure: LOOP RECORDER INSERTION;  Surgeon: Melida Quitter, MD;  Location: [facility];  Service: Cardiovascular;  Laterality: N/A;   RADIOLOGY WITH ANESTHESIA N/A 05/04/2015   Procedure: Pipeline Embolization;  Surgeon: Consuella Lose, MD;  Location: [facility];  Service: Radiology;  Laterality: N/A;   RADIOLOGY WITH ANESTHESIA N/A 05/31/2015   Procedure: Pipeline Embolization;  Surgeon: Consuella Lose, MD;  Location: [facility];  Service: Radiology;  Laterality: N/A;    REVIEW OF SYSTEMS:  Constitutional: positive for fatigue Eyes: negative Ears, nose, mouth, throat, and face: negative Respiratory: negative Cardiovascular: negative Gastrointestinal: negative Genitourinary:negative Integument/breast: negative Hematologic/lymphatic: negative Musculoskeletal:positive for arthralgias Neurological: negative Behavioral/Psych: negative Endocrine: negative Allergic/Immunologic: negative   PHYSICAL EXAMINATION: General appearance: alert, cooperative, fatigued, and no distress Head: Normocephalic, without obvious abnormality, atraumatic Neck: no adenopathy, no JVD, supple, symmetrical, trachea midline, and thyroid not enlarged, symmetric, no tenderness/mass/nodules Lymph  nodes: Cervical, supraclavicular, and axillary nodes normal. Resp: clear to auscultation bilaterally Back: symmetric, no curvature. ROM normal. No CVA tenderness. Cardio: regular rate and rhythm, S1, S2 normal, no murmur, click, rub or gallop GI: soft, non-tender; bowel sounds normal; no masses,  no organomegaly Extremities: extremities normal, atraumatic, no cyanosis or edema Neurologic: Alert and oriented X 3, normal strength and tone. Normal symmetric reflexes. Normal coordination and gait  ECOG PERFORMANCE STATUS: 1 - Symptomatic but completely ambulatory  There were no vitals taken for this visit.  LABORATORY DATA: Lab Results  Component Value Date   WBC 7.3 12/02/2022   HGB 14.5 12/02/2022   HCT 41.4 12/02/2022   MCV 99.0 12/02/2022   PLT 259 12/02/2022      Chemistry      Component Value Date/Time   NA 134 (L) 12/02/2022 1036   K 4.3 12/02/2022 1036   CL 99 12/02/2022 1036   CO2 29 12/02/2022 1036   BUN 14 12/02/2022 1036   CREATININE 1.03 12/02/2022 1036      Component Value Date/Time   CALCIUM 9.4 12/02/2022 1036   ALKPHOS 110 12/02/2022 1036  AST 17 12/02/2022 1036   ALT 19 12/02/2022 1036   BILITOT 0.3 12/02/2022 1036       RADIOGRAPHIC STUDIES: CUP PACEART REMOTE DEVICE CHECK  Result Date: 12/20/2022 ILR summary report received. Battery status OK. Normal device function. No new symptom, tachy, or  brady episodes. No new AF episodes. 1 Pause episode recorded on 12/15/22, previously triaged to clinic on 12/15/22 for complete HB followed by [place] 2. Monthly summary reports and ROV/PRN. [facility].  DG Chest Port 1 View  Result Date: 12/17/2022 CLINICAL DATA:  Status post bronchoscopy with biopsy. EXAM: PORTABLE CHEST 1 VIEW COMPARISON:  January 09, 2018. FINDINGS: The heart size and mediastinal contours are within normal limits. No pneumothorax is noted. No acute pulmonary disease is noted. The visualized skeletal structures are unremarkable. IMPRESSION: No  active disease. Electronically Signed   By: Marijo Conception M.D.   On: 12/17/2022 12:30   DG C-ARM BRONCHOSCOPY  Result Date: 12/17/2022 C-ARM BRONCHOSCOPY: Fluoroscopy was utilized by the requesting physician.  No radiographic interpretation.   NM PET Image Initial (PI) Skull Base To Thigh (F-18 FDG)  Result Date: 12/14/2022 CLINICAL DATA:  Initial treatment strategy for right upper lobe pulmonary nodule. EXAM: NUCLEAR MEDICINE PET SKULL BASE TO THIGH TECHNIQUE: 9.7 mCi F-18 FDG was injected intravenously. Full-ring PET imaging was performed from the skull base to thigh after the radiotracer. CT data was obtained and used for attenuation correction and anatomic localization. Fasting blood glucose: 103 mg/dl COMPARISON:  Concurrent CT chest dated 12/12/2022. Prior CT chest dated 11/13/2022. FINDINGS: Mediastinal blood pool activity: SUV max 2.7 Liver activity: SUV max NA NECK: No hypermetabolic cervical lymphadenopathy. Incidental CT findings: None. CHEST: 15 x 8 mm medial right apical lung nodule (series 9/image 22), described on concurrent CT chest, max SUV 5.5. This is suspicious for primary bronchogenic neoplasm. No hypermetabolic thoracic lymphadenopathy. Small mediastinal nodes, likely reactive. Incidental CT findings: Moderate centrilobular and paraseptal emphysematous changes, upper lung predominant. Atherosclerotic calcifications of the aortic arch. Moderate three-vessel coronary atherosclerosis. ABDOMEN/PELVIS: No abnormal hypermetabolism in the liver, spleen, pancreas, or adrenal glands. No hypermetabolic abdominopelvic lymphadenopathy. Incidental CT findings: Tiny layering gallstone (series 5/image 150), without associated inflammatory changes. Atherosclerotic calcifications the abdominal aorta and branch vessels. Extensive left colonic diverticulosis, without evidence of diverticulitis. SKELETON: No focal hypermetabolic activity to suggest skeletal metastasis. Incidental CT findings:  Degenerative changes of the visualized thoracolumbar spine. IMPRESSION: Hypermetabolic medial right apical lung nodule, suspicious for primary bronchogenic neoplasm. No evidence of metastatic disease. Electronically Signed   By: Julian Hy M.D.   On: 12/14/2022 01:44   CT Super D Chest Wo Contrast  Result Date: 12/14/2022 CLINICAL DATA:  Right upper lobe pulmonary nodule EXAM: CT CHEST WITHOUT CONTRAST TECHNIQUE: Multidetector CT imaging of the chest was performed using thin slice collimation for electromagnetic bronchoscopy planning purposes, without intravenous contrast. RADIATION DOSE REDUCTION: This exam was performed according to the departmental dose-optimization program which includes automated exposure control, adjustment of the mA and/or kV according to patient size and/or use of iterative reconstruction technique. COMPARISON:  CT chest dated 11/13/2022 FINDINGS: Cardiovascular: Heart is normal in size.  No pericardial effusion. No evidence of thoracic aortic aneurysm. Atherosclerotic calcifications of the arch. Moderate three-vessel coronary atherosclerosis. Mediastinum/Nodes: Small mediastinal lymph nodes which do not meet pathologic CT size criteria. Visualized thyroid is unremarkable. Lungs/Pleura: 15 x 8 mm irregular medial right apical nodule (series 4/image 26), grossly unchanged. Moderate centrilobular and paraseptal emphysematous changes, upper lung predominant. Calcified granuloma in the  posterior left upper lobe (series 4/image 58), benign. No focal consolidation.  Mild bibasilar atelectasis. No pleural effusion or pneumothorax. Upper Abdomen: Visualized upper abdomen is grossly unremarkable, noting vascular calcifications. Musculoskeletal: Fusion at T9-10. Degenerative changes of the visualized thoracolumbar spine. IMPRESSION: 15 x 8 mm irregular medial right apical nodule, grossly unchanged, suspicious for primary bronchogenic neoplasm. No findings suspicious for metastatic disease.  Concurrent PET-CT has been performed and will be reported separately. Aortic Atherosclerosis (ICD10-I70.0) and Emphysema (ICD10-J43.9). Electronically Signed   By: Julian Hy M.D.   On: 12/14/2022 01:39    ASSESSMENT AND PLAN: This is a very pleasant 82 years old white male recently diagnosed with stage IA (T1b, N0, M0) non-small cell lung cancer, adenocarcinoma presented with medial right upper lobe lung nodule diagnosed in March 2024. The patient had a PET scan as well as MRI of the brain performed recently.  I personally and independently reviewed the imaging studies and discussed it with the patient.  He also had bronchoscopy by Dr. Valeta Harms that confirmed the diagnosis of adenocarcinoma. I had a lengthy discussion with the patient about his treatment options.  I explained to the patient that usually the best option is surgical resection if he is a good candidate for surgery. I will order PFTs and refer the patient to cardiothoracic surgery for evaluation of his condition. If the patient is not a good surgical candidate, we will refer him to radiation oncology for consideration of SBRT to this lesion followed by close monitoring. The patient and his wife are in agreement with the current plan. He was advised to call immediately if he has any concerning symptoms in the interval. The patient voices understanding of current disease status and treatment options and is in agreement with the current care plan.  All questions were answered. The patient knows to call the clinic with any problems, questions or concerns. We can certainly see the patient much sooner if necessary.  The total time spent in the appointment was 30 minutes.  Disclaimer: This note was dictated with voice recognition software. Similar sounding words can inadvertently be transcribed and may not be corrected upon review.

## 2022-12-24 ENCOUNTER — Encounter (HOSPITAL_COMMUNITY): Payer: Medicare Other | Admitting: Occupational Therapy

## 2022-12-24 ENCOUNTER — Ambulatory Visit (INDEPENDENT_AMBULATORY_CARE_PROVIDER_SITE_OTHER): Payer: Medicare Other | Admitting: Acute Care

## 2022-12-24 ENCOUNTER — Encounter: Payer: Self-pay | Admitting: Acute Care

## 2022-12-24 VITALS — BP 130/72 | HR 74 | Wt 182.6 lb

## 2022-12-24 DIAGNOSIS — C3491 Malignant neoplasm of unspecified part of right bronchus or lung: Secondary | ICD-10-CM | POA: Diagnosis not present

## 2022-12-24 DIAGNOSIS — F17210 Nicotine dependence, cigarettes, uncomplicated: Secondary | ICD-10-CM

## 2022-12-24 NOTE — Patient Instructions (Addendum)
It is good to see you today. Your biopsy results was positive for adenocarcinoma, Stage 1A.  Dr. Earlie Server referred you for evaluation by surgery. He ordered PFT's, which will help to determine if you are a surgical candidate. If you do not qualify for surgery, plan will be for Radiation therapy to the nodule.  I am glad you have been doing well after the procedure.  Follow up with Dr. Kipp Brood 01/10/2023 as is scheduled.  Work on quitting smoking completely.  1-800-QUIT NOW for free nicotine replacement therapy to help you quit.  Continue using Trelegy 1 puff daily. Rinse mouth after use.  Follow up with [facility] in 3 months to follow for suspected COPD. PFT's scheduled 01/03/2023>> Scheduled today in the office to ensure these are completed before seeing surgery..  Please contact office for sooner follow up if symptoms do not improve or worsen or seek emergency care

## 2022-12-24 NOTE — Progress Notes (Signed)
Navigator reached out to [employer], RT, to arrange for PFTs. Pt's name and MRN provided to RT. RT states she will contact pt to schedule PFTs.

## 2022-12-24 NOTE — Progress Notes (Addendum)
History of Present Illness Damon Parrish is a 82 y.o. male current every day smoker with past medical history of gastroesophageal reflux, hypertension, stroke. He was recently hospitalized for stroke and during this workup had a CT scan of the chest which revealed a new right upper lobe pulmonary nodule. He was referred to Dr. Tonia Brooms for evaluation of pulmonary nodule.    Synopsis 82 year old gentleman longstanding history of tobacco use. Recent stroke.During work up he was  found to have a right upper lobe medial apical lung nodule concerning for malignancy. He underwent Flexible video fiberoptic bronchoscopy with robotic assistance and biopsies on 12/17/2022. Biopsies were + for adenocarcinoma. He was seen by Dr. Shirline Frees 12/23/2022 He discussed treatment options. He explained to the patient that usually the best option is surgical resection if he is a good candidate for surgery. He ordered PFTs and referred the patient to cardiothoracic surgery for evaluation of his condition.We scheduled PFT's today . If the patient is not a good surgical candidate, he  will need to be  referred  to radiation oncology for consideration of SBRT to this lesion followed by close monitoring. PFT's are scheduled for 01/03/2023, and He is scheduled to see Dr. Cliffton Asters 01/10/2023.   DIAGNOSIS: Stage 1A (T1b, N0, M0) non-small cell lung cancer, adenocarcinoma presented with right upper lobe lung nodule diagnosed in March 2024.    12/24/2022 Pt. Presents for follow up after bronch. He states he has been doing well. He denies any hemoptysis after the procedure. He endorses a sore throat the day after the procedure, which self resolved and did not affect his appetite. No discolored secretions or fever. He is still smoking. He states about 10 per day. I have counseled him to quit entirely . I spent 3-4 minutes counseling patient to quit and providing him with smoking cessation resources. I have provided him with the 1-800-QUIT NOW  number for free nicotine replacement therapy.  He was seen by Dr. Arbutus Ped 12/23/22. PFT's have been ordered and scheduled. He will see Dr. Cliffton Asters 01/10/2023 for formal evaluation. If he is not a candidate, he will be referred to radiation oncology for SBRT Pt. And his wife are both in agreement with this plan.   Test Results: Cytology 12/17/2022 A.  RIGHT LUNG, UPPER LOBE, NODULE, FINE NEEDLE ASPIRATION AND BIOPSIES:  - Malignant  - Non-small cell carcinoma compatible with adenocarcinoma   PET scan 12/12/2022 Hypermetabolic medial right apical lung nodule, suspicious for primary bronchogenic neoplasm.   Super D PET 12/12/22 15 x 8 mm irregular medial right apical nodule, grossly unchanged, suspicious for primary bronchogenic neoplasm.   No findings suspicious for metastatic disease.  11/13/2022 CT Chest 13 mm suspicious right solid pulmonary nodule within the upper lobe. If the patient would be a therapy candidate should neoplasm be detected, oncology referral may be useful. Per Fleischner Society Guidelines, consider a non-contrast Chest CT at 3 months, a PET/CT, or tissue sampling.         Latest Ref Rng & Units 12/02/2022   10:36 AM 11/18/2022    6:58 AM 11/16/2022    6:06 AM  CBC  WBC 4.0 - 10.5 K/uL 7.3  7.2  8.2   Hemoglobin 13.0 - 17.0 g/dL 17.4  08.1  44.8   Hematocrit 39.0 - 52.0 % 41.4  42.9  40.3   Platelets 150 - 400 K/uL 259  211  211        Latest Ref Rng & Units 12/02/2022   10:36 AM  11/18/2022    6:58 AM 11/16/2022    6:06 AM  BMP  Glucose 70 - 99 mg/dL 244  010  272   BUN 8 - 23 mg/dL Creatinine 0.61 - 1.24 mg/dL 5.36  6.44  0.34   Sodium 135 - 145 mmol/L 134  130  133   Potassium 3.5 - 5.1 mmol/L 4.3  3.7  4.0   Chloride 98 - 111 mmol/L 99  101  101   CO2 22 - 32 mmol/L Calcium 8.9 - 10.3 mg/dL 9.4  8.7  8.9     BNP No results found for: "BNP"  ProBNP No results found for: "PROBNP"  PFT No results found for:  "FEV1PRE", "FEV1POST", "FVCPRE", "FVCPOST", "TLC", "DLCOUNC", "PREFEV1FVCRT", "PSTFEV1FVCRT"  CUP PACEART REMOTE DEVICE CHECK  Result Date: 12/19/2022 ILR summary report received. Battery status OK. Normal device function. No new symptom, tachy, or  brady episodes. No new AF episodes. 1 Pause episode recorded on 12/15/22, previously triaged to clinic on 12/15/22 for complete HB followed by Mobitz 2. Monthly summary reports and ROV/PRN. MC, CVRS. ILR summary report received. Battery status OK. Normal device function. No new symptom, tachy, or  brady episodes. No new AF episodes. 1 Pause episode recorded on 12/15/22, previously triaged to clinic on 12/15/22 for complete HB followed by Mobitz 2.   Monthly summary reports and ROV/PRN. MC, CVRS. ILR summary report received. Battery status OK. Normal device function. No new symptom, tachy, or  brady episodes. No new AF episodes.  1 Pause episode recorded on 12/15/22, previously triaged to clinic on 12/15/22 for complete HB followed by Mobitz 2.   Monthly summary reports and ROV/PRN. MC, CVRS.  DG Chest Port 1 View  Result Date: 12/17/2022 CLINICAL DATA:  Status post bronchoscopy with biopsy. EXAM: PORTABLE CHEST 1 VIEW COMPARISON:  January 09, 2018. FINDINGS: The heart size and mediastinal contours are within normal limits. No pneumothorax is noted. No acute pulmonary disease is noted. The visualized skeletal structures are unremarkable. IMPRESSION: No active disease. Electronically Signed   By: Lupita Raider M.D.   On: 12/17/2022 12:30   DG C-ARM BRONCHOSCOPY  Result Date: 12/17/2022 C-ARM BRONCHOSCOPY: Fluoroscopy was utilized by the requesting physician.  No radiographic interpretation.   NM PET Image Initial (PI) Skull Base To Thigh (F-18 FDG)  Result Date: 12/14/2022 CLINICAL DATA:  Initial treatment strategy for right upper lobe pulmonary nodule. EXAM: NUCLEAR MEDICINE PET SKULL BASE TO THIGH TECHNIQUE: 9.7 mCi F-18 FDG was injected intravenously.  Full-ring PET imaging was performed from the skull base to thigh after the radiotracer. CT data was obtained and used for attenuation correction and anatomic localization. Fasting blood glucose: 103 mg/dl COMPARISON:  Concurrent CT chest dated 12/12/2022. Prior CT chest dated 11/13/2022. FINDINGS: Mediastinal blood pool activity: SUV max 2.7 Liver activity: SUV max NA NECK: No hypermetabolic cervical lymphadenopathy. Incidental CT findings: None. CHEST: 15 x 8 mm medial right apical lung nodule (series 9/image 22), described on concurrent CT chest, max SUV 5.5. This is suspicious for primary bronchogenic neoplasm. No hypermetabolic thoracic lymphadenopathy. Small mediastinal nodes, likely reactive. Incidental CT findings: Moderate centrilobular and paraseptal emphysematous changes, upper lung predominant. Atherosclerotic calcifications of the aortic arch. Moderate three-vessel coronary atherosclerosis. ABDOMEN/PELVIS: No abnormal hypermetabolism in the liver, spleen, pancreas, or adrenal glands. No hypermetabolic abdominopelvic lymphadenopathy. Incidental CT findings: Tiny layering gallstone (series 5/image 150), without associated inflammatory changes. Atherosclerotic calcifications the abdominal aorta  and branch vessels. Extensive left colonic diverticulosis, without evidence of diverticulitis. SKELETON: No focal hypermetabolic activity to suggest skeletal metastasis. Incidental CT findings: Degenerative changes of the visualized thoracolumbar spine. IMPRESSION: Hypermetabolic medial right apical lung nodule, suspicious for primary bronchogenic neoplasm. No evidence of metastatic disease. Electronically Signed   By: Charline Bills M.D.   On: 12/14/2022 01:44   CT Super D Chest Wo Contrast  Result Date: 12/14/2022 CLINICAL DATA:  Right upper lobe pulmonary nodule EXAM: CT CHEST WITHOUT CONTRAST TECHNIQUE: Multidetector CT imaging of the chest was performed using thin slice collimation for electromagnetic  bronchoscopy planning purposes, without intravenous contrast. RADIATION DOSE REDUCTION: This exam was performed according to the departmental dose-optimization program which includes automated exposure control, adjustment of the mA and/or kV according to patient size and/or use of iterative reconstruction technique. COMPARISON:  CT chest dated 11/13/2022 FINDINGS: Cardiovascular: Heart is normal in size.  No pericardial effusion. No evidence of thoracic aortic aneurysm. Atherosclerotic calcifications of the arch. Moderate three-vessel coronary atherosclerosis. Mediastinum/Nodes: Small mediastinal lymph nodes which do not meet pathologic CT size criteria. Visualized thyroid is unremarkable. Lungs/Pleura: 15 x 8 mm irregular medial right apical nodule (series 4/image 26), grossly unchanged. Moderate centrilobular and paraseptal emphysematous changes, upper lung predominant. Calcified granuloma in the posterior left upper lobe (series 4/image 58), benign. No focal consolidation.  Mild bibasilar atelectasis. No pleural effusion or pneumothorax. Upper Abdomen: Visualized upper abdomen is grossly unremarkable, noting vascular calcifications. Musculoskeletal: Fusion at T9-10. Degenerative changes of the visualized thoracolumbar spine. IMPRESSION: 15 x 8 mm irregular medial right apical nodule, grossly unchanged, suspicious for primary bronchogenic neoplasm. No findings suspicious for metastatic disease. Concurrent PET-CT has been performed and will be reported separately. Aortic Atherosclerosis (ICD10-I70.0) and Emphysema (ICD10-J43.9). Electronically Signed   By: Charline Bills M.D.   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   GERD (gastroesophageal reflux disease)    H/O cerebral aneurysm repair  2016   "put stent in"   High cholesterol    History of blood transfusion ~ 1950   "while in hospital w/pneumonia" (01/09/2018)   History of kidney stones    Hypertension    Nonruptured cerebral aneurysm    Pneumonia ~ 1950   Red man syndrome    Seizures (HCC) 12/27/2015; 12/28/2015   Stroke (HCC) 12/2015   denies residual on 01/09/2018)     Social History   Tobacco Use   Smoking status: Every Day    Packs/day: 1.00    Years: 61.00    Additional pack years: 0.00    Total pack years: 61.00    Types: Cigarettes   Smokeless tobacco: Never   Tobacco comments:    Smokes 4 packs of cigarettes in a week. 12/05/22 Tay  Vaping Use   Vaping Use: Never used  Substance Use Topics   Alcohol use: No    Alcohol/week: 0.0 standard drinks of alcohol   Drug use: No    Mr.Kruzel reports that he has been smoking cigarettes. He has a 61.00 pack-year smoking history. He has never used smokeless tobacco. He reports that he does not drink alcohol and does not use drugs.  Tobacco Cessation: Ready to quit: Not Answered Counseling given: Not Answered Tobacco comments: Smokes 4  packs of cigarettes in a week. 12/05/22 Dellis Filbertay   Past surgical hx, Family hx, Social hx all reviewed.  Current Outpatient Medications on File Prior to Visit  Medication Sig   acetaminophen (TYLENOL) 325 MG tablet Take 1-2 tablets (325-650 mg total) by mouth every 4 (four) hours as needed for mild pain.   albuterol (PROVENTIL) (2.5 MG/3ML) 0.083% nebulizer solution Take 2.5 mg by nebulization every 6 (six) hours as needed for wheezing or shortness of breath.   amLODipine (NORVASC) 10 MG tablet Take 10 mg by mouth daily.   Ascorbic Acid (VITAMIN C) 1000 MG tablet Take 1,000 mg by mouth daily.   atorvastatin (LIPITOR) 80 MG tablet Take 1 tablet (80 mg total) by mouth at bedtime.   bimatoprost (LUMIGAN) 0.03 % ophthalmic solution Place 1 drop into the right eye at bedtime.   Brinzolamide-Brimonidine (SIMBRINZA) 1-0.2 % SUSP Place 1  drop into the right eye 2 (two) times daily.   calcium carbonate (OSCAL) 1500 (600 Ca) MG TABS tablet Take 600 mg of elemental calcium by mouth daily with breakfast.   Cholecalciferol 125 MCG (5000 UT) TABS Take 5,000 Units by mouth daily.   clopidogrel (PLAVIX) 75 MG tablet Take 1 tablet (75 mg total) by mouth daily.   finasteride (PROSCAR) 5 MG tablet Take 1 tablet (5 mg total) by mouth daily.   fluticasone (FLONASE) 50 MCG/ACT nasal spray Place 2 sprays into both nostrils daily.   levETIRAcetam (KEPPRA) 500 MG tablet Take 1 tablet (500 mg total) by mouth 2 (two) times daily.   Multiple Vitamin (MULTIVITAMIN) capsule Take 1 capsule by mouth daily. 50+   multivitamin-lutein (OCUVITE-LUTEIN) CAPS capsule Take 1 capsule by mouth daily.   prednisoLONE acetate (PRED FORTE) 1 % ophthalmic suspension Place 1 drop into the left eye in the morning.   senna-docusate (SENOKOT-S) 8.6-50 MG tablet Take 2 tablets by mouth at bedtime as needed for mild constipation. (Patient taking differently: Take 1 tablet by mouth daily.)   tamsulosin (FLOMAX) 0.4 MG CAPS capsule Take 1 capsule (0.4 mg total) by mouth daily after supper.   timolol (TIMOPTIC) 0.25 % ophthalmic solution Place 1 drop into the right eye 2 (two) times daily.   TRELEGY ELLIPTA 100-62.5-25 MCG/ACT AEPB Inhale 1 puff into the lungs daily.   vitamin E 400 UNIT capsule Take 400 Units by mouth daily.   No current facility-administered medications on file prior to visit.     Allergies  Allergen Reactions   Cephalosporins Rash    Full body rash with systemic symptoms   Vancomycin Rash    Full body rash with systemic symptoms    Review Of Systems:  Constitutional:   No  weight loss, night sweats,  Fevers, chills, fatigue, or  lassitude.  HEENT:   No headaches,  Difficulty swallowing,  Tooth/dental problems, or  Sore throat,                No sneezing, itching, ear ache, nasal congestion, post nasal drip,   CV:  No chest pain,  Orthopnea,  PND, swelling in lower extremities, anasarca, dizziness, palpitations, syncope.   GI  No heartburn, indigestion, abdominal pain, nausea, vomiting, diarrhea, change in bowel habits, loss of appetite, bloody stools.   Resp: No shortness of breath with exertion or at rest.  No excess mucus, no productive cough,  No non-productive cough,  No coughing up of blood.  No change in color of mucus.  No wheezing.  No chest wall deformity  Skin: no rash or  lesions.  GU: no dysuria, change in color of urine, no urgency or frequency.  No flank pain, no hematuria   MS:  No joint pain or swelling.  No decreased range of motion.  No back pain.  Psych:  No change in mood or affect. No depression or anxiety.  No memory loss.   Vital Signs BP 130/72 (BP Location: Left Arm, Patient Position: Sitting, Cuff Size: Normal)   Pulse 74   Wt 182 lb 9.6 oz (82.8 kg)   SpO2 97%   BMI 26.97 kg/m    Physical Exam:  General- No distress,  A&Ox3, pleasant ENT: No sinus tenderness, TM clear, pale nasal mucosa, no oral exudate,no post nasal drip, no LAN Cardiac: S1, S2, regular rate and rhythm, no murmur Chest: No wheeze/ rales/ dullness; no accessory muscle use, no nasal flaring, no sternal retractions Abd.: Soft Non-tender, ND, BS +. Body mass index is 26.97 kg/m.  Ext: No clubbing cyanosis, edema Neuro:  Physical deconditioning, walks with a cane, MAE x 4, A&O x 3 Skin: No rashes, warm and dry, no lesions  Psych: normal mood and behavior   Assessment/Plan Stage 1A (T1b, N0, M0) non-small cell lung cancer, adenocarcinoma presented with right upper lobe lung nodule diagnosed in March 2024.  Plan Your biopsy results was positive for adenocarcinoma, Stage 1A.  Dr. Shirline Frees referred you for evaluation by surgery. He ordered PFT's, which will help to determine if you are a surgical candidate. If you do not qualify for surgery, plan will be for Radiation therapy to the nodule.  I am glad you have been doing  well after the procedure.  Follow up with Dr. Cliffton Asters 01/10/2023 as is scheduled.  Work on quitting smoking completely.  1-800-QUIT NOW for free nicotine replacement therapy to help you quit.  Continue using Trelegy 1 puff daily. Rinse mouth after use.  Follow up with [facility] to follow for suspected COPD. PFT's have been scheduled 01/03/2023 Please contact office for sooner follow up if symptoms do not improve or worsen or seek emergency care    I spent 35 minutes dedicated to the care of this patient on the date of this encounter to include pre-visit review of records, face-to-face time with the patient discussing conditions above, post visit ordering of testing, clinical documentation with the electronic health record, making appropriate referrals as documented, and communicating necessary information to the patient's healthcare team.    Bevelyn Ngo, NP 12/24/2022  9:38 AM

## 2022-12-25 ENCOUNTER — Encounter: Payer: Self-pay | Admitting: Internal Medicine

## 2022-12-25 ENCOUNTER — Ambulatory Visit (INDEPENDENT_AMBULATORY_CARE_PROVIDER_SITE_OTHER): Payer: Medicare Other | Admitting: Internal Medicine

## 2022-12-25 VITALS — BP 131/77 | HR 66 | Ht 69.0 in | Wt 181.4 lb

## 2022-12-25 DIAGNOSIS — H401133 Primary open-angle glaucoma, bilateral, severe stage: Secondary | ICD-10-CM

## 2022-12-25 DIAGNOSIS — R7303 Prediabetes: Secondary | ICD-10-CM

## 2022-12-25 DIAGNOSIS — C3491 Malignant neoplasm of unspecified part of right bronchus or lung: Secondary | ICD-10-CM | POA: Diagnosis not present

## 2022-12-25 DIAGNOSIS — I639 Cerebral infarction, unspecified: Secondary | ICD-10-CM | POA: Diagnosis not present

## 2022-12-25 DIAGNOSIS — Z720 Tobacco use: Secondary | ICD-10-CM

## 2022-12-25 DIAGNOSIS — I10 Essential (primary) hypertension: Secondary | ICD-10-CM

## 2022-12-25 DIAGNOSIS — Z0001 Encounter for general adult medical examination with abnormal findings: Secondary | ICD-10-CM

## 2022-12-25 DIAGNOSIS — G40909 Epilepsy, unspecified, not intractable, without status epilepticus: Secondary | ICD-10-CM | POA: Diagnosis not present

## 2022-12-25 DIAGNOSIS — N400 Enlarged prostate without lower urinary tract symptoms: Secondary | ICD-10-CM

## 2022-12-25 DIAGNOSIS — J439 Emphysema, unspecified: Secondary | ICD-10-CM

## 2022-12-25 MED ORDER — AMLODIPINE BESYLATE 5 MG PO TABS: 5.0000 mg | ORAL_TABLET | Freq: Every day | ORAL | 2 refills | Status: DC

## 2022-12-25 NOTE — Progress Notes (Signed)
New Patient Office Visit  Subjective    Patient ID: Damon Parrish, male    DOB: 10/27/1940  Age: 82 y.o. MRN: [id number]  CC:  Chief Complaint  Patient presents with   Establish Care    HPI Damon Parrish presents to establish care.  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  He has previously been followed by PCP in [place], [place].  He is followed by multiple specialists within [facility]  Mr. Uppal reports feeling well today.  He is asymptomatic and has no acute concerns to discuss.  Chronic medical conditions and outstanding preventative care items discussed today are individually addressed in A/P below.  Outpatient Encounter Medications as of 12/25/2022  Medication Sig   acetaminophen (TYLENOL) 325 MG tablet Take 1-2 tablets (325-650 mg total) by mouth every 4 (four) hours as needed for mild pain.   albuterol (PROVENTIL) (2.5 MG/3ML) 0.083% nebulizer solution Take 2.5 mg by nebulization every 6 (six) hours as needed for wheezing or shortness of breath.   amLODipine (NORVASC) 5 MG tablet Take 1 tablet (5 mg total) by mouth daily.   Ascorbic Acid (VITAMIN C) 1000 MG tablet Take 1,000 mg by mouth daily.   atorvastatin (LIPITOR) 80 MG tablet Take 1 tablet (80 mg total) by mouth at bedtime.   bimatoprost (LUMIGAN) 0.03 % ophthalmic solution Place 1 drop into the right eye at bedtime.   Brinzolamide-Brimonidine (SIMBRINZA) 1-0.2 % SUSP Place 1 drop into the right eye 2 (two) times daily.   calcium carbonate (OSCAL) 1500 (600 Ca) MG TABS tablet Take 600 mg of elemental calcium by mouth daily with breakfast.   Cholecalciferol 125 MCG (5000 UT) TABS Take 5,000 Units by mouth daily.   clopidogrel (PLAVIX) 75 MG tablet Take 1 tablet (75 mg total) by mouth daily.   finasteride (PROSCAR) 5 MG tablet Take 1  tablet (5 mg total) by mouth daily.   fluticasone (FLONASE) 50 MCG/ACT nasal spray Place 2 sprays into both nostrils daily.   levETIRAcetam (KEPPRA) 500 MG tablet Take 1 tablet (500 mg total) by mouth 2 (two) times daily.   Multiple Vitamin (MULTIVITAMIN) capsule Take 1 capsule by mouth daily. 50+   multivitamin-lutein (OCUVITE-LUTEIN) CAPS capsule Take 1 capsule by mouth daily.   prednisoLONE acetate (PRED FORTE) 1 % ophthalmic suspension Place 1 drop into the left eye in the morning.   senna-docusate (SENOKOT-S) 8.6-50 MG tablet Take 2 tablets by mouth at bedtime as needed for mild constipation. (Patient taking differently: Take 1 tablet by mouth daily.)   tamsulosin (FLOMAX) 0.4 MG CAPS capsule Take 1 capsule (0.4 mg total) by mouth daily after supper.   timolol (TIMOPTIC) 0.25 % ophthalmic solution Place 1 drop into the right eye 2 (two) times daily.   TRELEGY ELLIPTA 100-62.5-25 MCG/ACT AEPB Inhale 1 puff into the lungs daily.   vitamin E 400 UNIT capsule Take 400 Units by mouth daily.   [DISCONTINUED] amLODipine (NORVASC) 10 MG tablet Take 10 mg by mouth daily.   No facility-administered encounter medications on file as of 12/25/2022.    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  GERD (gastroesophageal reflux disease)    H/O cerebral aneurysm repair 2016   "put stent in"   High cholesterol    History of blood transfusion ~ 1950   "while in hospital w/pneumonia" (01/09/2018)   History of kidney stones    Hypertension    Nonruptured cerebral aneurysm    Pneumonia ~ 1950   Red man syndrome    Seizures 12/27/2015; 12/28/2015   Stroke 12/2015   denies residual on 01/09/2018)    Past Surgical History:  Procedure Laterality Date   BRONCHIAL BIOPSY  12/17/2022   Procedure: BRONCHIAL BIOPSIES;  Surgeon: Josephine Igo, DO;  Location: [facility] ENDOSCOPY;  Service: Pulmonary;;   BRONCHIAL NEEDLE ASPIRATION BIOPSY  12/17/2022   Procedure: BRONCHIAL NEEDLE ASPIRATION BIOPSIES;  Surgeon: Josephine Igo, DO;  Location: [facility] ENDOSCOPY;  Service: Pulmonary;;   CATARACT EXTRACTION W/ INTRAOCULAR LENS  IMPLANT, BILATERAL Bilateral    COLONOSCOPY     2013 per patient: done in [place], normal, next colonoscopy due in 10 years   EYE SURGERY Left 06/17/2022   FOOT FRACTURE SURGERY Left    "pins, etc. in there"   FRACTURE SURGERY     LOOP RECORDER INSERTION N/A 11/15/2022   Procedure: LOOP RECORDER INSERTION;  Surgeon: Maurice Small, MD;  Location: [facility] INVASIVE CV LAB;  Service: Cardiovascular;  Laterality: N/A;   RADIOLOGY WITH ANESTHESIA N/A 05/04/2015   Procedure: Pipeline Embolization;  Surgeon: Lisbeth Renshaw, MD;  Location: [facility] NEURO ORS;  Service: Radiology;  Laterality: N/A;   RADIOLOGY WITH ANESTHESIA N/A 05/31/2015   Procedure: Pipeline Embolization;  Surgeon: Lisbeth Renshaw, MD;  Location[facility];  Service: Radiology;  Laterality: N/A;    Family History  Problem Relation Age of Onset   Lymphoma Mother    Dementia Mother    Aneurysm Father    Colon cancer Neg Hx     Social History   Socioeconomic History   Marital status: Married    Spouse name: Not on file   Number of children: Not on file   Years of education: Not on file   Highest education level: Not on file  Occupational History   Not on file  Tobacco Use   Smoking status: Every Day    Packs/day: 1.00    Years: 61.00    Additional pack years: 0.00    Total pack years: 61.00    Types: Cigarettes   Smokeless tobacco: Never   Tobacco comments:    Smokes 4 packs of cigarettes in a week. 12/05/22 [place]  Vaping Use   Vaping Use: Never used  Substance and Sexual Activity   Alcohol use: No    Alcohol/week: 0.0 standard drinks of alcohol   Drug use: No   Sexual activity: Not Currently  Other Topics Concern   Not on file  Social  History Narrative   Are you right handed or left handed? Right handed    Are you currently employed ? Retired    What is your current occupation?   Do you live at home alone?no    Who lives with you? wife   What type of home do you live in: 1 story or 2 story?  1 story, ranch home 1 step        Social Determinants of Health   Financial Resource Strain: Not on file  Food Insecurity: No Food Insecurity (11/14/2022)   Hunger Vital Sign    Worried About Running Out of Food in the Last Year: Never true  Ran Out of Food in the Last Year: Never true  Transportation Needs: No Transportation Needs (11/14/2022)   PRAPARE - Administrator, Civil Service (Medical): No    Lack of Transportation (Non-Medical): No  Physical Activity: Not on file  Stress: Not on file  Social Connections: Not on file  Intimate Partner Violence: Not At Risk (11/14/2022)   Humiliation, Afraid, Rape, and Kick questionnaire    Fear of Current or Ex-Partner: No    Emotionally Abused: No    Physically Abused: No    Sexually Abused: No   Review of Systems  Constitutional:  Negative for chills and fever.  HENT:  Negative for sore throat.   Respiratory:  Negative for cough and shortness of breath.   Cardiovascular:  Negative for chest pain, palpitations and leg swelling.  Gastrointestinal:  Negative for abdominal pain, blood in stool, constipation, diarrhea, nausea and vomiting.  Genitourinary:  Negative for dysuria and hematuria.  Musculoskeletal:  Negative for myalgias.  Skin:  Negative for itching and rash.  Neurological:  Negative for dizziness and headaches.  Psychiatric/Behavioral:  Negative for depression and suicidal ideas.    Objective    BP 131/77   Pulse 66   Ht 5\' 9"  (1.753 m)   Wt 181 lb 6.4 oz (82.3 kg)   SpO2 96%   BMI 26.79 kg/m   Physical Exam Vitals reviewed.  Constitutional:      General: He is not in acute distress.    Appearance: Normal appearance. He is not ill-appearing.   HENT:     Head: Normocephalic and atraumatic.     Right Ear: External ear normal.     Left Ear: External ear normal.     Nose: Nose normal. No congestion or rhinorrhea.     Mouth/Throat:     Mouth: Mucous membranes are moist.     Pharynx: Oropharynx is clear.  Eyes:     General: No scleral icterus.    Extraocular Movements: Extraocular movements intact.     Conjunctiva/sclera: Conjunctivae normal.     Pupils: Pupils are equal, round, and reactive to light.  Cardiovascular:     Rate and Rhythm: Normal rate and regular rhythm.     Pulses: Normal pulses.     Heart sounds: Normal heart sounds. No murmur heard. Pulmonary:     Effort: Pulmonary effort is normal.     Breath sounds: Normal breath sounds. No wheezing, rhonchi or rales.  Abdominal:     General: Abdomen is flat. Bowel sounds are normal. There is no distension.     Palpations: Abdomen is soft.     Tenderness: There is no abdominal tenderness.  Musculoskeletal:        General: No swelling or deformity. Normal range of motion.     Cervical back: Normal range of motion.  Skin:    General: Skin is warm and dry.     Capillary Refill: Capillary refill takes less than 2 seconds.  Neurological:     General: No focal deficit present.     Mental Status: He is alert and oriented to person, place, and time.     Motor: No weakness.     Comments: 4/5 strength in LUE compared to RUE.  Psychiatric:        Mood and Affect: Mood normal.        Behavior: Behavior normal.        Thought Content: Thought content normal.   Last CBC Lab Results  Component Value Date  WBC 7.3 12/02/2022   HGB 14.5 12/02/2022   HCT 41.4 12/02/2022   MCV 99.0 12/02/2022   MCH 34.7 (H) 12/02/2022   RDW 12.7 12/02/2022   PLT 259 12/02/2022   Last metabolic panel Lab Results  Component Value Date   GLUCOSE 127 (H) 12/02/2022   NA 134 (L) 12/02/2022   K 4.3 12/02/2022   CL 99 12/02/2022   CO2 29 12/02/2022   BUN 14 12/02/2022   CREATININE 1.03  12/02/2022   GFRNONAA >60 12/02/2022   CALCIUM 9.4 12/02/2022   PROT 6.6 12/02/2022   ALBUMIN 3.9 12/02/2022   BILITOT 0.3 12/02/2022   ALKPHOS 110 12/02/2022   AST 17 12/02/2022   ALT 19 12/02/2022   ANIONGAP 6 12/02/2022   Last lipids Lab Results  Component Value Date   CHOL 146 11/14/2022   HDL 46 11/14/2022   LDLCALC 81 11/14/2022   TRIG 96 11/14/2022   CHOLHDL 3.2 11/14/2022   Last hemoglobin A1c Lab Results  Component Value Date   HGBA1C 6.1 (H) 11/13/2022   Last thyroid functions Lab Results  Component Value Date   TSH 1.263 12/16/2017    Assessment & Plan:   Problem List Items Addressed This Visit       HTN (hypertension) - Primary    Previously documented history of hypertension.  Antihypertensive medications were discontinued during recent hospital admission for permissive hypertension in setting of CVA.  BP was mildly elevated initially today at 143/76 and improved to 131/77 on repeat. 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  Followed by PM&R and neurology.  Currently prescribed Plavix/statin.  He has completed a loop recorder, which was largely nonrevealing. -No medication changes today      NSCLC of right lung    Recently diagnosed stage I NSCLC of the right upper lobe.  He has establish care with oncology and will be evaluated by CT surgeon later this month to consider resection.  PFTs are pending (4/19). -Pending CT surgery evaluation and PFTs (4/19).      Emphysema lung    Previously documented history of emphysema.  COPD suspected but no PFTs on file.  PFTs are scheduled to be completed on 4/19.  He is currently prescribed Trelegy.  Asymptomatic currently.  Unremarkable pulmonary exam today. -PFTs  pending -No medication changes today      Seizure disorder    He endorses a history of seizures and is currently prescribed Keppra 500 mg twice daily.  Denies any recent seizure activity.      BPH (benign prostatic hyperplasia)    Symptoms are currently well-controlled with Flomax and finasteride. -No medication changes today      Tobacco use    He is currently smoking 0.5 packs/day of cigarettes and has been smoking since age 82.  He has mostly smoked 1 pack/day over this time.  States that he has recently been prescribed patches and plans to start applying them soon. -The patient was counseled on the dangers of tobacco use, and was advised to quit.  Reviewed strategies to maximize success, including removing cigarettes and smoking materials from environment, stress management, substitution of other forms of reinforcement, support of family/friends, and written materials.       Prediabetes  A1c 6.1 during recent hospital admission.  Counseled on dietary modifications aimed at improving his average blood sugar.      Primary open angle glaucoma, both eyes    Followed by Dr. Chales Abrahams with ophthalmology at the [facility].  He is currently prescribed multiple ophthalmic drops including Simbrinza, prednisolone, timolol, and Lumigan.  Follow-up is scheduled for later this week (4/5).      Encounter for general adult medical examination with abnormal findings    Presenting today to establish care.  Previous records and labs have been reviewed. -No additional labs ordered today -Resume amlodipine for improved HTN control -We will tentatively plan for follow-up in 2-4 weeks for BP check       Return in about 2 weeks (around 01/08/2023) for HTN.   Billie Lade, MD

## 2022-12-25 NOTE — Patient Instructions (Signed)
It was a pleasure to see you today.  Thank you for giving [place] the opportunity to be involved in your care.  Below is a brief recap of your visit and next steps.  We will plan to see you again in 2-4 weeks.  Summary You have established care today Restart amlodipine at 5 mg daily We will follow up in 2-4 weeks for BP check

## 2022-12-26 ENCOUNTER — Encounter (HOSPITAL_COMMUNITY): Payer: Medicare Other | Admitting: Occupational Therapy

## 2022-12-26 ENCOUNTER — Ambulatory Visit (HOSPITAL_COMMUNITY): Payer: Medicare Other | Attending: Physician Assistant | Admitting: Physical Therapy

## 2022-12-26 DIAGNOSIS — M6281 Muscle weakness (generalized): Secondary | ICD-10-CM | POA: Diagnosis present

## 2022-12-26 DIAGNOSIS — R262 Difficulty in walking, not elsewhere classified: Secondary | ICD-10-CM | POA: Insufficient documentation

## 2022-12-26 NOTE — Therapy (Signed)
OUTPATIENT PHYSICAL THERAPY TREATMENT  Patient Name: Damon Parrish MRN: [id number] DOB:Feb 11, 1941, 82 y.o., male Today's Date: 12/26/2022   PCP: Tobe Sos, MD REFERRING PROVIDER: Cathlyn Parsons, PA-C  END OF SESSION:  PT End of Session - 12/26/22 0935     Visit Number 5    Number of Visits 16    Date for PT Re-Evaluation 01/21/23    Authorization Type Medicare    Progress Note Due on Visit 8    PT Start Time 0945    PT Stop Time 1025    PT Time Calculation (min) 40 min    Activity Tolerance Patient tolerated treatment well    Behavior During Therapy WFL for tasks assessed/performed            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   GERD (gastroesophageal reflux disease)    H/O cerebral aneurysm repair 2016   "put stent in"   High cholesterol    History of blood transfusion ~ 1950   "while in hospital w/pneumonia" (01/09/2018)   History of kidney stones    Hypertension    Nonruptured cerebral aneurysm    Pneumonia ~ 1950   Red man syndrome    Seizures 12/27/2015; 12/28/2015   Stroke 12/2015   denies residual on 01/09/2018)   Past Surgical History:  Procedure Laterality Date   BRONCHIAL BIOPSY  12/17/2022   Procedure: BRONCHIAL BIOPSIES;  Surgeon: Garner Nash, DO;  Location: [place];  Service: Pulmonary;;   BRONCHIAL NEEDLE ASPIRATION BIOPSY  12/17/2022   Procedure: BRONCHIAL NEEDLE ASPIRATION BIOPSIES;  Surgeon: Garner Nash, DO;  Location: [facility];  Service: Pulmonary;;   CATARACT EXTRACTION W/ INTRAOCULAR LENS  IMPLANT, BILATERAL Bilateral    COLONOSCOPY     2013 per patient: done in [place], normal, next colonoscopy due in 10 years   EYE SURGERY Left 06/17/2022   FOOT FRACTURE SURGERY Left    "pins, etc. in there"   FRACTURE SURGERY     LOOP RECORDER  INSERTION N/A 11/15/2022   Procedure: LOOP RECORDER INSERTION;  Surgeon: Melida Quitter, MD;  Location: [facility];  Service: Cardiovascular;  Laterality: N/A;   RADIOLOGY WITH ANESTHESIA N/A 05/04/2015   Procedure: Pipeline Embolization;  Surgeon: Consuella Lose, MD;  [facility];  Service: Radiology;  Laterality: N/A;   RADIOLOGY WITH ANESTHESIA N/A 05/31/2015   Procedure: Pipeline Embolization;  Surgeon: Consuella Lose, MD;  Location: [place];  Service: Radiology;  Laterality: N/A;   Patient Active Problem List   Diagnosis Date Noted   Lung nodule 12/05/2022   CVA (cerebral vascular accident) 11/14/2022   Seizure disorder 11/13/2022   Pulmonary nodule 11/13/2022   Cough 11/13/2022   Left leg numbness    Muscle fasciculation    Focal motor seizure 01/07/2019   Allergic reaction    DRESS syndrome    Pressure injury of skin 01/09/2018   Adverse drug reaction 01/08/2018   Dermatitis, drug-induced 01/08/2018   Discitis 12/17/2017   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   Rectal bleeding    Constipation 12/16/2017   Cerebrovascular accident (CVA) due to embolism of right middle cerebral artery 03/06/2016   S/P cerebral aneurysm repair 03/06/2016   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   Palpitations 01/05/2016  Aneurysm, cerebral, nonruptured    Hyperlipidemia    Acute CVA (cerebrovascular accident) 12/30/2015   Acute right MCA stroke    Focal seizure 12/28/2015   HTN (hypertension) 12/28/2015   Hyponatremia 12/28/2015   Cerebral aneurysm 05/04/2015    ONSET DATE: 11/13/2022  REFERRING DIAG: ACUTE RIGHT MCA STROKE  THERAPY DIAG:  Difficulty walking  Muscle weakness (generalized)  Rationale for Evaluation and Treatment: Rehabilitation  SUBJECTIVE:                                                                                                                                                                                              SUBJECTIVE STATEMENT: No reports of pain or falls since last session.  States they found stage 1 lung cancer and has appt with surgeon next Friday to see if it can be removed or if he has to start radiation/chenmo.  Eval: Comes to the clinic with some unsteadiness when walking. Also c/o weakness on the L LE. Reports that the L LE feels like it's going to give way. Patient had CVA in 11/13/22. Patient was admitted to the hospital for the week (including inpatient rehab). Upon D/C patient was then sent to outpatient PT evaluation and management.  Pt accompanied by:  wife  PERTINENT HISTORY: HTN, hx of osteomyelitis of the thoracic spine, hx of L foot fx   PAIN:  Are you having pain? No  PRECAUTIONS: None  WEIGHT BEARING RESTRICTIONS: No  FALLS: Has patient fallen in last 6 months? Yes. Number of falls 1 (10/03/2022), MD already aware  LIVING ENVIRONMENT: Lives with: lives with their spouse Lives in: House/apartment Stairs: Yes: External: 1 steps; none Has following equipment at home: Single point cane, Environmental consultant - 2 wheeled, Environmental consultant - 4 wheeled, and Wheelchair (manual)  PLOF: Independent with community mobility with device [facility])  PATIENT GOALS: "get going and get moving"  OBJECTIVE:   DIAGNOSTIC FINDINGS:  MRI HEAD WITHOUT CONTRAST 11/13/2022   TECHNIQUE: Multiplanar, multiecho pulse sequences of the brain and surrounding structures were obtained without intravenous contrast.   COMPARISON:  12/30/2015   FINDINGS: Brain: Small area of abnormal diffusion restriction within the right corona radiata. Chronic right subdural hematoma. No acute hemorrhage. There is multifocal hyperintense T2-weighted signal within the white matter. Generalized volume loss. The midline structures are normal.   Vascular: Major flow voids are preserved.   Skull and upper cervical spine: Normal calvarium and skull base. Visualized upper cervical spine and soft tissues are normal.    Sinuses/Orbits:No paranasal sinus fluid levels or advanced mucosal thickening. No mastoid or middle ear effusion. Normal orbits.   IMPRESSION: 1.  Small area of acute or early subacute ischemia within the right corona radiata. 2. Chronic right subdural hematoma.    COGNITION: Overall cognitive status: Within functional limits for tasks assessed   SENSATION: Light touch: WFL  COORDINATION: Intact  MUSCLE TONE: WNL on B LE  MUSCLE LENGTH: Mild tightness on B hamstrings and gastrocsoleus complex  POSTURE: rounded shoulders and forward head  LOWER EXTREMITY ROM:     Active  Right Eval Left Eval  Hip flexion [facility]  Hip extension [facility]  Hip abduction [facility]  Hip adduction    Hip internal rotation    Hip external rotation    Knee flexion [facility] WFL  Knee extension [facility]  Ankle dorsiflexion [facility] WFL  Ankle plantarflexion [facility] WFL  Ankle inversion    Ankle eversion     (Blank rows = not tested)  LOWER EXTREMITY MMT:    MMT Right Eval Left Eval  Hip flexion 4 4+  Hip extension 3+ 3+  Hip abduction 4 4  Hip adduction    Hip internal rotation    Hip external rotation    Knee flexion 4+ 4  Knee extension 4+ 4  Ankle dorsiflexion 4+ 4+  Ankle plantarflexion 4+ 4+  Ankle inversion    Ankle eversion    (Blank rows = not tested)  BED MOBILITY:  Sit to supine Complete Independence Supine to sit Complete Independence Rolling to Right Complete Independence Rolling to Left Complete Independence  TRANSFERS: Assistive device utilized: Single point cane  Sit to stand: Modified independence Stand to sit: Modified independence  GAIT:  Distance walked: 311 ft Assistive device utilized: Single point cane Level of assistance: Modified independence Comments: B LE in ER, wide BOS  FUNCTIONAL TESTS:  5 times sit to stand: 17.83 sec 2 minute walk test: 311 ft with SPC. Tinetti POMA: 16 (balance + gait) done with SPC   TODAY'S TREATMENT:                                                                                                                               DATE:  12/26/22 Sit to stand x 10 no UE assist from chair Standing: Heel raises 20X Hip abduction 2 x 10 each Hip extension 2 x 10 each 4" step ups 2 x 10 leading up with the left 4" lateral step ups 2 x 10  4" lunges with intermittent HHA X10 each with intermittent HHA  Sitting: 3# LAQs 2" hold x 10 3# hip flexion x 10 RTB  hip abduction 2 x 10   12/16/22 Sit to stand x 10 no UE assist from chair  Sitting: 3# LAQs 2" hold x 10 3# hip flexion x 10 RTB  hip abduction 2 x 10   Standing: Heel raises 2 x 10 Hip abduction 2 x 10 each Hip extension 2 x 10 each 4" step ups 2 x 10 leading up with the left 4" lateral step ups  2 x 10    12/11/22 Sit to stand x 10 Standing: Heel raise x 15 Toe raise x 1 Hip abduction 2 x 10  Hip extension 2X10 Vector stance with 1 HHA 5X3" each Semi tandem stance 2 x 30" intermittent HHA Forward step ups 1 HHA 4" 10X each Lateral step ups 1 HHA 4" 10X each   Lunges onto 4" with intermittent HHA and max cues for mechanics   12/09/22 Sit to stand x 10 Heel raise x 15 Toe raise x 15 Standing hip abduction 2 x 10  Semi tandem stance 3 x 20"     Step up 4 inch 2 x 10  Step taps 4 inch x20   11/26/22 Evaluation done Patient education Seated gastrocnemius stretch x 30" x 3  Mini squats with slight UE x 3" x 10   PATIENT EDUCATION: Education details: Educated on the pathoanatomy of CVA. Educated on the goals and course of rehab. Written HEP provided and reviewed. Education on falls risk reduction at home Person educated: Patient Education method: Explanation, Demonstration, and Handouts Education comprehension: verbalized understanding and returned demonstration  HOME EXERCISE PROGRAM: 12/16/22 hip abduction with theraband  Access Code: [email or web address] URL: [email or web address]  12/09/22   Sit to Stand with Arms Crossed  - 1-2  x daily - 7 x weekly - 2 sets - 10 reps - Heel Raises with Counter Support  - 1-2 x daily - 7 x weekly - 2 sets - 10 reps - Toe Raises with Counter Support  - 1-2 x daily - 7 x weekly - 2 sets - 10 reps - Standing Tandem Balance with Counter Support  - 1-2 x daily - 7 x weekly - 1 sets - 3 reps - 20 second hold - Standing Hip Abduction with Counter Support  - 1-2 x daily - 7 x weekly - 2 sets - 10 reps   Date: 11/26/2022 Prepared by: Rexene Alberts  Exercises - Seated Calf Stretch with Strap  - 1-2 x daily - 5-7 x weekly - 3 reps - 30 hold - Mini Squat with Counter Support  - 1-2 x daily - 5-7 x weekly - 2 sets - 10 reps - 3 hold  GOALS: Goals reviewed with patient? Yes  SHORT TERM GOALS: Target date: 12/24/22   Pt will demonstrate indep in HEP to facilitate carry-over of skilled services and improve functional outcomes Goal status: INITIAL  LONG TERM GOALS: Target date: 01/21/23  Pt will decrease 5TSTS by at least 3 seconds in order to demonstrate clinically significant improvement in LE strength Baseline: 17.83 Goal status: INITIAL  2.  Pt will increase 2MWT by at least 40 ft in order to demonstrate clinically significant improvement in community ambulation Baseline: 311 ft  Goal status: INITIAL  3.  Patient will demonstrate increase in Tinetti Score by 4 points in order to demonstrate clinically significant improvement in balance and decreased risk for falls Baseline: 16 Goal status: INITIAL  4.  Pt will demonstrate increase in LE strength to 4+/5 to facilitate ease and safety in ambulation  Baseline: 3+/5 Goal status: INITIAL  ASSESSMENT:  CLINICAL IMPRESSION: Continued with focus on improving LE strength and stability.  Pt required 3 short seated rest breaks during session today due to fatigue.   Constant cues needed for upright posturing, isolation of mm, and overall form.  Added forward lunge with some diffuculty with mechanics and stability.  Intermittent HHA was  required to maintain balance. continues occasional shortness of breath noted with  standing exercise.  Pt required close SBA and CGA during session today . Pt will continue to benefit from skilled therapy services to reduce remaining deficits and improve functional ability.    OBJECTIVE IMPAIRMENTS: Abnormal gait, difficulty walking, decreased ROM, decreased strength, and impaired flexibility.   ACTIVITY LIMITATIONS: carrying, lifting, bending, standing, and transfers  PARTICIPATION LIMITATIONS: meal prep, cleaning, laundry, and community activity  PERSONAL FACTORS: Age and Fitness are also affecting patient's functional outcome.   REHAB POTENTIAL: Good  CLINICAL DECISION MAKING: Stable/uncomplicated  EVALUATION COMPLEXITY: Low  PLAN:  PT FREQUENCY: 2x/week  PT DURATION: 8 weeks  PLANNED INTERVENTIONS: Therapeutic exercises, Therapeutic activity, Neuromuscular re-education, Balance training, Gait training, Patient/Family education, Self Care, Stair training, and Manual therapy  PLAN FOR NEXT SESSION: Progress LE strengthening and stretching as tolerated. Progress balance and gait activities. Update HEP prn.  9:35 AM, 12/26/22 Teena Irani, PTA/CLT [place] Ph: [phone]

## 2022-12-27 ENCOUNTER — Other Ambulatory Visit: Payer: Self-pay

## 2022-12-27 NOTE — Progress Notes (Signed)
The proposed treatment discussed in conference is for discussion purpose only and is not a binding recommendation.  The patients have not been physically examined, or presented with their treatment options.  Therefore, final treatment plans cannot be decided.  

## 2022-12-27 NOTE — Progress Notes (Signed)
Referral placed to [facility].   Thanks,  BLI  Josephine Igo, DO [facility] 12/27/2022 4:55 PM

## 2022-12-31 ENCOUNTER — Encounter: Payer: Self-pay | Admitting: Physical Medicine & Rehabilitation

## 2022-12-31 ENCOUNTER — Encounter: Payer: Self-pay | Admitting: Internal Medicine

## 2022-12-31 ENCOUNTER — Encounter (HOSPITAL_COMMUNITY): Payer: Medicare Other | Admitting: Occupational Therapy

## 2022-12-31 ENCOUNTER — Encounter: Payer: Medicare Other | Attending: Physical Medicine & Rehabilitation | Admitting: Physical Medicine & Rehabilitation

## 2022-12-31 ENCOUNTER — Ambulatory Visit (HOSPITAL_COMMUNITY): Payer: Medicare Other | Admitting: Physical Therapy

## 2022-12-31 VITALS — BP 117/74 | HR 66 | Ht 69.0 in | Wt 180.0 lb

## 2022-12-31 DIAGNOSIS — I639 Cerebral infarction, unspecified: Secondary | ICD-10-CM | POA: Diagnosis present

## 2022-12-31 DIAGNOSIS — J439 Emphysema, unspecified: Secondary | ICD-10-CM | POA: Insufficient documentation

## 2022-12-31 DIAGNOSIS — J449 Chronic obstructive pulmonary disease, unspecified: Secondary | ICD-10-CM | POA: Insufficient documentation

## 2022-12-31 DIAGNOSIS — R7303 Prediabetes: Secondary | ICD-10-CM | POA: Insufficient documentation

## 2022-12-31 DIAGNOSIS — Z0001 Encounter for general adult medical examination with abnormal findings: Secondary | ICD-10-CM | POA: Insufficient documentation

## 2022-12-31 DIAGNOSIS — Z720 Tobacco use: Secondary | ICD-10-CM | POA: Insufficient documentation

## 2022-12-31 DIAGNOSIS — N400 Enlarged prostate without lower urinary tract symptoms: Secondary | ICD-10-CM | POA: Insufficient documentation

## 2022-12-31 DIAGNOSIS — C3491 Malignant neoplasm of unspecified part of right bronchus or lung: Secondary | ICD-10-CM | POA: Insufficient documentation

## 2022-12-31 DIAGNOSIS — H401130 Primary open-angle glaucoma, bilateral, stage unspecified: Secondary | ICD-10-CM | POA: Insufficient documentation

## 2022-12-31 NOTE — Assessment & Plan Note (Signed)
Previously documented history of emphysema.  COPD suspected but no PFTs on file.  PFTs are scheduled to be completed on 4/19.  He is currently prescribed Trelegy.  Asymptomatic currently.  Unremarkable pulmonary exam today. -PFTs pending -No medication changes today

## 2022-12-31 NOTE — Assessment & Plan Note (Signed)
He is currently smoking 0.5 packs/day of cigarettes and has been smoking since age 82.  He has mostly smoked 1 pack/day over this time.  States that he has recently been prescribed patches and plans to start applying them soon. -The patient was counseled on the dangers of tobacco use, and was advised to quit.  Reviewed strategies to maximize success, including removing cigarettes and smoking materials from environment, stress management, substitution of other forms of reinforcement, support of family/friends, and written materials.

## 2022-12-31 NOTE — Patient Instructions (Signed)
Keep up with your therapy appts and exercises!  If you undergo surgery and need additional recovery time, we may be able to bring you back to rehab

## 2022-12-31 NOTE — Assessment & Plan Note (Signed)
Previously documented history of hypertension.  Antihypertensive medications were discontinued during recent hospital admission for permissive hypertension in setting of CVA.  BP was mildly elevated initially today at 143/76 and improved to 131/77 on repeat. -Resume amlodipine at 5 mg daily -Follow-up in 2-4 weeks for HTN check

## 2022-12-31 NOTE — Assessment & Plan Note (Signed)
Followed by Dr. Chales Abrahams with ophthalmology at the [facility].  He is currently prescribed multiple ophthalmic drops including Simbrinza, prednisolone, timolol, and Lumigan.  Follow-up is scheduled for later this week (4/5).

## 2022-12-31 NOTE — Progress Notes (Signed)
Subjective:    Patient ID: Damon Parrish, male    DOB: 04/02/1941, 82 y.o.   MRN: [id number] 82 y.o. male  [facility] on 11/13/2022 onset of left-sided weakness. Code stroke was initiated.  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  He awoke on the morning of presentation in his normal state of health.  He went back to sleep and woke up approximately 4 hours later and noticed he was clumsy in his left hand and left leg was weak and shaky.  He is maintained on aspirin 325 mg.  He was out of the window for tenecteplase.  Imaging revealed chronic relatively stable subdural low-attenuation collection.  CTA of head and neck with no LVO.  MRI of the brain revealed small area of acute or early subacute ischemia within the right corona radiata.  Admit date: 11/15/2022 Discharge date: 11/21/2022 HPI Wife still notes some left-sided weakness however upon further questioning patient was using a cane prior to his stroke due to severe arthritis of the right knee.  He has been going to outpatient physical therapy twice a week.  His upper extremity function is good he is modified independent with all dressing and bathing. He has been diagnosed with adenocarcinoma of the lung.  He has been following up with oncology as well as pulmonology and now has appointment with cardiovascular surgery. Other issues include severe right knee arthritis which has not had surgery. Pain Inventory Average Pain 0 Pain Right Now 0 My pain is  n/a  LOCATION OF PAIN  n/a  BOWEL Number of stools per week: 5 Oral laxative use Yes  Type of laxative senna Enema or suppository use No  History of colostomy No  Incontinent No   BLADDER Normal In and out cath, frequency n/a Able to self cath  n/a Bladder incontinence No  Frequent urination No  Leakage with coughing No  Difficulty  starting stream No  Incomplete bladder emptying No    Mobility use a cane ability to climb steps?  yes do you drive?  yes  Function retired  Neuro/Psych trouble walking  Prior Studies CT/MRI  Physicians involved in your care New patient   Family History  Problem Relation Age of Onset   Lymphoma Mother    Dementia Mother    Aneurysm Father    Colon cancer Neg Hx    Social History   Socioeconomic History   Marital status: Married    Spouse name: Not on file   Number of children: Not on file   Years of education: Not on file   Highest education level: Not on file  Occupational History   Not on file  Tobacco Use   Smoking status: Every Day    Packs/day: 1.00    Years: 61.00    Additional pack years: 0.00    Total pack years: 61.00    Types: Cigarettes   Smokeless tobacco: Never   Tobacco comments:    Smokes 4 packs of cigarettes in a week. 12/05/22 [place]  Vaping Use   Vaping Use: Never used  Substance and Sexual Activity   Alcohol use: No    Alcohol/week: 0.0 standard drinks of alcohol   Drug use: No   Sexual activity: Not Currently  Other Topics Concern   Not on file  Social History Narrative   Are you right handed or left handed?  Right handed    Are you currently employed ? Retired    What is your current occupation?   Do you live at home alone?no    Who lives with you? wife   What type of home do you live in: 1 story or 2 story?  1 story, ranch home 1 step        Social Determinants of Health   Financial Resource Strain: Not on file  Food Insecurity: No Food Insecurity (11/14/2022)   Hunger Vital Sign    Worried About Running Out of Food in the Last Year: Never true    Ran Out of Food in the Last Year: Never true  Transportation Needs: No Transportation Needs (11/14/2022)   PRAPARE - Administrator, Civil Service (Medical): No    Lack of Transportation (Non-Medical): No  Physical Activity: Not on file  Stress: Not on file  Social  Connections: Not on file   Past Surgical History:  Procedure Laterality Date   BRONCHIAL BIOPSY  12/17/2022   Procedure: BRONCHIAL BIOPSIES;  Surgeon: Josephine Igo, DO;  Location: [facility];  Service: Pulmonary;;   BRONCHIAL NEEDLE ASPIRATION BIOPSY  12/17/2022   Procedure: BRONCHIAL NEEDLE ASPIRATION BIOPSIES;  Surgeon: Josephine Igo, DO;  Location: [facility];  Service: Pulmonary;;   CATARACT EXTRACTION W/ INTRAOCULAR LENS  IMPLANT, BILATERAL Bilateral    COLONOSCOPY     2013 per patient: done in [place], normal, next colonoscopy due in 10 years   EYE SURGERY Left 06/17/2022   FOOT FRACTURE SURGERY Left    "pins, etc. in there"   FRACTURE SURGERY     LOOP RECORDER INSERTION N/A 11/15/2022   Procedure: LOOP RECORDER INSERTION;  Surgeon: Maurice Small, MD;  [facility];  Service: Cardiovascular;  Laterality: N/A;   RADIOLOGY WITH ANESTHESIA N/A 05/04/2015   Procedure: Pipeline Embolization;  Surgeon: Lisbeth Renshaw, MD;  Location: [facility];  Service: Radiology;  Laterality: N/A;   RADIOLOGY WITH ANESTHESIA N/A 05/31/2015   Procedure: Pipeline Embolization;  Surgeon: Lisbeth Renshaw, MD;  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   GERD (gastroesophageal reflux disease)    H/O cerebral aneurysm repair 2016   "put stent in"   High cholesterol    History of blood transfusion ~ 1950   "while in hospital w/pneumonia" (01/09/2018)   History of kidney stones    Hypertension    Nonruptured cerebral aneurysm    Pneumonia ~ 1950   Red man syndrome    Seizures 12/27/2015; 12/28/2015   Stroke 12/2015   denies residual on 01/09/2018)   BP 117/74   Pulse 66   Ht 5\' 9"  (1.753 m)   Wt 180 lb (81.6 kg)   SpO2 98%   BMI 26.58  kg/m   Opioid Risk Score:   Fall Risk Score:  `1  Depression screen [facility] 2/9     12/31/2022   11:11 AM 12/25/2022    2:11 PM  Depression screen PHQ 2/9  Decreased Interest 0 0  Down, Depressed, Hopeless 0 0  PHQ - 2 Score 0 0  Altered sleeping 0 1  Tired, decreased energy 2 3  Change in appetite 0 0  Feeling bad or  failure about yourself  0 0  Trouble concentrating 0 0  Moving slowly or fidgety/restless 0 0  Suicidal thoughts 0 0  PHQ-9 Score 2 4      Review of Systems  Constitutional:  Positive for fatigue.  All other systems reviewed and are negative.      Objective:   Physical Exam  Motor strength is 5/5 in the right deltoid bicep tricep grip hip flexor knee extensor ankle dorsiflexor 4+ in the left deltoid by stress of grip hip flexor knee extensor ankle dorsiflexor Finger thumb opposition is intact left upper extremity as well as right upper extremity. Mild dysdiadochokinesis with rapid alternating supination pronation left upper extremity. Ambulates with cane he has valgus deformity at the right knee no evidence of toe drag or knee instability Speech without dysarthria or aphasia. Sensation intact to light touch bilateral upper and lower limbs      Assessment & Plan:  1.  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  Overall has made good improvements post stroke and had fairly mild symptoms at initial presentation to inpatient rehab.  Continue outpatient therapy through this month. Given that he is at a modified independent level and close to his baseline would not schedule physical medicine rehab follow-up 2.  Adenocarcinoma of the lung still undergoing workup.  If patient undergoes surgery and has a prolonged recovery process he may be a candidate for repeat inpatient rehab stay.  If he undergoes radiation therapy would not anticipate any need for any rehab. Have discussed the importance of continuing physical therapy home exercise program  even after physical therapy concludes.

## 2022-12-31 NOTE — Assessment & Plan Note (Signed)
Symptoms are currently well-controlled with Flomax and finasteride. -No medication changes today

## 2022-12-31 NOTE — Assessment & Plan Note (Signed)
His past medical history is significant for multiple CVAs, most recently February 2024 with CVA of right corona radiata.  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  Followed by PM&R and neurology.  Currently prescribed Plavix/statin.  He has completed a loop recorder, which was largely nonrevealing. -No medication changes today

## 2022-12-31 NOTE — Assessment & Plan Note (Signed)
He endorses a history of seizures and is currently prescribed Keppra 500 mg twice daily.  Denies any recent seizure activity.

## 2022-12-31 NOTE — Assessment & Plan Note (Signed)
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  He has establish care with oncology and will be evaluated by CT surgeon later this month to consider resection.  PFTs are pending (4/19). -Pending CT surgery evaluation and PFTs (4/19).

## 2022-12-31 NOTE — Assessment & Plan Note (Signed)
Presenting today to establish care.  Previous records and labs have been reviewed. -No additional labs ordered today -Resume amlodipine for improved HTN control -We will tentatively plan for follow-up in 2-4 weeks for BP check

## 2022-12-31 NOTE — Assessment & Plan Note (Signed)
A1c 6.1 during recent hospital admission.  Counseled on dietary modifications aimed at improving his average blood sugar.

## 2023-01-03 ENCOUNTER — Ambulatory Visit (INDEPENDENT_AMBULATORY_CARE_PROVIDER_SITE_OTHER): Payer: Medicare Other | Admitting: Pulmonary Disease

## 2023-01-03 ENCOUNTER — Ambulatory Visit (HOSPITAL_COMMUNITY): Payer: Medicare Other

## 2023-01-03 ENCOUNTER — Encounter (HOSPITAL_COMMUNITY): Payer: Medicare Other | Admitting: Occupational Therapy

## 2023-01-03 DIAGNOSIS — C3491 Malignant neoplasm of unspecified part of right bronchus or lung: Secondary | ICD-10-CM | POA: Diagnosis not present

## 2023-01-03 LAB — PULMONARY FUNCTION TEST
DL/VA % pred: 74 %
DL/VA: 2.88 ml/min/mmHg/L
DLCO cor % pred: 66 %
DLCO cor: 15.63 ml/min/mmHg
DLCO unc % pred: 66 %
DLCO unc: 15.58 ml/min/mmHg
FEF 25-75 Post: 1.25 L/sec
FEF 25-75 Pre: 0.88 L/sec
FEF2575-%Change-Post: 41 %
FEF2575-%Pred-Post: 69 %
FEF2575-%Pred-Pre: 49 %
FEV1-%Change-Post: 8 %
FEV1-%Pred-Post: 76 %
FEV1-%Pred-Pre: 70 %
FEV1-Post: 2.03 L
FEV1-Pre: 1.87 L
FEV1FVC-%Change-Post: -1 %
FEV1FVC-%Pred-Pre: 89 %
FEV6-%Change-Post: 11 %
FEV6-%Pred-Post: 90 %
FEV6-%Pred-Pre: 80 %
FEV6-Post: 3.16 L
FEV6-Pre: 2.84 L
FEV6FVC-%Change-Post: 1 %
FEV6FVC-%Pred-Post: 105 %
FEV6FVC-%Pred-Pre: 104 %
FVC-%Change-Post: 10 %
FVC-%Pred-Post: 85 %
FVC-%Pred-Pre: 77 %
FVC-Post: 3.23 L
FVC-Pre: 2.93 L
Post FEV1/FVC ratio: 63 %
Post FEV6/FVC ratio: 98 %
Pre FEV1/FVC ratio: 64 %
Pre FEV6/FVC Ratio: 97 %
RV % pred: 133 %
RV: 3.54 L
TLC % pred: 98 %
TLC: 6.73 L

## 2023-01-03 NOTE — Patient Instructions (Signed)
Full PFT Performed Today  

## 2023-01-03 NOTE — Progress Notes (Signed)
Full PFT Performed Today  

## 2023-01-06 ENCOUNTER — Encounter (HOSPITAL_COMMUNITY): Payer: Medicare Other | Admitting: Occupational Therapy

## 2023-01-06 ENCOUNTER — Ambulatory Visit (HOSPITAL_COMMUNITY): Payer: Medicare Other | Admitting: Physical Therapy

## 2023-01-06 DIAGNOSIS — R262 Difficulty in walking, not elsewhere classified: Secondary | ICD-10-CM | POA: Diagnosis not present

## 2023-01-06 DIAGNOSIS — M6281 Muscle weakness (generalized): Secondary | ICD-10-CM

## 2023-01-06 NOTE — Therapy (Signed)
OUTPATIENT PHYSICAL THERAPY TREATMENT  Patient Name: Damon Parrish MRN: [id number] DOB:02-26-1941, 82 y.o., male Today's Date: 01/06/2023   PCP: Elise Benne, MD REFERRING PROVIDER: Charlton Amor, PA-C  END OF SESSION:  PT End of Session - 01/06/23 1306     Visit Number 6    Number of Visits 16    Date for PT Re-Evaluation 01/21/23    Authorization Type Medicare    Progress Note Due on Visit 8    PT Start Time 1303    PT Stop Time 1342    PT Time Calculation (min) 39 min    Activity Tolerance Patient tolerated treatment well    Behavior During Therapy WFL for tasks assessed/performed            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   GERD (gastroesophageal reflux disease)    H/O cerebral aneurysm repair 2016   "put stent in"   High cholesterol    History of blood transfusion ~ 1950   "while in hospital w/pneumonia" (01/09/2018)   History of kidney stones    Hypertension    Nonruptured cerebral aneurysm    Pneumonia ~ 1950   Red man syndrome    Seizures 12/27/2015; 12/28/2015   Stroke 12/2015   denies residual on 01/09/2018)   Past Surgical History:  Procedure Laterality Date   BRONCHIAL BIOPSY  12/17/2022   Procedure: BRONCHIAL BIOPSIES;  Surgeon: Josephine Igo, DO;  Location: [facility];  Service: Pulmonary;;   BRONCHIAL NEEDLE ASPIRATION BIOPSY  12/17/2022   Procedure: BRONCHIAL NEEDLE ASPIRATION BIOPSIES;  Surgeon: Josephine Igo, DO;  Location: [facility];  Service: Pulmonary;;   CATARACT EXTRACTION W/ INTRAOCULAR LENS  IMPLANT, BILATERAL Bilateral    COLONOSCOPY     2013 per patient: done in [place], normal, next colonoscopy due in 10 years   EYE SURGERY Left 06/17/2022   FOOT FRACTURE SURGERY Left    "pins, etc. in there"   FRACTURE SURGERY     LOOP RECORDER  INSERTION N/A 11/15/2022   Procedure: LOOP RECORDER INSERTION;  Surgeon: Maurice Small, MD;  Location: [facility];  Service: Cardiovascular;  Laterality: N/A;   RADIOLOGY WITH ANESTHESIA N/A 05/04/2015   Procedure: Pipeline Embolization;  Surgeon: Lisbeth Renshaw, MD;  Location: [facility];  Service: Radiology;  Laterality: N/A;   RADIOLOGY WITH ANESTHESIA N/A 05/31/2015   Procedure: Pipeline Embolization;  Surgeon: Lisbeth Renshaw, MD;  Location: [facility] OR;  Service: Radiology;  Laterality: N/A;   Patient Active Problem List   Diagnosis Date Noted   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   BPH (benign prostatic hyperplasia) 12/31/2022   Tobacco use 12/31/2022   Prediabetes 12/31/2022   Primary open angle glaucoma, both eyes 12/31/2022   Emphysema lung 12/31/2022   Encounter for general adult medical examination with abnormal findings 12/31/2022   Lung nodule 12/05/2022   CVA (cerebral vascular accident) 11/14/2022   Seizure disorder 11/13/2022   Pulmonary nodule 11/13/2022   Cough 11/13/2022   Left leg numbness    Muscle fasciculation    Focal motor seizure 01/07/2019   Allergic reaction    DRESS syndrome    Pressure injury of skin 01/09/2018   Adverse drug reaction 01/08/2018   Dermatitis, drug-induced 01/08/2018   Discitis 12/17/2017  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   Rectal bleeding    Constipation 12/16/2017   Cerebrovascular accident (CVA) due to embolism of right middle cerebral artery 03/06/2016   S/P cerebral aneurysm repair 03/06/2016   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   Palpitations 01/05/2016   Aneurysm, cerebral, nonruptured    Hyperlipidemia    Acute CVA (cerebrovascular accident) 12/30/2015   Acute right MCA stroke    Focal seizure 12/28/2015   HTN (hypertension) 12/28/2015   Hyponatremia 12/28/2015   Cerebral aneurysm 05/04/2015    ONSET DATE: 11/13/2022  REFERRING DIAG: ACUTE RIGHT MCA STROKE  THERAPY  DIAG:  Difficulty walking  Muscle weakness (generalized)  Rationale for Evaluation and Treatment: Rehabilitation  SUBJECTIVE:                                                                                                                                                                                             SUBJECTIVE STATEMENT: Doing ok today. No new issues. Non compliant with HEP.   Eval: Comes to the clinic with some unsteadiness when walking. Also c/o weakness on the L LE. Reports that the L LE feels like it's going to give way. Patient had CVA in 11/13/22. Patient was admitted to the hospital for the week (including inpatient rehab). Upon D/C patient was then sent to outpatient PT evaluation and management.  Pt accompanied by:  wife  PERTINENT HISTORY: HTN, hx of osteomyelitis of the thoracic spine, hx of L foot fx   PAIN:  Are you having pain? No  PRECAUTIONS: None  WEIGHT BEARING RESTRICTIONS: No  FALLS: Has patient fallen in last 6 months? Yes. Number of falls 1 (10/03/2022), MD already aware  LIVING ENVIRONMENT: Lives with: lives with their spouse Lives in: House/apartment Stairs: Yes: External: 1 steps; none Has following equipment at home: Single point cane, Environmental consultant - 2 wheeled, Environmental consultant - 4 wheeled, and Wheelchair (manual)  PLOF: Independent with community mobility with device [facility])  PATIENT GOALS: "get going and get moving"  OBJECTIVE:   DIAGNOSTIC FINDINGS:  MRI HEAD WITHOUT CONTRAST 11/13/2022   TECHNIQUE: Multiplanar, multiecho pulse sequences of the brain and surrounding structures were obtained without intravenous contrast.   COMPARISON:  12/30/2015   FINDINGS: Brain: Small area of abnormal diffusion restriction within the right corona radiata. Chronic right subdural hematoma. No acute hemorrhage. There is multifocal hyperintense T2-weighted signal within the white matter. Generalized volume loss. The midline structures are normal.   Vascular:  Major flow voids are preserved.   Skull and upper cervical spine: Normal calvarium and skull base. Visualized upper cervical spine and soft tissues are  normal.   Sinuses/Orbits:No paranasal sinus fluid levels or advanced mucosal thickening. No mastoid or middle ear effusion. Normal orbits.   IMPRESSION: 1. Small area of acute or early subacute ischemia within the right corona radiata. 2. Chronic right subdural hematoma.    COGNITION: Overall cognitive status: Within functional limits for tasks assessed   SENSATION: Light touch: WFL  COORDINATION: Intact  MUSCLE TONE: WNL on B LE  MUSCLE LENGTH: Mild tightness on B hamstrings and gastrocsoleus complex  POSTURE: rounded shoulders and forward head  LOWER EXTREMITY ROM:     Active  Right Eval Left Eval  Hip flexion [facility]  Hip extension [facility]  Hip abduction [facility]  Hip adduction    Hip internal rotation    Hip external rotation    Knee flexion [facility] WFL  Knee extension [facility]  Ankle dorsiflexion [facility] WFL  Ankle plantarflexion [facility] WFL  Ankle inversion    Ankle eversion     (Blank rows = not tested)  LOWER EXTREMITY MMT:    MMT Right Eval Left Eval  Hip flexion 4 4+  Hip extension 3+ 3+  Hip abduction 4 4  Hip adduction    Hip internal rotation    Hip external rotation    Knee flexion 4+ 4  Knee extension 4+ 4  Ankle dorsiflexion 4+ 4+  Ankle plantarflexion 4+ 4+  Ankle inversion    Ankle eversion    (Blank rows = not tested)  BED MOBILITY:  Sit to supine Complete Independence Supine to sit Complete Independence Rolling to Right Complete Independence Rolling to Left Complete Independence  TRANSFERS: Assistive device utilized: Single point cane  Sit to stand: Modified independence Stand to sit: Modified independence  GAIT:  Distance walked: 311 ft Assistive device utilized: Single point cane Level of assistance: Modified independence Comments: B LE in ER, wide BOS  FUNCTIONAL TESTS:  5  times sit to stand: 17.83 sec 2 minute walk test: 311 ft with SPC. Tinetti POMA: 16 (balance + gait) done with SPC   TODAY'S TREATMENT:                                                                                                                              DATE:  01/06/23 Nu step lv 2 seat 9 5 min   Standing: Slant board 2 x 30"  Sit to stand x 10 no UE assist from chair Heel raises 20X Toe raises x20  Hip abduction GTB 2 x 10 each Hip extension GTB 2 x 10 each 4" step ups 2 x 10 each   Semi tandem stance 2 x 30" Marching on foam 3 x 30"  Rhomberg on foam 20 sec EO x 3  12/26/22 Sit to stand x 10 no UE assist from chair Standing: Heel raises 20X Hip abduction 2 x 10 each Hip extension 2 x 10 each 4" step ups 2 x 10 leading up with the left 4" lateral  step ups 2 x 10  4" lunges with intermittent HHA X10 each with intermittent HHA  Sitting: 3# LAQs 2" hold x 10 3# hip flexion x 10 RTB  hip abduction 2 x 10   12/16/22 Sit to stand x 10 no UE assist from chair  Sitting: 3# LAQs 2" hold x 10 3# hip flexion x 10 RTB  hip abduction 2 x 10   Standing: Heel raises 2 x 10 Hip abduction 2 x 10 each Hip extension 2 x 10 each 4" step ups 2 x 10 leading up with the left 4" lateral step ups 2 x 10     PATIENT EDUCATION: Education details: Educated on the pathoanatomy of CVA. Educated on the goals and course of rehab. Written HEP provided and reviewed. Education on falls risk reduction at home Person educated: Patient Education method: Explanation, Demonstration, and Handouts Education comprehension: verbalized understanding and returned demonstration  HOME EXERCISE PROGRAM: 12/16/22 hip abduction with theraband  Access Code: [email or web address] URL: [email or web address]  12/09/22   Sit to Stand with Arms Crossed  - 1-2 x daily - 7 x weekly - 2 sets - 10 reps - Heel Raises with Counter Support  - 1-2 x daily - 7 x weekly - 2 sets - 10 reps - Toe Raises with  Counter Support  - 1-2 x daily - 7 x weekly - 2 sets - 10 reps - Standing Tandem Balance with Counter Support  - 1-2 x daily - 7 x weekly - 1 sets - 3 reps - 20 second hold - Standing Hip Abduction with Counter Support  - 1-2 x daily - 7 x weekly - 2 sets - 10 reps   Date: 11/26/2022 Prepared by: Krystal Clark  Exercises - Seated Calf Stretch with Strap  - 1-2 x daily - 5-7 x weekly - 3 reps - 30 hold - Mini Squat with Counter Support  - 1-2 x daily - 5-7 x weekly - 2 sets - 10 reps - 3 hold  GOALS: Goals reviewed with patient? Yes  SHORT TERM GOALS: Target date: 12/24/22   Pt will demonstrate indep in HEP to facilitate carry-over of skilled services and improve functional outcomes Goal status: INITIAL  LONG TERM GOALS: Target date: 01/21/23  Pt will decrease 5TSTS by at least 3 seconds in order to demonstrate clinically significant improvement in LE strength Baseline: 17.83 Goal status: INITIAL  2.  Pt will increase by at least 40 ft in order to demonstrate clinically significant improvement in community ambulation Baseline: 311 ft  Goal status: INITIAL  3.  Patient will demonstrate increase in Tinetti Score by 4 points in order to demonstrate clinically significant improvement in balance and decreased risk for falls Baseline: 16 Goal status: INITIAL  4.  Pt will demonstrate increase in LE strength to 4+/5 to facilitate ease and safety in ambulation  Baseline: 3+/5 Goal status: INITIAL  ASSESSMENT:  CLINICAL IMPRESSION: Patient with good tolerance overall. Showing some improvement with static standing balance. Patient does note increased muscle fatigue with increased band resistance to green. Patient very challenged with dynamic balance with marching on foam pad, requiring min guard for stability. Patient does require a few short rest breaks due to fatigue during session. Discussed importance of regular compliance with HEP for max benefit of therapy. Patient will  continue to benefit from skilled therapy services to reduce remaining deficits and improve functional ability.    OBJECTIVE IMPAIRMENTS: Abnormal gait, difficulty walking, decreased ROM, decreased strength, and impaired flexibility.  ACTIVITY LIMITATIONS: carrying, lifting, bending, standing, and transfers  PARTICIPATION LIMITATIONS: meal prep, cleaning, laundry, and community activity  PERSONAL FACTORS: Age and Fitness are also affecting patient's functional outcome.   REHAB POTENTIAL: Good  CLINICAL DECISION MAKING: Stable/uncomplicated  EVALUATION COMPLEXITY: Low  PLAN:  PT FREQUENCY: 2x/week  PT DURATION: 8 weeks  PLANNED INTERVENTIONS: Therapeutic exercises, Therapeutic activity, Neuromuscular re-education, Balance training, Gait training, Patient/Family education, Self Care, Stair training, and Manual therapy  PLAN FOR NEXT SESSION: Progress LE strengthening and stretching as tolerated. Progress balance and gait activities. Update HEP prn.  1:41 PM, 01/06/23 Georges Lynch PT DPT  Physical Therapist with [facility]  [phone]

## 2023-01-08 ENCOUNTER — Encounter (HOSPITAL_COMMUNITY): Payer: Medicare Other | Admitting: Occupational Therapy

## 2023-01-08 ENCOUNTER — Ambulatory Visit (HOSPITAL_COMMUNITY): Payer: Medicare Other

## 2023-01-08 DIAGNOSIS — R262 Difficulty in walking, not elsewhere classified: Secondary | ICD-10-CM

## 2023-01-08 DIAGNOSIS — M6281 Muscle weakness (generalized): Secondary | ICD-10-CM

## 2023-01-08 NOTE — Progress Notes (Signed)
[address]       [place] [address]             [phone]                    [facility] Medical Record #[id number] Date of Birth: [birth year]  Referring: Si Gaul, MD Primary Care: Billie Lade, MD Primary Cardiologist: None  Chief Complaint:    Chief Complaint  Patient presents with   Lung Cancer    New patient consult, Bronch 3/26, PET 3/21, Chest CT 3/*31,. MR Brain 2/21, PFTs 4/1    History of Present Illness:    Damon Parrish 82 y.o. 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  He recently was discharged from the hospital after his second stroke 2 months.  He has been having some mobility issues as well.  He denies any shortness of breath.  He continues to smoke a pack a day.  He denies any neurologic symptoms.   Zubrod Score: At the time of surgery this patient's most appropriate activity status/level should be described as:     0    Normal activity, no symptoms     1    Restricted in physical strenuous activity but ambulatory, able to do out light work     2    Ambulatory and capable of self care, unable to do work activities, up and about               >50 % of waking hours                                  3    Only limited self care, in bed greater than 50% of waking hours     4    Completely disabled, no self care, confined to bed or chair     5    Moribund   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   GERD (gastroesophageal reflux disease)    H/O cerebral aneurysm repair 2016   "put stent in"   High cholesterol    History of blood transfusion ~ 1950   "while in hospital w/pneumonia" (01/09/2018)   History of kidney stones    Hypertension    Nonruptured cerebral  aneurysm    Pneumonia ~ 1950   Red man syndrome    Seizures 12/27/2015; 12/28/2015   Stroke 12/2015   denies residual on 01/09/2018)    Past Surgical History:  Procedure Laterality Date   BRONCHIAL BIOPSY  12/17/2022   Procedure: BRONCHIAL BIOPSIES;  Surgeon: Josephine Igo, DO;  Location: [facility];  Service: Pulmonary;;   BRONCHIAL NEEDLE ASPIRATION BIOPSY  12/17/2022   Procedure: BRONCHIAL NEEDLE ASPIRATION BIOPSIES;  Surgeon: Josephine Igo, DO;  Location: [facility];  Service: Pulmonary;;   CATARACT EXTRACTION W/ INTRAOCULAR LENS  IMPLANT, BILATERAL Bilateral    COLONOSCOPY     2013 per patient: done in [place], normal, next colonoscopy due in 10 years   EYE SURGERY Left 06/17/2022   FOOT FRACTURE SURGERY Left    "pins, etc. in there"   FRACTURE SURGERY  LOOP RECORDER INSERTION N/A 11/15/2022   Procedure: LOOP RECORDER INSERTION;  Surgeon: Maurice Small, MD;  Location: [facility];  Service: Cardiovascular;  Laterality: N/A;   RADIOLOGY WITH ANESTHESIA N/A 05/04/2015   Procedure: Pipeline Embolization;  Surgeon: Lisbeth Renshaw, MD;  Location: [facility]URO ORS;  Service: Radiology;  Laterality: N/A;   RADIOLOGY WITH ANESTHESIA N/A 05/31/2015   Procedure: Pipeline Embolization;  Surgeon: Lisbeth Renshaw, MD;  Location: [facility] OR;  Service: Radiology;  Laterality: N/A;    Family History  Problem Relation Age of Onset   Lymphoma Mother    Dementia Mother    Aneurysm Father    Colon cancer Neg Hx      Social History   Tobacco Use  Smoking Status Every Day   Packs/day: 1.00   Years: 61.00   Additional pack years: 0.00   Total pack years: 61.00   Types: Cigarettes  Smokeless Tobacco Never  Tobacco Comments   Smokes 4 packs of cigarettes in a week. 12/05/22 [place]   1/2 ppd per pt 01/10/23    Social History   Substance and Sexual Activity  Alcohol Use No   Alcohol/week: 0.0 standard drinks of alcohol     Allergies  Allergen Reactions    Cephalosporins Rash    Full body rash with systemic symptoms   Vancomycin Rash    Full body rash with systemic symptoms    Current Outpatient Medications  Medication Sig Dispense Refill   acetaminophen (TYLENOL) 325 MG tablet Take 1-2 tablets (325-650 mg total) by mouth every 4 (four) hours as needed for mild pain.     amLODipine (NORVASC) 5 MG tablet Take 1 tablet (5 mg total) by mouth daily. 30 tablet 2   Ascorbic Acid (VITAMIN C) 1000 MG tablet Take 1,000 mg by mouth daily.     atorvastatin (LIPITOR) 80 MG tablet Take 1 tablet (80 mg total) by mouth at bedtime. 30 tablet 0   bimatoprost (LUMIGAN) 0.03 % ophthalmic solution Place 1 drop into the right eye at bedtime.     Brinzolamide-Brimonidine (SIMBRINZA) 1-0.2 % SUSP Place 1 drop into the right eye 2 (two) times daily.     calcium carbonate (OSCAL) 1500 (600 Ca) MG TABS tablet Take 600 mg of elemental calcium by mouth daily with breakfast.     Cholecalciferol 125 MCG (5000 UT) TABS Take 5,000 Units by mouth daily.     clopidogrel (PLAVIX) 75 MG tablet Take 1 tablet (75 mg total) by mouth daily. 90 tablet 3   finasteride (PROSCAR) 5 MG tablet Take 1 tablet (5 mg total) by mouth daily. 30 tablet 0   fluticasone (FLONASE) 50 MCG/ACT nasal spray Place 2 sprays into both nostrils daily.     levETIRAcetam (KEPPRA) 500 MG tablet Take 1 tablet (500 mg total) by mouth 2 (two) times daily. 180 tablet 3   Multiple Vitamin (MULTIVITAMIN) capsule Take 1 capsule by mouth daily. 50+     multivitamin-lutein (OCUVITE-LUTEIN) CAPS capsule Take 1 capsule by mouth daily.     prednisoLONE acetate (PRED FORTE) 1 % ophthalmic suspension Place 1 drop into the left eye in the morning.     senna-docusate (SENOKOT-S) 8.6-50 MG tablet Take 2 tablets by mouth at bedtime as needed for mild constipation. (Patient taking differently: Take 1 tablet by mouth daily.)     tamsulosin (FLOMAX) 0.4 MG CAPS capsule Take 1 capsule (0.4 mg total) by mouth daily after supper.      timolol (TIMOPTIC) 0.25 % ophthalmic solution  Place 1 drop into the right eye 2 (two) times daily.     TRELEGY ELLIPTA 100-62.5-25 MCG/ACT AEPB Inhale 1 puff into the lungs daily.     vitamin E 400 UNIT capsule Take 400 Units by mouth daily.     albuterol (PROVENTIL) (2.5 MG/3ML) 0.083% nebulizer solution Take 2.5 mg by nebulization every 6 (six) hours as needed for wheezing or shortness of breath. (Patient not taking: Reported on 01/10/2023)     No current facility-administered medications for this visit.    Review of Systems  Constitutional:  Positive for malaise/fatigue.  Respiratory:  Positive for shortness of breath. Negative for cough.   Cardiovascular:  Negative for chest pain.  Neurological:  Positive for dizziness.     PHYSICAL EXAMINATION: BP 127/76 (BP Location: Right Arm, Patient Position: Sitting, Cuff Size: Normal)   Pulse 66   Resp 20   Ht 5\' 9"  (1.753 m)   Wt 182 lb (82.6 kg)   SpO2 96% Comment: RA  BMI 26.88 kg/m  Physical Exam Constitutional:      General: He is not in acute distress.    Appearance: He is normal weight. He is not ill-appearing.  HENT:     Head: Normocephalic and atraumatic.  Eyes:     Extraocular Movements: Extraocular movements intact.  Cardiovascular:     Rate and Rhythm: Normal rate.  Pulmonary:     Effort: No respiratory distress.  Abdominal:     General: There is no distension.  Musculoskeletal:     Cervical back: Normal range of motion.     Comments: Walks with a cane. Unstable when getting out of the chair.  Skin:    General: Skin is warm and dry.  Neurological:     General: No focal deficit present.     Mental Status: He is alert and oriented to person, place, and time.     Diagnostic Studies & Laboratory data:     Recent Radiology Findings:   CUP PACEART REMOTE DEVICE CHECK  Result Date: 12/19/2022 ILR summary report received. Battery status OK. Normal device function. No new symptom, tachy, or  brady episodes. No  new AF episodes. 1 Pause episode recorded on 12/15/22, previously triaged to clinic on 12/15/22 for complete HB followed by Mobitz 2. Monthly summary reports and ROV/PRN. [facility], [facility]. ILR summary report received. Battery status OK. Normal device function. No new symptom, tachy, or  brady episodes. No new AF episodes. 1 Pause episode recorded on 12/15/22, previously triaged to clinic on 12/15/22 for complete HB followed by Mobitz 2.   Monthly summary reports and ROV/PRN. [facility], [facility]. ILR summary report received. Battery status OK. Normal device function. No new symptom, tachy, or  brady episodes. No new AF episodes.  1 Pause episode recorded on 12/15/22, previously triaged to clinic on 12/15/22 for complete HB followed by Mobitz 2.   Monthly summary reports and ROV/PRN. [facility], [facility].  DG Chest Port 1 View  Result Date: 12/17/2022 CLINICAL DATA:  Status post bronchoscopy with biopsy. EXAM: PORTABLE CHEST 1 VIEW COMPARISON:  January 09, 2018. FINDINGS: The heart size and mediastinal contours are within normal limits. No pneumothorax is noted. No acute pulmonary disease is noted. The visualized skeletal structures are unremarkable. IMPRESSION: No active disease. Electronically Signed   By: Lupita Raider M.D.   On: 12/17/2022 12:30   DG C-ARM BRONCHOSCOPY  Result Date: 12/17/2022 C-ARM BRONCHOSCOPY: Fluoroscopy was utilized by the requesting physician.  No radiographic interpretation.   NM PET Image Initial (  PI) Skull Base To Thigh (F-18 FDG)  Result Date: 12/14/2022 CLINICAL DATA:  Initial treatment strategy for right upper lobe pulmonary nodule. EXAM: NUCLEAR MEDICINE PET SKULL BASE TO THIGH TECHNIQUE: 9.7 mCi F-18 FDG was injected intravenously. Full-ring PET imaging was performed from the skull base to thigh after the radiotracer. CT data was obtained and used for attenuation correction and anatomic localization. Fasting blood glucose: 103 mg/dl COMPARISON:  Concurrent CT chest dated 12/12/2022. Prior CT chest dated  11/13/2022. FINDINGS: Mediastinal blood pool activity: SUV max 2.7 Liver activity: SUV max NA NECK: No hypermetabolic cervical lymphadenopathy. Incidental CT findings: None. CHEST: 15 x 8 mm medial right apical lung nodule (series 9/image 22), described on concurrent CT chest, max SUV 5.5. This is suspicious for primary bronchogenic neoplasm. No hypermetabolic thoracic lymphadenopathy. Small mediastinal nodes, likely reactive. Incidental CT findings: Moderate centrilobular and paraseptal emphysematous changes, upper lung predominant. Atherosclerotic calcifications of the aortic arch. Moderate three-vessel coronary atherosclerosis. ABDOMEN/PELVIS: No abnormal hypermetabolism in the liver, spleen, pancreas, or adrenal glands. No hypermetabolic abdominopelvic lymphadenopathy. Incidental CT findings: Tiny layering gallstone (series 5/image 150), without associated inflammatory changes. Atherosclerotic calcifications the abdominal aorta and branch vessels. Extensive left colonic diverticulosis, without evidence of diverticulitis. SKELETON: No focal hypermetabolic activity to suggest skeletal metastasis. Incidental CT findings: Degenerative changes of the visualized thoracolumbar spine. IMPRESSION: Hypermetabolic medial right apical lung nodule, suspicious for primary bronchogenic neoplasm. No evidence of metastatic disease. Electronically Signed   By: Charline Bills M.D.   On: 12/14/2022 01:44   CT Super D Chest Wo Contrast  Result Date: 12/14/2022 CLINICAL DATA:  Right upper lobe pulmonary nodule EXAM: CT CHEST WITHOUT CONTRAST TECHNIQUE: Multidetector CT imaging of the chest was performed using thin slice collimation for electromagnetic bronchoscopy planning purposes, without intravenous contrast. RADIATION DOSE REDUCTION: This exam was performed according to the departmental dose-optimization program which includes automated exposure control, adjustment of the mA and/or kV according to patient size and/or use  of iterative reconstruction technique. COMPARISON:  CT chest dated 11/13/2022 FINDINGS: Cardiovascular: Heart is normal in size.  No pericardial effusion. No evidence of thoracic aortic aneurysm. Atherosclerotic calcifications of the arch. Moderate three-vessel coronary atherosclerosis. Mediastinum/Nodes: Small mediastinal lymph nodes which do not meet pathologic CT size criteria. Visualized thyroid is unremarkable. Lungs/Pleura: 15 x 8 mm irregular medial right apical nodule (series 4/image 26), grossly unchanged. Moderate centrilobular and paraseptal emphysematous changes, upper lung predominant. Calcified granuloma in the posterior left upper lobe (series 4/image 58), benign. No focal consolidation.  Mild bibasilar atelectasis. No pleural effusion or pneumothorax. Upper Abdomen: Visualized upper abdomen is grossly unremarkable, noting vascular calcifications. Musculoskeletal: Fusion at T9-10. Degenerative changes of the visualized thoracolumbar spine. IMPRESSION: 15 x 8 mm irregular medial right apical nodule, grossly unchanged, suspicious for primary bronchogenic neoplasm. No findings suspicious for metastatic disease. Concurrent PET-CT has been performed and will be reported separately. Aortic Atherosclerosis (ICD10-I70.0) and Emphysema (ICD10-J43.9). Electronically Signed   By: Charline Bills M.D.   On: 12/14/2022 01:39       I have independently reviewed the above radiology studies  and reviewed the findings with the patient.   Recent Lab Findings: Lab Results  Component Value Date   WBC 7.3 12/02/2022   HGB 14.5 12/02/2022   HCT 41.4 12/02/2022   PLT 259 12/02/2022   GLUCOSE 127 (H) 12/02/2022   CHOL 146 11/14/2022   TRIG 96 11/14/2022   HDL 46 11/14/2022   LDLCALC 81 11/14/2022   ALT 19 12/02/2022   AST 17  12/02/2022   NA 134 (L) 12/02/2022   K 4.3 12/02/2022   CL 99 12/02/2022   CREATININE 1.03 12/02/2022   BUN 14 12/02/2022   CO2 29 12/02/2022   TSH 1.263 12/16/2017   INR 1.1  11/13/2022   HGBA1C 6.1 (H) 11/13/2022     PFTs:  - FVC: 77% - FEV1: 70% -DLCO: 66%  PET CT: CHEST: 15 x 8 mm medial right apical lung nodule (series 9/image 22), described on concurrent CT chest, max SUV 5.5. This is suspicious for primary bronchogenic neoplasm.   No hypermetabolic thoracic lymphadenopathy. Small mediastinal nodes, likely reactive.    FINAL MICROSCOPIC DIAGNOSIS:  A.  RIGHT LUNG, UPPER LOBE, NODULE, FINE NEEDLE ASPIRATION AND BIOPSIES:  - Malignant  - Non-small cell carcinoma compatible with adenocarcinoma    Assessment / Plan:   82 yo male with 1.5cm right upper lobe NSCLC.  Pulmonary functions testing is adequate.  There is no evidence of metastatic disease.  I had a very long discussion with him and his wife.  He has difficult time recovering from these 2 strokes.  He has had some instability with walking.  During his walk test today he almost tripped while getting out of the chair.  His wife is concerned that he may be too frail for surgical resection.  I agree with their assessment.  Made referral for SBRT.  This likely will be a better course given his age and frailty.  I  spent 40 minutes with  the patient face to face in counseling and coordination of care.    Corliss Skains 01/10/2023 1:30 PM

## 2023-01-08 NOTE — Therapy (Signed)
OUTPATIENT PHYSICAL THERAPY TREATMENT  Patient Name: Damon Parrish MRN: [id number] DOB:1941-01-03, 82 y.o., male Today's Date: 01/08/2023   PCP: Elise Benne, MD REFERRING PROVIDER: Charlton Amor, PA-C  END OF SESSION:  PT End of Session - 01/08/23 1033     Visit Number 7    Number of Visits 16    Date for PT Re-Evaluation 01/21/23    Authorization Type Medicare    Progress Note Due on Visit 8    PT Start Time 1030    PT Stop Time 1110    PT Time Calculation (min) 40 min    Activity Tolerance Patient tolerated treatment well    Behavior During Therapy WFL for tasks assessed/performed            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   GERD (gastroesophageal reflux disease)    H/O cerebral aneurysm repair 2016   "put stent in"   High cholesterol    History of blood transfusion ~ 1950   "while in hospital w/pneumonia" (01/09/2018)   History of kidney stones    Hypertension    Nonruptured cerebral aneurysm    Pneumonia ~ 1950   Red man syndrome    Seizures 12/27/2015; 12/28/2015   Stroke 12/2015   denies residual on 01/09/2018)   Past Surgical History:  Procedure Laterality Date   BRONCHIAL BIOPSY  12/17/2022   Procedure: BRONCHIAL BIOPSIES;  Surgeon: Josephine Igo, DO;  Location: [facility];  Service: Pulmonary;;   BRONCHIAL NEEDLE ASPIRATION BIOPSY  12/17/2022   Procedure: BRONCHIAL NEEDLE ASPIRATION BIOPSIES;  Surgeon: Josephine Igo, DO;  Location: [facility];  Service: Pulmonary;;   CATARACT EXTRACTION W/ INTRAOCULAR LENS  IMPLANT, BILATERAL Bilateral    COLONOSCOPY     2013 per patient: done in [place], normal, next colonoscopy due in 10 years   EYE SURGERY Left 06/17/2022   FOOT FRACTURE SURGERY Left    "pins, etc. in there"   FRACTURE SURGERY     LOOP RECORDER  INSERTION N/A 11/15/2022   Procedure: LOOP RECORDER INSERTION;  Surgeon: Maurice Small, MD;  Location: [facility];  Service: Cardiovascular;  Laterality: N/A;   RADIOLOGY WITH ANESTHESIA N/A 05/04/2015   Procedure: Pipeline Embolization;  Surgeon: Lisbeth Renshaw, MD;  Location: [facility];  Service: Radiology;  Laterality: N/A;   RADIOLOGY WITH ANESTHESIA N/A 05/31/2015   Procedure: Pipeline Embolization;  Surgeon: Lisbeth Renshaw, MD;  Location: [facility];  Service: Radiology;  Laterality: N/A;   Patient Active Problem List   Diagnosis Date Noted   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   BPH (benign prostatic hyperplasia) 12/31/2022   Tobacco use 12/31/2022   Prediabetes 12/31/2022   Primary open angle glaucoma, both eyes 12/31/2022   Emphysema lung 12/31/2022   Encounter for general adult medical examination with abnormal findings 12/31/2022   Lung nodule 12/05/2022   CVA (cerebral vascular accident) 11/14/2022   Seizure disorder 11/13/2022   Pulmonary nodule 11/13/2022   Cough 11/13/2022   Left leg numbness    Muscle fasciculation    Focal motor seizure 01/07/2019   Allergic reaction    DRESS syndrome    Pressure injury of skin 01/09/2018   Adverse drug reaction 01/08/2018   Dermatitis, drug-induced 01/08/2018   Discitis 12/17/2017  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   Rectal bleeding    Constipation 12/16/2017   Cerebrovascular accident (CVA) due to embolism of right middle cerebral artery 03/06/2016   S/P cerebral aneurysm repair 03/06/2016   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   Palpitations 01/05/2016   Aneurysm, cerebral, nonruptured    Hyperlipidemia    Acute CVA (cerebrovascular accident) 12/30/2015   Acute right MCA stroke    Focal seizure 12/28/2015   HTN (hypertension) 12/28/2015   Hyponatremia 12/28/2015   Cerebral aneurysm 05/04/2015    ONSET DATE: 11/13/2022  REFERRING DIAG: ACUTE RIGHT MCA STROKE  THERAPY  DIAG:  Difficulty walking  Muscle weakness (generalized)  Rationale for Evaluation and Treatment: Rehabilitation  SUBJECTIVE:                                                                                                                                                                                             SUBJECTIVE STATEMENT: Patient reports no pain today; states he is not really doing his HEP " I should be doing them"  Eval: Comes to the clinic with some unsteadiness when walking. Also c/o weakness on the L LE. Reports that the L LE feels like it's going to give way. Patient had CVA in 11/13/22. Patient was admitted to the hospital for the week (including inpatient rehab). Upon D/C patient was then sent to outpatient PT evaluation and management.  Pt accompanied by:  wife  PERTINENT HISTORY: HTN, hx of osteomyelitis of the thoracic spine, hx of L foot fx   PAIN:  Are you having pain? No  PRECAUTIONS: None  WEIGHT BEARING RESTRICTIONS: No  FALLS: Has patient fallen in last 6 months? Yes. Number of falls 1 (10/03/2022), MD already aware  LIVING ENVIRONMENT: Lives with: lives with their spouse Lives in: House/apartment Stairs: Yes: External: 1 steps; none Has following equipment at home: Single point cane, Environmental consultant - 2 wheeled, Environmental consultant - 4 wheeled, and Wheelchair (manual)  PLOF: Independent with community mobility with device [facility])  PATIENT GOALS: "get going and get moving"  OBJECTIVE:   DIAGNOSTIC FINDINGS:  MRI HEAD WITHOUT CONTRAST 11/13/2022   TECHNIQUE: Multiplanar, multiecho pulse sequences of the brain and surrounding structures were obtained without intravenous contrast.   COMPARISON:  12/30/2015   FINDINGS: Brain: Small area of abnormal diffusion restriction within the right corona radiata. Chronic right subdural hematoma. No acute hemorrhage. There is multifocal hyperintense T2-weighted signal within the white matter. Generalized volume loss. The  midline structures are normal.   Vascular: Major flow voids are preserved.   Skull and upper cervical spine: Normal calvarium and skull base.  Visualized upper cervical spine and soft tissues are normal.   Sinuses/Orbits:No paranasal sinus fluid levels or advanced mucosal thickening. No mastoid or middle ear effusion. Normal orbits.   IMPRESSION: 1. Small area of acute or early subacute ischemia within the right corona radiata. 2. Chronic right subdural hematoma.    COGNITION: Overall cognitive status: Within functional limits for tasks assessed   SENSATION: Light touch: WFL  COORDINATION: Intact  MUSCLE TONE: WNL on B LE  MUSCLE LENGTH: Mild tightness on B hamstrings and gastrocsoleus complex  POSTURE: rounded shoulders and forward head  LOWER EXTREMITY ROM:     Active  Right Eval Left Eval  Hip flexion [facility]  Hip extension [facility]  Hip abduction [facility]  Hip adduction    Hip internal rotation    Hip external rotation    Knee flexion [facility] WFL  Knee extension [facility]  Ankle dorsiflexion [facility] WFL  Ankle plantarflexion [facility] WFL  Ankle inversion    Ankle eversion     (Blank rows = not tested)  LOWER EXTREMITY MMT:    MMT Right Eval Left Eval  Hip flexion 4 4+  Hip extension 3+ 3+  Hip abduction 4 4  Hip adduction    Hip internal rotation    Hip external rotation    Knee flexion 4+ 4  Knee extension 4+ 4  Ankle dorsiflexion 4+ 4+  Ankle plantarflexion 4+ 4+  Ankle inversion    Ankle eversion    (Blank rows = not tested)  BED MOBILITY:  Sit to supine Complete Independence Supine to sit Complete Independence Rolling to Right Complete Independence Rolling to Left Complete Independence  TRANSFERS: Assistive device utilized: Single point cane  Sit to stand: Modified independence Stand to sit: Modified independence  GAIT:  Distance walked: 311 ft Assistive device utilized: Single point cane Level of assistance: Modified independence Comments:  B LE in ER, wide BOS  FUNCTIONAL TESTS:  5 times sit to stand: 17.83 sec 2 minute walk test: 311 ft with SPC. Tinetti POMA: 16 (balance + gait) done with SPC   TODAY'S TREATMENT:                                                                                                                              DATE:  01/08/23 Nu step level 2 seat 9 x 5 min  Sit to stand no UE assist 2 x 5  Standing: Heel/toe raises 2 x 10 Slant board 3 x 20" Hip abduction 3 x 10; last set with GTB Hip extension GTB 2 x 10 4" box step ups x 10 each 4" box lateral step ups x 10 each Standing on foam small BOS 3 x 30" no UE assist   01/06/23 Nu step lv 2 seat 9 5 min   Standing: Slant board 2 x 30"  Sit to stand x 10 no UE assist from chair Heel raises 20X Toe raises x20  Hip abduction  GTB 2 x 10 each Hip extension GTB 2 x 10 each 4" step ups 2 x 10 each   Semi tandem stance 2 x 30" Marching on foam 3 x 30"  Rhomberg on foam 20 sec EO x 3  12/26/22 Sit to stand x 10 no UE assist from chair Standing: Heel raises 20X Hip abduction 2 x 10 each Hip extension 2 x 10 each 4" step ups 2 x 10 leading up with the left 4" lateral step ups 2 x 10  4" lunges with intermittent HHA X10 each with intermittent HHA  Sitting: 3# LAQs 2" hold x 10 3# hip flexion x 10 RTB  hip abduction 2 x 10   12/16/22 Sit to stand x 10 no UE assist from chair  Sitting: 3# LAQs 2" hold x 10 3# hip flexion x 10 RTB  hip abduction 2 x 10   Standing: Heel raises 2 x 10 Hip abduction 2 x 10 each Hip extension 2 x 10 each 4" step ups 2 x 10 leading up with the left 4" lateral step ups 2 x 10     PATIENT EDUCATION: Education details: Educated on the pathoanatomy of CVA. Educated on the goals and course of rehab. Written HEP provided and reviewed. Education on falls risk reduction at home Person educated: Patient Education method: Explanation, Demonstration, and Handouts Education comprehension: verbalized  understanding and returned demonstration  HOME EXERCISE PROGRAM: 12/16/22 hip abduction with theraband  Access Code: [email or web address] URL: [email or web address]  12/09/22   Sit to Stand with Arms Crossed  - 1-2 x daily - 7 x weekly - 2 sets - 10 reps - Heel Raises with Counter Support  - 1-2 x daily - 7 x weekly - 2 sets - 10 reps - Toe Raises with Counter Support  - 1-2 x daily - 7 x weekly - 2 sets - 10 reps - Standing Tandem Balance with Counter Support  - 1-2 x daily - 7 x weekly - 1 sets - 3 reps - 20 second hold - Standing Hip Abduction with Counter Support  - 1-2 x daily - 7 x weekly - 2 sets - 10 reps   Date: 11/26/2022 Prepared by: Krystal Clark  Exercises - Seated Calf Stretch with Strap  - 1-2 x daily - 5-7 x weekly - 3 reps - 30 hold - Mini Squat with Counter Support  - 1-2 x daily - 5-7 x weekly - 2 sets - 10 reps - 3 hold  GOALS: Goals reviewed with patient? Yes  SHORT TERM GOALS: Target date: 12/24/22   Pt will demonstrate indep in HEP to facilitate carry-over of skilled services and improve functional outcomes Goal status: IN PROGRESS  LONG TERM GOALS: Target date: 01/21/23  Pt will decrease 5TSTS by at least 3 seconds in order to demonstrate clinically significant improvement in LE strength Baseline: 17.83 Goal status: IN PROGRESS  2.  Pt will increase by at least 40 ft in order to demonstrate clinically significant improvement in community ambulation Baseline: 311 ft  Goal status: IN PROGRESS  3.  Patient will demonstrate increase in Tinetti Score by 4 points in order to demonstrate clinically significant improvement in balance and decreased risk for falls Baseline: 16 Goal status: IN PROGRESS  4.  Pt will demonstrate increase in LE strength to 4+/5 to facilitate ease and safety in ambulation  Baseline: 3+/5 Goal status: IN PROGRESS  ASSESSMENT:  CLINICAL IMPRESSION: Today's session continued to address lower extremity weakness and  balance  impairments.  Continued to encourage patient compliance with HEP; patient appears to be more focused on his lung cancer at this time.  Continues with mild balance impairment but noted improvement; needs a cane for safety with ambulation.  Patient will continue to benefit from skilled therapy services to reduce remaining deficits and improve functional ability.    OBJECTIVE IMPAIRMENTS: Abnormal gait, difficulty walking, decreased ROM, decreased strength, and impaired flexibility.   ACTIVITY LIMITATIONS: carrying, lifting, bending, standing, and transfers  PARTICIPATION LIMITATIONS: meal prep, cleaning, laundry, and community activity  PERSONAL FACTORS: Age and Fitness are also affecting patient's functional outcome.   REHAB POTENTIAL: Good  CLINICAL DECISION MAKING: Stable/uncomplicated  EVALUATION COMPLEXITY: Low  PLAN:  PT FREQUENCY: 2x/week  PT DURATION: 8 weeks  PLANNED INTERVENTIONS: Therapeutic exercises, Therapeutic activity, Neuromuscular re-education, Balance training, Gait training, Patient/Family education, Self Care, Stair training, and Manual therapy  PLAN FOR NEXT SESSION: Progress LE strengthening and stretching as tolerated. Progress balance and gait activities. Update HEP prn. 2 more visits and then reassess  11:18 AM, 01/08/23 Linkon Siverson Small Kiarra Kidd MPT [place] [facility] [place] [phone]

## 2023-01-10 ENCOUNTER — Institutional Professional Consult (permissible substitution) (INDEPENDENT_AMBULATORY_CARE_PROVIDER_SITE_OTHER): Payer: Medicare Other | Admitting: Thoracic Surgery (Cardiothoracic Vascular Surgery)

## 2023-01-10 ENCOUNTER — Encounter: Payer: Self-pay | Admitting: Thoracic Surgery (Cardiothoracic Vascular Surgery)

## 2023-01-10 VITALS — BP 127/76 | HR 66 | Resp 20 | Ht 69.0 in | Wt 182.0 lb

## 2023-01-10 DIAGNOSIS — C3491 Malignant neoplasm of unspecified part of right bronchus or lung: Secondary | ICD-10-CM | POA: Diagnosis not present

## 2023-01-13 ENCOUNTER — Ambulatory Visit (HOSPITAL_COMMUNITY): Payer: Medicare Other | Admitting: Physical Therapy

## 2023-01-13 DIAGNOSIS — M6281 Muscle weakness (generalized): Secondary | ICD-10-CM

## 2023-01-13 DIAGNOSIS — R262 Difficulty in walking, not elsewhere classified: Secondary | ICD-10-CM | POA: Diagnosis not present

## 2023-01-13 NOTE — Progress Notes (Signed)
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 A diagnosis of Malignant neoplasm of right upper lobe of lung was also pertinent to this visit.   HISTORY OF PRESENT ILLNESS: Damon Parrish is a 82 y.o. male seen at the request of Dr. Cliffton Asters for a diagnosis of lung cancer.  The patient was found to have recent stroke and during his workup with a CT angio of the neck, a mass was seen in the right upper lobe.  An MRI of the brain without contrast on 11/13/2018 for the same day was negative for malignancy but showed acute to early subacute ischemia in the right corona radiata and chronic right subdural hematoma.  A CT chest that day without contrast measured the area seen on the neck scan in the right upper lobe to be 1.5 cm.  No evidence of adenopathy was appreciated.  On 12/12/2022 he underwent bronchoscopy and fine-needle aspirate from cytology showed non-small cell carcinoma consistent with adenocarcinoma.  He did have a PET scan as well on 12/12/2022 that showed hypermetabolic activity with an SUV of 5.5 in the right apex lesion.  He met with Dr. Cliffton Asters to discuss surgery after having adequate pulmonary function testing.  Given his recent stroke and difficulty with walking, it was felt that he was not best suited for surgical resection and rather would be a better candidate for stereotactic body radiation.  He is seen today to discuss this treatment.   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   GERD (gastroesophageal reflux disease)    H/O cerebral aneurysm repair 2016   "put stent in"   High cholesterol    History of blood transfusion ~ 1950   "while in hospital w/pneumonia" (01/09/2018)   History of kidney stones    Hypertension    Nonruptured cerebral aneurysm    Pneumonia ~ 1950   Red man syndrome    Seizures 12/27/2015; 12/28/2015   Stroke 12/2015   denies residual on 01/09/2018)       PAST SURGICAL HISTORY: Past Surgical History:  Procedure Laterality Date   BRONCHIAL BIOPSY  12/17/2022   Procedure: BRONCHIAL BIOPSIES;  Surgeon: Josephine Igo, DO;  Location: [facility];  Service: Pulmonary;;   BRONCHIAL NEEDLE ASPIRATION BIOPSY  12/17/2022   Procedure: BRONCHIAL NEEDLE ASPIRATION BIOPSIES;  Surgeon: Josephine Igo, DO;  Location: [facility];  Service: Pulmonary;;   CATARACT EXTRACTION W/ INTRAOCULAR LENS  IMPLANT, BILATERAL Bilateral    COLONOSCOPY     2013 per patient: done in [place], normal, next colonoscopy due in 10 years   EYE SURGERY Left 06/17/2022   FOOT FRACTURE SURGERY Left    "pins, etc. in there"   FRACTURE SURGERY     LOOP RECORDER INSERTION N/A 11/15/2022   Procedure: LOOP RECORDER INSERTION;  Surgeon: Maurice Small, MD;  Location: [facility];  Service: Cardiovascular;  Laterality: N/A;  RADIOLOGY WITH ANESTHESIA N/A 05/04/2015   Procedure: Pipeline Embolization;  Surgeon: Lisbeth Renshaw, MD;  Location: [facility];  Service: Radiology;  Laterality: N/A;   RADIOLOGY WITH ANESTHESIA N/A 05/31/2015   Procedure: Pipeline Embolization;  Surgeon: Lisbeth Renshaw, MD;  [facility];  Service: Radiology;  Laterality: N/A;     FAMILY HISTORY:  Family History  Problem Relation Age of Onset   Lymphoma Mother    Dementia Mother    Aneurysm Father    Colon cancer Neg Hx      SOCIAL HISTORY:  reports that he has been smoking cigarettes. He has a 61.00 pack-year smoking history. He has never used smokeless  tobacco. He reports that he does not drink alcohol and does not use drugs. He is married and accompanied by his wife. He lives in [place].    ALLERGIES: Cephalosporins and Vancomycin   MEDICATIONS:  Current Outpatient Medications  Medication Sig Dispense Refill   acetaminophen (TYLENOL) 325 MG tablet Take 1-2 tablets (325-650 mg total) by mouth every 4 (four) hours as needed for mild pain.     amLODipine (NORVASC) 5 MG tablet Take 1 tablet (5 mg total) by mouth daily. 30 tablet 2   Ascorbic Acid (VITAMIN C) 1000 MG tablet Take 1,000 mg by mouth daily.     atorvastatin (LIPITOR) 80 MG tablet Take 1 tablet (80 mg total) by mouth at bedtime. 30 tablet 0   bimatoprost (LUMIGAN) 0.03 % ophthalmic solution Place 1 drop into the right eye at bedtime.     Brinzolamide-Brimonidine (SIMBRINZA) 1-0.2 % SUSP Place 1 drop into the right eye 2 (two) times daily.     calcium carbonate (OSCAL) 1500 (600 Ca) MG TABS tablet Take 600 mg of elemental calcium by mouth daily with breakfast.     Cholecalciferol 125 MCG (5000 UT) TABS Take 5,000 Units by mouth daily.     clopidogrel (PLAVIX) 75 MG tablet Take 1 tablet (75 mg total) by mouth daily. 90 tablet 3   finasteride (PROSCAR) 5 MG tablet Take 1 tablet (5 mg total) by mouth daily. 30 tablet 0   fluticasone (FLONASE) 50 MCG/ACT nasal spray Place 2 sprays into both nostrils daily.     levETIRAcetam (KEPPRA) 500 MG tablet Take 1 tablet (500 mg total) by mouth 2 (two) times daily. 180 tablet 3   Multiple Vitamin (MULTIVITAMIN) capsule Take 1 capsule by mouth daily. 50+     multivitamin-lutein (OCUVITE-LUTEIN) CAPS capsule Take 1 capsule by mouth daily.     prednisoLONE acetate (PRED FORTE) 1 % ophthalmic suspension Place 1 drop into the left eye in the morning.     senna-docusate (SENOKOT-S) 8.6-50 MG tablet Take 2 tablets by mouth at bedtime as needed for mild constipation. (Patient taking differently: Take 1 tablet by mouth daily.)     tamsulosin (FLOMAX) 0.4 MG  CAPS capsule Take 1 capsule (0.4 mg total) by mouth daily after supper.     timolol (TIMOPTIC) 0.25 % ophthalmic solution Place 1 drop into the right eye 2 (two) times daily.     TRELEGY ELLIPTA 100-62.5-25 MCG/ACT AEPB Inhale 1 puff into the lungs daily.     vitamin E 400 UNIT capsule Take 400 Units by mouth daily.     albuterol (PROVENTIL) (2.5 MG/3ML) 0.083% nebulizer solution Take 2.5 mg by nebulization every 6 (six) hours as needed for wheezing or shortness of breath. (Patient not taking: Reported on 01/10/2023)     No current facility-administered medications for this encounter.  REVIEW OF SYSTEMS: On review of systems, the patient reports that he is doing well overall. He reports weakness and isn't able to get up to walk as quickly as prior to his stroke. No other persistent symptoms are noted. He denies any chest pain. He does have some shortness of breath with exertion and has a productive cough with occasional productive cough. No other complaints are noted.     PHYSICAL EXAM:  Wt Readings from Last 3 Encounters:  01/15/23 184 lb 4 oz (83.6 kg)  01/10/23 182 lb (82.6 kg)  12/31/22 180 lb (81.6 kg)   Temp Readings from Last 3 Encounters:  01/15/23 (!) 97 F (36.1 C) (Temporal)  12/23/22 98.1 F (36.7 C) (Oral)  12/17/22 (!) 97 F (36.1 C)   BP Readings from Last 3 Encounters:  01/15/23 132/80  01/10/23 127/76  12/31/22 117/74   Pulse Readings from Last 3 Encounters:  01/15/23 65  01/10/23 66  12/31/22 66   Pain Assessment Pain Score: 0-No pain/10  In general this is a well appearing caucasian male in no acute distress. He's alert and oriented x4 and appropriate throughout the examination. Cardiopulmonary assessment is negative for acute distress and he exhibits normal effort.     ECOG = 1  0 - Asymptomatic (Fully active, able to carry on all predisease activities without restriction)  1 - Symptomatic but completely ambulatory (Restricted in physically  strenuous activity but ambulatory and able to carry out work of a light or sedentary nature. For example, light housework, office work)  2 - Symptomatic, <50% in bed during the day (Ambulatory and capable of all self care but unable to carry out any work activities. Up and about more than 50% of waking hours)  3 - Symptomatic, >50% in bed, but not bedbound (Capable of only limited self-care, confined to bed or chair 50% or more of waking hours)  4 - Bedbound (Completely disabled. Cannot carry on any self-care. Totally confined to bed or chair)  5 - Death   Santiago Glad MM, Creech RH, Tormey DC, et al. [phone]). "Toxicity and response criteria of the [facility]". Am. Evlyn Clines. Oncol. 5 (6): 649-55    LABORATORY DATA:  Lab Results  Component Value Date   WBC 7.3 12/02/2022   HGB 14.5 12/02/2022   HCT 41.4 12/02/2022   MCV 99.0 12/02/2022   PLT 259 12/02/2022   Lab Results  Component Value Date   NA 134 (L) 12/02/2022   K 4.3 12/02/2022   CL 99 12/02/2022   CO2 29 12/02/2022   Lab Results  Component Value Date   ALT 19 12/02/2022   AST 17 12/02/2022   ALKPHOS 110 12/02/2022   BILITOT 0.3 12/02/2022      RADIOGRAPHY: CUP PACEART REMOTE DEVICE CHECK  Result Date: 12/19/2022 ILR summary report received. Battery status OK. Normal device function. No new symptom, tachy, or  brady episodes. No new AF episodes. 1 Pause episode recorded on 12/15/22, previously triaged to clinic on 12/15/22 for complete HB followed by [facility]. Monthly summary reports and ROV/PRN. [facility], [facility]. ILR summary report received. Battery status OK. Normal device function. No new symptom, tachy, or  brady episodes. No new AF episodes. 1 Pause episode recorded on 12/15/22, previously triaged to clinic on 12/15/22 for complete HB followed by [facility] 2.   Monthly summary reports and ROV/PRN. [facility], [facility]. ILR summary report received. Battery status OK. Normal device function. No new symptom, tachy, or  brady  episodes. No  new AF episodes.  1 Pause episode recorded on 12/15/22, previously triaged to clinic on 12/15/22 for complete HB followed by [facility] 2.   Monthly summary reports and ROV/PRN. [facility], [facility].  DG Chest Port 1 View  Result Date: 12/17/2022 CLINICAL DATA:  Status post bronchoscopy with biopsy. EXAM: PORTABLE CHEST 1 VIEW COMPARISON:  January 09, 2018. FINDINGS: The heart size and mediastinal contours are within normal limits. No pneumothorax is noted. No acute pulmonary disease is noted. The visualized skeletal structures are unremarkable. IMPRESSION: No active disease. Electronically Signed   By: Lupita Raider M.D.   On: 12/17/2022 12:30   DG C-ARM BRONCHOSCOPY  Result Date: 12/17/2022 C-ARM BRONCHOSCOPY: Fluoroscopy was utilized by the requesting physician.  No radiographic interpretation.       IMPRESSION/PLAN: 1. Stage IA2, cT1bN0M0, NSCLC, adenocarcinoma of the RUL. Dr. Mitzi Hansen discusses the pathology findings and reviews the nature of early stage lung cancer.  Dr. Mitzi Hansen reviews that the standard of care is for surgical resection. However for patients who are not medical candidates to undergo surgery, or who choose to forgo surgery, stereotactic body radiotherapy (SBRT) is an appropriate alternative. We discussed the risks, benefits, short, and long term effects of radiotherapy, as well as the curative intent, and the patient is interested in proceeding. Dr. Mitzi Hansen discusses the delivery and logistics of radiotherapy and anticipates a course of 3-5 fractions of radiotherapy. Written consent is obtained and placed in the chart, a copy was provided to the patient. The patient will be contacted to coordinate treatment planning by our simulation department.    In a visit lasting 60 minutes, greater than 50% of the time was spent face to face discussing the patient's condition, in preparation for the discussion, and coordinating the patient's care.   The above documentation reflects my direct findings  during this shared patient visit. Please see the separate note by Dr. Mitzi Hansen on this date for the remainder of the patient's plan of care.    Osker Mason, [facility]   **Disclaimer: This note was dictated with voice recognition software. Similar sounding words can inadvertently be transcribed and this note may contain transcription errors which may not have been corrected upon publication of note.**

## 2023-01-13 NOTE — Therapy (Signed)
OUTPATIENT PHYSICAL THERAPY TREATMENT  Patient Name: Damon Parrish MRN: [id number] DOB:August 25, 1941, 82 y.o., male Today's Date: 01/13/2023   PCP: Elise Benne, MD REFERRING PROVIDER: Charlton Amor, PA-C  END OF SESSION:  PT End of Session - 01/13/23 0955     Visit Number 8    Number of Visits 16    Date for PT Re-Evaluation 01/21/23    Authorization Type Medicare    Progress Note Due on Visit 10    PT Start Time 0950    PT Stop Time 1030    PT Time Calculation (min) 40 min    Activity Tolerance Patient tolerated treatment well    Behavior During Therapy [facility] for tasks assessed/performed            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   GERD (gastroesophageal reflux disease)    H/O cerebral aneurysm repair 2016   "put stent in"   High cholesterol    History of blood transfusion ~ 1950   "while in hospital w/pneumonia" (01/09/2018)   History of kidney stones    Hypertension    Nonruptured cerebral aneurysm    Pneumonia ~ 1950   Red man syndrome    Seizures 12/27/2015; 12/28/2015   Stroke 12/2015   denies residual on 01/09/2018)   Past Surgical History:  Procedure Laterality Date   BRONCHIAL BIOPSY  12/17/2022   Procedure: BRONCHIAL BIOPSIES;  Parrish: Josephine Igo, DO;  Location: [facility]OS[facility];  Service: Pulmonary;;   BRONCHIAL NEEDLE ASPIRATION BIOPSY  12/17/2022   Procedure: BRONCHIAL NEEDLE ASPIRATION BIOPSIES;  Parrish: Josephine Igo, DO;  Location: [facility] ENDOS[facility];  Service: Pulmonary;;   CATARACT EXTRACTION W/ INTRAOCULAR LENS  IMPLANT, BILATERAL Bilateral    COLONOSCOPY     2013 per patient: done in [place], normal, next colonoscopy due in 10 years   EYE SURGERY Left 06/17/2022   FOOT FRACTURE SURGERY Left    "pins, etc. in there"   FRACTURE SURGERY     LOOP RECORDER  INSERTION N/A 11/15/2022   Procedure: LOOP RECORDER INSERTION;  Parrish: Maurice Small, MD;  Location: [facility] INVASIVE CV LAB;  Service: Cardiovascular;  Laterality: N/A;   RADIOLOGY WITH ANESTHESIA N/A 05/04/2015   Procedure: Pipeline Embolization;  Parrish: Lisbeth Renshaw, MD;  Location: [facility] NEURO ORS;  Service: Radiology;  Laterality: N/A;   RADIOLOGY WITH ANESTHESIA N/A 05/31/2015   Procedure: Pipeline Embolization;  Parrish: Lisbeth Renshaw, MD;  Location: [facility];  Service: Radiology;  Laterality: N/A;   Patient Active Problem List   Diagnosis Date Noted   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   BPH (benign prostatic hyperplasia) 12/31/2022   Tobacco use 12/31/2022   Prediabetes 12/31/2022   Primary open angle glaucoma, both eyes 12/31/2022   Emphysema lung 12/31/2022   Encounter for general adult medical examination with abnormal findings 12/31/2022   Lung nodule 12/05/2022   CVA (cerebral vascular accident) 11/14/2022   Seizure disorder 11/13/2022   Pulmonary nodule 11/13/2022   Cough 11/13/2022   Left leg numbness    Muscle fasciculation    Focal motor seizure 01/07/2019   Allergic reaction    DRESS syndrome    Pressure injury of skin 01/09/2018   Adverse drug reaction 01/08/2018   Dermatitis, drug-induced 01/08/2018   Discitis 12/17/2017  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  Rationale for Evaluation and Treatment: Rehabilitation  SUBJECTIVE:                                                                                                                                                                                             SUBJECTIVE STATEMENT: Patient reports he begins radiation on Wednesday for his lung; decided against the resection surgery.  States he is doing well today without pain.  "I ain't been doing the exercises"  Eval: Comes to the clinic with some unsteadiness when walking. Also c/o weakness on the L LE. Reports that the L LE feels like it's going to give way. Patient had CVA in 11/13/22. Patient was admitted to the hospital for the week (including inpatient rehab). Upon D/C patient was then sent to outpatient PT evaluation and management. Pt accompanied by:  wife  PERTINENT HISTORY: HTN, hx of osteomyelitis of the thoracic spine, hx of L foot fx   PAIN:  Are you having pain? No  PRECAUTIONS: None  WEIGHT BEARING RESTRICTIONS: No  FALLS: Has patient fallen in last 6 months? Yes. Number of falls 1 (10/03/2022), MD already aware  LIVING ENVIRONMENT: Lives with: lives with their spouse Lives in: House/apartment Stairs: Yes: External: 1 steps; none Has following equipment at home: Single point cane, Environmental consultant - 2 wheeled, Environmental consultant - 4 wheeled, and Wheelchair (manual)  PLOF: Independent with community mobility with device [facility])  PATIENT GOALS: "get going and get moving"  OBJECTIVE:   DIAGNOSTIC FINDINGS:  MRI HEAD WITHOUT CONTRAST 11/13/2022   TECHNIQUE: Multiplanar, multiecho pulse sequences of the brain and surrounding structures were obtained without intravenous contrast.   COMPARISON:  12/30/2015   FINDINGS: Brain: Small area of abnormal diffusion restriction within the right corona radiata. Chronic right subdural hematoma. No acute hemorrhage. There is multifocal hyperintense T2-weighted signal within  the white matter. Generalized volume loss. The midline structures are normal.   Vascular: Major flow voids are preserved.   Skull and  upper cervical spine: Normal calvarium and skull base. Visualized upper cervical spine and soft tissues are normal.   Sinuses/Orbits:No paranasal sinus fluid levels or advanced mucosal thickening. No mastoid or middle ear effusion. Normal orbits.   IMPRESSION: 1. Small area of acute or early subacute ischemia within the right corona radiata. 2. Chronic right subdural hematoma.    COGNITION: Overall cognitive status: Within functional limits for tasks assessed   SENSATION: Light touch: WFL  COORDINATION: Intact  MUSCLE TONE: WNL on B LE  MUSCLE LENGTH: Mild tightness on B hamstrings and gastrocsoleus complex  POSTURE: rounded shoulders and forward head  LOWER EXTREMITY ROM:     Active  Right Eval Left Eval  Hip flexion [facility]  Hip extension [facility]  Hip abduction [facility]  Hip adduction    Hip internal rotation    Hip external rotation    Knee flexion [facility] WFL  Knee extension [facility]  Ankle dorsiflexion [facility] WFL  Ankle plantarflexion [facility] WFL  Ankle inversion    Ankle eversion     (Blank rows = not tested)  LOWER EXTREMITY MMT:    MMT Right Eval Left Eval  Hip flexion 4 4+  Hip extension 3+ 3+  Hip abduction 4 4  Hip adduction    Hip internal rotation    Hip external rotation    Knee flexion 4+ 4  Knee extension 4+ 4  Ankle dorsiflexion 4+ 4+  Ankle plantarflexion 4+ 4+  Ankle inversion    Ankle eversion    (Blank rows = not tested)  BED MOBILITY:  Sit to supine Complete [employer] Supine to sit Complete Independence Rolling to Right Complete [employer] Rolling to Left Complete Independence  TRANSFERS: Assistive device utilized: Single point cane  Sit to stand: Modified independence Stand to sit: Modified independence  GAIT:  Distance walked: 311 ft Assistive device utilized: Single point cane Level  of assistance: Modified independence Comments: B LE in ER, wide BOS  FUNCTIONAL TESTS:  5 times sit to stand: 17.83 sec 2 minute walk test: 311 ft with SPC. Tinetti POMA: 16 (balance + gait) done with SPC   TODAY'S TREATMENT:                                                                                                                              DATE:  01/13/23 Standing: Heel/toe raises 20X Hip abduction 2 x 10 GTB Hip extension GTB 2 x 10 Marching alternating GTB 2X10 4" box step ups x 10 each 4" box lateral step ups x 10 each 4" box lunges without UE, CGA from therapist 2X10 each foam small BOS 3 x 30" no UE assist CGA from therapist Seated:  Sit to stand no UE assist 2 x 5 Nu step level 2 seat 9 x 5 min   01/08/23 Nu step level 2 seat 9 x 5 min  Sit to stand no UE assist 2 x 5  Standing: Heel/toe raises  2 x 10 Slant board 3 x 20" Hip abduction 3 x 10; last set with GTB Hip extension GTB 2 x 10 4" box step ups x 10 each 4" box lateral step ups x 10 each Standing on foam small BOS 3 x 30" no UE assist   01/06/23 Nu step lv 2 seat 9 5 min   Standing: Slant board 2 x 30"  Sit to stand x 10 no UE assist from chair Heel raises 20X Toe raises x20  Hip abduction GTB 2 x 10 each Hip extension GTB 2 x 10 each 4" step ups 2 x 10 each   Semi tandem stance 2 x 30" Marching on foam 3 x 30"  Rhomberg on foam 20 sec EO x 3  12/26/22 Sit to stand x 10 no UE assist from chair Standing: Heel raises 20X Hip abduction 2 x 10 each Hip extension 2 x 10 each 4" step ups 2 x 10 leading up with the left 4" lateral step ups 2 x 10  4" lunges with intermittent HHA X10 each with intermittent HHA  Sitting: 3# LAQs 2" hold x 10 3# hip flexion x 10 RTB  hip abduction 2 x 10   12/16/22 Sit to stand x 10 no UE assist from chair  Sitting: 3# LAQs 2" hold x 10 3# hip flexion x 10 RTB  hip abduction 2 x 10   Standing: Heel raises 2 x 10 Hip abduction 2 x 10 each Hip  extension 2 x 10 each 4" step ups 2 x 10 leading up with the left 4" lateral step ups 2 x 10     PATIENT EDUCATION: Education details: Educated on the pathoanatomy of CVA. Educated on the goals and course of rehab. Written HEP provided and reviewed. Education on falls risk reduction at home Person educated: Patient Education method: Explanation, Demonstration, and Handouts Education comprehension: verbalized understanding and returned demonstration  HOME EXERCISE PROGRAM: 12/16/22 hip abduction with theraband  Access Code: [email or web address] URL: [email or web address]  12/09/22   Sit to Stand with Arms Crossed  - 1-2 x daily - 7 x weekly - 2 sets - 10 reps - Heel Raises with Counter Support  - 1-2 x daily - 7 x weekly - 2 sets - 10 reps - Toe Raises with Counter Support  - 1-2 x daily - 7 x weekly - 2 sets - 10 reps - Standing Tandem Balance with Counter Support  - 1-2 x daily - 7 x weekly - 1 sets - 3 reps - 20 second hold - Standing Hip Abduction with Counter Support  - 1-2 x daily - 7 x weekly - 2 sets - 10 reps   Date: 11/26/2022 Prepared by: Krystal Clark  Exercises - Seated Calf Stretch with Strap  - 1-2 x daily - 5-7 x weekly - 3 reps - 30 hold - Mini Squat with Counter Support  - 1-2 x daily - 5-7 x weekly - 2 sets - 10 reps - 3 hold  GOALS: Goals reviewed with patient? Yes  SHORT TERM GOALS: Target date: 12/24/22   Pt will demonstrate indep in HEP to facilitate carry-over of skilled services and improve functional outcomes Goal status: IN PROGRESS  LONG TERM GOALS: Target date: 01/21/23  Pt will decrease 5TSTS by at least 3 seconds in order to demonstrate clinically significant improvement in LE strength Baseline: 17.83 Goal status: IN PROGRESS  2.  Pt will increase by at least 40 ft in order to demonstrate clinically  significant improvement in community ambulation Baseline: 311 ft  Goal status: IN PROGRESS  3.  Patient will demonstrate increase in  Tinetti Score by 4 points in order to demonstrate clinically significant improvement in balance and decreased risk for falls Baseline: 16 Goal status: IN PROGRESS  4.  Pt will demonstrate increase in LE strength to 4+/5 to facilitate ease and safety in ambulation  Baseline: 3+/5 Goal status: IN PROGRESS  ASSESSMENT:  CLINICAL IMPRESSION: Today's session continued to address lower extremity weakness and balance impairments.  Education and encouragement to increase compliance with HEP, especially since he may be missing sessions here due to cancer appts.  Continued  with LE strengthening and stability challenges.  Tandem with Lt leading is more difficult than Rt.  Inability to maintain greater than 5 seconds with either LE without UE assist.  Pt will not return this week due to beginning radiation.   Patient will continue to benefit from skilled therapy services to reduce remaining deficits and improve functional ability.    OBJECTIVE IMPAIRMENTS: Abnormal gait, difficulty walking, decreased ROM, decreased strength, and impaired flexibility.   ACTIVITY LIMITATIONS: carrying, lifting, bending, standing, and transfers  PARTICIPATION LIMITATIONS: meal prep, cleaning, laundry, and community activity  PERSONAL FACTORS: Age and Fitness are also affecting patient's functional outcome.   REHAB POTENTIAL: Good  CLINICAL DECISION MAKING: Stable/uncomplicated  EVALUATION COMPLEXITY: Low  PLAN:  PT FREQUENCY: 2x/week  PT DURATION: 8 weeks  PLANNED INTERVENTIONS: Therapeutic exercises, Therapeutic activity, Neuromuscular re-education, Balance training, Gait training, Patient/Family education, Self Care, Stair training, and Manual therapy  PLAN FOR NEXT SESSION: Progress LE strengthening and stretching as tolerated. Progress balance and gait activities. Update HEP prn. Reassess next session.  9:58 AM, 01/13/23 Lurena Nida, PTA/CLT [facility] Ph: [phone]

## 2023-01-14 NOTE — Progress Notes (Signed)
Thoracic Location of Tumor / Histology: Right Upper Lobe Lung  Patient presented for stroke work up and was found on CTA to have a mass in the upper lobe of the right lung.  PET 12/12/2022: 15 x 8 mm medial right apical lung nodule described on concurrent CT chest, max SUV 5.5. This is suspicious for primary bronchogenic neoplasm.  CT Chest 11/13/2022: There is a slightly spiculated subpleural right upper lobe pulmonary nodule measuring 15 x 10 x 14 mm  MRI Brain 11/13/2022: Small area of acute or early subacute ischemia within the right corona radiata.  Chronic right subdural hematoma.  Biopsies of Right Upper Lobe Lung 12/17/2022   Tobacco/Marijuana/Snuff/ETOH use: Current Smoker   Past/Anticipated interventions by cardiothoracic surgery, if any:  Dr. Cliffton Asters -Not a surgical candidate.  Past/Anticipated interventions by medical oncology, if any:   Signs/Symptoms Weight changes, if any:  Respiratory complaints, if any:  Hemoptysis, if any:  Pain issues, if any:    SAFETY ISSUES: Prior radiation?  Pacemaker/ICD?   Possible current pregnancy? N/a Is the patient on methotrexate?   Current Complaints / other details:

## 2023-01-15 ENCOUNTER — Ambulatory Visit
Admission: RE | Admit: 2023-01-15 | Discharge: 2023-01-15 | Disposition: A | Discharge status: Home / Self Care | Payer: Medicare Other | Source: Ambulatory Visit | Attending: Radiation Oncology | Admitting: Radiation Oncology

## 2023-01-15 ENCOUNTER — Encounter: Payer: Self-pay | Admitting: Cardiovascular Disease

## 2023-01-15 ENCOUNTER — Encounter: Payer: Self-pay | Admitting: Radiation Oncology

## 2023-01-15 ENCOUNTER — Ambulatory Visit (HOSPITAL_COMMUNITY): Payer: Medicare Other | Admitting: Physical Therapy

## 2023-01-15 VITALS — BP 132/80 | HR 65 | Temp 97.0°F | Resp 18 | Ht 69.0 in | Wt 184.2 lb

## 2023-01-15 DIAGNOSIS — F17210 Nicotine dependence, cigarettes, uncomplicated: Secondary | ICD-10-CM | POA: Insufficient documentation

## 2023-01-15 DIAGNOSIS — M129 Arthropathy, unspecified: Secondary | ICD-10-CM | POA: Insufficient documentation

## 2023-01-15 DIAGNOSIS — C3411 Malignant neoplasm of upper lobe, right bronchus or lung: Secondary | ICD-10-CM

## 2023-01-15 DIAGNOSIS — Z87442 Personal history of urinary calculi: Secondary | ICD-10-CM | POA: Insufficient documentation

## 2023-01-15 DIAGNOSIS — K219 Gastro-esophageal reflux disease without esophagitis: Secondary | ICD-10-CM | POA: Insufficient documentation

## 2023-01-15 DIAGNOSIS — Z79899 Other long term (current) drug therapy: Secondary | ICD-10-CM | POA: Insufficient documentation

## 2023-01-15 DIAGNOSIS — I10 Essential (primary) hypertension: Secondary | ICD-10-CM | POA: Diagnosis not present

## 2023-01-15 DIAGNOSIS — Z8673 Personal history of transient ischemic attack (TIA), and cerebral infarction without residual deficits: Secondary | ICD-10-CM | POA: Insufficient documentation

## 2023-01-15 DIAGNOSIS — Z7902 Long term (current) use of antithrombotics/antiplatelets: Secondary | ICD-10-CM | POA: Insufficient documentation

## 2023-01-15 DIAGNOSIS — E7800 Pure hypercholesterolemia, unspecified: Secondary | ICD-10-CM | POA: Insufficient documentation

## 2023-01-15 DIAGNOSIS — S065XAA Traumatic subdural hemorrhage with loss of consciousness status unknown, initial encounter: Secondary | ICD-10-CM | POA: Insufficient documentation

## 2023-01-15 DIAGNOSIS — C3491 Malignant neoplasm of unspecified part of right bronchus or lung: Secondary | ICD-10-CM

## 2023-01-15 NOTE — Progress Notes (Signed)
TO BE COMPLETED BY RADIATION ONCOLOGIST OFFICE:   Patient Name: Damon Parrish   Date of Birth: 10-Jun-1941   Radiation Oncologist:   Site to be Treated:   Will x-rays >10 MV be used? Unknown  Will the radiation be >10 cm from the device? Unknown  Planned Treatment Start Date:   TO BE COMPLETED BY CARDIOLOGIST OFFICE:   Device Information:  Loop Recorder  Brand: Medtronic: [phone] Model #: Medtronic [id number] II  Serial Number: [id number]     Date of Placement: 11/15/2022  Site of Placement: Chest  Remote Device Check--Frequency: Every 33 days   Last Check: 12/19/2022  Does cardiologist request Radiation Oncology to schedule device testing by vendor for the following:  Prior to the Initiation of Treatments?  Yes  No  During Treatments?  Yes  No  Post Radiation Treatments?  Yes  No   Is device monitoring necessary by vendor/cardiologist team during treatments?  Yes   No   Is cardiac monitoring by Radiation Oncology nursing necessary during treatments? Yes   No   Do you recommend device be relocated prior to Radiation Treatment? Yes   No   **PLEASE LIST ANY NOTES OR SPECIAL REQUESTS:       CARDIOLOGIST SIGNATURE:  Dr. York Pellant Per Device Clinic Standing Orders, Lenor Coffin  01/15/2023 4:15 PM  **Please route completed form back to Radiation Oncology Nursing and "P [facility] RAD ONC ADMIN", OR send an update if there will be a delay in having form completed by expected start date.  **Call [phone] if you have any questions or do not get an in-basket response from a Radiation Oncology staff member

## 2023-01-16 ENCOUNTER — Encounter: Payer: Self-pay | Admitting: Internal Medicine

## 2023-01-16 ENCOUNTER — Ambulatory Visit (INDEPENDENT_AMBULATORY_CARE_PROVIDER_SITE_OTHER): Payer: Medicare Other | Admitting: Internal Medicine

## 2023-01-16 VITALS — BP 124/72 | HR 74 | Ht 69.0 in | Wt 179.6 lb

## 2023-01-16 DIAGNOSIS — I10 Essential (primary) hypertension: Secondary | ICD-10-CM

## 2023-01-16 DIAGNOSIS — C3491 Malignant neoplasm of unspecified part of right bronchus or lung: Secondary | ICD-10-CM

## 2023-01-16 DIAGNOSIS — I63511 Cerebral infarction due to unspecified occlusion or stenosis of right middle cerebral artery: Secondary | ICD-10-CM | POA: Diagnosis not present

## 2023-01-16 DIAGNOSIS — J439 Emphysema, unspecified: Secondary | ICD-10-CM

## 2023-01-16 MED ORDER — ATORVASTATIN CALCIUM 80 MG PO TABS: 80.0000 mg | ORAL_TABLET | Freq: Every day | ORAL | 1 refills | Status: DC

## 2023-01-16 NOTE — Progress Notes (Signed)
Established Patient Office Visit  Subjective   Patient ID: Damon Parrish, male    DOB: [birth year]  Age: 82 y.o. MRN: [id number]  Chief Complaint  Patient presents with   Hypertension    Follow up   [name] is returns to care today for HTN follow-up.  He was last evaluated by me on 4/3 as a new patient presenting to establish care.  I recommended that he resume amlodipine 5 mg daily for improved treatment of hypertension.  2-4-week follow-up was arranged for HTN check.  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  Ultimately it was felt that SBRT would be a better treatment option given his chronic medical conditions.  He has since establish care with radiation oncology and will soon begin SBRT.  [name] reports feeling fairly well today.  He is asymptomatic and has no additional concerns to discuss.  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   GERD (gastroesophageal reflux disease)    H/O cerebral aneurysm repair 2016   "put stent in"   High cholesterol    History of blood transfusion ~ 1950   "while in hospital w/pneumonia" (01/09/2018)   History of kidney stones    Hypertension    Nonruptured cerebral aneurysm    Pneumonia ~ 1950   Red man syndrome    Seizures (HCC) 12/27/2015; 12/28/2015   Stroke (HCC) 12/2015   denies residual on 01/09/2018)   Past Surgical History:  Procedure Laterality Date   BRONCHIAL BIOPSY  12/17/2022   Procedure: BRONCHIAL BIOPSIES;  Surgeon: Damon Igo, DO;  [facility];  Service: Pulmonary;;   BRONCHIAL NEEDLE ASPIRATION BIOPSY  12/17/2022   Procedure: BRONCHIAL NEEDLE ASPIRATION BIOPSIES;  Surgeon: Damon Igo, DO;  Location: [facility];  Service: Pulmonary;;   CATARACT  EXTRACTION W/ INTRAOCULAR LENS  IMPLANT, BILATERAL Bilateral    COLONOSCOPY     2013 per patient: done in [place], normal, next colonoscopy due in 10 years   EYE SURGERY Left 06/17/2022   FOOT FRACTURE SURGERY Left    "pins, etc. in there"   FRACTURE SURGERY     LOOP RECORDER INSERTION N/A 11/15/2022   Procedure: LOOP RECORDER INSERTION;  Surgeon: Damon Small, MD;  [facility];  Service: Cardiovascular;  Laterality: N/A;   RADIOLOGY WITH ANESTHESIA N/A 05/04/2015   Procedure: Pipeline Embolization;  Surgeon: Damon Renshaw, MD;  Location: [facility];  Service: Radiology;  Laterality: N/A;   RADIOLOGY WITH ANESTHESIA N/A 05/31/2015   Procedure: Pipeline Embolization;  Surgeon: Damon Renshaw, MD;  Location: [facility] OR;  Service: Radiology;  Laterality: N/A;   Social History   Tobacco Use   Smoking status: Every Day    Packs/day: 1.00    Years: 61.00    Additional pack years: 0.00    Total pack years: 61.00    Types: Cigarettes   Smokeless tobacco: Never   Tobacco comments:    Smokes 4 packs of cigarettes in a week. 12/05/22 [place]    1/2 ppd per pt 01/10/23  Vaping Use   Vaping Use: Never used  Substance Use Topics   Alcohol use: No    Alcohol/week: 0.0 standard drinks of alcohol  Drug use: No   Family History  Problem Relation Age of Onset   Lymphoma Mother    Dementia Mother    Aneurysm Father    Colon cancer Neg Hx    Allergies  Allergen Reactions   Cephalosporins Rash    Full body rash with systemic symptoms   Vancomycin Rash    Full body rash with systemic symptoms   Review of Systems  Constitutional:  Negative for chills and fever.  HENT:  Negative for sore throat.   Respiratory:  Negative for cough and shortness of breath.   Cardiovascular:  Negative for chest pain, palpitations and leg swelling.  Gastrointestinal:  Negative for abdominal pain, blood in stool, constipation, diarrhea, nausea and vomiting.  Genitourinary:  Negative for  dysuria and hematuria.  Musculoskeletal:  Negative for myalgias.  Skin:  Negative for itching and rash.  Neurological:  Negative for dizziness and headaches.  Psychiatric/Behavioral:  Negative for depression and suicidal ideas.      Objective:     BP 124/72   Pulse 74   Ht 5\' 9"  (1.753 m)   Wt 179 lb 9.6 oz (81.5 kg)   SpO2 98%   BMI 26.52 kg/m  BP Readings from Last 3 Encounters:  01/16/23 124/72  01/15/23 132/80  01/10/23 127/76   Physical Exam Vitals reviewed.  Constitutional:      General: He is not in acute distress.    Appearance: Normal appearance. He is not ill-appearing.  HENT:     Head: Normocephalic and atraumatic.     Right Ear: External ear normal.     Left Ear: External ear normal.     Nose: Nose normal. No congestion or rhinorrhea.     Mouth/Throat:     Mouth: Mucous membranes are moist.     Pharynx: Oropharynx is clear.  Eyes:     General: No scleral icterus.    Extraocular Movements: Extraocular movements intact.     Conjunctiva/sclera: Conjunctivae normal.     Pupils: Pupils are equal, round, and reactive to light.  Cardiovascular:     Rate and Rhythm: Normal rate and regular rhythm.     Pulses: Normal pulses.     Heart sounds: Normal heart sounds. No murmur heard. Pulmonary:     Effort: Pulmonary effort is normal.     Breath sounds: Normal breath sounds. No wheezing, rhonchi or rales.  Abdominal:     General: Abdomen is flat. Bowel sounds are normal. There is no distension.     Palpations: Abdomen is soft.     Tenderness: There is no abdominal tenderness.  Musculoskeletal:        General: No swelling or deformity. Normal range of motion.     Cervical back: Normal range of motion.  Skin:    General: Skin is warm and dry.     Capillary Refill: Capillary refill takes less than 2 seconds.  Neurological:     General: No focal deficit present.     Mental Status: He is alert and oriented to person, place, and time.     Motor: No weakness.      Comments: 4/5 strength in LUE compared to RUE.  Psychiatric:        Mood and Affect: Mood normal.        Behavior: Behavior normal.        Thought Content: Thought content normal.   Last CBC Lab Results  Component Value Date   WBC 7.3 12/02/2022   HGB 14.5 12/02/2022  HCT 41.4 12/02/2022   MCV 99.0 12/02/2022   MCH 34.7 (H) 12/02/2022   RDW 12.7 12/02/2022   PLT 259 12/02/2022   Last metabolic panel Lab Results  Component Value Date   GLUCOSE 127 (H) 12/02/2022   NA 134 (L) 12/02/2022   K 4.3 12/02/2022   CL 99 12/02/2022   CO2 29 12/02/2022   BUN 14 12/02/2022   CREATININE 1.03 12/02/2022   GFRNONAA >60 12/02/2022   CALCIUM 9.4 12/02/2022   PROT 6.6 12/02/2022   ALBUMIN 3.9 12/02/2022   BILITOT 0.3 12/02/2022   ALKPHOS 110 12/02/2022   AST 17 12/02/2022   ALT 19 12/02/2022   ANIONGAP 6 12/02/2022   Last lipids Lab Results  Component Value Date   CHOL 146 11/14/2022   HDL 46 11/14/2022   LDLCALC 81 11/14/2022   TRIG 96 11/14/2022   CHOLHDL 3.2 11/14/2022   Last hemoglobin A1c Lab Results  Component Value Date   HGBA1C 6.1 (H) 11/13/2022   Last thyroid functions Lab Results  Component Value Date   TSH 1.263 12/16/2017     Assessment & Plan:   Problem List Items Addressed This Visit       HTN (hypertension)    Presenting today for HTN follow-up.  Amlodipine 5 mg daily was resumed at his last appointment.  BP today has improved to 124/72. 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  He has been evaluated by CT surgery for consideration of resection of RUL nodule.  Ultimately SBRT has been deemed a better treatment option given his chronic medical conditions.  He has establish care with radiation oncology and will start SBRT next month.      Emphysema lung (HCC)    Emphysematous changes noted on prior imaging.  PFTs recently performed are consistent with fixed  obstructive process.  He is asymptomatic currently. -Pulmonology referral placed today in light of recent PFT results.       Return in about 3 months (around 04/17/2023).    Billie Lade, MD

## 2023-01-16 NOTE — Patient Instructions (Signed)
It was a pleasure to see you today.  Thank you for giving [place] the opportunity to be involved in your care.  Below is a brief recap of your visit and next steps.  We will plan to see you again in 3 months.  Summary No medication changes today. Blood pressure has improved. I have refilled atorvastatin and placed a referral to pulmonology. Follow up in 3 months.

## 2023-01-21 ENCOUNTER — Ambulatory Visit (HOSPITAL_COMMUNITY): Payer: Medicare Other | Admitting: Physical Therapy

## 2023-01-21 ENCOUNTER — Ambulatory Visit (INDEPENDENT_AMBULATORY_CARE_PROVIDER_SITE_OTHER): Payer: Medicare Other

## 2023-01-21 DIAGNOSIS — I639 Cerebral infarction, unspecified: Secondary | ICD-10-CM | POA: Diagnosis not present

## 2023-01-21 DIAGNOSIS — R262 Difficulty in walking, not elsewhere classified: Secondary | ICD-10-CM

## 2023-01-21 DIAGNOSIS — M6281 Muscle weakness (generalized): Secondary | ICD-10-CM

## 2023-01-21 NOTE — Therapy (Signed)
OUTPATIENT PHYSICAL THERAPY TREATMENT  Patient Name: Damon Parrish MRN: [id number] DOB:10/25/1940, 82 y.o., male Today's Date: 01/21/2023 PHYSICAL THERAPY DISCHARGE SUMMARY  Visits from Start of Care: 9  Current functional level related to goals / functional outcomes: See below   Remaining deficits: See below    Education / Equipment: See assessment    Patient agrees to discharge. Patient goals were met. Patient is being discharged due to meeting the stated rehab goals.   PCP: Elise Benne, MD REFERRING PROVIDER: Charlton Amor, PA-C  END OF SESSION:  PT End of Session - 01/21/23 0941     Visit Number 9    Number of Visits 16    Date for PT Re-Evaluation 01/21/23    Authorization Type Medicare    Progress Note Due on Visit 10    PT Start Time 0946    PT Stop Time 1016    PT Time Calculation (min) 30 min    Activity Tolerance Patient tolerated treatment well    Behavior During Therapy [facility] for tasks assessed/performed            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   GERD (gastroesophageal reflux disease)    H/O cerebral aneurysm repair 2016   "put stent in"   High cholesterol    History of blood transfusion ~ 1950   "while in hospital w/pneumonia" (01/09/2018)   History of kidney stones    Hypertension    Nonruptured cerebral aneurysm    Pneumonia ~ 1950   Red man syndrome    Seizures (HCC) 12/27/2015; 12/28/2015   Stroke (HCC) 12/2015   denies residual on 01/09/2018)   Past Surgical History:  Procedure Laterality Date   BRONCHIAL BIOPSY  12/17/2022   Procedure: BRONCHIAL BIOPSIES;  Surgeon: Josephine Igo, DO;  [facility];  Service: Pulmonary;;   BRONCHIAL NEEDLE ASPIRATION BIOPSY  12/17/2022   Procedure: BRONCHIAL NEEDLE ASPIRATION BIOPSIES;  Surgeon: Josephine Igo, DO;  [facility]: [facility];  Service: Pulmonary;;   CATARACT EXTRACTION W/ INTRAOCULAR LENS  IMPLANT, BILATERAL Bilateral    COLONOSCOPY     2013 per patient: done in [place], normal, next colonoscopy due in 10 years   EYE SURGERY Left 06/17/2022   FOOT FRACTURE SURGERY Left    "pins, etc. in there"   FRACTURE SURGERY     LOOP RECORDER INSERTION N/A 11/15/2022   Procedure: LOOP RECORDER INSERTION;  Surgeon: Maurice Small, MD;  [facility];  Service: Cardiovascular;  Laterality: N/A;   RADIOLOGY WITH ANESTHESIA N/A 05/04/2015   Procedure: Pipeline Embolization;  Surgeon: Lisbeth Renshaw, MD;  Location[facility];  Service: Radiology;  Laterality: N/A;   RADIOLOGY WITH ANESTHESIA N/A 05/31/2015   Procedure: Pipeline Embolization;  Surgeon: Lisbeth Renshaw, MD;  Location: [facility] OR;  Service: Radiology;  Laterality: N/A;   Patient Active Problem List   Diagnosis Date Noted   NSCLC of right lung (HCC) 12/31/2022   BPH (benign prostatic hyperplasia) 12/31/2022   Tobacco use 12/31/2022   Prediabetes 12/31/2022   Primary open angle glaucoma, both eyes 12/31/2022   Emphysema lung (HCC) 12/31/2022   Encounter for general adult medical examination with abnormal findings 12/31/2022   Lung nodule 12/05/2022   CVA (cerebral vascular accident) (HCC) 11/14/2022  Seizure disorder (HCC) 11/13/2022   Pulmonary nodule 11/13/2022   Cough 11/13/2022   Left leg numbness    Muscle fasciculation    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   Allergic reaction    DRESS syndrome    Pressure injury of skin 01/09/2018   Adverse drug reaction 01/08/2018   Dermatitis, drug-induced 01/08/2018   Discitis 12/17/2017   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   Rectal bleeding    Constipation 12/16/2017   Cerebrovascular accident (CVA) due to embolism of right middle cerebral artery (HCC) 03/06/2016   S/P cerebral aneurysm repair  03/06/2016   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   Palpitations 01/05/2016   Aneurysm, cerebral, nonruptured    Hyperlipidemia    Acute CVA (cerebrovascular accident) (HCC) 12/30/2015   Acute right MCA stroke (HCC)    Focal seizure (HCC) 12/28/2015   HTN (hypertension) 12/28/2015   Hyponatremia 12/28/2015   Cerebral aneurysm 05/04/2015    ONSET DATE: 11/13/2022  REFERRING DIAG: ACUTE RIGHT MCA STROKE  THERAPY DIAG:  Difficulty walking  Muscle weakness (generalized)  Rationale for Evaluation and Treatment: Rehabilitation  SUBJECTIVE:                                                                                                                                                                                             SUBJECTIVE STATEMENT: Patein says he feels a little better. No recent falls. Feels he is ready to be done with therapy.   Eval: Comes to the clinic with some unsteadiness when walking. Also c/o weakness on the L LE. Reports that the L LE feels like it's going to give way. Patient had CVA in 11/13/22. Patient was admitted to the hospital for the week (including inpatient rehab). Upon D/C patient was then sent to outpatient PT evaluation and management. Pt accompanied by:  self   PERTINENT HISTORY: HTN, hx of osteomyelitis of the thoracic spine, hx of L foot fx   PAIN:  Are you having pain? No  PRECAUTIONS: None  WEIGHT BEARING RESTRICTIONS: No  FALLS: Has patient fallen in last 6 months? Yes. Number of falls 1 (10/03/2022), MD already aware  LIVING ENVIRONMENT: Lives with: lives with their spouse Lives in: House/apartment Stairs: Yes: External: 1 steps; none Has following equipment at home: Single point cane, Walker - 2 wheeled, Environmental consultant - 4 wheeled, and Wheelchair (manual)  PLOF: Independent with community mobility with device [facility])  PATIENT GOALS: "get going and get moving"  OBJECTIVE:   DIAGNOSTIC FINDINGS:  MRI HEAD WITHOUT CONTRAST 11/13/2022    TECHNIQUE: Multiplanar, multiecho pulse sequences of the brain and surrounding  structures were obtained without intravenous contrast.   COMPARISON:  12/30/2015   FINDINGS: Brain: Small area of abnormal diffusion restriction within the right corona radiata. Chronic right subdural hematoma. No acute hemorrhage. There is multifocal hyperintense T2-weighted signal within the white matter. Generalized volume loss. The midline structures are normal.   Vascular: Major flow voids are preserved.   Skull and upper cervical spine: Normal calvarium and skull base. Visualized upper cervical spine and soft tissues are normal.   Sinuses/Orbits:No paranasal sinus fluid levels or advanced mucosal thickening. No mastoid or middle ear effusion. Normal orbits.   IMPRESSION: 1. Small area of acute or early subacute ischemia within the right corona radiata. 2. Chronic right subdural hematoma.    COGNITION: Overall cognitive status: Within functional limits for tasks assessed   SENSATION: Light touch: WFL  COORDINATION: Intact  MUSCLE TONE: WNL on B LE  MUSCLE LENGTH: Mild tightness on B hamstrings and gastrocsoleus complex  POSTURE: rounded shoulders and forward head  LOWER EXTREMITY ROM:     Active  Right Eval Left Eval  Hip flexion [facility]  Hip extension [facility]  Hip abduction [facility]  Hip adduction    Hip internal rotation    Hip external rotation    Knee flexion [facility] WFL  Knee extension [facility]  Ankle dorsiflexion [facility] WFL  Ankle plantarflexion [facility] WFL  Ankle inversion    Ankle eversion     (Blank rows = not tested)  LOWER EXTREMITY MMT:  (hip extension tested in available ROM due to limited hip extension AROM)   MMT Right Eval Left Eval Right 01/21/23 Left 01/21/23  Hip flexion 4 4+ 4+ 4+  Hip extension 3+ 3+ 4+ 4+  Hip abduction 4 4 4+ 4+  Hip adduction      Hip internal rotation      Hip external rotation      Knee flexion 4+ 4 5 5   Knee extension 4+ 4 5 5    Ankle dorsiflexion 4+ 4+ 5 5  Ankle plantarflexion 4+ 4+    Ankle inversion      Ankle eversion      (Blank rows = not tested)  BED MOBILITY:  Sit to supine Complete Independence Supine to sit Complete Independence Rolling to Right Complete Independence Rolling to Left Complete Independence  TRANSFERS: Assistive device utilized: Single point cane  Sit to stand: Modified independence Stand to sit: Modified independence  GAIT:  Distance walked: 355 feet using SPC (was 311 ft) Assistive device utilized: Single point cane Level of assistance: Modified independence Comments: B LE in ER, wide BOS  FUNCTIONAL TESTS:  5 times sit to stand: 13.9 sec with no UE (was 17.83 sec) 2 minute walk test: 355 ft using SPC (was 311 ft with SPC) Tinetti POMA: 23/28 (was 16 (balance + gait) done with SPC)   TODAY'S TREATMENT:  DATE:  01/21/23 Reassess Tinetti 2 MWT 5x STS MMT   01/13/23 Standing: Heel/toe raises 20X Hip abduction 2 x 10 GTB Hip extension GTB 2 x 10 Marching alternating GTB 2X10 4" box step ups x 10 each 4" box lateral step ups x 10 each 4" box lunges without UE, CGA from therapist 2X10 each foam small BOS 3 x 30" no UE assist CGA from therapist Seated:  Sit to stand no UE assist 2 x 5 Nu step level 2 seat 9 x 5 min   01/08/23 Nu step level 2 seat 9 x 5 min  Sit to stand no UE assist 2 x 5  Standing: Heel/toe raises 2 x 10 Slant board 3 x 20" Hip abduction 3 x 10; last set with GTB Hip extension GTB 2 x 10 4" box step ups x 10 each 4" box lateral step ups x 10 each Standing on foam small BOS 3 x 30" no UE assist   PATIENT EDUCATION: Education details: Educated on the pathoanatomy of CVA. Educated on the goals and course of rehab. Written HEP provided and reviewed. Education on falls risk reduction at home Person educated:  Patient Education method: Explanation, Demonstration, and Handouts Education comprehension: verbalized understanding and returned demonstration  HOME EXERCISE PROGRAM: 12/16/22 hip abduction with theraband  Access Code: [email or web address] URL: [email or web address]  12/09/22   Sit to Stand with Arms Crossed  - 1-2 x daily - 7 x weekly - 2 sets - 10 reps - Heel Raises with Counter Support  - 1-2 x daily - 7 x weekly - 2 sets - 10 reps - Toe Raises with Counter Support  - 1-2 x daily - 7 x weekly - 2 sets - 10 reps - Standing Tandem Balance with Counter Support  - 1-2 x daily - 7 x weekly - 1 sets - 3 reps - 20 second hold - Standing Hip Abduction with Counter Support  - 1-2 x daily - 7 x weekly - 2 sets - 10 reps   Date: 11/26/2022 Prepared by: Krystal Clark  Exercises - Seated Calf Stretch with Strap  - 1-2 x daily - 5-7 x weekly - 3 reps - 30 hold - Mini Squat with Counter Support  - 1-2 x daily - 5-7 x weekly - 2 sets - 10 reps - 3 hold  GOALS: Goals reviewed with patient? Yes  SHORT TERM GOALS: Target date: 12/24/22  Pt will demonstrate indep in HEP to facilitate carry-over of skilled services and improve functional outcomes Goal status: Partially met (reports variable compliance, but no issues when doing HEP)  LONG TERM GOALS: Target date: 01/21/23  Pt will decrease 5TSTS by at least 3 seconds in order to demonstrate clinically significant improvement in LE strength Baseline: 13.9 sec with no UE Goal status: MET  2.  Pt will increase by at least 40 ft in order to demonstrate clinically significant improvement in community ambulation Baseline: 355 ft using SPC Goal status: MET  3.  Patient will demonstrate increase in Tinetti Score by 4 points in order to demonstrate clinically significant improvement in balance and decreased risk for falls Baseline: 23/28 Goal status: MET  4.  Pt will demonstrate increase in LE strength to 4+/5 to facilitate ease and safety  in ambulation  Baseline: See MMT Goal status: MET  ASSESSMENT:  CLINICAL IMPRESSION: Performed reassess today. Patient has met all functional goals and will be DC due to all goals met. Reviewed comprehensive HEP and answered all patient questions. Encouraged  patient to follow up with therapy services with any further questions or concerns.   OBJECTIVE IMPAIRMENTS: Abnormal gait, difficulty walking, decreased ROM, decreased strength, and impaired flexibility.   ACTIVITY LIMITATIONS: carrying, lifting, bending, standing, and transfers  PARTICIPATION LIMITATIONS: meal prep, cleaning, laundry, and community activity  PERSONAL FACTORS: Age and Fitness are also affecting patient's functional outcome.   REHAB POTENTIAL: Good  CLINICAL DECISION MAKING: Stable/uncomplicated  EVALUATION COMPLEXITY: Low  PLAN:  PT FREQUENCY: 2x/week  PT DURATION: 8 weeks  PLANNED INTERVENTIONS: Therapeutic exercises, Therapeutic activity, Neuromuscular re-education, Balance training, Gait training, Patient/Family education, Self Care, Stair training, and Manual therapy  PLAN FOR NEXT SESSION: DC to HEP   10:16 AM, 01/21/23 Georges Lynch PT DPT  Physical Therapist with [facility]  [phone]

## 2023-01-22 LAB — CUP PACEART REMOTE DEVICE CHECK
Date Time Interrogation Session: 20240429230940
Implantable Pulse Generator Implant Date: 20240223

## 2023-01-22 NOTE — Assessment & Plan Note (Signed)
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  He has been evaluated by CT surgery for consideration of resection of RUL nodule.  Ultimately SBRT has been deemed a better treatment option given his chronic medical conditions.  He has establish care with radiation oncology and will start SBRT next month.

## 2023-01-22 NOTE — Assessment & Plan Note (Addendum)
Presenting today for HTN follow-up.  Amlodipine 5 mg daily was resumed at his last appointment.  BP today has improved to 124/72. -No additional medication changes today

## 2023-01-22 NOTE — Assessment & Plan Note (Signed)
Emphysematous changes noted on prior imaging.  PFTs recently performed are consistent with obstructive process.  He is asymptomatic currently. -Pulmonology referral placed today in light of recent PFT results.

## 2023-01-23 NOTE — Progress Notes (Signed)
Carelink Summary Report / Loop Recorder 

## 2023-01-27 ENCOUNTER — Other Ambulatory Visit: Payer: Self-pay

## 2023-01-27 ENCOUNTER — Ambulatory Visit
Admission: RE | Admit: 2023-01-27 | Discharge: 2023-01-27 | Disposition: A | Discharge status: Home / Self Care | Payer: Medicare Other | Source: Ambulatory Visit | Attending: Radiation Oncology | Admitting: Radiation Oncology

## 2023-01-27 DIAGNOSIS — C3411 Malignant neoplasm of upper lobe, right bronchus or lung: Secondary | ICD-10-CM | POA: Insufficient documentation

## 2023-01-27 DIAGNOSIS — Z510 Encounter for antineoplastic radiation therapy: Secondary | ICD-10-CM | POA: Diagnosis not present

## 2023-01-27 DIAGNOSIS — F17210 Nicotine dependence, cigarettes, uncomplicated: Secondary | ICD-10-CM | POA: Insufficient documentation

## 2023-02-11 ENCOUNTER — Ambulatory Visit: Payer: Medicare Other | Admitting: Radiation Oncology

## 2023-02-11 DIAGNOSIS — Z510 Encounter for antineoplastic radiation therapy: Secondary | ICD-10-CM | POA: Diagnosis not present

## 2023-02-12 ENCOUNTER — Other Ambulatory Visit: Payer: Self-pay

## 2023-02-12 ENCOUNTER — Ambulatory Visit
Admission: RE | Admit: 2023-02-12 | Discharge: 2023-02-12 | Disposition: A | Discharge status: Home / Self Care | Payer: Medicare Other | Source: Ambulatory Visit | Attending: Radiation Oncology | Admitting: Radiation Oncology

## 2023-02-12 DIAGNOSIS — Z510 Encounter for antineoplastic radiation therapy: Secondary | ICD-10-CM | POA: Diagnosis not present

## 2023-02-12 LAB — RAD ONC ARIA SESSION SUMMARY
Course Elapsed Days: 0
Plan Fractions Treated to Date: 1
Plan Prescribed Dose Per Fraction: 18 Gy
Plan Total Fractions Prescribed: 3
Plan Total Prescribed Dose: 54 Gy
Reference Point Dosage Given to Date: 18 Gy
Reference Point Session Dosage Given: 18 Gy
Session Number: 1

## 2023-02-12 NOTE — Progress Notes (Signed)
Carelink Summary Report / Loop Recorder 

## 2023-02-14 ENCOUNTER — Other Ambulatory Visit: Payer: Self-pay

## 2023-02-14 ENCOUNTER — Ambulatory Visit
Admission: RE | Admit: 2023-02-14 | Discharge: 2023-02-14 | Disposition: A | Discharge status: Home / Self Care | Payer: Medicare Other | Source: Ambulatory Visit | Attending: Radiation Oncology | Admitting: Radiation Oncology

## 2023-02-14 DIAGNOSIS — Z510 Encounter for antineoplastic radiation therapy: Secondary | ICD-10-CM | POA: Diagnosis not present

## 2023-02-14 LAB — RAD ONC ARIA SESSION SUMMARY
Course Elapsed Days: 2
Plan Fractions Treated to Date: 2
Plan Prescribed Dose Per Fraction: 18 Gy
Plan Total Fractions Prescribed: 3
Plan Total Prescribed Dose: 54 Gy
Reference Point Dosage Given to Date: 36 Gy
Reference Point Session Dosage Given: 18 Gy
Session Number: 2

## 2023-02-18 ENCOUNTER — Ambulatory Visit: Payer: Medicare Other

## 2023-02-18 ENCOUNTER — Ambulatory Visit
Admission: RE | Admit: 2023-02-18 | Discharge: 2023-02-18 | Disposition: A | Discharge status: Home / Self Care | Payer: Medicare Other | Source: Ambulatory Visit | Attending: Radiation Oncology | Admitting: Radiation Oncology

## 2023-02-18 ENCOUNTER — Other Ambulatory Visit: Payer: Self-pay

## 2023-02-18 ENCOUNTER — Ambulatory Visit: Admission: RE | Admit: 2023-02-18 | Payer: Medicare Other | Source: Ambulatory Visit

## 2023-02-18 DIAGNOSIS — Z510 Encounter for antineoplastic radiation therapy: Secondary | ICD-10-CM | POA: Diagnosis not present

## 2023-02-18 DIAGNOSIS — C3411 Malignant neoplasm of upper lobe, right bronchus or lung: Secondary | ICD-10-CM

## 2023-02-18 LAB — RAD ONC ARIA SESSION SUMMARY
Course Elapsed Days: 6
Plan Fractions Treated to Date: 3
Plan Prescribed Dose Per Fraction: 18 Gy
Plan Total Fractions Prescribed: 3
Plan Total Prescribed Dose: 54 Gy
Reference Point Dosage Given to Date: 54 Gy
Reference Point Session Dosage Given: 18 Gy
Session Number: 3

## 2023-02-18 NOTE — Progress Notes (Signed)
  Radiation Oncology         [phone] ________________________________  Name: Damon Parrish MRN: [id number]  Date: 02/18/2023  DOB: Oct 21, 1940  End of Treatment Note  Diagnosis:   Non-small cell lung cancer     Indication for treatment:  Curative       Radiation treatment dates:   02/12/23-02/18/23  Site/dose:   The tumor in the RUL was treated with a course of stereotactic body radiation treatment. The patient received 54 Gy In 3 fractions at 18 G per fraction.  Narrative: The patient tolerated radiation treatment relatively well.   The patient did not have any signs of acute toxicity during treatment.  Plan: The patient has completed radiation treatment. The patient will return to radiation oncology clinic for routine followup in one month. I advised the patient to call or return sooner if they have any questions or concerns related to their recovery or treatment.      Osker Mason, PAC

## 2023-02-24 ENCOUNTER — Ambulatory Visit (INDEPENDENT_AMBULATORY_CARE_PROVIDER_SITE_OTHER): Payer: Medicare Other

## 2023-02-24 DIAGNOSIS — I639 Cerebral infarction, unspecified: Secondary | ICD-10-CM | POA: Diagnosis not present

## 2023-02-24 LAB — CUP PACEART REMOTE DEVICE CHECK
Date Time Interrogation Session: 20240602231233
Implantable Pulse Generator Implant Date: 20240223

## 2023-02-25 NOTE — Radiation Completion Notes (Signed)
Patient Name: Damon Parrish, Damon Parrish MRN: [id number] Date of Birth: [birth year] Referring Physician: Brynda Greathouse, M.D. Date of Service: 2023-02-25 Radiation Oncologist: Dorothy Puffer, M.D. [facility]                             RADIATION ONCOLOGY END OF TREATMENT NOTE     Diagnosis: C34.11 Malignant neoplasm of upper lobe, right bronchus or lung Intent: Curative     ==========DELIVERED PLANS==========  First Treatment Date: 2023-02-12 - Last Treatment Date: 2023-02-18   Plan Name: Lung_RUL_SBRT Site: Lung, Right Technique: SBRT/SRT-IMRT Mode: Photon Dose Per Fraction: 18 Gy Prescribed Dose (Delivered / Prescribed): 54 Gy / 54 Gy Prescribed Fxs (Delivered / Prescribed): 3 / 3     ==========ON TREATMENT VISIT DATES========== 2023-02-12, 2023-02-14, 2023-02-18     ==========UPCOMING VISITS==========       ==========APPENDIX - ON TREATMENT VISIT NOTES==========   See weekly On Treatment Notes in Epic for details.

## 2023-03-06 ENCOUNTER — Telehealth: Payer: Self-pay

## 2023-03-06 NOTE — Telephone Encounter (Signed)
Dr. Mearl Latin called asking if it is ok to hold Plavix for a week for his surgery she is asking for you to call her back at [phone]

## 2023-03-07 NOTE — Telephone Encounter (Signed)
Spoke to Dr. Mearl Latin re: Plavix and eye surgery. Stroke was a while back, risk is lower however not zero, as long as patient is aware of risk of stroke off Plavix, no contraindication for surgery. From seizure standpoint, advised to continue seizure medication even if NPO for procedure.

## 2023-03-10 ENCOUNTER — Ambulatory Visit (INDEPENDENT_AMBULATORY_CARE_PROVIDER_SITE_OTHER): Payer: Medicare Other | Admitting: Internal Medicine

## 2023-03-10 ENCOUNTER — Encounter: Payer: Self-pay | Admitting: Internal Medicine

## 2023-03-10 VITALS — BP 123/77 | HR 74 | Ht 69.0 in | Wt 181.4 lb

## 2023-03-10 DIAGNOSIS — F17210 Nicotine dependence, cigarettes, uncomplicated: Secondary | ICD-10-CM

## 2023-03-10 DIAGNOSIS — J449 Chronic obstructive pulmonary disease, unspecified: Secondary | ICD-10-CM

## 2023-03-10 NOTE — Patient Instructions (Addendum)
If you start having more trouble walking to [facility] and back, please make appointment to start you on a different inhaler from the one you used previously   The key is to stop smoking completely before smoking completely stops you!    Please schedule a follow up visit in 3-4  months but call sooner if needed

## 2023-03-10 NOTE — Progress Notes (Addendum)
Damon Parrish, male    DOB: Mar 01, 1941    MRN: [id number]   Brief patient profile:  65  yowm active smoker  referred to pulmonary clinic in [place]  03/10/2023 by Dr Christel Mormon  for doe s/p SBRT for NSCLC R Lung  1A complete Feb 18 2023 with GOLD 2 criteria documented 01/03/23.     History of Present Illness  03/10/2023  Pulmonary/ 1st office eval/ Damon Parrish / [name] [facility]  Chief Complaint  Patient presents with   Consult  Dyspnea:  mb and back is ok slow pace but does not stop no better on trelegy x 150 ft not flat  Cough: none  Sleep: bed is flat/ one pillow SABA use: none  02: none  No obvious day to day or daytime pattern/variability or assoc excess/ purulent sputum or mucus plugs or hemoptysis or cp or chest tightness, subjective wheeze or overt sinus or hb symptoms.   Sleeping  without nocturnal  or early am exacerbation  of respiratory  c/o's or need for noct saba. Also denies any obvious fluctuation of symptoms with weather or environmental changes or other aggravating or alleviating factors except as outlined above   No unusual exposure hx or h/o childhood pna/ asthma or knowledge of premature birth.  Current Allergies, Complete Past Medical History, Past Surgical History, Family History, and Social History were reviewed in [employer] record.  ROS  The following are not active complaints unless bolded Hoarseness, sore throat, dysphagia, dental problems, itching, sneezing,  nasal congestion or discharge of excess mucus or purulent secretions, ear ache,   fever, chills, sweats, unintended wt loss or wt gain, classically pleuritic or exertional cp,  orthopnea pnd or arm/hand swelling  or leg swelling, presyncope, palpitations, abdominal pain, anorexia, nausea, vomiting, diarrhea  or change in bowel habits or change in bladder habits, change in stools or change in urine, dysuria, hematuria,  rash, arthralgias, visual complaints, headache, numbness,  weakness or ataxia or problems with walking or coordination,  change in mood or  memory.              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   GERD (gastroesophageal reflux disease)    H/O cerebral aneurysm repair 2016   "put stent in"   High cholesterol    History of blood transfusion ~ 1950   "while in hospital w/pneumonia" (01/09/2018)   History of kidney stones    Hypertension    Nonruptured cerebral aneurysm    Pneumonia ~ 1950   Red man syndrome    Seizures (HCC) 12/27/2015; 12/28/2015   Stroke (HCC) 12/2015   denies residual on 01/09/2018)    Outpatient Medications Prior to Visit  Medication Sig Dispense Refill   acetaminophen (TYLENOL) 325 MG tablet Take 1-2 tablets (325-650 mg total) by mouth every 4 (four) hours as needed for mild pain.     albuterol (PROVENTIL) (2.5 MG/3ML) 0.083% nebulizer solution Take 2.5 mg by nebulization every 6 (six) hours as needed for wheezing or shortness of breath.     amLODipine (NORVASC) 5 MG tablet Take 1 tablet (5 mg total) by mouth daily. 30 tablet 2   Ascorbic Acid (VITAMIN C) 1000 MG tablet Take 1,000 mg by mouth daily.     atorvastatin (LIPITOR) 80 MG  tablet Take 1 tablet (80 mg total) by mouth at bedtime. 90 tablet 1   bimatoprost (LUMIGAN) 0.03 % ophthalmic solution Place 1 drop into the right eye at bedtime.     Brinzolamide-Brimonidine (SIMBRINZA) 1-0.2 % SUSP Place 1 drop into the right eye 2 (two) times daily.     calcium carbonate (OSCAL) 1500 (600 Ca) MG TABS tablet Take 600 mg of elemental calcium by mouth daily with breakfast.     Cholecalciferol 125 MCG (5000 UT) TABS Take 5,000 Units by mouth daily.     clopidogrel (PLAVIX) 75 MG tablet Take 1 tablet (75 mg total) by mouth daily. 90 tablet 3   finasteride (PROSCAR) 5 MG tablet Take 1 tablet (5 mg total)  by mouth daily. 30 tablet 0   fluticasone (FLONASE) 50 MCG/ACT nasal spray Place 2 sprays into both nostrils daily.     levETIRAcetam (KEPPRA) 500 MG tablet Take 1 tablet (500 mg total) by mouth 2 (two) times daily. 180 tablet 3   Multiple Vitamin (MULTIVITAMIN) capsule Take 1 capsule by mouth daily. 50+     multivitamin-lutein (OCUVITE-LUTEIN) CAPS capsule Take 1 capsule by mouth daily.     prednisoLONE acetate (PRED FORTE) 1 % ophthalmic suspension Place 1 drop into the left eye in the morning.     senna-docusate (SENOKOT-S) 8.6-50 MG tablet Take 2 tablets by mouth at bedtime as needed for mild constipation. (Patient taking differently: Take 1 tablet by mouth daily.)     tamsulosin (FLOMAX) 0.4 MG CAPS capsule Take 1 capsule (0.4 mg total) by mouth daily after supper.     timolol (TIMOPTIC) 0.25 % ophthalmic solution Place 1 drop into the right eye 2 (two) times daily.     TRELEGY ELLIPTA 100-62.5-25 MCG/ACT AEPB Not using      vitamin E 400 UNIT capsule      No facility-administered medications prior to visit.     Objective:     BP 123/77   Pulse 74   Ht 5\' 9"  (1.753 m)   Wt 181 lb 6.4 oz (82.3 kg)   SpO2 94%   BMI 26.79 kg/m   SpO2: 94 % RA  Stoic amb wm nad/ walks with cane    HEENT : Oropharynx  clear       NECK :  without  apparent JVD/ palpable Nodes/TM    LUNGS: no acc muscle use,  Min barrel  contour chest wall with bilateral  slightly decreased bs s audible wheeze and  without cough on insp or exp maneuvers and min  Hyperresonant  to  percussion bilaterally    CV:  RRR  no s3 or murmur or increase in P2, and no edema   ABD:  soft and nontender with pos end  insp Hoover's  in the supine position.  No bruits or organomegaly appreciated   MS:  ext warm without deformities Or obvious joint restrictions  calf tenderness, cyanosis or clubbing     SKIN: warm and dry without lesions    NEURO:  alert, approp, nl sensorium with  no motor or cerebellar deficits  apparent.        I personally reviewed images and agree with radiology impression as follows:  CT chest s contrast 12/12/22 Mod centrilobular and paraseptal emphysema  15 x 8 mm irregular medial right apical nodule       Assessment   COPD GOLD 2 Active smoker -CT chest s contrast 12/12/22 Mod centrilobular and paraseptal emphysema - PFT's  01/03/23  FEV1  2.03 (76 % ) ratio 0.63  p 8 % improvement from saba p 0 prior to study with   FV curve classically concave   - 03/10/2023 reported not using trelegy as not helping - 03/10/2023   Walked on Ra  x  2  lap(s) =  approx 300  ft  @ slow/cane pace, stopped due to tired > s0b  with lowest 02 sats 92%   Reviewed: As I explained to this patient in detail:  although there may be copd present, it may not be clinically relevant:   it does not appear to be limiting activity tolerance any more than a set of worn tires limits someone from driving a car  around a parking lot.  A new set of Michelins might look good but would have no perceived impact on the performance of the car and would not be worth the cost.  That is to say:   this pt is so sedentary I don't recommend aggressive pulmonary rx at this point unless limiting symptoms arise or acute exacerbations become as issue, neither of which is the case now.  I asked the patient to contact this office at any time in the future should either of these problems arise but would use Breztri instead of trelegy since may work faster to convince him it's effective  Each maintenance medication was reviewed in detail including emphasizing most importantly the difference between maintenance and prns and under what circumstances the prns are to be triggered using an action plan format where appropriate.  Total time for H and P, chart review, counseling, reviewing dpi/hfa device(s) , directly observing portions of ambulatory 02 saturation study/ and generating customized AVS unique to this office visit / same day  charting > 40 min with pt new to me                Cigarette smoker 4-5 min discussion re active cigarette smoking in addition to office E&M  Ask about tobacco use:   ongoing Advise quitting     I reviewed the Fletcher curve with the patient that basically indicates  if you quit smoking when your best day FEV1 is still well preserved (as is clearly  the case here)  it is highly unlikely you will progress to severe disease and informed the patient there was  no medication on the market that has proven to alter the curve/ its downward trajectory  or the likelihood of progression of their disease(unlike other chronic medical conditions such as atheroclerosis where we do think we can change the natural hx with risk reducing meds)    Therefore stopping smoking and maintaining abstinence are  the most important aspects of care, not choice of inhalers or for that matter, doctors. Treatment other than smoking cessation  is entirely directed by severity of symptoms and focused also on reducing exacerbations, not attempting to change the natural history of the disease.   Assess willingness:  Not committed at this point Assist in quit attempt:  Per PCP when ready Arrange follow up:   Follow up per Primary Care planned           Sandrea Hughs, MD 03/10/2023

## 2023-03-11 DIAGNOSIS — F17210 Nicotine dependence, cigarettes, uncomplicated: Secondary | ICD-10-CM | POA: Insufficient documentation

## 2023-03-11 NOTE — Assessment & Plan Note (Signed)
Active smoker -CT chest s contrast 12/12/22 Mod centrilobular and paraseptal emphysema - PFT's  01/03/23  FEV1 2.03 (76 % ) ratio 0.63  p 8 % improvement from saba p 0 prior to study with   FV curve classically concave   - 03/10/2023 reported not using trelegy as not helping - 03/10/2023   Walked on Ra  x  2  lap(s) =  approx 300  ft  @ slow/cane pace, stopped due to tired > s0b  with lowest 02 sats 92%   Reviewed: As I explained to this patient in detail:  although there may be copd present, it may not be clinically relevant:   it does not appear to be limiting activity tolerance any more than a set of worn tires limits someone from driving a car  around a parking lot.  A new set of Michelins might look good but would have no perceived impact on the performance of the car and would not be worth the cost.  That is to say:   this pt is so sedentary I don't recommend aggressive pulmonary rx at this point unless limiting symptoms arise or acute exacerbations become as issue, neither of which is the case now.  I asked the patient to contact this office at any time in the future should either of these problems arise but would use Breztri instead of trelegy since may work faster to convince him it's effective  Each maintenance medication was reviewed in detail including emphasizing most importantly the difference between maintenance and prns and under what circumstances the prns are to be triggered using an action plan format where appropriate.  Total time for H and P, chart review, counseling, reviewing dpi/hfa device(s) , directly observing portions of ambulatory 02 saturation study/ and generating customized AVS unique to this office visit / same day charting > 40 min with pt new to me

## 2023-03-11 NOTE — Assessment & Plan Note (Signed)
4-5 min discussion re active cigarette smoking in addition to office E&M  Ask about tobacco use:   ongoing Advise quitting    I reviewed the Fletcher curve with the patient that basically indicates  if you quit smoking when your best day FEV1 is still well preserved (as is clearly  the case here)  it is highly unlikely you will progress to severe disease and informed the patient there was  no medication on the market that has proven to alter the curve/ its downward trajectory  or the likelihood of progression of their disease(unlike other chronic medical conditions such as atheroclerosis where we do think we can change the natural hx with risk reducing meds)    Therefore stopping smoking and maintaining abstinence are  the most important aspects of care, not choice of inhalers or for that matter, doctors.   Treatment other than smoking cessation  is entirely directed by severity of symptoms and focused also on reducing exacerbations, not attempting to change the natural history of the disease.   Assess willingness:  Not committed at this point Assist in quit attempt:  Per PCP when ready Arrange follow up:   Follow up per Primary Care planned       

## 2023-03-18 NOTE — Progress Notes (Signed)
Carelink Summary Report / Loop Recorder 

## 2023-03-20 ENCOUNTER — Other Ambulatory Visit: Payer: Self-pay | Admitting: Internal Medicine

## 2023-03-20 DIAGNOSIS — I10 Essential (primary) hypertension: Secondary | ICD-10-CM

## 2023-03-24 ENCOUNTER — Telehealth: Payer: Self-pay | Admitting: *Deleted

## 2023-03-24 NOTE — Telephone Encounter (Signed)
CALLED PATIENT TO INFORM OF CT FOR 04-04-23- ARRIVAL TIME- 2:15 PM @ [facility], NO RESTRICTIONS TO TEST, BLOOD TO BE DRAWN I-STAT IN RADIOLOGY, PATIENT TO RECEIVE RESULTS FROM ALISON PERKINS VIA TELEPHONE ON 04-21-23 @ 2:30 PM, SPOKE WITH PATIENT'S WIFE- CONNIE AND SHE IS AWARE OF THESE APPTS. AND THE INSTRUCTIONS

## 2023-03-31 ENCOUNTER — Ambulatory Visit (INDEPENDENT_AMBULATORY_CARE_PROVIDER_SITE_OTHER): Payer: Medicare Other

## 2023-03-31 ENCOUNTER — Ambulatory Visit (INDEPENDENT_AMBULATORY_CARE_PROVIDER_SITE_OTHER): Payer: Medicare Other | Admitting: Neurology

## 2023-03-31 ENCOUNTER — Encounter: Payer: Self-pay | Admitting: Neurology

## 2023-03-31 VITALS — BP 124/76 | HR 66 | Ht 69.0 in | Wt 186.4 lb

## 2023-03-31 DIAGNOSIS — G40109 Localization-related (focal) (partial) symptomatic epilepsy and epileptic syndromes with simple partial seizures, not intractable, without status epilepticus: Secondary | ICD-10-CM

## 2023-03-31 DIAGNOSIS — I639 Cerebral infarction, unspecified: Secondary | ICD-10-CM

## 2023-03-31 LAB — CUP PACEART REMOTE DEVICE CHECK
Date Time Interrogation Session: 20240705230532
Implantable Pulse Generator Implant Date: 20240223

## 2023-03-31 MED ORDER — CLOPIDOGREL BISULFATE 75 MG PO TABS: 75.0000 mg | ORAL_TABLET | Freq: Every day | ORAL | 3 refills | Status: DC

## 2023-03-31 MED ORDER — LEVETIRACETAM 500 MG PO TABS: 500.0000 mg | ORAL_TABLET | Freq: Two times a day (BID) | ORAL | 3 refills | Status: DC

## 2023-03-31 NOTE — Progress Notes (Signed)
NEUROLOGY FOLLOW UP OFFICE NOTE  Damon Parrish [id number] Sep 03, 1941  HISTORY OF PRESENT ILLNESS: I had the pleasure of seeing Damon Parrish in follow-up in the neurology clinic on 03/31/2023.  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 He is again accompanied by his wife who helps supplement the history today. Records and images were personally reviewed where available. He had focal left-sided seizures in the setting of an acute infarct in the right frontoparietal region in 2017. He did well and Levetiracetam was weaned off after 6 months, then in 2020, had another episdoe of involuntary left leg movements and weakness. Levetiracetam restarted but he only took it once a day. He did well until 11/13/22 when he had left-sided weakness then again involuntary movements on the left hand. MRI brain showed acute right punctate MCA/ACA watershed infarcts, concerning for emboli source. CTA head and neck no significant stenosis, there was mild atherosclerotic change at the right carotid bifurcation but no stenosis, right ICA stent in the distal siphon to supraclinoid ICA with no residual aneurysm flow. EEG showed right hemisphere slowing. He has a loop recorder and continues on Plavix 75mg  daily. He is on Levetiracetam 500mg  BID with no further focal seizures since 11/14/22. Sometimes his wife notices a left hand tremor. He denies any focal numbness/tingling/weakness, jerking or dystonic posturing. He denies any headaches, dizziness. His last fall was in February. He is sleeping well. Mood is good. No side effects on medications. He always uses a cane, no neck/back or knee pain. He has been undergoing radiation for right lung NSCLC and is scheduled for repeat imaging in a few weeks.    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 Records from [facility] and recent hospitalization were reviewed and will be summarized as follows. In 12/2015, he presented for new onset seizures with left arm>leg rhythmic shaking lasting for an hour. MRI brain showed patchy acute infarct in the right frontoparietal MCA/ACA territory. EEG was normal. He was discharged home on Levetiracetam. He had a 30-day event monitor at that time which was normal. Since he was 6 months out from stroke and seizure, Levetiracetam was weaned off in 2017 and EEG after was normal. In 12/2018, his wife reports he had an episode of involuntary left leg movements and weakness. Levetiracetam 500mg  BID was restarted. Over time, his wife reports that he had been taking it once a day. He was event-free for almost 4 years until 11/13/22 when he noticed he could not hold the fork in his left hand. His left leg also got weak. After he finished eating, he noticed involuntary left hand movements. He had a witnessed episode on 11/14/22, his wife reports his left hand clenched then fingers extended out. MRI brain showed acute right punctate MCA/ACA watershed infarcts, concerning for embolic source. CTA head and neck did not show any significant stenosis, there was mild atherosclerotic change at the right carotid bifurcation but no stenosis, right ICA stent in the distal siphon to supraclinoid ICA with no residual aneurysm flow. Overnight EEG no epileptiform discharges, there was intermittent right hemisphere slowing. Echocardiogram showed EF of 60-65%, HbA1c 6.1, LDL 81. He was discharged home on increased dose of Lipitor, DAPT for 3 weeks  followed by Plavix alone, and Levetiracetam 500mg  BID. Exam on hospital discharge indicated mild left arm weakness with ataxia, tremor on right upper extremity (baseline). He was also discharged with  a loop recorder due to concern for embolic source.   He and his wife deny any further seizures since 11/14/22. He is tolerating LEV 500mg  BID without significant side effects. His wife denies any staring/unresponsive episodes. He reports he was fully aware during the seizures, no difficulty speaking or comprehending. He denies any olfactory/gustatory hallucinations, deja vu, rising epigastric sensation, focal numbness/tingling/weakness, myoclonic jerks. He denies any headaches, diplopia, dysarthria/dysphagia, neck/back pain, bowel/bladder dysfunction.His wife just recently noticed some twitching of his right upper arm when she held it. He has had a left hand tremor over the past year that does not affect daily activities. Memory is pretty good. His wife has been managing medications for years. He gets 7-9 hours of interrupted sleep, he may take naps during the day. He lives with his wife.   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 He had a normal birth and early development.  There is no history of febrile convulsions, CNS infections such as meningitis/encephalitis, significant traumatic brain injury, neurosurgical procedures, or family history of seizures.   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   GERD (gastroesophageal reflux disease)    H/O cerebral aneurysm repair 2016   "put stent in"   High cholesterol    History of blood transfusion ~ 1950   "while in hospital w/pneumonia" (01/09/2018)   History of kidney stones    Hypertension    Nonruptured cerebral aneurysm    Pneumonia ~ 1950   Red man syndrome    Seizures (HCC) 12/27/2015; 12/28/2015   Stroke (HCC) 12/2015   denies residual on 01/09/2018)    MEDICATIONS: Current Outpatient Medications on File Prior to Visit   Medication Sig Dispense Refill   acetaminophen (TYLENOL) 325 MG tablet Take 1-2 tablets (325-650 mg total) by mouth every 4 (four) hours as needed for mild pain.     amLODipine (NORVASC) 5 MG tablet TAKE 1 TABLET BY MOUTH DAILY 30 tablet 2   Ascorbic Acid (VITAMIN C) 1000 MG tablet Take 1,000 mg by mouth daily.     atorvastatin (LIPITOR) 80 MG tablet Take 1 tablet (80 mg total) by mouth at bedtime. 90 tablet 1   bimatoprost (LUMIGAN) 0.03 % ophthalmic solution Place 1 drop into the right eye at bedtime.     Brinzolamide-Brimonidine (SIMBRINZA) 1-0.2 % SUSP Place 1 drop into the right eye 2 (two) times daily.     calcium carbonate (OSCAL) 1500 (600 Ca) MG TABS tablet Take 600 mg of elemental calcium by mouth daily with breakfast.     Cholecalciferol 125 MCG (5000 UT) TABS Take 5,000 Units by mouth daily.     clopidogrel (PLAVIX) 75 MG tablet Take 1 tablet (75 mg total) by mouth daily. 90 tablet 3   finasteride (PROSCAR) 5 MG tablet Take 1 tablet (5 mg total) by mouth daily. 30 tablet 0   fluticasone (FLONASE) 50 MCG/ACT nasal spray Place 2 sprays into both nostrils daily.     levETIRAcetam (KEPPRA) 500 MG tablet Take 1 tablet (500 mg total) by mouth 2 (two) times daily. 180 tablet 3   Multiple Vitamin (  MULTIVITAMIN) capsule Take 1 capsule by mouth daily. 50+     multivitamin-lutein (OCUVITE-LUTEIN) CAPS capsule Take 1 capsule by mouth daily.     prednisoLONE acetate (PRED FORTE) 1 % ophthalmic suspension Place 1 drop into the left eye in the morning.     senna-docusate (SENOKOT-S) 8.6-50 MG tablet Take 2 tablets by mouth at bedtime as needed for mild constipation. (Patient taking differently: Take 1 tablet by mouth daily.)     tamsulosin (FLOMAX) 0.4 MG CAPS capsule Take 1 capsule (0.4 mg total) by mouth daily after supper.     timolol (TIMOPTIC) 0.25 % ophthalmic solution Place 1 drop into the right eye 2 (two) times daily.     TRELEGY ELLIPTA 100-62.5-25 MCG/ACT AEPB Inhale 1 puff into the  lungs daily.     vitamin E 400 UNIT capsule Take 400 Units by mouth daily.     No current facility-administered medications on file prior to visit.    ALLERGIES: Allergies  Allergen Reactions   Cephalosporins Rash    Full body rash with systemic symptoms   Vancomycin Rash    Full body rash with systemic symptoms    FAMILY HISTORY: Family History  Problem Relation Age of Onset   Lymphoma Mother    Dementia Mother    Aneurysm Father    Colon cancer Neg Hx     SOCIAL HISTORY: Social History   Socioeconomic History   Marital status: Married    Spouse name: Not on file   Number of children: Not on file   Years of education: Not on file   Highest education level: 12th grade  Occupational History   Not on file  Tobacco Use   Smoking status: Every Day    Packs/day: 1.00    Years: 61.00    Additional pack years: 0.00    Total pack years: 61.00    Types: Cigarettes   Smokeless tobacco: Never   Tobacco comments:    Smokes 4 packs of cigarettes in a week. 12/05/22 Tay    1/2 ppd per pt 01/10/23  Vaping Use   Vaping Use: Never used  Substance and Sexual Activity   Alcohol use: No    Alcohol/week: 0.0 standard drinks of alcohol   Drug use: No   Sexual activity: Not Currently  Other Topics Concern   Not on file  Social History Narrative   Are you right handed or left handed? Right handed    Are you currently employed ? Retired    What is your current occupation?   Do you live at home alone?no    Who lives with you? wife   What type of home do you live in: 1 story or 2 story?  1 story, ranch home 1 step        Social Determinants of Health   Financial Resource Strain: Low Risk  (01/13/2023)   Overall Financial Resource Strain (CARDIA)    Difficulty of Paying Living Expenses: Not hard at all  Food Insecurity: No Food Insecurity (01/13/2023)   Hunger Vital Sign    Worried About Running Out of Food in the Last Year: Never true    Ran Out of Food in the Last Year:  Never true  Transportation Needs: No Transportation Needs (01/13/2023)   PRAPARE - Administrator, Civil Service (Medical): No    Lack of Transportation (Non-Medical): No  Physical Activity: Unknown (01/13/2023)   Exercise Vital Sign    Days of Exercise per Week: Patient declined  Minutes of Exercise per Session: Not on file  Stress: No Stress Concern Present (01/13/2023)   [employer] of Occupational Health - Occupational Stress Questionnaire    Feeling of Stress : Not at all  Social Connections: Socially Integrated (01/13/2023)   Social Connection and Isolation Panel [NHANES]    Frequency of Communication with Friends and Family: More than three times a week    Frequency of Social Gatherings with Friends and Family: Once a week    Attends Religious Services: More than 4 times per year    Active Member of [employer] or Organizations: Yes    Attends Engineer, structural: More than 4 times per year    Marital Status: Married  Catering manager Violence: Not At Risk (11/14/2022)   Humiliation, Afraid, Rape, and Kick questionnaire    Fear of Current or Ex-Partner: No    Emotionally Abused: No    Physically Abused: No    Sexually Abused: No     PHYSICAL EXAM: Vitals:   03/31/23 1049  BP: 124/76  Pulse: 66  SpO2: 98%   General: No acute distress Head:  Normocephalic/atraumatic Skin/Extremities: No rash, no edema Neurological Exam: alert and awake. No aphasia or dysarthria. Fund of knowledge is appropriate.  Attention and concentration are normal.   Cranial nerves: Pupils equal, round. Extraocular movements intact with no nystagmus. Visual fields full.  No facial asymmetry.  Motor: Bulk and tone normal, muscle strength 5/5 throughout with no pronator drift, no difficulties with fine finger movements.   Finger to nose testing intact.  Gait slow and cautious with cane, no ataxia. No tremors in office today.   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 None since 11/14/22, continue Levetiracetam 500mg  BID. Etiology of stroke unknown, he has a loop recorder with no report of atrial fibrillation, continue Plavix 75mg  daily. He has upcoming eye surgery, I had spoken to Dr. Mearl Latin last month re: Plavix and eye surgery. Discussed the risk of stroke when Plavix is held. From seizure standpoint, advised to continue seizure medication even if NPO for procedure. He is aware of [place] driving laws to stop driving after a seizure until 6 months seizure-free. Follow-up in 6 months, call for any changes.    Thank you for allowing me to participate in his care.  Please do not hesitate to call for any questions or concerns.    Patrcia Dolly, M.D.   CC: Dr. Durwin Nora

## 2023-03-31 NOTE — Patient Instructions (Signed)
Good to see you. Continue Levetiracetam (Keppra) 500mg  twice a day. Hold Plavix as instructed for surgery, then restart as instructed by surgeon. Follow-up in 6 months, call for any changes.    Seizure Precautions: 1. If medication has been prescribed for you to prevent seizures, take it exactly as directed.  Do not stop taking the medicine without talking to your doctor first, even if you have not had a seizure in a long time.   2. Avoid activities in which a seizure would cause danger to yourself or to others.  Don't operate dangerous machinery, swim alone, or climb in high or dangerous places, such as on ladders, roofs, or girders.  Do not drive unless your doctor says you may.  3. If you have any warning that you may have a seizure, lay down in a safe place where you can't hurt yourself.    4.  No driving for 6 months from last seizure, as per [facility].   Please refer to the following link on the Epilepsy Foundation of America's website for more information: http://www.epilepsyfoundation.org/answerplace/Social/driving/drivingu.cfm   5.  Maintain good sleep hygiene. Avoid alcohol  6.  Contact your doctor if you have any problems that may be related to the medicine you are taking.  7.  Call 911 and bring the patient back to the ED if:        A.  The seizure lasts longer than 5 minutes.       B.  The patient doesn't awaken shortly after the seizure  C.  The patient has new problems such as difficulty seeing, speaking or moving  D.  The patient was injured during the seizure  E.  The patient has a temperature over 102 F (39C)  F.  The patient vomited and now is having trouble breathing

## 2023-04-04 ENCOUNTER — Ambulatory Visit (HOSPITAL_COMMUNITY)
Admission: RE | Admit: 2023-04-04 | Discharge: 2023-04-04 | Disposition: A | Discharge status: Home / Self Care | Payer: Medicare Other | Source: Ambulatory Visit | Attending: Radiation Oncology | Admitting: Radiation Oncology

## 2023-04-04 DIAGNOSIS — C3411 Malignant neoplasm of upper lobe, right bronchus or lung: Secondary | ICD-10-CM | POA: Insufficient documentation

## 2023-04-04 LAB — POCT I-STAT CREATININE: Creatinine, Ser: 1.1 mg/dL (ref 0.61–1.24)

## 2023-04-04 MED ORDER — IOHEXOL 300 MG/ML  SOLN
100.0000 mL | Freq: Once | INTRAMUSCULAR | Status: AC | PRN
  Administered 2023-04-04: 100 mL via INTRAVENOUS

## 2023-04-14 ENCOUNTER — Ambulatory Visit: Payer: Medicare Other | Admitting: Radiation Oncology

## 2023-04-14 ENCOUNTER — Telehealth: Payer: Self-pay | Admitting: Radiation Oncology

## 2023-04-14 DIAGNOSIS — C3411 Malignant neoplasm of upper lobe, right bronchus or lung: Secondary | ICD-10-CM

## 2023-04-14 HISTORY — PX: EYE SURGERY: SHX253

## 2023-04-14 NOTE — Telephone Encounter (Signed)
I spoke with the patient and we reviewed the results from his recent CT scan of the chest. We will plan to repeat in about 4 months time to follow up on a mediastinal node that was considered upper limits of normal. He is in agreement and prefers his CT scans to be performed at [facility]. New orders were placed.

## 2023-04-16 NOTE — Progress Notes (Signed)
Carelink Summary Report / Loop Recorder 

## 2023-04-17 ENCOUNTER — Ambulatory Visit (INDEPENDENT_AMBULATORY_CARE_PROVIDER_SITE_OTHER): Payer: Medicare Other | Admitting: Internal Medicine

## 2023-04-17 ENCOUNTER — Encounter: Payer: Self-pay | Admitting: Internal Medicine

## 2023-04-17 VITALS — BP 122/61 | HR 77 | Ht 69.0 in | Wt 187.2 lb

## 2023-04-17 DIAGNOSIS — I10 Essential (primary) hypertension: Secondary | ICD-10-CM

## 2023-04-17 DIAGNOSIS — C3491 Malignant neoplasm of unspecified part of right bronchus or lung: Secondary | ICD-10-CM

## 2023-04-17 DIAGNOSIS — I63511 Cerebral infarction due to unspecified occlusion or stenosis of right middle cerebral artery: Secondary | ICD-10-CM

## 2023-04-17 DIAGNOSIS — I639 Cerebral infarction, unspecified: Secondary | ICD-10-CM

## 2023-04-17 MED ORDER — FINASTERIDE 5 MG PO TABS: 5.0000 mg | ORAL_TABLET | Freq: Every day | ORAL | 1 refills | Status: AC

## 2023-04-17 MED ORDER — TAMSULOSIN HCL 0.4 MG PO CAPS: 0.4000 mg | ORAL_CAPSULE | Freq: Every day | ORAL | 1 refills | Status: DC

## 2023-04-17 MED ORDER — AMLODIPINE BESYLATE 5 MG PO TABS: 5.0000 mg | ORAL_TABLET | Freq: Every day | ORAL | 1 refills | Status: DC

## 2023-04-17 MED ORDER — ATORVASTATIN CALCIUM 80 MG PO TABS
80.0000 mg | ORAL_TABLET | Freq: Every day | ORAL | 1 refills | Status: DC
Start: 2023-04-17 — End: 2023-11-03

## 2023-04-17 NOTE — Assessment & Plan Note (Signed)
Plavix was held in the setting of eye surgery, however he has resumed Plavix and statin therapy and has not experienced any complications.  Recently evaluated by neurology for follow-up.

## 2023-04-17 NOTE — Assessment & Plan Note (Signed)
He has completed SBRT.  CT chest was repeated earlier this month and showed no substantial changes.  A CT will be repeated in 4 months.

## 2023-04-17 NOTE — Progress Notes (Signed)
Established Patient Office Visit  Subjective   Patient ID: Damon Parrish, male    DOB: 1941-03-17  Age: 82 y.o. MRN: [id number]  Chief Complaint  Patient presents with   Hypertension    Follow up   Damon Parrish returns to care today for follow-up.  He was last evaluated by me on 4/25.  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  In the interim he has completed SBRT for treatment of NSCLC of the right lung.  He has establish care with pulmonology.  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  Damon Parrish reports feeling fairly well today.  He endorses dull discomfort in the right eye but is otherwise asymptomatic and has no acute concerns to discuss.  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   GERD (gastroesophageal reflux disease)    H/O cerebral aneurysm repair 2016   "put stent in"   High cholesterol    History of blood transfusion ~ 1950   "while in hospital w/pneumonia" (01/09/2018)   History of kidney stones    Hypertension    Nonruptured cerebral aneurysm    Pneumonia ~ 1950   Red man syndrome    Seizures (HCC) 12/27/2015; 12/28/2015   Stroke (HCC) 12/2015   denies residual on 01/09/2018)   Past Surgical History:  Procedure Laterality Date   BRONCHIAL BIOPSY  12/17/2022   Procedure: BRONCHIAL BIOPSIES;  Surgeon: Josephine Igo, DO;  Location: [facility] ENDOSCOPY;  Service: Pulmonary;;   BRONCHIAL NEEDLE ASPIRATION BIOPSY  12/17/2022   Procedure: BRONCHIAL NEEDLE ASPIRATION BIOPSIES;  Surgeon: Josephine Igo, DO;  Location: [facility] ENDOSCOPY;  Service: Pulmonary;;   CATARACT EXTRACTION W/ INTRAOCULAR LENS  IMPLANT, BILATERAL Bilateral     COLONOSCOPY     2013 per patient: done in [place], normal, next colonoscopy due in 10 years   EYE SURGERY Left 06/17/2022   EYE SURGERY Right 04/14/2023   FOOT FRACTURE SURGERY Left    "pins, etc. in there"   FRACTURE SURGERY     LOOP RECORDER INSERTION N/A 11/15/2022   Procedure: LOOP RECORDER INSERTION;  Surgeon: Maurice Small, MD;  Location: [facility] INVASIVE CV LAB;  Service: Cardiovascular;  Laterality: N/A;   RADIOLOGY WITH ANESTHESIA N/A 05/04/2015   Procedure: Pipeline Embolization;  Surgeon: Lisbeth Renshaw, MD;  Location: [facility] NEURO ORS;  Service: Radiology;  Laterality: N/A;   RADIOLOGY WITH ANESTHESIA N/A 05/31/2015   Procedure: Pipeline Embolization;  Surgeon: Lisbeth Renshaw, MD;  Location: [facility] OR;  Service: Radiology;  Laterality: N/A;   Social History   Tobacco Use   Smoking status: Every Day    Current packs/day: 1.00    Average packs/day: 1 pack/day for 61.0 years (61.0 ttl pk-yrs)    Types: Cigarettes   Smokeless tobacco: Never   Tobacco comments:    Smokes 4 packs of cigarettes in a week. 12/05/22 Tay    1/2 ppd per pt 01/10/23  Vaping Use   Vaping status: Never Used  Substance Use Topics   Alcohol use: No    Alcohol/week: 0.0 standard drinks of alcohol   Drug use: No   Family History  Problem Relation Age of Onset   Lymphoma Mother    Dementia Mother    Aneurysm Father    Colon cancer Neg Hx    Allergies  Allergen Reactions   Cephalosporins Rash    Full body rash with systemic symptoms   Vancomycin Rash    Full body rash with systemic symptoms   Review of Systems  Constitutional:  Negative for chills and fever.  HENT:  Negative for sore throat.   Eyes:  Positive for pain (Right eye, recent surgery).  Respiratory:  Negative for cough and shortness of breath.   Cardiovascular:  Negative for chest pain, palpitations and leg swelling.  Gastrointestinal:  Negative for abdominal pain, blood in stool, constipation, diarrhea, nausea and vomiting.   Genitourinary:  Negative for dysuria and hematuria.  Musculoskeletal:  Negative for myalgias.  Skin:  Negative for itching and rash.  Neurological:  Negative for dizziness and headaches.  Psychiatric/Behavioral:  Negative for depression and suicidal ideas.      Objective:     BP 122/61   Pulse 77   Ht 5\' 9"  (1.753 m)   Wt 187 lb 3.2 oz (84.9 kg)   SpO2 97%   BMI 27.64 kg/m  BP Readings from Last 3 Encounters:  04/17/23 122/61  03/31/23 124/76  03/10/23 123/77   Physical Exam Vitals reviewed.  Constitutional:      General: He is not in acute distress.    Appearance: Normal appearance. He is not ill-appearing.  HENT:     Head: Normocephalic and atraumatic.     Right Ear: External ear normal.     Left Ear: External ear normal.     Nose: Nose normal. No congestion or rhinorrhea.     Mouth/Throat:     Mouth: Mucous membranes are moist.     Pharynx: Oropharynx is clear.  Eyes:     General: No scleral icterus.    Extraocular Movements: Extraocular movements intact.     Conjunctiva/sclera: Conjunctivae normal.     Pupils: Pupils are equal, round, and reactive to light.  Cardiovascular:     Rate and Rhythm: Normal rate and regular rhythm.     Pulses: Normal pulses.     Heart sounds: Normal heart sounds. No murmur heard. Pulmonary:     Effort: Pulmonary effort is normal.     Breath sounds: Normal breath sounds. No wheezing, rhonchi or rales.  Abdominal:     General: Abdomen is flat. Bowel sounds are normal. There is no distension.     Palpations: Abdomen is soft.     Tenderness: There is no abdominal tenderness.  Musculoskeletal:        General: No swelling or deformity. Normal range of motion.     Cervical back: Normal range of motion.  Skin:    General: Skin is warm and dry.     Capillary Refill: Capillary refill takes less than 2 seconds.  Neurological:     General: No focal deficit present.     Mental Status: He is alert and oriented to person, place, and time.      Motor: No weakness.     Comments: 4/5 strength in LUE compared to RUE.  Psychiatric:        Mood and Affect: Mood normal.        Behavior: Behavior normal.        Thought Content: Thought content normal.   Last CBC Lab Results  Component Value Date   WBC 7.3 12/02/2022   HGB 14.5  12/02/2022   HCT 41.4 12/02/2022   MCV 99.0 12/02/2022   MCH 34.7 (H) 12/02/2022   RDW 12.7 12/02/2022   PLT 259 12/02/2022   Last metabolic panel Lab Results  Component Value Date   GLUCOSE 127 (H) 12/02/2022   NA 134 (L) 12/02/2022   K 4.3 12/02/2022   CL 99 12/02/2022   CO2 29 12/02/2022   BUN 14 12/02/2022   CREATININE 1.10 04/04/2023   GFRNONAA >60 12/02/2022   CALCIUM 9.4 12/02/2022   PROT 6.6 12/02/2022   ALBUMIN 3.9 12/02/2022   BILITOT 0.3 12/02/2022   ALKPHOS 110 12/02/2022   AST 17 12/02/2022   ALT 19 12/02/2022   ANIONGAP 6 12/02/2022   Last lipids Lab Results  Component Value Date   CHOL 146 11/14/2022   HDL 46 11/14/2022   LDLCALC 81 11/14/2022   TRIG 96 11/14/2022   CHOLHDL 3.2 11/14/2022   Last hemoglobin A1c Lab Results  Component Value Date   HGBA1C 6.1 (H) 11/13/2022   Last thyroid functions Lab Results  Component Value Date   TSH 1.263 12/16/2017     Assessment & Plan:   Problem List Items Addressed This Visit       HTN (hypertension)    BP remains adequately controlled on current antihypertensive regimen.  No medication changes are indicated today.      CVA (cerebral vascular accident) (HCC) - Primary    Plavix was held in the setting of eye surgery, however he has resumed Plavix and statin therapy and has not experienced any complications.  Recently evaluated by neurology for follow-up.      NSCLC of right lung [facility])    He has completed SBRT.  CT chest was repeated earlier this month and showed no substantial changes.  A CT will be repeated in 4 months.       Return in about 6 months (around 10/18/2023) for CPE.    Billie Lade,  MD

## 2023-04-17 NOTE — Assessment & Plan Note (Signed)
BP remains adequately controlled on current antihypertensive regimen.  No medication changes are indicated today. 

## 2023-04-17 NOTE — Patient Instructions (Signed)
It was a pleasure to see you today.  Thank you for giving [place] the opportunity to be involved in your care.  Below is a brief recap of your visit and next steps.  We will plan to see you again in 6 months.  Summary No medication changes today Refills provided We will follow up in 6 months for your annual exam

## 2023-04-21 ENCOUNTER — Ambulatory Visit: Payer: Medicare Other | Admitting: Radiation Oncology

## 2023-04-28 ENCOUNTER — Ambulatory Visit
Admission: RE | Admit: 2023-04-28 | Discharge: 2023-04-28 | Disposition: A | Discharge status: Home / Self Care | Payer: Medicare Other | Source: Ambulatory Visit | Attending: Radiation Oncology | Admitting: Radiation Oncology

## 2023-04-28 DIAGNOSIS — Z510 Encounter for antineoplastic radiation therapy: Secondary | ICD-10-CM | POA: Insufficient documentation

## 2023-04-28 DIAGNOSIS — F17210 Nicotine dependence, cigarettes, uncomplicated: Secondary | ICD-10-CM | POA: Insufficient documentation

## 2023-04-28 DIAGNOSIS — C3411 Malignant neoplasm of upper lobe, right bronchus or lung: Secondary | ICD-10-CM | POA: Insufficient documentation

## 2023-04-28 NOTE — Progress Notes (Signed)
  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   Symptoms of fatigue have improved since completing therapy.  Symptoms of skin changes have improved since completing therapy.  Symptoms of esophagitis have improved since completing therapy.   The patient has not scheduled a follow up with his medical oncologist Dr. Si Gaul for ongoing care, and was encouraged to call if he  develops concerns or questions regarding radiation.   This concludes the interaction.  Ruel Favors, LPN

## 2023-05-05 ENCOUNTER — Ambulatory Visit: Payer: Medicare Other

## 2023-05-05 DIAGNOSIS — I639 Cerebral infarction, unspecified: Secondary | ICD-10-CM | POA: Diagnosis not present

## 2023-05-19 NOTE — Progress Notes (Signed)
Carelink Summary Report / Loop Recorder 

## 2023-06-09 ENCOUNTER — Ambulatory Visit (INDEPENDENT_AMBULATORY_CARE_PROVIDER_SITE_OTHER): Payer: Medicare Other

## 2023-06-09 DIAGNOSIS — I639 Cerebral infarction, unspecified: Secondary | ICD-10-CM | POA: Diagnosis not present

## 2023-06-09 NOTE — Progress Notes (Unsigned)
Damon Parrish, male    DOB: 27-Jan-1941    MRN: [id number]   Brief patient profile:  64  yowm active smoker  referred to pulmonary clinic in [place]  03/10/2023 by Dr Christel Mormon  for doe s/p SBRT for NSCLC R Lung  1A complete Feb 18 2023 with GOLD 2 criteria documented 01/03/23.     History of Present Illness  03/10/2023  Pulmonary/ 1st office eval/ Karlee Staff / [place] [facility]  Chief Complaint  Patient presents with   Consult  Dyspnea:  mb and back is ok slow pace but does not stop no better on trelegy x 150 ft not flat  Cough: none  Sleep: bed is flat/ one pillow SABA use: none  02: none Rec If you start having more trouble walking to MB and back, please make appointment to start you on a different inhaler from the one you used previously  The key is to stop smoking completely before smoking completely stops you!   06/10/2023  f/u ov/[place] office/Graycen Degan re: GOLD 2 copd  maint on no rx   Chief Complaint  Patient presents with   Follow-up   Dyspnea:  no change in doe on vs off trelegy  Cough: some am cough mucoid minimal vol Sleeping: flat bed / one pillow s resp cc  SABA use: none  02: none   No obvious day to day or daytime variability or assoc excess/ purulent sputum or mucus plugs or hemoptysis or cp or chest tightness, subjective wheeze or overt sinus or hb symptoms.    Also denies any obvious fluctuation of symptoms with weather or environmental changes or other aggravating or alleviating factors except as outlined above   No unusual exposure hx or h/o childhood pna/ asthma or knowledge of premature birth.  Current Allergies, Complete Past Medical History, Past Surgical History, Family History, and Social History were reviewed in [employer] record.  ROS  The following are not active complaints unless bolded Hoarseness, sore throat, dysphagia, dental problems, itching, sneezing,  nasal congestion or discharge of excess mucus or purulent  secretions, ear ache,   fever, chills, sweats, unintended wt loss or wt gain, classically pleuritic or exertional cp,  orthopnea pnd or arm/hand swelling  or leg swelling, presyncope, palpitations, abdominal pain, anorexia, nausea, vomiting, diarrhea  or change in bowel habits or change in bladder habits, change in stools or change in urine, dysuria, hematuria,  rash, arthralgias, visual complaints, headache, numbness, weakness or ataxia or problems with walking or coordination,  change in mood or  memory.        Current Meds  Medication Sig   acetaminophen (TYLENOL) 325 MG tablet Take 1-2 tablets (325-650 mg total) by mouth every 4 (four) hours as needed for mild pain.   amLODipine (NORVASC) 5 MG tablet Take 1 tablet (5 mg total) by mouth daily.   Ascorbic Acid (VITAMIN C) 1000 MG tablet Take 1,000 mg by mouth daily.   atorvastatin (LIPITOR) 80 MG tablet Take 1 tablet (80 mg total) by mouth at bedtime.   bimatoprost (LUMIGAN) 0.03 % ophthalmic solution Place 1 drop into the right eye at bedtime.   Brinzolamide-Brimonidine (SIMBRINZA) 1-0.2 % SUSP Place 1 drop into the right eye 2 (two) times daily.   calcium carbonate (OSCAL) 1500 (600 Ca) MG TABS tablet Take 600 mg of elemental calcium by mouth daily with breakfast.   Cholecalciferol 125 MCG (5000 UT) TABS Take 5,000 Units by mouth daily.   clopidogrel (PLAVIX) 75 MG  tablet Take 1 tablet (75 mg total) by mouth daily.   finasteride (PROSCAR) 5 MG tablet Take 1 tablet (5 mg total) by mouth daily.   fluticasone (FLONASE) 50 MCG/ACT nasal spray Place 2 sprays into both nostrils daily.   levETIRAcetam (KEPPRA) 500 MG tablet Take 1 tablet (500 mg total) by mouth 2 (two) times daily.   Multiple Vitamin (MULTIVITAMIN) capsule Take 1 capsule by mouth daily. 50+   multivitamin-lutein (OCUVITE-LUTEIN) CAPS capsule Take 1 capsule by mouth daily.   prednisoLONE acetate (PRED FORTE) 1 % ophthalmic suspension Place 1 drop into the left eye in the morning.    senna-docusate (SENOKOT-S) 8.6-50 MG tablet Take 2 tablets by mouth at bedtime as needed for mild constipation. (Patient taking differently: Take 1 tablet by mouth daily.)   tamsulosin (FLOMAX) 0.4 MG CAPS capsule Take 1 capsule (0.4 mg total) by mouth daily after supper.   timolol (TIMOPTIC) 0.25 % ophthalmic solution Place 1 drop into the right eye 2 (two) times daily.   TRELEGY ELLIPTA 100-62.5-25 MCG/ACT AEPB Inhale 1 puff into the lungs daily.   vitamin E 400 UNIT capsule Take 400 Units by mouth daily.          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   GERD (gastroesophageal reflux disease)    H/O cerebral aneurysm repair 2016   "put stent in"   High cholesterol    History of blood transfusion ~ 1950   "while in hospital w/pneumonia" (01/09/2018)   History of kidney stones    Hypertension    Nonruptured cerebral aneurysm    Pneumonia ~ 1950   Red man syndrome    Seizures (HCC) 12/27/2015; 12/28/2015   Stroke (HCC) 12/2015   denies residual on 01/09/2018)       Objective:    Wts  06/10/2023       187   04/17/23 187 lb 3.2 oz (84.9 kg)  03/31/23 186 lb 6.4 oz (84.6 kg)  03/10/23 181 lb 6.4 oz (82.3 kg)    Vital signs reviewed  06/10/2023  - Note at rest 02 sats  98% on RA    General appearance:    amb (with cane) eldery wm nad   HEENT : Oropharynx  clear.       NECK :  without  apparent JVD/ palpable Nodes/TM    LUNGS: no acc muscle use,  Mild barrel  contour chest wall with bilateral  Distant bs s audible wheeze and  without cough on insp or exp maneuvers  and mild  Hyperresonant  to  percussion bilaterally     CV:  RRR  no s3 or murmur or increase in P2, and no edema   ABD:  soft and nontender with pos end  insp Hoover's  in the supine position.  No bruits or organomegaly appreciated   MS:   Nl gait/ ext warm without deformities Or obvious joint restrictions  calf tenderness, cyanosis or clubbing     SKIN: warm and dry without lesions    NEURO:  alert, approp, nl sensorium with  no motor or cerebellar deficits apparent.        I personally reviewed images and agree with radiology impression as follows:  CT chest s contrast 12/12/22 Mod centrilobular and paraseptal emphysema  15 x 8  mm irregular medial right apical nodule       Assessment

## 2023-06-10 ENCOUNTER — Ambulatory Visit (INDEPENDENT_AMBULATORY_CARE_PROVIDER_SITE_OTHER): Payer: Medicare Other | Admitting: Internal Medicine

## 2023-06-10 ENCOUNTER — Encounter: Payer: Self-pay | Admitting: Internal Medicine

## 2023-06-10 VITALS — BP 133/68 | HR 72 | Ht 69.0 in | Wt 187.0 lb

## 2023-06-10 DIAGNOSIS — Z720 Tobacco use: Secondary | ICD-10-CM | POA: Diagnosis not present

## 2023-06-10 DIAGNOSIS — J449 Chronic obstructive pulmonary disease, unspecified: Secondary | ICD-10-CM | POA: Diagnosis not present

## 2023-06-10 LAB — CUP PACEART REMOTE DEVICE CHECK
Date Time Interrogation Session: 20240913230404
Implantable Pulse Generator Implant Date: 20240223

## 2023-06-10 NOTE — Assessment & Plan Note (Signed)

## 2023-06-10 NOTE — Patient Instructions (Signed)
Pulmonary follow up is as needed for worse breathing

## 2023-06-10 NOTE — Assessment & Plan Note (Addendum)
Active smoker -CT chest s contrast 12/12/22 Mod centrilobular and paraseptal emphysema - PFT's  01/03/23  FEV1 2.03 (76 % ) ratio 0.63  p 8 % improvement from saba p 0 prior to study with   FV curve classically concave   - 03/10/2023 reported not using trelegy as not helping so leave off  - 03/10/2023   Walked on Ra  x  2  lap(s) =  approx 300 ft  @ slow/cane pace, stopped due to tired > s0b  with lowest 02 sats 92%     - 06/10/2023   Walked on RA/no resp rx   x  3  lap(s) =  approx 450  ft  @ mod/cane pace, stopped due to end of study  with lowest 02 sats 97% s sob.    As I explained to this patient in detail:  although there is clearly copd present, it may not be clinically relevant:   it does not appear to be limiting activity tolerance   That is to say:   this pt is so sedentary I don't recommend aggressive pulmonary rx at this point unless limiting symptoms arise or acute exacerbations become as issue, neither of which is the case now.  I asked the patient to contact this office at any time in the future should either of these problems arise.    Each maintenance medication was reviewed in detail including emphasizing most importantly the difference between maintenance and prns and under what circumstances the prns are to be triggered using an action plan format where appropriate.  Total time for H and P, chart review, counseling,  directly observing portions of ambulatory 02 saturation study/ and generating customized AVS unique to this office visit / same day charting = 30 min final summary f/u

## 2023-06-23 NOTE — Progress Notes (Signed)
Carelink Summary Report / Loop Recorder 

## 2023-07-09 ENCOUNTER — Ambulatory Visit (HOSPITAL_COMMUNITY)
Admission: RE | Admit: 2023-07-09 | Discharge: 2023-07-09 | Disposition: A | Discharge status: Home / Self Care | Payer: Medicare Other | Source: Ambulatory Visit | Attending: Radiation Oncology | Admitting: Radiation Oncology

## 2023-07-09 ENCOUNTER — Encounter (HOSPITAL_COMMUNITY): Payer: Self-pay

## 2023-07-09 DIAGNOSIS — C3411 Malignant neoplasm of upper lobe, right bronchus or lung: Secondary | ICD-10-CM | POA: Insufficient documentation

## 2023-07-09 LAB — POCT I-STAT CREATININE: Creatinine, Ser: 1 mg/dL (ref 0.61–1.24)

## 2023-07-09 MED ORDER — IOHEXOL 300 MG/ML  SOLN
75.0000 mL | Freq: Once | INTRAMUSCULAR | Status: AC | PRN
  Administered 2023-07-09: 75 mL via INTRAVENOUS

## 2023-07-14 ENCOUNTER — Ambulatory Visit: Payer: Medicare Other

## 2023-07-14 DIAGNOSIS — I639 Cerebral infarction, unspecified: Secondary | ICD-10-CM

## 2023-07-15 LAB — CUP PACEART REMOTE DEVICE CHECK
Date Time Interrogation Session: 20241020231719
Implantable Pulse Generator Implant Date: 20240223

## 2023-07-21 ENCOUNTER — Encounter: Payer: Self-pay | Admitting: Radiation Oncology

## 2023-07-21 ENCOUNTER — Ambulatory Visit
Admission: RE | Admit: 2023-07-21 | Discharge: 2023-07-21 | Disposition: A | Discharge status: Home / Self Care | Payer: Medicare Other | Source: Ambulatory Visit | Attending: Radiation Oncology | Admitting: Radiation Oncology

## 2023-07-21 DIAGNOSIS — C3411 Malignant neoplasm of upper lobe, right bronchus or lung: Secondary | ICD-10-CM | POA: Insufficient documentation

## 2023-07-21 NOTE — Progress Notes (Signed)
Telephone nursing appointment for review of most recent scan results. I verified patient's identity x2 and began nursing interview.   Patient reports doing well. Patient denies any related issues at this time.   Meaningful use complete.   Patient aware of their 2:30pm telephone appointment w/ Laurence Aly PA-C. I left my extension [phone] in case patient needs anything. Patient verbalized understanding. This concludes the nursing interview.   Patient contact [phone]     Ruel Favors, LPN

## 2023-07-21 NOTE — Progress Notes (Addendum)
 Radiation Oncology         [phone] ________________________________  Initial Outpatient Consultation - Conducted via telephone at patient request.  I spoke with the patient to conduct this visit via telephone. The patient was notified in advance and was offered an in person or telemedicine meeting to allow for face to face communication but instead preferred to proceed with a telephone visit.  Name: GLENNON KOPKO        MRN: [id number]  Date of Service: 07/21/2023 DOB: Sep 21, 1941  UU:[id number], Lucina Mellow, MD  Billie Lade, MD     REFERRING PHYSICIAN: Billie Lade, MD   DIAGNOSIS: The encounter diagnosis was Malignant neoplasm of right upper lobe of lung (HCC).   HISTORY OF PRESENT ILLNESS: CLAUDELL WOHLER is a 82 y.o. male with a diagnosis of lung cancer.  The patient was found to have a stroke and during his workup with a CT angio of the neck, a mass was seen in the right upper lobe.  An MRI of the brain without contrast on 11/13/2018 for the same day was negative for malignancy but showed acute to early subacute ischemia in the right corona radiata and chronic right subdural hematoma.  A CT chest that day without contrast measured the area seen on the neck scan in the right upper lobe to be 1.5 cm.  No evidence of adenopathy was appreciated.  On 12/12/2022 he underwent bronchoscopy and fine-needle aspirate from cytology showed non-small cell carcinoma consistent with adenocarcinoma.  He did have a PET scan as well on 12/12/2022 that showed hypermetabolic activity with an SUV of 5.5 in the right apex lesion.  He met with Dr. Cliffton Asters to discuss surgery after having adequate pulmonary function testing.  Given a recent stroke and difficulty with walking, it was felt that he was not best suited for surgical resection. Rather, he went on to complete SBRT.  Since his treatment, his scans have shown stability. He had a repeat scan on 07/09/23 that showed stability in the treated lesion measuring  12 mm, previously 14 mm in July 2024, and 16 mm prior to treatment. He's contacted by phone to review these results. Of note he continues to be followed by Dr. Sherene Sires in pulmonary medicine for management of his COPD and was seen in September 2024.   PREVIOUS RADIATION THERAPY:    02/12/23-02/18/23 SBRT Treatment The tumor in the RUL was treated with a course of stereotactic body radiation treatment. The patient received 54 Gy In 3 fractions at 18 G per fraction.   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   GERD (gastroesophageal reflux disease)    H/O cerebral aneurysm repair 2016   "put stent in"   High cholesterol    History of blood transfusion ~ 1950   "while in hospital w/pneumonia" (01/09/2018)   History of kidney stones    Hypertension    Nonruptured cerebral aneurysm    Pneumonia ~ 1950   Red man syndrome    Seizures (HCC) 12/27/2015; 12/28/2015   Stroke (HCC) 12/2015   denies residual on 01/09/2018)       PAST SURGICAL HISTORY: Past Surgical History:  Procedure Laterality Date   BRONCHIAL BIOPSY  12/17/2022   Procedure: BRONCHIAL BIOPSIES;  Surgeon: Josephine Igo,  DO;  Location: [facility];  Service: Pulmonary;;   BRONCHIAL NEEDLE ASPIRATION BIOPSY  12/17/2022   Procedure: BRONCHIAL NEEDLE ASPIRATION BIOPSIES;  Surgeon: Josephine Igo, DO;  Location: [facility];  Service: Pulmonary;;   CATARACT EXTRACTION W/ INTRAOCULAR LENS  IMPLANT, BILATERAL Bilateral    COLONOSCOPY     2013 per patient: done in [place], normal, next colonoscopy due in 10 years   EYE SURGERY Left 06/17/2022   EYE SURGERY Right 04/14/2023   FOOT FRACTURE SURGERY Left    "pins, etc. in there"   FRACTURE SURGERY     LOOP RECORDER INSERTION N/A 11/15/2022   Procedure: LOOP RECORDER INSERTION;  Surgeon:  Maurice Small, MD;  Location: [facility] INVASIVE CV [facility];  Service: Cardiovascular;  Laterality: N/A;   RADIOLOGY WITH ANESTHESIA N/A 05/04/2015   Procedure: Pipeline Embolization;  Surgeon: Lisbeth Renshaw, MD;  Location: [facility] NEURO ORS;  Service: Radiology;  Laterality: N/A;   RADIOLOGY WITH ANESTHESIA N/A 05/31/2015   Procedure: Pipeline Embolization;  Surgeon: Lisbeth Renshaw, MD;  Location: [facility] OR;  Service: Radiology;  Laterality: N/A;     FAMILY HISTORY:  Family History  Problem Relation Age of Onset   Lymphoma Mother    Dementia Mother    Aneurysm Father    Colon cancer Neg Hx      SOCIAL HISTORY:  reports that he has been smoking cigarettes. He has a 61 pack-year smoking history. He has never used smokeless tobacco. He reports that he does not drink alcohol and does not use drugs. He is married and accompanied by his wife. He lives in [place].    ALLERGIES: Cephalosporins and Vancomycin   MEDICATIONS:  Current Outpatient Medications  Medication Sig Dispense Refill   acetaminophen (TYLENOL) 325 MG tablet Take 1-2 tablets (325-650 mg total) by mouth every 4 (four) hours as needed for mild pain.     amLODipine (NORVASC) 5 MG tablet Take 1 tablet (5 mg total) by mouth daily. 90 tablet 1   Ascorbic Acid (VITAMIN C) 1000 MG tablet Take 1,000 mg by mouth daily.     atorvastatin (LIPITOR) 80 MG tablet Take 1 tablet (80 mg total) by mouth at bedtime. 90 tablet 1   bimatoprost (LUMIGAN) 0.03 % ophthalmic solution Place 1 drop into the right eye at bedtime.     Brinzolamide-Brimonidine (SIMBRINZA) 1-0.2 % SUSP Place 1 drop into the right eye 2 (two) times daily.     calcium carbonate (OSCAL) 1500 (600 Ca) MG TABS tablet Take 600 mg of elemental calcium by mouth daily with breakfast.     Cholecalciferol 125 MCG (5000 UT) TABS Take 5,000 Units by mouth daily.     clopidogrel (PLAVIX) 75 MG tablet Take 1 tablet (75 mg total) by mouth daily. 90 tablet 3   finasteride (PROSCAR) 5 MG tablet  Take 1 tablet (5 mg total) by mouth daily. 90 tablet 1   fluticasone (FLONASE) 50 MCG/ACT nasal spray Place 2 sprays into both nostrils daily.     levETIRAcetam (KEPPRA) 500 MG tablet Take 1 tablet (500 mg total) by mouth 2 (two) times daily. 180 tablet 3   Multiple Vitamin (MULTIVITAMIN) capsule Take 1 capsule by mouth daily. 50+     multivitamin-lutein (OCUVITE-LUTEIN) CAPS capsule Take 1 capsule by mouth daily.     prednisoLONE acetate (PRED FORTE) 1 % ophthalmic suspension Place 1 drop into the left eye in the morning.     senna-docusate (SENOKOT-S) 8.6-50 MG tablet Take 2 tablets by mouth at bedtime as needed  for mild constipation. (Patient taking differently: Take 1 tablet by mouth daily.)     tamsulosin (FLOMAX) 0.4 MG CAPS capsule Take 1 capsule (0.4 mg total) by mouth daily after supper. 90 capsule 1   timolol (TIMOPTIC) 0.25 % ophthalmic solution Place 1 drop into the right eye 2 (two) times daily.     TRELEGY ELLIPTA 100-62.5-25 MCG/ACT AEPB Inhale 1 puff into the lungs daily.     vitamin E 400 UNIT capsule Take 400 Units by mouth daily.     No current facility-administered medications for this encounter.     REVIEW OF SYSTEMS: On review of systems, the patient reports that he is doing well overall. He is not having shortness of breath, chest pain, or not having any changes in his tolerance for exertion. No other complaints are verbalized.      PHYSICAL EXAM:  Unable to assess due to encounter   ECOG = 0  0 - Asymptomatic (Fully active, able to carry on all predisease activities without restriction)  1 - Symptomatic but completely ambulatory (Restricted in physically strenuous activity but ambulatory and able to carry out work of a light or sedentary nature. For example, light housework, office work)  2 - Symptomatic, <50% in bed during the day (Ambulatory and capable of all self care but unable to carry out any work activities. Up and about more than 50% of waking  hours)  3 - Symptomatic, >50% in bed, but not bedbound (Capable of only limited self-care, confined to bed or chair 50% or more of waking hours)  4 - Bedbound (Completely disabled. Cannot carry on any self-care. Totally confined to bed or chair)  5 - Death   Santiago Glad MM, Creech RH, Tormey DC, et al. [phone]). "Toxicity and response criteria of the [facility]". Am. Evlyn Clines. Oncol. 5 (6): 649-55    LABORATORY DATA:  Lab Results  Component Value Date   WBC 7.3 12/02/2022   HGB 14.5 12/02/2022   HCT 41.4 12/02/2022   MCV 99.0 12/02/2022   PLT 259 12/02/2022   Lab Results  Component Value Date   NA 134 (L) 12/02/2022   K 4.3 12/02/2022   CL 99 12/02/2022   CO2 29 12/02/2022   Lab Results  Component Value Date   ALT 19 12/02/2022   AST 17 12/02/2022   ALKPHOS 110 12/02/2022   BILITOT 0.3 12/02/2022      RADIOGRAPHY: CT CHEST W CONTRAST  Result Date: 07/19/2023 CLINICAL DATA:  Non-small cell lung cancer. Status post SB RT radiation therapy. RIGHT upper lobe lung cancer. * Tracking Code: BO * EXAM: CT CHEST WITH CONTRAST TECHNIQUE: Multidetector CT imaging of the chest was performed during intravenous contrast administration. RADIATION DOSE REDUCTION: This exam was performed according to the departmental dose-optimization program which includes automated exposure control, adjustment of the mA and/or kV according to patient size and/or use of iterative reconstruction technique. CONTRAST:  75mL OMNIPAQUE IOHEXOL 300 MG/ML  SOLN COMPARISON:  No significant vascular findings. Normal heart size. No pericardial effusion. FINDINGS: Cardiovascular: No significant vascular findings. Normal heart size. No pericardial effusion. Mediastinum/Nodes: No axillary or supraclavicular adenopathy. No mediastinal or hilar adenopathy. No pericardial fluid. Esophagus normal. Lungs/Pleura: 12 mm treated nodule in the RIGHT upper lobe compares to 14 mm on prior. Visually nodule is unchanged  (image 30/series 3). Mild interstitial thickening surrounding the nodule constructions radiation change. Fiducial marker adjacent to the nodule. No new pulmonary nodules within the LEFT or RIGHT lung. Upper Abdomen:  Limited view of the liver, kidneys, pancreas are unremarkable. Normal adrenal glands. Musculoskeletal: No aggressive osseous lesion. IMPRESSION: 1. Stable treated nodule in the RIGHT upper lobe. 2. No evidence of thoracic metastatic adenopathy. 3. No new pulmonary nodules. Electronically Signed   By: Genevive Bi M.D.   On: 07/19/2023 12:17   CUP PACEART REMOTE DEVICE CHECK  Result Date: 07/15/2023 ILR summary report received. Battery status OK. Normal device function. No new symptom, tachy, brady, or pause episodes. No new AF episodes. Monthly summary reports and ROV/PRN. [facility]      IMPRESSION/PLAN: 1. Stage IA2, cT1bN0M0, NSCLC, adenocarcinoma of the RUL. The patient continues to be followed expectantly and radiographically is doing well. Clinically he seems to be doing well also. We will plan a follow up in 6 months time with repeat scan prior to that time. He is in agreement and will contact us sooner if he has questions or concerns.  2. COPD. The patient will follow up with Dr. Sherene Sires in surveillance.    This encounter was conducted via telephone.  The patient has provided two factor identification and has given verbal consent for this type of encounter and has been advised to only accept a meeting of this type in a secure network environment. The time spent during this encounter was 35 minutes including preparation, discussion, and coordination of the patient's care. The attendants for this meeting include  Ronny Bacon  and Quincy Carnes and his wife Ab Leaming. During the encounter,    Ronny Bacon was located at [facility] Radiation On[facility] Department.  Quincy Carnes was located at home with his wife Montravious Weigelt.      Osker Mason, [facility]   **Disclaimer: This note was dictated with voice recognition software. Similar sounding words can inadvertently be transcribed and this note may contain transcription errors which may not have been corrected upon publication of note.**

## 2023-07-30 NOTE — Progress Notes (Signed)
Carelink Summary Report / Loop Recorder 

## 2023-08-18 ENCOUNTER — Ambulatory Visit (INDEPENDENT_AMBULATORY_CARE_PROVIDER_SITE_OTHER): Payer: Medicare Other

## 2023-08-18 DIAGNOSIS — I639 Cerebral infarction, unspecified: Secondary | ICD-10-CM | POA: Diagnosis not present

## 2023-08-18 LAB — CUP PACEART REMOTE DEVICE CHECK
Date Time Interrogation Session: 20241122230455
Implantable Pulse Generator Implant Date: 20240223

## 2023-09-15 NOTE — Progress Notes (Signed)
Carelink Summary Report / Loop Recorder 

## 2023-09-22 ENCOUNTER — Ambulatory Visit (INDEPENDENT_AMBULATORY_CARE_PROVIDER_SITE_OTHER): Payer: Medicare Other

## 2023-09-22 DIAGNOSIS — I639 Cerebral infarction, unspecified: Secondary | ICD-10-CM

## 2023-09-23 LAB — CUP PACEART REMOTE DEVICE CHECK
Date Time Interrogation Session: 20241229231712
Implantable Pulse Generator Implant Date: 20240223

## 2023-10-23 ENCOUNTER — Encounter: Payer: Self-pay | Admitting: Neurology

## 2023-10-23 ENCOUNTER — Ambulatory Visit (INDEPENDENT_AMBULATORY_CARE_PROVIDER_SITE_OTHER): Payer: Medicare Other | Admitting: Neurology

## 2023-10-23 VITALS — BP 120/68 | HR 73 | Ht 69.0 in | Wt 192.4 lb

## 2023-10-23 DIAGNOSIS — G40109 Localization-related (focal) (partial) symptomatic epilepsy and epileptic syndromes with simple partial seizures, not intractable, without status epilepticus: Secondary | ICD-10-CM | POA: Diagnosis not present

## 2023-10-23 DIAGNOSIS — R251 Tremor, unspecified: Secondary | ICD-10-CM | POA: Diagnosis not present

## 2023-10-23 DIAGNOSIS — Z8673 Personal history of transient ischemic attack (TIA), and cerebral infarction without residual deficits: Secondary | ICD-10-CM

## 2023-10-23 MED ORDER — CLOPIDOGREL BISULFATE 75 MG PO TABS: 75.0000 mg | ORAL_TABLET | Freq: Every day | ORAL | 3 refills | Status: AC

## 2023-10-23 MED ORDER — LEVETIRACETAM 500 MG PO TABS: 500.0000 mg | ORAL_TABLET | Freq: Two times a day (BID) | ORAL | 3 refills | Status: DC

## 2023-10-23 NOTE — Progress Notes (Signed)
NEUROLOGY FOLLOW UP OFFICE NOTE  Damon Parrish [id number] 1941-01-17  HISTORY OF PRESENT ILLNESS: I had the pleasure of seeing Damon Parrish in follow-up in the neurology clinic on 10/23/2023.  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 He is again accompanied by his wife who helps supplement the history today.  Records and images were personally reviewed where available.  He had focal left-sided seizures in the setting of an acute infarct in the right frontoparietal region in 2017. He did well and Levetiracetam was weaned off after 6 months, then in 2020, had another episdoe of involuntary left leg movements and weakness. Levetiracetam restarted but he only took it once a day. He did well until 11/13/22 when he had left-sided weakness then again involuntary movements on the left hand. MRI brain showed acute right punctate MCA/ACA watershed infarcts, concerning for embolic source. CTA head and neck no significant stenosis, there was mild atherosclerotic change at the right carotid bifurcation but no stenosis, right ICA stent in the distal siphon to supraclinoid ICA with no residual aneurysm flow. EEG showed right hemisphere slowing.   Since his last visit, they deny any seizures affecting the left side since 11/13/22. He is on Levetiracetam 500mg  BID without side effects. He denies any staring/unresponsive episodes, gaps in time, olfactory/gustatory hallucinations, myoclonic jerks. They have noticed occasional right hand tremors, sometimes he feels weak on both arms. He states he uses his left hand mostly for activities so the tremor does not affect activities, they mostly notice it while sitting at the table, it may quickly jerk. No neck pain. No headaches, dizziness, vision changes. He fell on 11/28, "I guess I lost my balance." No loss of consciousness. He gets 8-10 hours of sleep. Mood is alright. He is on Plavix 75mg  daily for secondary stroke prevention. ILR has not  detected any atrial fibrillation. He is noted to favor right leg when walking with cane, they report right ankle surgery with screws many years ago, his wife feels his walking has worsened a little.   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 Records from [facility] and recent hospitalization were reviewed and will be summarized as follows. In 12/2015, he presented for new onset seizures with left arm>leg rhythmic shaking lasting for an hour. MRI brain showed patchy acute infarct in the right frontoparietal MCA/ACA territory. EEG was normal. He was discharged home on Levetiracetam. He had a 30-day event monitor at that time which was normal. Since he was 6 months out from stroke and seizure, Levetiracetam was weaned off in 2017 and EEG after was normal. In 12/2018, his wife reports he had an episode of involuntary left leg movements and weakness. Levetiracetam 500mg  BID was restarted. Over time, his wife reports that he had been taking it once a day. He was event-free for almost 4 years until 11/13/22 when he noticed he could not hold the fork in his left hand. His left leg also got weak. After he finished eating, he noticed involuntary left hand movements. He had a witnessed episode on 11/14/22, his wife reports his left hand clenched then fingers extended out. MRI brain showed acute right punctate MCA/ACA watershed infarcts, concerning for embolic source. CTA head and neck  did not show any significant stenosis, there was mild atherosclerotic change at the right carotid bifurcation but no stenosis, right ICA stent in the distal siphon to supraclinoid ICA with no residual aneurysm flow. Overnight  EEG no epileptiform discharges, there was intermittent right hemisphere slowing. Echocardiogram showed EF of 60-65%, HbA1c 6.1, LDL 81. He was discharged home on increased dose of Lipitor, DAPT for 3 weeks followed by Plavix alone, and Levetiracetam 500mg  BID. Exam on hospital discharge indicated mild left arm weakness with ataxia, tremor on right upper extremity (baseline). He was also discharged with a loop recorder due to concern for embolic source.   He and his wife deny any further seizures since 11/14/22. He is tolerating LEV 500mg  BID without significant side effects. His wife denies any staring/unresponsive episodes. He reports he was fully aware during the seizures, no difficulty speaking or comprehending. He denies any olfactory/gustatory hallucinations, deja vu, rising epigastric sensation, focal numbness/tingling/weakness, myoclonic jerks. He denies any headaches, diplopia, dysarthria/dysphagia, neck/back pain, bowel/bladder dysfunction.His wife just recently noticed some twitching of his right upper arm when she held it. He has had a left hand tremor over the past year that does not affect daily activities. Memory is pretty good. His wife has been managing medications for years. He gets 7-9 hours of interrupted sleep, he may take naps during the day. He lives with his wife.   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 He had a normal birth and early development.  There is no history of febrile convulsions, CNS infections such as meningitis/encephalitis, significant traumatic brain injury, neurosurgical procedures, or family history of seizures.   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   GERD (gastroesophageal reflux disease)    H/O cerebral aneurysm repair 2016   "put stent in"    High cholesterol    History of blood transfusion ~ 1950   "while in hospital w/pneumonia" (01/09/2018)   History of kidney stones    Hypertension    Nonruptured cerebral aneurysm    Pneumonia ~ 1950   Red man syndrome    Seizures (HCC) 12/27/2015; 12/28/2015   Stroke (HCC) 12/2015   denies residual on 01/09/2018)    MEDICATIONS: Current Outpatient Medications on File Prior to Visit  Medication Sig Dispense Refill   acetaminophen (TYLENOL) 325 MG tablet Take 1-2 tablets (325-650 mg total) by mouth every 4 (four) hours as needed for mild pain.     amLODipine (NORVASC) 5 MG tablet Take 1 tablet (5 mg total) by mouth daily. 90 tablet 1   Ascorbic Acid (VITAMIN C) 1000 MG tablet Take 1,000 mg by mouth daily.     atorvastatin (LIPITOR) 80 MG tablet Take 1 tablet (80 mg total) by mouth at bedtime. 90 tablet 1   bimatoprost (LUMIGAN) 0.03 % ophthalmic solution Place 1 drop into the right eye at bedtime.     Brinzolamide-Brimonidine (SIMBRINZA) 1-0.2 % SUSP Place 1 drop into the right eye 2 (two) times daily.     calcium carbonate (OSCAL) 1500 (600 Ca) MG TABS tablet Take 600 mg of elemental calcium by mouth daily with breakfast.     Cholecalciferol 125 MCG (5000 UT) TABS Take 5,000 Units by mouth daily.     clopidogrel (PLAVIX) 75 MG tablet Take 1 tablet (75  mg total) by mouth daily. 90 tablet 3   fluticasone (FLONASE) 50 MCG/ACT nasal spray Place 2 sprays into both nostrils daily.     levETIRAcetam (KEPPRA) 500 MG tablet Take 1 tablet (500 mg total) by mouth 2 (two) times daily. 180 tablet 3   Multiple Vitamin (MULTIVITAMIN) capsule Take 1 capsule by mouth daily. 50+     multivitamin-lutein (OCUVITE-LUTEIN) CAPS capsule Take 1 capsule by mouth daily.     prednisoLONE acetate (PRED FORTE) 1 % ophthalmic suspension Place 1 drop into the left eye in the morning.     senna-docusate (SENOKOT-S) 8.6-50 MG tablet Take 2 tablets by mouth at bedtime as needed for mild constipation. (Patient taking  differently: Take 1 tablet by mouth daily.)     tamsulosin (FLOMAX) 0.4 MG CAPS capsule Take 1 capsule (0.4 mg total) by mouth daily after supper. 90 capsule 1   timolol (TIMOPTIC) 0.25 % ophthalmic solution Place 1 drop into the right eye 2 (two) times daily.     TRELEGY ELLIPTA 100-62.5-25 MCG/ACT AEPB Inhale 1 puff into the lungs daily.     vitamin E 400 UNIT capsule Take 400 Units by mouth daily.     No current facility-administered medications on file prior to visit.    ALLERGIES: Allergies  Allergen Reactions   Cephalosporins Rash    Full body rash with systemic symptoms   Vancomycin Rash    Full body rash with systemic symptoms    FAMILY HISTORY: Family History  Problem Relation Age of Onset   Lymphoma Mother    Dementia Mother    Aneurysm Father    Colon cancer Neg Hx     SOCIAL HISTORY: Social History   Socioeconomic History   Marital status: Married    Spouse name: Not on file   Number of children: Not on file   Years of education: Not on file   Highest education level: 12th grade  Occupational History   Not on file  Tobacco Use   Smoking status: Every Day    Current packs/day: 1.00    Average packs/day: 1 pack/day for 61.0 years (61.0 ttl pk-yrs)    Types: Cigarettes   Smokeless tobacco: Never   Tobacco comments:    Smokes 4 packs of cigarettes in a week. 12/05/22 Tay    1/2 ppd per pt 01/10/23  Vaping Use   Vaping status: Never Used  Substance and Sexual Activity   Alcohol use: No    Alcohol/week: 0.0 standard drinks of alcohol   Drug use: No   Sexual activity: Not Currently  Other Topics Concern   Not on file  Social History Narrative   Are you right handed or left handed? Right handed    Are you currently employed ? Retired    What is your current occupation?   Do you live at home alone?no    Who lives with you? wife   What type of home do you live in: 1 story or 2 story?  1 story, ranch home 1 step        Social Drivers of Manufacturing engineer Strain: Low Risk  (01/13/2023)   Overall Financial Resource Strain (CARDIA)    Difficulty of Paying Living Expenses: Not hard at all  Food Insecurity: No Food Insecurity (01/13/2023)   Hunger Vital Sign    Worried About Running Out of Food in the Last Year: Never true    Ran Out of Food in the Last Year: Never true  Transportation Needs: No Transportation Needs (01/13/2023)   PRAPARE - Administrator, Civil Service (Medical): No    Lack of Transportation (Non-Medical): No  Physical Activity: Unknown (01/13/2023)   Exercise Vital Sign    Days of Exercise per Week: Patient declined    Minutes of Exercise per Session: Not on file  Stress: No Stress Concern Present (01/13/2023)   [employer] of Occupational Health - Occupational Stress Questionnaire    Feeling of Stress : Not at all  Social Connections: Socially Integrated (01/13/2023)   Social Connection and Isolation Panel [NHANES]    Frequency of Communication with Friends and Family: More than three times a week    Frequency of Social Gatherings with Friends and Family: Once a week    Attends Religious Services: More than 4 times per year    Active Member of [employer] or Organizations: Yes    Attends Engineer, structural: More than 4 times per year    Marital Status: Married  Catering manager Violence: Not At Risk (11/14/2022)   Humiliation, Afraid, Rape, and Kick questionnaire    Fear of Current or Ex-Partner: No    Emotionally Abused: No    Physically Abused: No    Sexually Abused: No     PHYSICAL EXAM: Vitals:   10/23/23 0958  BP: 120/68  Pulse: 73  SpO2: 98%   General: No acute distress Head:  Normocephalic/atraumatic Skin/Extremities: No rash, no edema Neurological Exam: alert and awake. No aphasia or dysarthria. Fund of knowledge is appropriate.  Attention and concentration are normal.   Cranial nerves: Pupils equal, round. Extraocular movements intact with no nystagmus. Visual  fields full.  No facial asymmetry.  Motor: Bulk and tone normal, no cogwheeling, muscle strength 5/5 throughout with no pronator drift.   Finger to nose testing intact.  Gait favors right leg with cane, no ataxia. Good arm swing on right arm (holding cane with left hand). No resting tremor. There was a brief right hand tremor when he lifted his arm up to demonstrate the tremor, but no endpoint tremor on exam, minimal low amplitude right hand postural tremor.    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 No seizures since 11/14/22 on Levetiracetam 500mg  BID. He is on Plavix for secondary stroke prevention and has an ILR. They are reporting new symptoms of occasional right hand tremor and jerks, no significant tremor in office today. MRI brain with and without contrast will be ordered to assess for underlying structural abnormality. He is aware of [place] driving laws to stop driving after a seizure until 6 months seizure-free. Follow-up in 6 months, call for any changes.    Thank you for allowing me to participate in his care.  Please do not hesitate to call for any questions or concerns.    Patrcia Dolly, M.D.   CC: Dr. Durwin Nora

## 2023-10-23 NOTE — Patient Instructions (Signed)
Good to see you.  Schedule MRI brain with and without contrast at [facility]  2. Continue Levetiracetam 500mg  twice a day  3. Continue Plavix 75mg  daily  4. Follow-up in 6 months, call for any changes   Seizure Precautions: 1. If medication has been prescribed for you to prevent seizures, take it exactly as directed.  Do not stop taking the medicine without talking to your doctor first, even if you have not had a seizure in a long time.   2. Avoid activities in which a seizure would cause danger to yourself or to others.  Don't operate dangerous machinery, swim alone, or climb in high or dangerous places, such as on ladders, roofs, or girders.  Do not drive unless your doctor says you may.  3. If you have any warning that you may have a seizure, lay down in a safe place where you can't hurt yourself.    4.  No driving for 6 months from last seizure, as per [facility].   Please refer to the following link on the [employer]ile[employer] of [employer]'s website for more information: http://www.epilepsyfoundation.org/answerplace/Social/driving/drivingu.cfm   5.  Maintain good sleep hygiene. Avoid alcohol.  6.  Contact your doctor if you have any problems that may be related to the medicine you are taking.  7.  Call 911 and bring the patient back to the ED if:        A.  The seizure lasts longer than 5 minutes.       B.  The patient doesn't awaken shortly after the seizure  C.  The patient has new problems such as difficulty seeing, speaking or moving  D.  The patient was injured during the seizure  E.  The patient has a temperature over 102 F (39C)  F.  The patient vomited and now is having trouble breathing

## 2023-10-27 ENCOUNTER — Other Ambulatory Visit: Payer: Self-pay

## 2023-10-27 ENCOUNTER — Ambulatory Visit (INDEPENDENT_AMBULATORY_CARE_PROVIDER_SITE_OTHER): Payer: Medicare Other

## 2023-10-27 DIAGNOSIS — R5383 Other fatigue: Secondary | ICD-10-CM

## 2023-10-27 DIAGNOSIS — E559 Vitamin D deficiency, unspecified: Secondary | ICD-10-CM

## 2023-10-27 DIAGNOSIS — R7303 Prediabetes: Secondary | ICD-10-CM

## 2023-10-27 DIAGNOSIS — E0789 Other specified disorders of thyroid: Secondary | ICD-10-CM

## 2023-10-27 DIAGNOSIS — E785 Hyperlipidemia, unspecified: Secondary | ICD-10-CM

## 2023-10-27 DIAGNOSIS — I10 Essential (primary) hypertension: Secondary | ICD-10-CM

## 2023-10-27 DIAGNOSIS — I639 Cerebral infarction, unspecified: Secondary | ICD-10-CM | POA: Diagnosis not present

## 2023-10-27 LAB — CUP PACEART REMOTE DEVICE CHECK
Date Time Interrogation Session: 20250202231921
Implantable Pulse Generator Implant Date: 20240223

## 2023-10-28 LAB — CBC WITH DIFFERENTIAL/PLATELET
Basophils Absolute: 0 10*3/uL (ref 0.0–0.2)
Basos: 0 %
EOS (ABSOLUTE): 0.2 10*3/uL (ref 0.0–0.4)
Eos: 2 %
Hematocrit: 42.3 % (ref 37.5–51.0)
Hemoglobin: 14.4 g/dL (ref 13.0–17.7)
Immature Grans (Abs): 0 10*3/uL (ref 0.0–0.1)
Immature Granulocytes: 0 %
Lymphocytes Absolute: 2.8 10*3/uL (ref 0.7–3.1)
Lymphs: 40 %
MCH: 33.6 pg — ABNORMAL HIGH (ref 26.6–33.0)
MCHC: 34 g/dL (ref 31.5–35.7)
MCV: 99 fL — ABNORMAL HIGH (ref 79–97)
Monocytes Absolute: 0.6 10*3/uL (ref 0.1–0.9)
Monocytes: 9 %
Neutrophils Absolute: 3.3 10*3/uL (ref 1.4–7.0)
Neutrophils: 49 %
Platelets: 224 10*3/uL (ref 150–450)
RBC: 4.28 x10E6/uL (ref 4.14–5.80)
RDW: 11.9 % (ref 11.6–15.4)
WBC: 6.9 10*3/uL (ref 3.4–10.8)

## 2023-10-28 LAB — HEMOGLOBIN A1C
Est. average glucose Bld gHb Est-mCnc: 148 mg/dL
Hgb A1c MFr Bld: 6.8 % — ABNORMAL HIGH (ref 4.8–5.6)

## 2023-10-28 LAB — VITAMIN D 25 HYDROXY (VIT D DEFICIENCY, FRACTURES): Vit D, 25-Hydroxy: 54.8 ng/mL (ref 30.0–100.0)

## 2023-10-28 LAB — CMP14+EGFR
ALT: 26 [IU]/L (ref 0–44)
AST: 22 [IU]/L (ref 0–40)
Albumin: 4 g/dL (ref 3.7–4.7)
Alkaline Phosphatase: 133 [IU]/L — ABNORMAL HIGH (ref 44–121)
BUN/Creatinine Ratio: 15 (ref 10–24)
BUN: 15 mg/dL (ref 8–27)
Bilirubin Total: 0.4 mg/dL (ref 0.0–1.2)
CO2: 23 mmol/L (ref 20–29)
Calcium: 9.2 mg/dL (ref 8.6–10.2)
Chloride: 101 mmol/L (ref 96–106)
Creatinine, Ser: 1.02 mg/dL (ref 0.76–1.27)
Globulin, Total: 2.4 g/dL (ref 1.5–4.5)
Glucose: 115 mg/dL — ABNORMAL HIGH (ref 70–99)
Potassium: 4.6 mmol/L (ref 3.5–5.2)
Sodium: 137 mmol/L (ref 134–144)
Total Protein: 6.4 g/dL (ref 6.0–8.5)
eGFR: 73 mL/min/{1.73_m2} (ref >59)

## 2023-10-28 LAB — LIPID PANEL
Chol/HDL Ratio: 3 {ratio} (ref 0.0–5.0)
Cholesterol, Total: 116 mg/dL (ref 100–199)
HDL: 39 mg/dL — ABNORMAL LOW (ref >39)
LDL Chol Calc (NIH): 57 mg/dL (ref 0–99)
Triglycerides: 111 mg/dL (ref 0–149)
VLDL Cholesterol Cal: 20 mg/dL (ref 5–40)

## 2023-10-31 LAB — B12 AND FOLATE PANEL
Folate: >20 ng/mL (ref >3.0)
Vitamin B-12: 896 pg/mL (ref 232–1245)

## 2023-10-31 LAB — TSH+FREE T4
Free T4: 1.05 ng/dL (ref 0.82–1.77)
TSH: 3.18 u[IU]/mL (ref 0.450–4.500)

## 2023-10-31 LAB — SPECIMEN STATUS REPORT

## 2023-11-03 ENCOUNTER — Ambulatory Visit (INDEPENDENT_AMBULATORY_CARE_PROVIDER_SITE_OTHER): Payer: Medicare Other | Admitting: Internal Medicine

## 2023-11-03 ENCOUNTER — Encounter: Payer: Self-pay | Admitting: Internal Medicine

## 2023-11-03 VITALS — BP 128/70 | HR 74 | Ht 69.0 in | Wt 189.6 lb

## 2023-11-03 DIAGNOSIS — Z125 Encounter for screening for malignant neoplasm of prostate: Secondary | ICD-10-CM

## 2023-11-03 DIAGNOSIS — C3491 Malignant neoplasm of unspecified part of right bronchus or lung: Secondary | ICD-10-CM

## 2023-11-03 DIAGNOSIS — J449 Chronic obstructive pulmonary disease, unspecified: Secondary | ICD-10-CM | POA: Diagnosis not present

## 2023-11-03 DIAGNOSIS — I10 Essential (primary) hypertension: Secondary | ICD-10-CM

## 2023-11-03 DIAGNOSIS — E119 Type 2 diabetes mellitus without complications: Secondary | ICD-10-CM | POA: Insufficient documentation

## 2023-11-03 DIAGNOSIS — I63511 Cerebral infarction due to unspecified occlusion or stenosis of right middle cerebral artery: Secondary | ICD-10-CM

## 2023-11-03 DIAGNOSIS — G40909 Epilepsy, unspecified, not intractable, without status epilepticus: Secondary | ICD-10-CM

## 2023-11-03 DIAGNOSIS — I63411 Cerebral infarction due to embolism of right middle cerebral artery: Secondary | ICD-10-CM

## 2023-11-03 DIAGNOSIS — N400 Enlarged prostate without lower urinary tract symptoms: Secondary | ICD-10-CM

## 2023-11-03 DIAGNOSIS — Z0001 Encounter for general adult medical examination with abnormal findings: Secondary | ICD-10-CM | POA: Insufficient documentation

## 2023-11-03 DIAGNOSIS — I6930 Unspecified sequelae of cerebral infarction: Secondary | ICD-10-CM

## 2023-11-03 DIAGNOSIS — E1169 Type 2 diabetes mellitus with other specified complication: Secondary | ICD-10-CM

## 2023-11-03 MED ORDER — ALBUTEROL SULFATE HFA 108 (90 BASE) MCG/ACT IN AERS: 2.0000 | INHALATION_SPRAY | Freq: Four times a day (QID) | RESPIRATORY_TRACT | 3 refills | Status: DC | PRN

## 2023-11-03 MED ORDER — AMLODIPINE BESYLATE 5 MG PO TABS: 5.0000 mg | ORAL_TABLET | Freq: Every day | ORAL | 3 refills | Status: AC

## 2023-11-03 MED ORDER — FINASTERIDE 5 MG PO TABS: 5.0000 mg | ORAL_TABLET | Freq: Every day | ORAL | 3 refills | Status: AC

## 2023-11-03 MED ORDER — ATORVASTATIN CALCIUM 80 MG PO TABS: 80.0000 mg | ORAL_TABLET | Freq: Every day | ORAL | 3 refills | Status: AC

## 2023-11-03 MED ORDER — TAMSULOSIN HCL 0.4 MG PO CAPS: 0.4000 mg | ORAL_CAPSULE | Freq: Every day | ORAL | 3 refills | Status: DC

## 2023-11-03 NOTE — Progress Notes (Signed)
 Complete physical exam  Patient: Damon Parrish   DOB: 05-22-1941   83 y.o. Male  MRN: [id number]  Subjective:    Chief Complaint  Patient presents with   Annual Exam    Damon Parrish is a 83 y.o. male who presents today for a complete physical exam. He reports consuming a general diet. The patient does not participate in regular exercise at present. He generally feels fairly well. He reports sleeping fairly well. He does not have additional problems to discuss today.   Most recent fall risk assessment:    11/03/2023    1:01 PM  Fall Risk   Falls in the past year? 1  Number falls in past yr: 1  Injury with Fall? 0  Risk for fall due to : Impaired balance/gait;Impaired mobility  Follow up Falls evaluation completed;Education provided;Falls prevention discussed   Most recent depression screenings:    11/03/2023    1:01 PM 04/17/2023    2:55 PM  PHQ 2/9 Scores  PHQ - 2 Score 0 0  PHQ- 9 Score  3   Vision:Within last year and Dental: No current dental problems and Receives regular dental care  Patient Active Problem List   Diagnosis Date Noted   Type 2 diabetes mellitus without complication, without long-term current use of insulin  (HCC) 11/03/2023   Encounter for well adult exam with abnormal findings 11/03/2023   Malignant neoplasm of right upper lobe of lung (HCC) 07/21/2023   Cigarette smoker 03/11/2023   NSCLC of right lung (HCC) 12/31/2022   BPH (benign prostatic hyperplasia) 12/31/2022   Tobacco use 12/31/2022   Prediabetes 12/31/2022   Primary open angle glaucoma, both eyes 12/31/2022   COPD GOLD 2 12/31/2022   Encounter for general adult medical examination with abnormal findings 12/31/2022   Lung nodule 12/05/2022   CVA (cerebral vascular accident) (HCC) 11/14/2022   Seizure disorder (HCC) 11/13/2022   Pulmonary nodule 11/13/2022   Cough 11/13/2022   Left leg numbness    Muscle fasciculation    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   Allergic reaction     DRESS syndrome    Pressure injury of skin 01/09/2018   Adverse drug reaction 01/08/2018   Dermatitis, drug-induced 01/08/2018   Discitis 12/17/2017   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   Rectal bleeding    Constipation 12/16/2017   Cerebrovascular accident (CVA) due to embolism of right middle cerebral artery (HCC) 03/06/2016   S/P cerebral aneurysm repair 03/06/2016   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   Palpitations 01/05/2016   Aneurysm, cerebral, nonruptured    Hyperlipidemia    Acute CVA (cerebrovascular accident) (HCC) 12/30/2015   Acute right MCA stroke (HCC)    Focal seizure (HCC) 12/28/2015   HTN (hypertension) 12/28/2015   Hyponatremia 12/28/2015   Cerebral aneurysm 05/04/2015   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   GERD (gastroesophageal reflux disease)    H/O cerebral aneurysm repair 2016   "put stent in"   High cholesterol    History of blood transfusion ~ 1950   "while in hospital w/pneumonia" (01/09/2018)   History of kidney stones    Hypertension    Nonruptured cerebral aneurysm    Pneumonia ~ 1950   Red man syndrome    Seizures (HCC)  12/27/2015; 12/28/2015   Stroke (HCC) 12/2015   denies residual on 01/09/2018)   Past Surgical History:  Procedure Laterality Date   BRONCHIAL BIOPSY  12/17/2022   Procedure: BRONCHIAL BIOPSIES;  Surgeon: Prudy Brownie, DO;  Location: [facility];  Service: Pulmonary;;   BRONCHIAL NEEDLE ASPIRATION BIOPSY  12/17/2022   Procedure: BRONCHIAL NEEDLE ASPIRATION BIOPSIES;  Surgeon: Prudy Brownie, DO;  Location: [facility];  Service: Pulmonary;;   CATARACT EXTRACTION W/ INTRAOCULAR LENS  IMPLANT, BILATERAL Bilateral    COLONOSCOPY     2013 per patient: done in [place], normal, next colonoscopy due in 10 years    EYE SURGERY Left 06/17/2022   EYE SURGERY Right 04/14/2023   FOOT FRACTURE SURGERY Left    "pins, etc. in there"   FRACTURE SURGERY     LOOP RECORDER INSERTION N/A 11/15/2022   Procedure: LOOP RECORDER INSERTION;  Surgeon: Efraim Grange, MD;  Location: [facility];  Service: Cardiovascular;  Laterality: N/A;   RADIOLOGY WITH ANESTHESIA N/A 05/04/2015   Procedure: Pipeline Embolization;  Surgeon: Augusto Blonder, MD;  Location: [facility];  Service: Radiology;  Laterality: N/A;   RADIOLOGY WITH ANESTHESIA N/A 05/31/2015   Procedure: Pipeline Embolization;  Surgeon: Augusto Blonder, MD;  Location: [facility];  Service: Radiology;  Laterality: N/A;   Social History   Tobacco Use   Smoking status: Every Day    Current packs/day: 1.00    Average packs/day: 1 pack/day for 61.0 years (61.0 ttl pk-yrs)    Types: Cigarettes   Smokeless tobacco: Never   Tobacco comments:    Smokes 4 packs of cigarettes in a week. 12/05/22 [place]    1/2 ppd per pt 01/10/23  Vaping Use   Vaping status: Never Used  Substance Use Topics   Alcohol use: No    Alcohol/week: 0.0 standard drinks of alcohol   Drug use: No   Family History  Problem Relation Age of Onset   Lymphoma Mother    Dementia Mother    Aneurysm Father    Colon cancer Neg Hx    Allergies  Allergen Reactions   Cephalosporins Rash    Full body rash with systemic symptoms   Vancomycin  Rash    Full body rash with systemic symptoms   Patient Care Team: Tobi Fortes, MD as PCP - General (Internal Medicine) Jhonny Moss, MD as Consulting Physician (Neurology)   Outpatient Medications Prior to Visit  Medication Sig   Ascorbic Acid  (VITAMIN C ) 1000 MG tablet Take 1,000 mg by mouth daily.   Brinzolamide -Brimonidine  (SIMBRINZA) 1-0.2 % SUSP Place 1 drop into the right eye 2 (two) times daily.   calcium  carbonate (OSCAL) 1500 (600 Ca) MG TABS tablet Take 600 mg of elemental calcium  by mouth daily with breakfast.    Cholecalciferol  125 MCG (5000 UT) TABS Take 5,000 Units by mouth daily.   clopidogrel  (PLAVIX ) 75 MG tablet Take 1 tablet (75 mg total) by mouth daily.   fluticasone  (FLONASE ) 50 MCG/ACT nasal spray Place 2 sprays into both nostrils daily.   levETIRAcetam  (KEPPRA ) 500 MG tablet Take 1 tablet (500 mg total) by mouth 2 (two) times daily.   Multiple Vitamin (MULTIVITAMIN) capsule Take 1 capsule by mouth daily. 50+   multivitamin-lutein  (OCUVITE-LUTEIN ) CAPS capsule Take 1 capsule by mouth daily.   prednisoLONE  acetate (PRED FORTE ) 1 % ophthalmic suspension Place 1 drop into the left eye in the morning.   senna-docusate (SENOKOT-S) 8.6-50 MG tablet Take 2 tablets by mouth at bedtime  as needed for mild constipation. (Patient taking differently: Take 1 tablet by mouth daily.)   vitamin E  400 UNIT capsule Take 400 Units by mouth daily.   [DISCONTINUED] amLODipine  (NORVASC ) 5 MG tablet Take 1 tablet (5 mg total) by mouth daily.   [DISCONTINUED] atorvastatin  (LIPITOR ) 80 MG tablet Take 1 tablet (80 mg total) by mouth at bedtime.   [DISCONTINUED] finasteride  (PROSCAR ) 5 MG tablet Take 5 mg by mouth daily.   [DISCONTINUED] tamsulosin  (FLOMAX ) 0.4 MG CAPS capsule Take 1 capsule (0.4 mg total) by mouth daily after supper.   No facility-administered medications prior to visit.   Review of Systems  Constitutional:  Negative for chills and fever.  HENT:  Negative for sore throat.   Respiratory:  Positive for shortness of breath (at rest and with exertion). Negative for cough.   Cardiovascular:  Negative for chest pain, palpitations and leg swelling.  Gastrointestinal:  Positive for diarrhea (1-2 times per month). Negative for abdominal pain, blood in stool, constipation, nausea and vomiting.  Genitourinary:  Negative for dysuria and hematuria.  Musculoskeletal:  Negative for myalgias.  Skin:  Negative for itching and rash.  Neurological:  Negative for dizziness and headaches.  Psychiatric/Behavioral:   Negative for depression and suicidal ideas.       Objective:     BP 128/70 (BP Location: Left Arm, Patient Position: Sitting, Cuff Size: Normal)   Pulse 74   Ht 5\' 9"  (1.753 m)   Wt 189 lb 9.6 oz (86 kg)   SpO2 98%   BMI 28.00 kg/m  BP Readings from Last 3 Encounters:  11/03/23 128/70  10/23/23 120/68  06/10/23 133/68   Wt Readings from Last 3 Encounters:  11/03/23 189 lb 9.6 oz (86 kg)  10/23/23 192 lb 6.4 oz (87.3 kg)  06/10/23 187 lb (84.8 kg)   Physical Exam Vitals reviewed.  Constitutional:      General: He is not in acute distress.    Appearance: Normal appearance. He is not ill-appearing.  HENT:     Head: Normocephalic and atraumatic.     Right Ear: External ear normal.     Left Ear: External ear normal.     Nose: Nose normal. No congestion or rhinorrhea.     Mouth/Throat:     Mouth: Mucous membranes are moist.     Pharynx: Oropharynx is clear.  Eyes:     General: No scleral icterus.    Extraocular Movements: Extraocular movements intact.     Conjunctiva/sclera: Conjunctivae normal.     Pupils: Pupils are equal, round, and reactive to light.  Cardiovascular:     Rate and Rhythm: Normal rate and regular rhythm.     Pulses: Normal pulses.     Heart sounds: Normal heart sounds. No murmur heard. Pulmonary:     Effort: Pulmonary effort is normal.     Breath sounds: Normal breath sounds. No wheezing, rhonchi or rales.  Abdominal:     General: Abdomen is flat. Bowel sounds are normal. There is no distension.     Palpations: Abdomen is soft.     Tenderness: There is no abdominal tenderness.  Musculoskeletal:        General: No swelling or deformity. Normal range of motion.     Cervical back: Normal range of motion.  Skin:    General: Skin is warm and dry.     Capillary Refill: Capillary refill takes less than 2 seconds.  Neurological:     General: No focal deficit present.  Mental Status: He is alert and oriented to person, place, and time.     Motor: No  weakness.     Comments: 4/5 strength in LUE compared to RUE.  Psychiatric:        Mood and Affect: Mood normal.        Behavior: Behavior normal.        Thought Content: Thought content normal.   Last CBC Lab Results  Component Value Date   WBC 6.9 10/27/2023   HGB 14.4 10/27/2023   HCT 42.3 10/27/2023   MCV 99 (H) 10/27/2023   MCH 33.6 (H) 10/27/2023   RDW 11.9 10/27/2023   PLT 224 10/27/2023   Last metabolic panel Lab Results  Component Value Date   GLUCOSE 115 (H) 10/27/2023   NA 137 10/27/2023   K 4.6 10/27/2023   CL 101 10/27/2023   CO2 23 10/27/2023   BUN 15 10/27/2023   CREATININE 1.02 10/27/2023   EGFR 73 10/27/2023   CALCIUM  9.2 10/27/2023   PROT 6.4 10/27/2023   ALBUMIN 4.0 10/27/2023   LABGLOB 2.4 10/27/2023   BILITOT 0.4 10/27/2023   ALKPHOS 133 (H) 10/27/2023   AST 22 10/27/2023   ALT 26 10/27/2023   ANIONGAP 6 12/02/2022   Last lipids Lab Results  Component Value Date   CHOL 116 10/27/2023   HDL 39 (L) 10/27/2023   LDLCALC 57 10/27/2023   TRIG 111 10/27/2023   CHOLHDL 3.0 10/27/2023   Last hemoglobin A1c Lab Results  Component Value Date   HGBA1C 6.8 (H) 10/27/2023   Last thyroid functions Lab Results  Component Value Date   TSH 3.180 10/27/2023   Last vitamin D  Lab Results  Component Value Date   VD25OH 54.8 10/27/2023   Last vitamin B12 and Folate Lab Results  Component Value Date   VITAMINB12 896 10/27/2023   FOLATE >20.0 10/27/2023      Assessment & Plan:    Routine Health Maintenance and Physical Exam  Immunization History  Administered Date(s) Administered   Fluad Quad(high Dose 65+) 06/18/2023   Influenza-Unspecified 07/08/1997, 08/24/2004, 07/13/2013, 06/23/2014, 07/19/2015, 07/17/2016, 07/02/2017, 07/08/2018, 07/13/2019, 06/20/2020   PFIZER(Purple Top)SARS-COV-2 Vaccination 10/14/2019, 11/04/2019, 06/28/2020   PNEUMOCOCCAL CONJUGATE-20 08/23/2021   Pneumococcal Conjugate-13 05/02/2014   Pneumococcal  Polysaccharide-23 05/18/2015, 08/02/2020   Tdap 04/19/2013    Health Maintenance  Topic Date Due   Medicare Annual Wellness (AWV)  Never done   FOOT EXAM  Never done   OPHTHALMOLOGY EXAM  Never done   Diabetic kidney evaluation - Urine ACR  Never done   Zoster Vaccines- Shingrix (1 of 2) Never done   DTaP/Tdap/Td (2 - Td or Tdap) 04/20/2023   COVID-19 Vaccine (4 - 2024-25 season) 05/25/2023   HEMOGLOBIN A1C  04/25/2024   Diabetic kidney evaluation - eGFR measurement  10/26/2024   Pneumonia Vaccine 80+ Years old  Completed   INFLUENZA VACCINE  Completed   HPV VACCINES  Aged Out   Hepatitis C Screening  Discontinued    Discussed health benefits of physical activity, and encouraged him to engage in regular exercise appropriate for his age and condition.  Problem List Items Addressed This Visit       HTN (hypertension)   Remains adequately controlled on current antihypertensive regimen.  No medication changes are indicated today.      Cerebrovascular accident (CVA) due to embolism of right middle cerebral artery (HCC)   Right MCA CVA in April 2017.  Recently seen by neurology for follow-up.  Remains on atorvastatin   and Plavix .  MRI brain pending.      NSCLC of right lung (HCC)   Completed SBRT.  Seen by radiation oncology for follow-up in October 2024.  Follow-up recommended for 6 months with repeat CT prior to the appointment.      COPD GOLD 2 - Primary   He endorses gradually worsening shortness of breath with exertion.  Known history of COPD.  Will add albuterol  for as needed use.      Type 2 diabetes mellitus without complication, without long-term current use of insulin  (HCC)   New diagnosis.  Meeting criteria with A1c 6.8 on recent labs.  Prefers to focus on dietary control for now, which is reasonable.  Will repeat A1c at follow-up in 6 months.      Seizure disorder (HCC)   Remains on Keppra  500 mg twice daily.  Denies recent seizure activity.      BPH (benign  prostatic hyperplasia)   Symptoms remain adequately controlled with Flomax  and finasteride .  Refills provided today.      Encounter for well adult exam with abnormal findings   Annual physical completed today.  Recent records and labs reviewed. -Medications refilled -Preventative care items are largely up-to-date -Will add PSA screening -Follow-up in 6 months for routine care      Return in about 6 months (around 05/02/2024).  Tobi Fortes, MD

## 2023-11-03 NOTE — Patient Instructions (Signed)
 It was a pleasure to see you today.  Thank you for giving us  the opportunity to be involved in your care.  Below is a brief recap of your visit and next steps.  We will plan to see you again in 6 months.  Summary Annual physical completed today. Medications refilled Check PSA Albuterol  inhaler added for as needed symptom relief Follow up in 6 months

## 2023-11-03 NOTE — Assessment & Plan Note (Signed)
 Symptoms remain adequately controlled with Flomax  and finasteride .  Refills provided today.

## 2023-11-03 NOTE — Assessment & Plan Note (Addendum)
 Right MCA CVA in April 2017.  Recently seen by neurology for follow-up.  Remains on atorvastatin  and Plavix .  MRI brain pending.

## 2023-11-03 NOTE — Assessment & Plan Note (Signed)
 New diagnosis.  Meeting criteria with A1c 6.8 on recent labs.  Prefers to focus on dietary control for now, which is reasonable.  Will repeat A1c at follow-up in 6 months.

## 2023-11-03 NOTE — Assessment & Plan Note (Signed)
 Annual physical completed today.  Recent records and labs reviewed. -Medications refilled -Preventative care items are largely up-to-date -Will add PSA screening -Follow-up in 6 months for routine care

## 2023-11-03 NOTE — Assessment & Plan Note (Signed)
 Remains on Keppra  500 mg twice daily.  Denies recent seizure activity.

## 2023-11-03 NOTE — Assessment & Plan Note (Signed)
 Remains adequately controlled on current antihypertensive regimen.  No medication changes are indicated today.

## 2023-11-03 NOTE — Assessment & Plan Note (Signed)
 Completed SBRT.  Seen by radiation oncology for follow-up in October 2024.  Follow-up recommended for 6 months with repeat CT prior to the appointment.

## 2023-11-03 NOTE — Assessment & Plan Note (Signed)
 He endorses gradually worsening shortness of breath with exertion.  Known history of COPD.  Will add albuterol  for as needed use.

## 2023-11-04 ENCOUNTER — Encounter: Payer: Self-pay | Admitting: Internal Medicine

## 2023-11-04 LAB — PSA: Prostate Specific Ag, Serum: 0.3 ng/mL (ref 0.0–4.0)

## 2023-11-05 ENCOUNTER — Encounter: Payer: Self-pay | Admitting: Cardiovascular Disease

## 2023-11-05 ENCOUNTER — Ambulatory Visit (INDEPENDENT_AMBULATORY_CARE_PROVIDER_SITE_OTHER): Payer: Medicare Other

## 2023-11-05 VITALS — Ht 70.0 in | Wt 189.0 lb

## 2023-11-05 DIAGNOSIS — Z0000 Encounter for general adult medical examination without abnormal findings: Secondary | ICD-10-CM | POA: Diagnosis not present

## 2023-11-05 NOTE — Progress Notes (Signed)
Because this visit was a virtual/telehealth visit,  certain criteria was not obtained, such a blood pressure, CBG if applicable, and timed get up and go. Any medications not marked as "taking" were not mentioned during the medication reconciliation part of the visit. Any vitals not documented were not able to be obtained due to this being a telehealth visit or patient was unable to self-report a recent blood pressure reading due to a lack of equipment at home via telehealth. Vitals that have been documented are verbally provided by the patient.  Interactive audio and video telecommunications were attempted between this provider and patient, however failed, due to patient having technical difficulties OR patient did not have access to video capability.  We continued and completed visit with audio only.  Subjective:   Damon Parrish is a 83 y.o. male who presents for Medicare Annual/Subsequent preventive examination.  Visit Complete: Virtual I connected with  Damon Parrish on 11/05/23 by a audio enabled telemedicine application and verified that I am speaking with the correct person using two identifiers.  Patient Location: Home  Provider Location: Home Office  I discussed the limitations of evaluation and management by telemedicine. The patient expressed understanding and agreed to proceed.  Vital Signs: Because this visit was a virtual/telehealth visit, some criteria may be missing or patient reported. Any vitals not documented were not able to be obtained and vitals that have been documented are patient reported.  Patient Medicare AWV questionnaire was completed by the patient on 11/04/2023; I have confirmed that all information answered by patient is correct and no changes since this date.  Cardiac Risk Factors include: advanced age (>82men, >59 women);diabetes mellitus;dyslipidemia;hypertension;male gender;sedentary lifestyle;smoking/ tobacco exposure;Other (see comment), Risk factor comments:  COPD     Objective:    Today's Vitals   11/05/23 1242  Weight: 189 lb (85.7 kg)  Height: 5\' 10"  (1.778 m)   Body mass index is 27.12 kg/m.     11/05/2023   12:43 PM 10/23/2023   10:09 AM 07/21/2023   11:48 AM 03/31/2023   10:54 AM 01/15/2023    2:49 PM 12/23/2022    1:20 PM 12/17/2022   10:24 AM  Advanced Directives  Does Patient Have a Medical Advance Directive? Yes Yes Yes Yes Yes Yes Yes  Type of Estate agent of [place];Living will Healthcare Power of [employer] of [place];Living will Healthcare Power of [place];Living will Healthcare Power of [place];Living will Healthcare Power of [place];Living will  Copy of Healthcare Power of Attorney in Chart? No - copy requested    No - copy requested No - copy requested No - copy requested  Would patient like information on creating a medical advance directive?     No - Patient declined      Current Medications (verified) Outpatient Encounter Medications as of 11/05/2023  Medication Sig   albuterol (VENTOLIN HFA) 108 (90 Base) MCG/ACT inhaler Inhale 2 puffs into the lungs every 6 (six) hours as needed for wheezing or shortness of breath.   amLODipine (NORVASC) 5 MG tablet Take 1 tablet (5 mg total) by mouth daily.   Ascorbic Acid (VITAMIN C) 1000 MG tablet Take 1,000 mg by mouth daily.   atorvastatin (LIPITOR) 80 MG tablet Take 1 tablet (80 mg total) by mouth at bedtime.   Brinzolamide-Brimonidine (SIMBRINZA) 1-0.2 % SUSP Place 1 drop into the right eye 2 (two) times daily.   calcium carbonate (OSCAL) 1500 (600 Ca) MG TABS tablet Take 600 mg of elemental  calcium by mouth daily with breakfast.   Cholecalciferol 125 MCG (5000 UT) TABS Take 5,000 Units by mouth daily.   clopidogrel (PLAVIX) 75 MG tablet Take 1 tablet (75 mg total) by mouth daily.   finasteride (PROSCAR) 5 MG tablet Take 1 tablet (5 mg total) by mouth daily.   fluticasone (FLONASE) 50 MCG/ACT nasal spray Place 2 sprays into both  nostrils daily.   levETIRAcetam (KEPPRA) 500 MG tablet Take 1 tablet (500 mg total) by mouth 2 (two) times daily.   Multiple Vitamin (MULTIVITAMIN) capsule Take 1 capsule by mouth daily. 50+   multivitamin-lutein (OCUVITE-LUTEIN) CAPS capsule Take 1 capsule by mouth daily.   prednisoLONE acetate (PRED FORTE) 1 % ophthalmic suspension Place 1 drop into the left eye in the morning.   senna-docusate (SENOKOT-S) 8.6-50 MG tablet Take 2 tablets by mouth at bedtime as needed for mild constipation. (Patient taking differently: Take 1 tablet by mouth daily.)   tamsulosin (FLOMAX) 0.4 MG CAPS capsule Take 1 capsule (0.4 mg total) by mouth daily after supper.   vitamin E 400 UNIT capsule Take 400 Units by mouth daily.   No facility-administered encounter medications on file as of 11/05/2023.    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   GERD (gastroesophageal reflux disease)    Glaucoma 1980   H/O cerebral aneurysm repair 2016   "put stent in"   High cholesterol    History of blood transfusion ~ 1950   "while in hospital w/pneumonia" (01/09/2018)   History of kidney stones    Hypertension    Nonruptured cerebral aneurysm    Pneumonia ~ 1950   Red man syndrome    Seizures (HCC) 12/27/2015; 12/28/2015   Stroke (HCC) 12/2015   denies residual on 01/09/2018)   Past Surgical History:  Procedure Laterality Date   BRAIN SURGERY     BRONCHIAL BIOPSY  12/17/2022   Procedure: BRONCHIAL BIOPSIES;  Surgeon: Josephine Igo, DO;  Location: [facility];  Service: Pulmonary;;   BRONCHIAL NEEDLE ASPIRATION BIOPSY  12/17/2022   Procedure: BRONCHIAL NEEDLE ASPIRATION BIOPSIES;  Surgeon: Josephine Igo, DO;   Location: [facility];  Service: Pulmonary;;   CATARACT EXTRACTION W/ INTRAOCULAR LENS  IMPLANT, BILATERAL Bilateral    COLONOSCOPY     2013 per patient: done in [place], normal, next colonoscopy due in 10 years   EYE SURGERY Left 06/17/2022   EYE SURGERY Right 04/14/2023   FOOT FRACTURE SURGERY Left    "pins, etc. in there"   FRACTURE SURGERY     LOOP RECORDER INSERTION N/A 11/15/2022   Procedure: LOOP RECORDER INSERTION;  Surgeon: Maurice Small, MD;  Location: [facility];  Service: Cardiovascular;  Laterality: N/A;   RADIOLOGY WITH ANESTHESIA N/A 05/04/2015   Procedure: Pipeline Embolization;  Surgeon: Lisbeth Renshaw, MD;  Location: [facility]UR[facility];  Service: Radiology;  Laterality: N/A;   RADIOLOGY WITH ANESTHESIA N/A 05/31/2015   Procedure: Pipeline Embolization;  Surgeon: Lisbeth Renshaw, MD;  [facility];  Service: Radiology;  Laterality: N/A;   Family History  Problem Relation Age of Onset   Lymphoma Mother    Dementia Mother  Cancer Mother    Stroke Mother    Aneurysm Father    Colon cancer Neg Hx    Social History   Socioeconomic History   Marital status: Married    Spouse name: Not on file   Number of children: Not on file   Years of education: Not on file   Highest education level: 12th grade  Occupational History   Not on file  Tobacco Use   Smoking status: Every Day    Average packs/day: 1 pack/day for 61.0 years (61.0 ttl pk-yrs)    Types: Cigarettes    Start date: 22   Smokeless tobacco: Never   Tobacco comments:    Smokes 4 packs of cigarettes in a week. 12/05/22 Tay    1/2 ppd per pt 01/10/23  Vaping Use   Vaping status: Never Used  Substance and Sexual Activity   Alcohol use: No   Drug use: No   Sexual activity: Not Currently  Other Topics Concern   Not on file  Social History Narrative   Are you right handed or left handed? Right handed    Are you currently employed ? Retired    What is your current occupation?   Do  you live at home alone?no    Who lives with you? wife   What type of home do you live in: 1 story or 2 story?  1 story, ranch home 1 step        Social Drivers of Corporate investment banker Strain: Low Risk  (11/05/2023)   Overall Financial Resource Strain (CARDIA)    Difficulty of Paying Living Expenses: Not hard at all  Food Insecurity: No Food Insecurity (11/05/2023)   Hunger Vital Sign    Worried About Running Out of Food in the Last Year: Never true    Ran Out of Food in the Last Year: Never true  Transportation Needs: No Transportation Needs (11/05/2023)   PRAPARE - Administrator, Civil Service (Medical): No    Lack of Transportation (Non-Medical): No  Physical Activity: Patient Declined (11/05/2023)   Exercise Vital Sign    Days of Exercise per Week: Patient declined    Minutes of Exercise per Session: Patient declined  Stress: No Stress Concern Present (11/05/2023)   [employer] of Occupational Health - Occupational Stress Questionnaire    Feeling of Stress : Not at all  Social Connections: Socially Integrated (11/05/2023)   Social Connection and Isolation Panel [NHANES]    Frequency of Communication with Friends and Family: More than three times a week    Frequency of Social Gatherings with Friends and Family: More than three times a week    Attends Religious Services: More than 4 times per year    Active Member of [employer] or Organizations: Yes    Attends Engineer, structural: More than 4 times per year    Marital Status: Married    Tobacco Counseling Ready to quit: No Counseling given: Yes Tobacco comments: Smokes 4 packs of cigarettes in a week. 12/05/22 Tay 1/2 ppd per pt 01/10/23   Clinical Intake:  Pre-visit preparation completed: Yes  Pain : No/denies pain     BMI - recorded: 28 Nutritional Status: BMI 25 -29 Overweight Nutritional Risks: None Diabetes: No  How often do you need to have someone help you when you read  instructions, pamphlets, or other written materials from your doctor or pharmacy?: 1 - Never  Interpreter Needed?: No  Information entered by ::  Abby Jamie Belger, CMA   Activities of Daily Living    11/04/2023   11:35 AM 11/15/2022    9:21 PM  In your present state of health, do you have any difficulty performing the following activities:  Hearing? 1   Vision? 1   Difficulty concentrating or making decisions? 0   Walking or climbing stairs? 1   Dressing or bathing? 0   Doing errands, shopping? 1 1  Preparing Food and eating ? N   Using the Toilet? N   In the past six months, have you accidently leaked urine? N   Do you have problems with loss of bowel control? N   Managing your Medications? Y   Managing your Finances? N   Housekeeping or managing your Housekeeping? Y     Patient Care Team: Billie Lade, MD as PCP - General (Internal Medicine) Van Clines, MD as Consulting Physician (Neurology)  Indicate any recent Medical Services you may have received from other than [facility] providers in the past year (date may be approximate).     Assessment:   This is a routine wellness examination for Damon Parrish.  Hearing/Vision screen Hearing Screening - Comments:: Patient wears hearing aids. He is up to date with yearly exams.  Vision Screening - Comments:: Wears rx glasses - up to date with routine eye exams  Has glaucoma. [facility] doctor [facility]    Goals Addressed             This Visit's Progress    Patient Stated       To be more active       Depression Screen    11/05/2023   12:43 PM 11/03/2023    1:01 PM 04/17/2023    2:55 PM 01/16/2023    2:52 PM 12/31/2022   11:11 AM 12/25/2022    2:11 PM  PHQ 2/9 Scores  PHQ - 2 Score 0 0 0 0 0 0  PHQ- 9 Score   3 4 2 4     Fall Risk    11/04/2023   11:35 AM 11/03/2023    1:01 PM 10/23/2023   10:09 AM 04/17/2023    2:55 PM 03/31/2023   10:53 AM  Fall Risk   Falls in the past year? 1 1 1 1 1   Number falls in past yr: 1 1 1 1  0   Injury with Fall? 1 0 0 1 1  Risk for fall due to : History of fall(s);Impaired balance/gait;Impaired mobility;Orthopedic patient Impaired balance/gait;Impaired mobility  Impaired balance/gait;Impaired mobility   Follow up Education provided;Falls prevention discussed Falls evaluation completed;Education provided;Falls prevention discussed Falls evaluation completed Falls evaluation completed Falls evaluation completed    MEDICARE RISK AT HOME: Medicare Risk at Home Any stairs in or around the home?: (Patient-Rptd) No If so, are there any without handrails?: No Home free of loose throw rugs in walkways, pet beds, electrical cords, etc?: (Patient-Rptd) Yes Adequate lighting in your home to reduce risk of falls?: (Patient-Rptd) Yes Life alert?: (Patient-Rptd) No Use of a cane, walker or w/c?: (Patient-Rptd) Yes Grab bars in the bathroom?: (Patient-Rptd) No Shower chair or bench in shower?: (Patient-Rptd) Yes Elevated toilet seat or a handicapped toilet?: (Patient-Rptd) No  TIMED UP AND GO:  Was the test performed?  No    Cognitive Function:        11/05/2023   12:43 PM  6CIT Screen  What Year? 0 points  What month? 0 points  What time? 0 points  Count back from  20 0 points  Months in reverse 0 points  Repeat phrase 0 points  Total Score 0 points    Immunizations Immunization History  Administered Date(s) Administered   Fluad Quad(high Dose 65+) 06/18/2023   Influenza-Unspecified 07/08/1997, 08/24/2004, 07/13/2013, 06/23/2014, 07/19/2015, 07/17/2016, 07/02/2017, 07/08/2018, 07/13/2019, 06/20/2020   PFIZER(Purple Top)SARS-COV-2 Vaccination 10/14/2019, 11/04/2019, 06/28/2020   PNEUMOCOCCAL CONJUGATE-20 08/23/2021   Pneumococcal Conjugate-13 05/02/2014   Pneumococcal Polysaccharide-23 05/18/2015, 08/02/2020   Tdap 04/19/2013    TDAP status: Due, Education has been provided regarding the importance of this vaccine. Advised may receive this vaccine at local pharmacy or  Health Dept. Aware to provide a copy of the vaccination record if obtained from local pharmacy or Health Dept. Verbalized acceptance and understanding.  Flu Vaccine status: Up to date  Pneumococcal vaccine status: Up to date  Covid-19 vaccine status: Information provided on how to obtain vaccines.   Qualifies for Shingles Vaccine? Yes   Zostavax completed No   Shingrix Completed?: No.    Education has been provided regarding the importance of this vaccine. Patient has been advised to call insurance company to determine out of pocket expense if they have not yet received this vaccine. Advised may also receive vaccine at local pharmacy or Health Dept. Verbalized acceptance and understanding.  Screening Tests Health Maintenance  Topic Date Due   Medicare Annual Wellness (AWV)  Never done   FOOT EXAM  Never done   OPHTHALMOLOGY EXAM  Never done   Diabetic kidney evaluation - Urine ACR  Never done   Zoster Vaccines- Shingrix (1 of 2) Never done   DTaP/Tdap/Td (2 - Td or Tdap) 04/20/2023   COVID-19 Vaccine (4 - 2024-25 season) 05/25/2023   HEMOGLOBIN A1C  04/25/2024   Diabetic kidney evaluation - eGFR measurement  10/26/2024   Pneumonia Vaccine 26+ Years old  Completed   INFLUENZA VACCINE  Completed   HPV VACCINES  Aged Out   Hepatitis C Screening  Discontinued    Health Maintenance  Health Maintenance Due  Topic Date Due   Medicare Annual Wellness (AWV)  Never done   FOOT EXAM  Never done   OPHTHALMOLOGY EXAM  Never done   Diabetic kidney evaluation - Urine ACR  Never done   Zoster Vaccines- Shingrix (1 of 2) Never done   DTaP/Tdap/Td (2 - Td or Tdap) 04/20/2023   COVID-19 Vaccine (4 - 2024-25 season) 05/25/2023    Colorectal cancer screening: No longer required.   Lung Cancer Screening: (Low Dose CT Chest recommended if Age 51-80 years, 20 pack-year currently smoking OR have quit w/in 15years.) does qualify.   Lung Cancer Screening Referral: scheduled for screening  March  18,2025  Additional Screening:  Hepatitis C Screening: does not qualify; Completed   Vision Screening: Recommended annual ophthalmology exams for early detection of glaucoma and other disorders of the eye. Is the patient up to date with their annual eye exam?  Yes  Who is the provider or what is the name of the office in which the patient attends annual eye exams? [facility]  Dental Screening: Recommended annual dental exams for proper oral hygiene  Diabetic Foot Exam: Diabetic Foot Exam: Overdue, Pt has been advised about the importance in completing this exam. Pt is scheduled for diabetic foot exam on provider made aware.  Community Resource Referral / Chronic Care Management: CRR required this visit?  No   CCM required this visit?  No     Plan:     I have personally reviewed and noted  the following in the patient's chart:   Medical and social history Use of alcohol, tobacco or illicit drugs  Current medications and supplements including opioid prescriptions. Patient is not currently taking opioid prescriptions. Functional ability and status Nutritional status Physical activity Advanced directives List of other physicians Hospitalizations, surgeries, and ER visits in previous 12 months Vitals Screenings to include cognitive, depression, and falls Referrals and appointments  In addition, I have reviewed and discussed with patient certain preventive protocols, quality metrics, and best practice recommendations. A written personalized care plan for preventive services as well as general preventive health recommendations were provided to patient.     Jordan Hawks Laveta Gilkey, CMA   11/05/2023   After Visit Summary: (MyChart) Due to this being a telephonic visit, the after visit summary with patients personalized plan was offered to patient via MyChart   Nurse Notes: see routing comment

## 2023-11-05 NOTE — Patient Instructions (Signed)
Damon Parrish , Thank you for taking time to come for your Medicare Wellness Visit. I appreciate your ongoing commitment to your health goals. Please review the following plan we discussed and let me know if I can assist you in the future.   Referrals/Orders/Follow-Ups/Clinician Recommendations:  Next Medicare Annual Wellness Visit: November 09, 2024 at 8:40 am telephone visit.   This is a list of the screening recommended for you and due dates:  Health Maintenance  Topic Date Due   Complete foot exam   Never done   Eye exam for diabetics  Never done   Yearly kidney health urinalysis for diabetes  Never done   Zoster (Shingles) Vaccine (1 of 2) Never done   DTaP/Tdap/Td vaccine (2 - Td or Tdap) 04/20/2023   COVID-19 Vaccine (4 - 2024-25 season) 05/25/2023   Hemoglobin A1C  04/25/2024   Screening for Lung Cancer  07/08/2024   Yearly kidney function blood test for diabetes  10/26/2024   Medicare Annual Wellness Visit  11/04/2024   Pneumonia Vaccine  Completed   Flu Shot  Completed   HPV Vaccine  Aged Out   Hepatitis C Screening  Discontinued    Advanced directives: (Copy Requested) Please bring a copy of your health care power of attorney and living will to the office to be added to your chart at your convenience.  Next Medicare Annual Wellness Visit scheduled for next year: yes  Preventive Care 22 Years and Older, Male Preventive care refers to lifestyle choices and visits with your health care provider that can promote health and wellness. Preventive care visits are also called wellness exams. What can I expect for my preventive care visit? Counseling During your preventive care visit, your health care provider may ask about your: Medical history, including: Past medical problems. Family medical history. History of falls. Current health, including: Emotional well-being. Home life and relationship well-being. Sexual activity. Memory and ability to understand  (cognition). Lifestyle, including: Alcohol, nicotine or tobacco, and drug use. Access to firearms. Diet, exercise, and sleep habits. Work and work Astronomer. Sunscreen use. Safety issues such as seatbelt and bike helmet use. Physical exam Your health care provider will check your: Height and weight. These may be used to calculate your BMI (body mass index). BMI is a measurement that tells if you are at a healthy weight. Waist circumference. This measures the distance around your waistline. This measurement also tells if you are at a healthy weight and may help predict your risk of certain diseases, such as type 2 diabetes and high blood pressure. Heart rate and blood pressure. Body temperature. Skin for abnormal spots. What immunizations do I need?  Vaccines are usually given at various ages, according to a schedule. Your health care provider will recommend vaccines for you based on your age, medical history, and lifestyle or other factors, such as travel or where you work. What tests do I need? Screening Your health care provider may recommend screening tests for certain conditions. This may include: Lipid and cholesterol levels. Diabetes screening. This is done by checking your blood sugar (glucose) after you have not eaten for a while (fasting). Hepatitis C test. Hepatitis B test. HIV (human immunodeficiency virus) test. STI (sexually transmitted infection) testing, if you are at risk. Lung cancer screening. Colorectal cancer screening. Prostate cancer screening. Abdominal aortic aneurysm (AAA) screening. You may need this if you are a current or former smoker. Talk with your health care provider about your test results, treatment options, and if necessary,  the need for more tests. Follow these instructions at home: Eating and drinking  Eat a diet that includes fresh fruits and vegetables, whole grains, lean protein, and low-fat dairy products. Limit your intake of foods with  high amounts of sugar, saturated fats, and salt. Take vitamin and mineral supplements as recommended by your health care provider. Do not drink alcohol if your health care provider tells you not to drink. If you drink alcohol: Limit how much you have to 0-2 drinks a day. Know how much alcohol is in your drink. In the [place] one drink equals one 12 oz bottle of beer (355 mL), one 5 oz glass of wine (148 mL), or one 1 oz glass of hard liquor (44 mL). Lifestyle Brush your teeth every morning and night with fluoride toothpaste. Floss one time each day. Exercise for at least 30 minutes 5 or more days each week. Do not use any products that contain nicotine or tobacco. These products include cigarettes, chewing tobacco, and vaping devices, such as e-cigarettes. If you need help quitting, ask your health care provider. Do not use drugs. If you are sexually active, practice safe sex. Use a condom or other form of protection to prevent STIs. Take aspirin only as told by your health care provider. Make sure that you understand how much to take and what form to take. Work with your health care provider to find out whether it is safe and beneficial for you to take aspirin daily. Ask your health care provider if you need to take a cholesterol-lowering medicine (statin). Find healthy ways to manage stress, such as: Meditation, yoga, or listening to music. Journaling. Talking to a trusted person. Spending time with friends and family. Safety Always wear your seat belt while driving or riding in a vehicle. Do not drive: If you have been drinking alcohol. Do not ride with someone who has been drinking. When you are tired or distracted. While texting. If you have been using any mind-altering substances or drugs. Wear a helmet and other protective equipment during sports activities. If you have firearms in your house, make sure you follow all gun safety procedures. Minimize exposure to UV radiation to  reduce your risk of skin cancer. What's next? Visit your health care provider once a year for an annual wellness visit. Ask your health care provider how often you should have your eyes and teeth checked. Stay up to date on all vaccines. This information is not intended to replace advice given to you by your health care provider. Make sure you discuss any questions you have with your health care provider. Document Revised: 03/07/2021 Document Reviewed: 03/07/2021 Elsevier Patient Education  2024 Else[employer] Inc.  Steps to Quit Smoking Smoking tobacco is the leading cause of preventable death. It can affect almost every organ in the body. Smoking puts you and people around you at risk for many serious, long-lasting (chronic) diseases. Quitting smoking can be hard, but it is one of the best things that you can do for your health. It is never too late to quit. Do not give up if you cannot quit the first time. Some people need to try many times to quit. Do your best to stick to your quit plan, and talk with your doctor if you have any questions or concerns. How do I get ready to quit? Pick a date to quit. Set a date within the next 2 weeks to give you time to prepare. Write down the reasons why you are quitting.  Keep this list in places where you will see it often. Tell your family, friends, and co-workers that you are quitting. Their support is important. Talk with your doctor about the choices that may help you quit. Find out if your health insurance will pay for these treatments. Know the people, places, things, and activities that make you want to smoke (triggers). Avoid them. What first steps can I take to quit smoking? Throw away all cigarettes at home, at work, and in your car. Throw away the things that you use when you smoke, such as ashtrays and lighters. Clean your car. Empty the ashtray. Clean your home, including curtains and carpets. What can I do to help me quit smoking? Talk with  your doctor about taking medicines and seeing a counselor. You are more likely to succeed when you do both. If you are pregnant or breastfeeding: Talk with your doctor about counseling or other ways to quit smoking. Do not take medicine to help you quit smoking unless your doctor tells you to. Quit right away Quit smoking completely, instead of slowly cutting back on how much you smoke over a period of time. Stopping smoking right away may be more successful than slowly quitting. Go to counseling. In-person is best if this is an option. You are more likely to quit if you go to counseling sessions regularly. Take medicine You may take medicines to help you quit. Some medicines need a prescription, and some you can buy over-the-counter. Some medicines may contain a drug called nicotine to replace the nicotine in cigarettes. Medicines may: Help you stop having the desire to smoke (cravings). Help to stop the problems that come when you stop smoking (withdrawal symptoms). Your doctor may ask you to use: Nicotine patches, gum, or lozenges. Nicotine inhalers or sprays. Non-nicotine medicine that you take by mouth. Find resources Find resources and other ways to help you quit smoking and remain smoke-free after you quit. They include: Online chats with a Veterinary surgeon. Phone quitlines. Printed Materials engineer. Support groups or group counseling. Text messaging programs. Mobile phone apps. Use apps on your mobile phone or tablet that can help you stick to your quit plan. Examples of free services include Quit Guide from the CDC and smokefree.gov  What can I do to make it easier to quit?  Talk to your family and friends. Ask them to support and encourage you. Call a phone quitline, such as 1-800-QUIT-NOW, reach out to support groups, or work with a Veterinary surgeon. Ask people who smoke to not smoke around you. Avoid places that make you want to smoke, such as: Bars. Parties. Smoke-break areas at  work. Spend time with people who do not smoke. Lower the stress in your life. Stress can make you want to smoke. Try these things to lower stress: Getting regular exercise. Doing deep-breathing exercises. Doing yoga. Meditating. What benefits will I see if I quit smoking? Over time, you may have: A better sense of smell and taste. Less coughing and sore throat. A slower heart rate. Lower blood pressure. Clearer skin. Better breathing. Fewer sick days. Summary Quitting smoking can be hard, but it is one of the best things that you can do for your health. Do not give up if you cannot quit the first time. Some people need to try many times to quit. When you decide to quit smoking, make a plan to help you succeed. Quit smoking right away, not slowly over a period of time. When you start quitting, get help  and support to keep you smoke-free. This information is not intended to replace advice given to you by your health care provider. Make sure you discuss any questions you have with your health care provider. Document Revised: 08/31/2021 Document Reviewed: 08/31/2021 Elsevier Patient Education  2024 [employer].  Understanding Your Risk for Falls Millions of people have serious injuries from falls each year. It is important to understand your risk of falling. Talk with your health care provider about your risk and what you can do to lower it. If you do have a serious fall, make sure to tell your provider. Falling once raises your risk of falling again. How can falls affect me? Serious injuries from falls are common. These include: Broken bones, such as hip fractures. Head injuries, such as traumatic brain injuries (TBI) or concussions. A fear of falling can cause you to avoid activities and stay at home. This can make your muscles weaker and raise your risk for a fall. What can increase my risk? There are a number of risk factors that increase your risk for falling. The more risk  factors you have, the higher your risk of falling. Serious injuries from a fall happen most often to people who are older than 83 years old. Teenagers and young adults ages 53-29 are also at higher risk. Common risk factors include: Weakness in the lower body. Being generally weak or confused due to long-term (chronic) illness. Dizziness or balance problems. Poor vision. Medicines that cause dizziness or drowsiness. These may include: Medicines for your blood pressure, heart, anxiety, insomnia, or swelling (edema). Pain medicines. Muscle relaxants. Other risk factors include: Drinking alcohol. Having had a fall in the past. Having foot pain or wearing improper footwear. Working at a dangerous job. Having any of the following in your home: Tripping hazards, such as floor clutter or loose rugs. Poor lighting. Pets. Having dementia or memory loss. What actions can I take to lower my risk of falling?     Physical activity Stay physically fit. Do strength and balance exercises. Consider taking a regular class to build strength and balance. Yoga and tai chi are good options. Vision Have your eyes checked every year and your prescription for glasses or contacts updated as needed. Shoes and walking aids Wear non-skid shoes. Wear shoes that have rubber soles and low heels. Do not wear high heels. Do not walk around the house in socks or slippers. Use a cane or walker as told by your provider. Home safety Attach secure railings on both sides of your stairs. Install grab bars for your bathtub, shower, and toilet. Use a non-skid mat in your bathtub or shower. Attach bath mats securely with double-sided, non-slip rug tape. Use good lighting in all rooms. Keep a flashlight near your bed. Make sure there is a clear path from your bed to the bathroom. Use night-lights. Do not use throw rugs. Make sure all carpeting is taped or tacked down securely. Remove all clutter from walkways and  stairways, including extension cords. Repair uneven or broken steps and floors. Avoid walking on icy or slippery surfaces. Walk on the grass instead of on icy or slick sidewalks. Use ice melter to get rid of ice on walkways in the winter. Use a cordless phone. Questions to ask your health care provider Can you help me check my risk for a fall? Do any of my medicines make me more likely to fall? Should I take a vitamin D supplement? What exercises can I do to improve  my strength and balance? Should I make an appointment to have my vision checked? Do I need a bone density test to check for weak bones (osteoporosis)? Would it help to use a cane or a walker? Where to find more information Centers for Disease Control and Prevention, STEADI: TonerPromos.no Community-Based Fall Prevention Programs: TonerPromos.no [employer] on Aging: BaseRingTones.pl Contact a health care provider if: You fall at home. You are afraid of falling at home. You feel weak, drowsy, or dizzy. This information is not intended to replace advice given to you by your health care provider. Make sure you discuss any questions you have with your health care provider. Document Revised: 05/13/2022 Document Reviewed: 05/13/2022 Elsevier Patient Education  2024 [employer].

## 2023-11-12 ENCOUNTER — Ambulatory Visit (HOSPITAL_COMMUNITY): Payer: Medicare Other

## 2023-11-18 ENCOUNTER — Ambulatory Visit (HOSPITAL_COMMUNITY)
Admission: RE | Admit: 2023-11-18 | Discharge: 2023-11-18 | Disposition: A | Discharge status: Home / Self Care | Payer: Medicare Other | Source: Ambulatory Visit | Attending: Neurology | Admitting: Neurology

## 2023-11-18 DIAGNOSIS — Z8673 Personal history of transient ischemic attack (TIA), and cerebral infarction without residual deficits: Secondary | ICD-10-CM | POA: Diagnosis present

## 2023-11-18 DIAGNOSIS — R251 Tremor, unspecified: Secondary | ICD-10-CM | POA: Diagnosis present

## 2023-11-18 MED ORDER — GADOBUTROL 1 MMOL/ML IV SOLN
7.0000 mL | Freq: Once | INTRAVENOUS | Status: AC | PRN
  Administered 2023-11-18: 7 mL via INTRAVENOUS

## 2023-11-18 MED ORDER — GADOBUTROL 1 MMOL/ML IV SOLN
10.0000 mL | Freq: Once | INTRAVENOUS | Status: AC | PRN
Start: 2023-11-18 — End: 2023-11-18
  Administered 2023-11-18: 10 mL via INTRAVENOUS

## 2023-11-19 ENCOUNTER — Telehealth: Payer: Self-pay

## 2023-11-19 NOTE — Telephone Encounter (Signed)
 Call back received from Pt's wife.  Pt was having an MRI at [facility] at this time.  No action needed.

## 2023-11-19 NOTE — Telephone Encounter (Signed)
 Left message requesting call back.  Alert received from [employer] for episode of tachycardia.  Appears that Pt may have had some interference.  Will discuss with Pt what he was doing at time of recording.

## 2023-12-01 ENCOUNTER — Ambulatory Visit (INDEPENDENT_AMBULATORY_CARE_PROVIDER_SITE_OTHER): Payer: Medicare Other

## 2023-12-01 DIAGNOSIS — I639 Cerebral infarction, unspecified: Secondary | ICD-10-CM

## 2023-12-02 LAB — CUP PACEART REMOTE DEVICE CHECK
Date Time Interrogation Session: 20250309231847
Implantable Pulse Generator Implant Date: 20240223

## 2023-12-03 NOTE — Progress Notes (Signed)
 [facility] Summary Report / Loop Recorder

## 2023-12-09 ENCOUNTER — Encounter: Payer: Self-pay | Admitting: Cardiovascular Disease

## 2023-12-09 ENCOUNTER — Ambulatory Visit (HOSPITAL_COMMUNITY)
Admission: RE | Admit: 2023-12-09 | Discharge: 2023-12-09 | Disposition: A | Discharge status: Home / Self Care | Payer: Medicare Other | Source: Ambulatory Visit | Attending: Radiation Oncology | Admitting: Radiation Oncology

## 2023-12-09 DIAGNOSIS — C3411 Malignant neoplasm of upper lobe, right bronchus or lung: Secondary | ICD-10-CM | POA: Insufficient documentation

## 2023-12-09 MED ORDER — IOHEXOL 300 MG/ML  SOLN
75.0000 mL | Freq: Once | INTRAMUSCULAR | Status: AC | PRN
  Administered 2023-12-09: 75 mL via INTRAVENOUS

## 2023-12-23 NOTE — Addendum Note (Signed)
 Encounter addended by: Ronny Bacon, PA-C on: 12/23/2023 3:46 PM  Actions taken: Clinical Note Signed

## 2023-12-24 ENCOUNTER — Telehealth: Payer: Self-pay | Admitting: Radiation Oncology

## 2023-12-24 DIAGNOSIS — C3411 Malignant neoplasm of upper lobe, right bronchus or lung: Secondary | ICD-10-CM

## 2023-12-24 NOTE — Telephone Encounter (Signed)
error 

## 2023-12-24 NOTE — Telephone Encounter (Signed)
 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 I recommended a repeat CT in 6 months and to move the patient's appt on 01/26/24 to the time his next scan will be performed. I encouraged him to call back if he had questions or concerns.

## 2024-01-05 ENCOUNTER — Ambulatory Visit (INDEPENDENT_AMBULATORY_CARE_PROVIDER_SITE_OTHER): Payer: Medicare Other

## 2024-01-05 DIAGNOSIS — I639 Cerebral infarction, unspecified: Secondary | ICD-10-CM

## 2024-01-05 NOTE — Progress Notes (Signed)
 [facility] Summary Report / Loop Recorder

## 2024-01-05 NOTE — Addendum Note (Signed)
 Addended by: Edra Govern D on: 01/05/2024 10:50 AM   Modules accepted: Orders

## 2024-01-06 LAB — CUP PACEART REMOTE DEVICE CHECK
Date Time Interrogation Session: 20250413231827
Implantable Pulse Generator Implant Date: 20240223

## 2024-01-08 ENCOUNTER — Telehealth: Payer: Self-pay

## 2024-01-08 NOTE — Telephone Encounter (Signed)
-----   Message from Damon Parrish sent at 01/08/2024  6:46 AM EDT ----- Pls let them know brain MRI did not show any new changes, no new stroke, no tumor or bleed. Continue all medications, thanks

## 2024-01-08 NOTE — Telephone Encounter (Signed)
 Pt called informed that brain MRI did not show any new changes, no new stroke, no tumor or bleed. Continue all medications

## 2024-01-20 ENCOUNTER — Encounter: Payer: Self-pay | Admitting: Cardiovascular Disease

## 2024-01-26 ENCOUNTER — Ambulatory Visit: Payer: Medicare Other | Admitting: Radiation Oncology

## 2024-02-09 ENCOUNTER — Ambulatory Visit (INDEPENDENT_AMBULATORY_CARE_PROVIDER_SITE_OTHER): Payer: Medicare Other

## 2024-02-09 DIAGNOSIS — I639 Cerebral infarction, unspecified: Secondary | ICD-10-CM | POA: Diagnosis not present

## 2024-02-09 LAB — CUP PACEART REMOTE DEVICE CHECK
Date Time Interrogation Session: 20250518232732
Implantable Pulse Generator Implant Date: 20240223

## 2024-02-16 ENCOUNTER — Ambulatory Visit: Payer: Self-pay | Admitting: Cardiovascular Disease

## 2024-02-23 NOTE — Addendum Note (Signed)
 Addended by: Edra Govern D on: 02/23/2024 05:47 PM   Modules accepted: Orders

## 2024-02-23 NOTE — Progress Notes (Signed)
 [facility] Summary Report / Loop Recorder

## 2024-03-11 ENCOUNTER — Ambulatory Visit

## 2024-03-11 DIAGNOSIS — I639 Cerebral infarction, unspecified: Secondary | ICD-10-CM | POA: Diagnosis not present

## 2024-03-11 LAB — CUP PACEART REMOTE DEVICE CHECK
Date Time Interrogation Session: 20250618232525
Implantable Pulse Generator Implant Date: 20240223

## 2024-03-12 ENCOUNTER — Ambulatory Visit: Payer: Self-pay | Admitting: Cardiovascular Disease

## 2024-03-29 NOTE — Progress Notes (Signed)
 [facility] Summary Report / Loop Recorder

## 2024-04-12 ENCOUNTER — Ambulatory Visit

## 2024-04-12 DIAGNOSIS — I639 Cerebral infarction, unspecified: Secondary | ICD-10-CM

## 2024-04-13 LAB — CUP PACEART REMOTE DEVICE CHECK
Date Time Interrogation Session: 20250720233329
Implantable Pulse Generator Implant Date: 20240223

## 2024-04-18 ENCOUNTER — Ambulatory Visit: Payer: Self-pay | Admitting: Cardiovascular Disease

## 2024-05-03 ENCOUNTER — Ambulatory Visit: Payer: Medicare Other | Admitting: Internal Medicine

## 2024-05-13 ENCOUNTER — Ambulatory Visit (INDEPENDENT_AMBULATORY_CARE_PROVIDER_SITE_OTHER)

## 2024-05-13 DIAGNOSIS — I639 Cerebral infarction, unspecified: Secondary | ICD-10-CM | POA: Diagnosis not present

## 2024-05-13 LAB — CUP PACEART REMOTE DEVICE CHECK
Date Time Interrogation Session: 20250820234451
Implantable Pulse Generator Implant Date: 20240223

## 2024-05-13 NOTE — Progress Notes (Signed)
 [facility] Summary Report / Loop Recorder

## 2024-05-17 ENCOUNTER — Ambulatory Visit: Payer: Self-pay | Admitting: Cardiovascular Disease

## 2024-05-18 ENCOUNTER — Ambulatory Visit (INDEPENDENT_AMBULATORY_CARE_PROVIDER_SITE_OTHER): Payer: Medicare Other | Admitting: Neurology

## 2024-05-18 ENCOUNTER — Encounter: Payer: Self-pay | Admitting: Neurology

## 2024-05-18 VITALS — Ht 69.0 in

## 2024-05-18 DIAGNOSIS — G40109 Localization-related (focal) (partial) symptomatic epilepsy and epileptic syndromes with simple partial seizures, not intractable, without status epilepticus: Secondary | ICD-10-CM | POA: Diagnosis not present

## 2024-05-18 DIAGNOSIS — R251 Tremor, unspecified: Secondary | ICD-10-CM

## 2024-05-18 MED ORDER — LEVETIRACETAM 500 MG PO TABS: ORAL_TABLET | ORAL | 3 refills | Status: AC

## 2024-05-18 NOTE — Progress Notes (Signed)
 NEUROLOGY FOLLOW UP OFFICE NOTE  Damon Parrish [id number] 26-Jul-1941  HISTORY OF PRESENT ILLNESS: I had the pleasure of seeing Damon Parrish in follow-up in the neurology clinic on 05/18/2024.  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 He is again accompanied by his wife who helps supplement the history today.  Records and images were personally reviewed where available.  I personally reviewed brain MRI with and without contrast done 11/2023, no acute changes. There was expected evolution of previously noted white matter infarct in the posterior right corona radiata, stable remote lacunar infarct in the posterior left corona radiata, lateral right thalamus. Chronic bilateral subdural hygromas, right greater than left, are slightly more prominent than on prior study, likely reflecting progressive atrophy.  He had focal left-sided seizures in the setting of an acute infarct in the right frontoparietal region in 2017. He did well and Levetiracetam  was weaned off after 6 months, then in 2020, had another episdoe of involuntary left leg movements and weakness. Levetiracetam  restarted but he only took it once a day. He did well until 11/13/22 when he had left-sided weakness then again involuntary movements on the left hand. MRI brain showed acute right punctate MCA/ACA watershed infarcts, concerning for embolic source. On his last visit, they were reporting new symptoms of right hand tremors and jerking. MRI brain with and without contrast 10/2023 did not show any acute changes. There was expected evolution of with matter infarct in the posterior right corona radiata, remote lacunar infarct in the posterior left corona radiata, lateral right thalamus. Chronic bilateral subdural hygromas, right greater than left are slightly more prominent than on the prior study, likely reflecting progressive atrophy. They continue to note bilateral hand tremors, R>L. It does not affect eating or  drinking, his wife notices it when he is sitting at the kitchen. It does not last long, he can make it stop. Legs are unaffected. The tremors occur throughout the day, he would be watching TV and it starts shaking. No associated confusion or speech changes. He denies any headaches, dizziness, olfactory/gustatory hallucinations, focal numbness/tingling/weakness. He fell 8/10 when he stepped wrong coming out of church.    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 Records from [facility] and recent hospitalization were reviewed and will be summarized as follows. In 12/2015, he presented for new onset seizures with left arm>leg rhythmic shaking lasting for an hour. MRI brain showed patchy acute infarct in the right frontoparietal MCA/ACA territory. EEG was normal. He was discharged home on Levetiracetam . He had a 30-day event monitor at that time which was normal. Since he was 6 months out from stroke and seizure, Levetiracetam  was weaned off in 2017 and EEG after was normal. In 12/2018, his wife reports he had an episode of involuntary left leg movements and weakness. Levetiracetam  500mg  BID was restarted. Over time, his wife reports that he had been taking it once a day. He was event-free for almost 4 years until 11/13/22 when he noticed he could not hold the fork in his left hand. His left leg also got weak. After he finished eating, he noticed involuntary left hand movements. He had a witnessed episode on 11/14/22, his wife reports his left hand clenched  then fingers extended out. MRI brain showed acute right punctate MCA/ACA watershed infarcts, concerning for embolic source. CTA head and neck did  not show any significant stenosis, there was mild atherosclerotic change at the right carotid bifurcation but no stenosis, right ICA stent in the distal siphon to supraclinoid ICA with no residual aneurysm flow. Overnight EEG no epileptiform discharges, there was intermittent right hemisphere slowing. Echocardiogram showed EF of 60-65%, HbA1c 6.1, LDL 81. He was discharged home on increased dose of Lipitor , DAPT for 3 weeks followed by Plavix  alone, and Levetiracetam  500mg  BID. Exam on hospital discharge indicated mild left arm weakness with ataxia, tremor on right upper extremity (baseline). He was also discharged with a loop recorder due to concern for embolic source.   He and his wife deny any further seizures since 11/14/22. He is tolerating LEV 500mg  BID without significant side effects. His wife denies any staring/unresponsive episodes. He reports he was fully aware during the seizures, no difficulty speaking or comprehending. He denies any olfactory/gustatory hallucinations, deja vu, rising epigastric sensation, focal numbness/tingling/weakness, myoclonic jerks. He denies any headaches, diplopia, dysarthria/dysphagia, neck/back pain, bowel/bladder dysfunction.His wife just recently noticed some twitching of his right upper arm when she held it. He has had a left hand tremor over the past year that does not affect daily activities. Memory is pretty good. His wife has been managing medications for years. He gets 7-9 hours of interrupted sleep, he may take naps during the day. He lives with his wife.   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 He had a normal birth and early development.  There is no history of febrile convulsions, CNS infections such as meningitis/encephalitis, significant traumatic brain injury, neurosurgical procedures, or family history of seizures.   PAST MEDICAL HISTORY: Past Medical History:  Diagnosis Date   Allergy    seasonal   Arthritis    generalized  (01/09/2018)   Cataract 1995   Chronic lower back pain    last couple months (01/09/2018)   Chronic sinus complaints    Dyspnea    Emphysema of lung (HCC)    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   GERD (gastroesophageal reflux disease)    Glaucoma 1980   H/O cerebral aneurysm repair 2016   put stent in   High cholesterol    History of blood transfusion ~ 1950   while in hospital w/pneumonia (01/09/2018)   History of kidney stones    Hypertension    Nonruptured cerebral aneurysm    Pneumonia ~ 1950   Red man syndrome    Seizures (HCC) 12/27/2015; 12/28/2015   Stroke (HCC) 12/2015   denies residual on 01/09/2018)    MEDICATIONS: Current Outpatient Medications on File Prior to Visit  Medication Sig Dispense Refill   albuterol  (VENTOLIN  HFA) 108 (90 Base) MCG/ACT inhaler Inhale 2 puffs into the lungs every 6 (six) hours as needed for wheezing or shortness of breath. 8 g 3   amLODipine  (NORVASC ) 5 MG tablet Take 1 tablet (5 mg total) by mouth daily. 90 tablet 3   Ascorbic Acid  (VITAMIN C ) 1000 MG tablet Take 1,000 mg by mouth daily.     atorvastatin  (LIPITOR ) 80 MG tablet Take 1 tablet (80 mg total) by mouth at bedtime. 90 tablet 3   Brinzolamide -Brimonidine  (SIMBRINZA) 1-0.2 % SUSP Place 1 drop into the right eye 2 (two) times daily.     Cholecalciferol  125 MCG (5000 UT) TABS Take 5,000 Units by mouth daily.     clopidogrel  (PLAVIX ) 75 MG  tablet Take 1 tablet (75 mg total) by mouth daily. 90 tablet 3   finasteride  (PROSCAR ) 5 MG tablet Take 1 tablet (5 mg total) by mouth daily. 90 tablet 3   fluticasone  (FLONASE ) 50 MCG/ACT nasal spray Place 2 sprays into both nostrils daily.     levETIRAcetam  (KEPPRA ) 500 MG tablet Take 1 tablet (500 mg total) by mouth 2 (two) times daily. 180 tablet 3   Multiple Vitamin (MULTIVITAMIN) capsule Take 1 capsule by mouth daily. 50+     multivitamin-lutein  (OCUVITE-LUTEIN ) CAPS capsule Take 1 capsule by mouth daily.     prednisoLONE  acetate (PRED FORTE ) 1 %  ophthalmic suspension Place 1 drop into the left eye in the morning.     senna-docusate (SENOKOT-S) 8.6-50 MG tablet Take 2 tablets by mouth at bedtime as needed for mild constipation.     tamsulosin  (FLOMAX ) 0.4 MG CAPS capsule Take 1 capsule (0.4 mg total) by mouth daily after supper. 90 capsule 3   vitamin E  400 UNIT capsule Take 400 Units by mouth daily.     calcium  carbonate (OSCAL) 1500 (600 Ca) MG TABS tablet Take 600 mg of elemental calcium  by mouth daily with breakfast.     No current facility-administered medications on file prior to visit.    ALLERGIES: Allergies  Allergen Reactions   Cephalosporins Rash    Full body rash with systemic symptoms   Vancomycin  Rash    Full body rash with systemic symptoms    FAMILY HISTORY: Family History  Problem Relation Age of Onset   Lymphoma Mother    Dementia Mother    Cancer Mother    Stroke Mother    Aneurysm Father    Colon cancer Neg Hx     SOCIAL HISTORY: Social History   Socioeconomic History   Marital status: Married    Spouse name: Not on file   Number of children: Not on file   Years of education: Not on file   Highest education level: 12th grade  Occupational History   Not on file  Tobacco Use   Smoking status: Every Day    Average packs/day: 1 pack/day for 61.0 years (61.0 ttl pk-yrs)    Types: Cigarettes    Start date: 2   Smokeless tobacco: Never   Tobacco comments:    Smokes 4 packs of cigarettes in a week. 12/05/22 Tay    1/2 ppd per pt 01/10/23  Vaping Use   Vaping status: Never Used  Substance and Sexual Activity   Alcohol use: No   Drug use: No   Sexual activity: Not Currently  Other Topics Concern   Not on file  Social History Narrative   Are you right handed or left handed? Right handed    Are you currently employed ? Retired    What is your current occupation?   Do you live at home alone?no    Who lives with you? wife   What type of home do you live in: 1 story or 2 story?  1 story,  ranch home 1 step        Social Drivers of Corporate investment banker Strain: Low Risk  (11/05/2023)   Overall Financial Resource Strain (CARDIA)    Difficulty of Paying Living Expenses: Not hard at all  Food Insecurity: No Food Insecurity (11/05/2023)   Hunger Vital Sign    Worried About Running Out of Food in the Last Year: Never true    Ran Out of Food in the Last Year:  Never true  Transportation Needs: No Transportation Needs (11/05/2023)   PRAPARE - Administrator, Civil Service (Medical): No    Lack of Transportation (Non-Medical): No  Physical Activity: Patient Declined (11/05/2023)   Exercise Vital Sign    Days of Exercise per Week: Patient declined    Minutes of Exercise per Session: Patient declined  Stress: No Stress Concern Present (11/05/2023)   [employer] of Occupational Health - Occupational Stress Questionnaire    Feeling of Stress : Not at all  Social Connections: Socially Integrated (11/05/2023)   Social Connection and Isolation Panel    Frequency of Communication with Friends and Family: More than three times a week    Frequency of Social Gatherings with Friends and Family: More than three times a week    Attends Religious Services: More than 4 times per year    Active Member of [employer] or Organizations: Yes    Attends Engineer, structural: More than 4 times per year    Marital Status: Married  Catering manager Violence: Not At Risk (11/05/2023)   Humiliation, Afraid, Rape, and Kick questionnaire    Fear of Current or Ex-Partner: No    Emotionally Abused: No    Physically Abused: No    Sexually Abused: No     PHYSICAL EXAM: General: No acute distress Head:  Normocephalic/atraumatic Skin/Extremities: No rash, no edema Neurological Exam: alert and awake. No aphasia or dysarthria. Fund of knowledge is appropriate. Attention and concentration are normal.   Cranial nerves: Pupils equal, round. Extraocular movements intact with no nystagmus.  Visual fields full.  No facial asymmetry.  Motor: Bulk and tone normal, no cogwheeling, muscle strength 5/5 throughout with no pronator drift.   Finger to nose testing intact.  Gait slow and cautious with cane, favoring left leg. Good finger and foot taps, RAMs. No resting tremor, minimal postural tremor. There was a brief episode of irregular right hand side to side movements that seemed distractible.    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 They report episodes of right hand tremors, there was one witnessed today that appeared distractible. Etiology unclear, we discussed a trial of increasing Levetiracetam  to 750mg  BID (500mg : 1.5 tabs BID) and monitoring symptoms on higher dose. His wife will try to take a video of episodes at home. He does not drive. Follow-up in 4 months, call for any changes.    Thank you for allowing me to participate in his care.  Please do not hesitate to call for any questions or concerns.    Darice Shivers, M.D.   CC: Dr. Melvenia

## 2024-05-18 NOTE — Patient Instructions (Signed)
 Good to see you.  Increase Keppra  (Levetiracetam ) 500mg : Take 1 and 1/2 tablets twice a day  2. Monitor the hand shaking as we increase dose. If you can take a video and test him while he has the shaking, this would be helpful  3. Follow-up in 4 months, call for any changes

## 2024-06-14 ENCOUNTER — Ambulatory Visit (INDEPENDENT_AMBULATORY_CARE_PROVIDER_SITE_OTHER)

## 2024-06-14 DIAGNOSIS — I639 Cerebral infarction, unspecified: Secondary | ICD-10-CM

## 2024-06-14 LAB — CUP PACEART REMOTE DEVICE CHECK
Date Time Interrogation Session: 20250921232734
Implantable Pulse Generator Implant Date: 20240223

## 2024-06-15 NOTE — Progress Notes (Signed)
 Remote Loop Recorder Transmission

## 2024-06-16 ENCOUNTER — Ambulatory Visit: Payer: Self-pay | Admitting: Cardiovascular Disease

## 2024-06-17 ENCOUNTER — Ambulatory Visit: Admitting: Family Medicine

## 2024-06-17 ENCOUNTER — Encounter: Payer: Self-pay | Admitting: Family Medicine

## 2024-06-17 VITALS — BP 128/78 | HR 68 | Ht 69.0 in | Wt 191.2 lb

## 2024-06-17 DIAGNOSIS — J449 Chronic obstructive pulmonary disease, unspecified: Secondary | ICD-10-CM | POA: Diagnosis not present

## 2024-06-17 DIAGNOSIS — Z23 Encounter for immunization: Secondary | ICD-10-CM

## 2024-06-17 DIAGNOSIS — Z7689 Persons encountering health services in other specified circumstances: Secondary | ICD-10-CM | POA: Diagnosis not present

## 2024-06-17 DIAGNOSIS — E119 Type 2 diabetes mellitus without complications: Secondary | ICD-10-CM | POA: Diagnosis not present

## 2024-06-17 DIAGNOSIS — N400 Enlarged prostate without lower urinary tract symptoms: Secondary | ICD-10-CM | POA: Diagnosis not present

## 2024-06-17 LAB — POCT UA - MICROALBUMIN
Creatinine, POC: 300 mg/dL
Microalbumin Ur, POC: 150 mg/L

## 2024-06-17 LAB — POCT GLYCOSYLATED HEMOGLOBIN (HGB A1C): HbA1c POC (<> result, manual entry): 6.5 % (ref 4.0–5.6)

## 2024-06-17 MED ORDER — TAMSULOSIN HCL 0.4 MG PO CAPS: 0.4000 mg | ORAL_CAPSULE | Freq: Every day | ORAL | 3 refills | Status: AC

## 2024-06-17 NOTE — Assessment & Plan Note (Signed)
 Longstanding history secondary to heavy, ongoing tobacco use. Reports intermittent exertional dyspnea. Refuses to use prescribed rescue inhaler. Wheezing heard on exam.     - Continue to encourage use of albuterol  inhaler as needed for shortness of breath.     - Discussed potential benefit of daily maintenance inhaler therapy.

## 2024-06-17 NOTE — Assessment & Plan Note (Signed)
 Sending in flomax .  Also takes finasteride .

## 2024-06-17 NOTE — Assessment & Plan Note (Signed)
 History of pre-diabetes, now with an A1C of 6.8% as of 11/03/2023, which is in the diabetic range. Patient is not currently on any medication for diabetes. Counseled on diet.     - Check A1C via fingerstick today.     - Obtain urine for microalbumin today to assess for nephropathy.     - Discussed need to monitor A1C and that if it rises above 7, medication will be necessary.

## 2024-06-17 NOTE — Progress Notes (Signed)
 New Patient Office Visit  Subjective   Patient ID: Damon Parrish, male    DOB: [birth year]  Age: 83 y.o. MRN: [id number]  CC:  Chief Complaint  Patient presents with   New Patient (Initial Visit)    HPI Damon Parrish presents to establish care  Subjective - New patient establishing care after previous PCP left his practice. Patient lives near [place], [place], and finds this office location to be far but will continue care here until a closer practice can be found. All other specialists are in the [place] area. - No acute complaints or concerns today. - Attended with wife who provided a significant portion of the history.  Medications Medications include Norvasc , Plavix , Lipitor , Keppra  1.5 tabs BID, and finasteride . Takes eye drops including Simbrinza and another one reported as Pravastatin. He is currently out of Flomax . Also takes OTC supplements including calcium , vitamin D , vitamin C , vitamin E , a multivitamin for those over 50, and Ocuvite.  PMH, PSH, FH, Social Hx PMHx: - COPD. Reports intermittent shortness of breath on exertion. Has a rescue inhaler (albuterol ) but does not use it, stating he does not feel it is effective or needed. - Lung Cancer: History of two spots, status post treatment (unspecified). Awaiting follow-up scan next month. - CVA: History of strokes, most recent in 2003, resulting in balance issues and left-sided deficits. Experienced seizures with the strokes. - Seizure disorder: History of focal seizures. Treated with Keppra . 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 - Type 2 Diabetes Mellitus: A1C was 6.8% in February 2025. Previously characterized as pre-diabetes. - BPH. - Glaucoma. 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  PSH: - Brain aneurysm surgery: Minimally invasive coiling/stent procedure. - Loop recorder placement. - History of foot fracture. - Eye  surgeries for glaucoma, including tube placement. - Spinal abscess drainage (2019).  FH: - Mother: History of lymphoma and dementia. - Father: History of a brain aneurysm.  Social Hx: - Tobacco: Smokes approximately 1.5 packs per day since age 68. - Alcohol: Reports no current alcohol use. - Retired. - Lives with wife. Has one child living nearby; two others live out of state.  ROS - Constitutional: No fevers, chills. - Respiratory: Reports occasional dyspnea on exertion. Denies cough. Wheezing noted on exam. - Neurological: History of stroke with residual balance and left-sided deficits. Persistent right-hand tremor. History of seizures.     Outpatient Encounter Medications as of 06/17/2024  Medication Sig   albuterol  (VENTOLIN  HFA) 108 (90 Base) MCG/ACT inhaler Inhale 2 puffs into the lungs every 6 (six) hours as needed for wheezing or shortness of breath.   amLODipine  (NORVASC ) 5 MG tablet Take 1 tablet (5 mg total) by mouth daily.   Ascorbic Acid  (VITAMIN C ) 1000 MG tablet Take 1,000 mg by mouth daily.   atorvastatin  (LIPITOR ) 80 MG tablet Take 1 tablet (80 mg total) by mouth at bedtime.   Brinzolamide -Brimonidine  (SIMBRINZA) 1-0.2 % SUSP Place 1 drop into the right eye 2 (two) times daily.   calcium  carbonate (OSCAL) 1500 (600 Ca) MG TABS tablet Take 600 mg of elemental calcium  by mouth daily with breakfast.   Cholecalciferol  125 MCG (5000 UT) TABS Take 5,000 Units by mouth daily.   clopidogrel  (PLAVIX ) 75 MG tablet Take 1 tablet (75 mg total) by mouth daily.   finasteride  (PROSCAR ) 5 MG tablet Take 1 tablet (5 mg total) by mouth daily.   fluticasone  (FLONASE ) 50  MCG/ACT nasal spray Place 2 sprays into both nostrils daily.   levETIRAcetam  (KEPPRA ) 500 MG tablet Take 1 and 1/2 tablets twice a day   Multiple Vitamin (MULTIVITAMIN) capsule Take 1 capsule by mouth daily. 50+   multivitamin-lutein  (OCUVITE-LUTEIN ) CAPS capsule Take 1 capsule by mouth daily.   prednisoLONE  acetate  (PRED FORTE ) 1 % ophthalmic suspension Place 1 drop into the left eye in the morning.   senna-docusate (SENOKOT-S) 8.6-50 MG tablet Take 2 tablets by mouth at bedtime as needed for mild constipation.   tamsulosin  (FLOMAX ) 0.4 MG CAPS capsule Take 1 capsule (0.4 mg total) by mouth daily after supper.   vitamin E  400 UNIT capsule Take 400 Units by mouth daily.   [DISCONTINUED] tamsulosin  (FLOMAX ) 0.4 MG CAPS capsule Take 1 capsule (0.4 mg total) by mouth daily after supper.   No facility-administered encounter medications on file as of 06/17/2024.    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   GERD (gastroesophageal reflux disease)    Glaucoma 1980   H/O cerebral aneurysm repair 2016   put stent in   High cholesterol    History of blood transfusion ~ 1950   while in hospital w/pneumonia (01/09/2018)   History of kidney stones    Hypertension    Nonruptured cerebral aneurysm    Pneumonia ~ 1950   Red man syndrome    Seizures (HCC) 12/27/2015; 12/28/2015   Skin cancer 2014   Stroke (HCC) 12/2015 & 10/2022   denies residual on 01/09/2018)    Past Surgical History:  Procedure Laterality Date   BRAIN SURGERY     BRONCHIAL BIOPSY  12/17/2022   Procedure: BRONCHIAL BIOPSIES;  Surgeon: Brenna Adine CROME, DO;  Location: [facility];  Service: Pulmonary;;   BRONCHIAL NEEDLE ASPIRATION BIOPSY  12/17/2022   Procedure: BRONCHIAL NEEDLE ASPIRATION BIOPSIES;  Surgeon: Brenna Adine CROME, DO;  Location: [facility];  Service: Pulmonary;;   CATARACT EXTRACTION W/ INTRAOCULAR LENS  IMPLANT, BILATERAL Bilateral    COLONOSCOPY     2013 per patient: done in [place], normal, next colonoscopy due in 10 years   EYE SURGERY Left 06/17/2022   EYE SURGERY Right  06/17/2022   FOOT FRACTURE SURGERY Left    pins, etc. in there   FRACTURE SURGERY  2009   LOOP RECORDER INSERTION N/A 11/15/2022   Procedure: LOOP RECORDER INSERTION;  Surgeon: Nancey Eulas BRAVO, MD;  Location: [facility];  Service: Cardiovascular;  Laterality: N/A;   RADIOLOGY WITH ANESTHESIA N/A 05/04/2015   Procedure: Pipeline Embolization;  Surgeon: Gerldine Maizes, MD;  Location: [facility];  Service: Radiology;  Laterality: N/A;   RADIOLOGY WITH ANESTHESIA N/A 05/31/2015   Procedure: Pipeline Embolization;  Surgeon: Gerldine Maizes, MD;  Location: [facility];  Service: Radiology;  Laterality: N/A;    Family History  Problem Relation Age of Onset   Lymphoma Mother    Dementia Mother    Cancer Mother    Stroke Mother    Aneurysm Father    Colon cancer Neg Hx     Social History   Socioeconomic History   Marital status: Married    Spouse name: Not on file   Number of children: Not  on file   Years of education: Not on file   Highest education level: 12th grade  Occupational History   Not on file  Tobacco Use   Smoking status: Every Day    Average packs/day: 1 pack/day for 61.0 years (61.0 ttl pk-yrs)    Types: Cigarettes    Start date: 1958   Smokeless tobacco: Never   Tobacco comments:    Smokes 4 packs of cigarettes in a week. 12/05/22 [place]    1/2 ppd per pt 01/10/23  Vaping Use   Vaping status: Never Used  Substance and Sexual Activity   Alcohol use: No   Drug use: No   Sexual activity: Not Currently  Other Topics Concern   Not on file  Social History Narrative   Are you right handed or left handed? Right handed    Are you currently employed ? Retired    What is your current occupation?   Do you live at home alone?no    Who lives with you? wife   What type of home do you live in: 1 story or 2 story?  1 story, ranch home 1 step        Social Drivers of Corporate investment banker Strain: Low Risk  (06/14/2024)   Overall Financial Resource Strain  (CARDIA)    Difficulty of Paying Living Expenses: Not hard at all  Food Insecurity: No Food Insecurity (06/14/2024)   Hunger Vital Sign    Worried About Running Out of Food in the Last Year: Never true    Ran Out of Food in the Last Year: Never true  Transportation Needs: No Transportation Needs (06/14/2024)   PRAPARE - Administrator, Civil Service (Medical): No    Lack of Transportation (Non-Medical): No  Physical Activity: Inactive (06/14/2024)   Exercise Vital Sign    Days of Exercise per Week: 0 days    Minutes of Exercise per Session: Not on file  Stress: No Stress Concern Present (06/14/2024)   [employer] of Occupational Health - Occupational Stress Questionnaire    Feeling of Stress: Not at all  Social Connections: Socially Integrated (06/14/2024)   Social Connection and Isolation Panel    Frequency of Communication with Friends and Family: Three times a week    Frequency of Social Gatherings with Friends and Family: Twice a week    Attends Religious Services: More than 4 times per year    Active Member of [employer] or Organizations: Yes    Attends Banker Meetings: Patient declined    Marital Status: Married  Catering manager Violence: Not At Risk (11/05/2023)   Humiliation, Afraid, Rape, and Kick questionnaire    Fear of Current or Ex-Partner: No    Emotionally Abused: No    Physically Abused: No    Sexually Abused: No    ROS     Objective   BP 128/78   Pulse 68   Ht 5' 9 (1.753 m)   Wt 191 lb 3.2 oz (86.7 kg)   SpO2 99%   BMI 28.24 kg/m   Physical Exam General: Alert, cooperative. Appears stated age. Accompanied by wife. HEENT: EOM intact. Evidence of prior right eye surgery medially, consistent with glaucoma drain. CV: rrr PULM: b/l mild Wheezing noted on auscultation. Gi: soft, nbs.  Msk: strength equal. Ambulates with cane NEURO: Grossly intact. Noted right-hand tremor. Ext: no pedal edema.  Psych: pleasant affect      Assessment & Plan:   Encounter to establish  care  Benign prostatic hyperplasia without lower urinary tract symptoms Assessment & Plan: Sending in flomax .  Also takes finasteride .   Orders: -     Tamsulosin  HCl; Take 1 capsule (0.4 mg total) by mouth daily after supper.  Dispense: 90 capsule; Refill: 3  Type 2 diabetes mellitus without complication, without long-term current use of insulin  [facility]) Assessment & Plan:     History of pre-diabetes, now with an A1C of 6.8% as of 11/03/2023, which is in the diabetic range. Patient is not currently on any medication for diabetes. Counseled on diet.     - Check A1C via fingerstick today.     - Obtain urine for microalbumin today to assess for nephropathy.     - Discussed need to monitor A1C and that if it rises above 7, medication will be necessary.  Orders: -     POCT glycosylated hemoglobin (Hb A1C) -     Microalbumin / creatinine urine ratio -     POCT UA - Microalbumin  COPD GOLD 2 Assessment & Plan:     Longstanding history secondary to heavy, ongoing tobacco use. Reports intermittent exertional dyspnea. Refuses to use prescribed rescue inhaler. Wheezing heard on exam.     - Continue to encourage use of albuterol  inhaler as needed for shortness of breath.     - Discussed potential benefit of daily maintenance inhaler therapy.   Other orders -     Flu vaccine HIGH DOSE PF(Fluzone Trivalent)    Return in about 5 months (around 11/17/2024) for physical.   Toribio MARLA Slain, MD

## 2024-06-17 NOTE — Patient Instructions (Addendum)
 It was nice to see you today,  We addressed the following topics today: -I am getting your A1c and urine protein test. - We will work on trying to find you a doctor's office closer to your home. - I have sent in your Flomax  to the pharmacy in [place]  Have a great day,  Rolan Slain, MD

## 2024-06-18 LAB — MICROALBUMIN / CREATININE URINE RATIO
Creatinine, Urine: 120.9 mg/dL
Microalb/Creat Ratio: 137 mg/g{creat} — ABNORMAL HIGH (ref 0–29)
Microalbumin, Urine: 165.3 ug/mL

## 2024-06-24 ENCOUNTER — Ambulatory Visit (HOSPITAL_COMMUNITY)
Admission: RE | Admit: 2024-06-24 | Discharge: 2024-06-24 | Disposition: A | Discharge status: Home / Self Care | Source: Ambulatory Visit | Attending: Radiation Oncology | Admitting: Radiation Oncology

## 2024-06-24 DIAGNOSIS — C3411 Malignant neoplasm of upper lobe, right bronchus or lung: Secondary | ICD-10-CM | POA: Insufficient documentation

## 2024-06-24 MED ORDER — IOHEXOL 300 MG/ML  SOLN
75.0000 mL | Freq: Once | INTRAMUSCULAR | Status: AC | PRN
  Administered 2024-06-24: 75 mL via INTRAVENOUS

## 2024-06-28 NOTE — Progress Notes (Signed)
 Remote Loop Recorder Transmission

## 2024-06-29 LAB — POCT I-STAT CREATININE: Creatinine, Ser: 1.1 mg/dL (ref 0.61–1.24)

## 2024-07-01 ENCOUNTER — Telehealth: Payer: Self-pay | Admitting: Radiation Oncology

## 2024-07-05 ENCOUNTER — Telehealth: Payer: Self-pay | Admitting: Radiation Oncology

## 2024-07-05 ENCOUNTER — Ambulatory Visit: Payer: Self-pay | Admitting: Radiation Oncology

## 2024-07-05 DIAGNOSIS — C3411 Malignant neoplasm of upper lobe, right bronchus or lung: Secondary | ICD-10-CM

## 2024-07-05 NOTE — Telephone Encounter (Signed)
 error

## 2024-07-05 NOTE — Telephone Encounter (Signed)
 I called and spoke with the patient's wife and she states her husband has been struggling with his breathing, more short of breath and coughing. He would like to get re-established with a new provider at [facility]. We reviewed the stable CT scan results and the plan to repeat this in 6 months time per guidelines. I will cancel his appt on 07/19/24 since we had a call today.

## 2024-07-09 ENCOUNTER — Ambulatory Visit (INDEPENDENT_AMBULATORY_CARE_PROVIDER_SITE_OTHER): Admitting: Acute Care

## 2024-07-09 ENCOUNTER — Encounter: Payer: Self-pay | Admitting: Acute Care

## 2024-07-09 VITALS — BP 134/74 | HR 69 | Temp 97.6°F | Ht 69.0 in | Wt 196.2 lb

## 2024-07-09 DIAGNOSIS — J439 Emphysema, unspecified: Secondary | ICD-10-CM | POA: Diagnosis not present

## 2024-07-09 DIAGNOSIS — F17200 Nicotine dependence, unspecified, uncomplicated: Secondary | ICD-10-CM

## 2024-07-09 DIAGNOSIS — Z85118 Personal history of other malignant neoplasm of bronchus and lung: Secondary | ICD-10-CM | POA: Diagnosis not present

## 2024-07-09 DIAGNOSIS — J441 Chronic obstructive pulmonary disease with (acute) exacerbation: Secondary | ICD-10-CM | POA: Diagnosis not present

## 2024-07-09 DIAGNOSIS — F17210 Nicotine dependence, cigarettes, uncomplicated: Secondary | ICD-10-CM | POA: Diagnosis not present

## 2024-07-09 DIAGNOSIS — R053 Chronic cough: Secondary | ICD-10-CM

## 2024-07-09 DIAGNOSIS — J449 Chronic obstructive pulmonary disease, unspecified: Secondary | ICD-10-CM

## 2024-07-09 MED ORDER — PREDNISONE 10 MG PO TABS: ORAL_TABLET | ORAL | 0 refills | Status: DC

## 2024-07-09 MED ORDER — BREZTRI AEROSPHERE 160-9-4.8 MCG/ACT IN AERO: 2.0000 | INHALATION_SPRAY | Freq: Two times a day (BID) | RESPIRATORY_TRACT | Status: AC

## 2024-07-09 MED ORDER — DOXYCYCLINE HYCLATE 100 MG PO TABS: 100.0000 mg | ORAL_TABLET | Freq: Two times a day (BID) | ORAL | 0 refills | Status: DC

## 2024-07-09 MED ORDER — ALBUTEROL SULFATE HFA 108 (90 BASE) MCG/ACT IN AERS: 1.0000 | INHALATION_SPRAY | Freq: Four times a day (QID) | RESPIRATORY_TRACT | 3 refills | Status: AC | PRN

## 2024-07-09 NOTE — Patient Instructions (Addendum)
 It is good to see you today.  I am sorry you are having difficulty breathing. We will treat you for COPD exacerbation. I have sent in a prescription for doxycycline  which is an antibiotic. Please take 1 tablet in the morning and 1 tablet in the evening for 7 days. Take with a full glass of water. I will also send in a prednisone  taper as you are wheezing bilaterally. Prednisone  taper; 10 mg tablets: 4 tabs x 2 days, 3 tabs x 2 days, 2 tabs x 2 days 1 tab x 2 days then stop.  We will start you on a maintenance inhaler for your COPD. Take Breztri, 2 puffs in the morning and 2 puffs in the evening without fail every day whether you feel good or bad. Rinse mouth after use. I will also send in a prescription for albuterol  which is a rescue inhaler. Take this as needed for shortness of breath or wheezing up to every 6 hours as needed. If you need this more than 3 times daily call to be seen. We will give you samples of the Breztri today. Please call us  and let us  know if you would to continue taking this medication  so we can send in a prescription. We can also give you paperwork at that time if you determined you would like to continue on this medication that we will help to make it less expensive. Follow-up in 1 month with Lauraine NP to ensure you are doing well on the inhalers and that your breathing is better. Once you are better we will consider pulmonary function testing to reassess your lung function and compare to the pulmonary function testing that you had done last year. Continue to follow-up with radiation oncology regarding surveillance of the lung nodule. Call us  if you need us  sooner. Please contact office for sooner follow up if symptoms do not improve or worsen or seek emergency care   Work on quitting smoking. You can receive free nicotine  replacement therapy (patches, gum, or mints) by calling 1-800-QUIT NOW. Please call so we can get you on the path to becoming a non-smoker. I know  it is hard, but you can do this!  Hypnosis for smoking cessation  [employer] [phone]  Acupuncture for smoking cessation  [employer] [phone]

## 2024-07-09 NOTE — Progress Notes (Signed)
 History of Present Illness Damon Parrish is a 83 y.o. male current every day smoker with past medical history of gastroesophageal reflux, hypertension, stroke. He was recently hospitalized for stroke and during this workup had a CT scan of the chest which revealed a new right upper lobe pulmonary nodule. He was referred to Dr. Brenna for evaluation of pulmonary nodule.    07/09/2024 Discussed the use of AI scribe software for clinical note transcription with the patient, who gave verbal consent to proceed.  Synopsis 83 year old gentleman longstanding history of tobacco use. Recent stroke.During work up he was  found to have a right upper lobe medial apical lung nodule concerning for malignancy. He underwent Flexible video fiberoptic bronchoscopy with robotic assistance and biopsies on 12/17/2022. Biopsies were + for adenocarcinoma. He was seen by Dr. Gatha 12/23/2022 He discussed treatment options. He explained to the patient that usually the best option is surgical resection if he is a good candidate for surgery. He ordered PFTs and referred the patient to cardiothoracic surgery for evaluation of his condition.We scheduled PFT's today . If the patient is not a good surgical candidate, he  will need to be  referred  to radiation oncology for consideration of SBRT to this lesion followed by close monitoring. PFT's are scheduled for 01/03/2023, and He saw Dr. Shyrl 01/10/2023. Pt did not have surgery, but was treated with SBRT x 3 treatments. He is undergoing surveillance imaging by radiation oncology. He is here today with complaints of shortness of breath and a cough that has changed. ( Per his wife, it is deeper, and started several months ago. )   DIAGNOSIS: Stage 1A (T1b, N0, M0) non-small cell lung cancer, adenocarcinoma presented with right upper lobe lung nodule diagnosed in March 2024.       History of Present Illness Damon Parrish is an 83 year old male current everyday smoker  with history lung cancer and COPD who presents for COPD flare.  He has a history of lung cancer and underwent three SBRT treatments. His last chest scan on October 2nd was performed as part of his surveillance imaging.  Most recent scan showed stable exam, no new or suspicious lung masses consolidations or lung nodule seen . He is currently under surveillance imaging for his cancer by radiation oncology.  He has been experiencing a deep, persistent cough for the last couple of months, which is different from his previous cough. The cough is deep and prolonged, causing facial redness, and occurs during activities such as eating and while at rest. No fever is present, and the sputum produced is white.  He also experiences shortness of breath, which has been a concern. He has not been using inhalers for his COPD, as he believes they do not help, and is unsure of the specific inhalers he has tried in the past. He has not used an albuterol  inhaler before.  We discussed that the patient would most likely benefit from maintenance inhaler in addition to a rescue inhaler.  He is currently open to consideration of utilizing inhalers as his dyspnea has progressively become worse.  On exam and auscultation he has considerable wheezing.  He denies any discolored secretions, fever or any other symptoms of illness.  No ankle swelling is reported. He has a history of smoking, which has contributed to his COPD and reduced diffusion capacity, affecting his breathing.  He does continue to smoke daily.  He has a 61-pack-year smoking history.  He denies any unintentional  weight loss or hemoptysis  We discussed today for 3 minutes that he needs to quit smoking.  He verbalizes understanding.  I have provided him with resources in the community that he can use to help with smoking cessation.     Test Results: Cytology 12/17/2022 A.  RIGHT LUNG, UPPER LOBE, NODULE, FINE NEEDLE ASPIRATION AND BIOPSIES:  - Malignant  -  Non-small cell carcinoma compatible with adenocarcinoma    PET scan 12/12/2022 Hypermetabolic medial right apical lung nodule, suspicious for primary bronchogenic neoplasm.     Super D PET 12/12/22 15 x 8 mm irregular medial right apical nodule, grossly unchanged, suspicious for primary bronchogenic neoplasm.   No findings suspicious for metastatic disease.   11/13/2022 CT Chest 13 mm suspicious right solid pulmonary nodule within the upper lobe. If the patient would be a therapy candidate should neoplasm be detected, oncology referral may be useful. Per Fleischner Society Guidelines, consider a non-contrast Chest CT at 3 months, a PET/CT, or tissue sampling.            Latest Ref Rng & Units 10/27/2023    8:38 AM 12/02/2022   10:36 AM 11/18/2022    6:58 AM  CBC  WBC 3.4 - 10.8 x10E3/uL 6.9  7.3  7.2   Hemoglobin 13.0 - 17.7 g/dL 85.5  85.4  85.2   Hematocrit 37.5 - 51.0 % 42.3  41.4  42.9   Platelets 150 - 450 x10E3/uL 224  259  211        Latest Ref Rng & Units 06/24/2024   12:36 PM 10/27/2023    8:38 AM 07/09/2023    4:04 PM  BMP  Glucose 70 - 99 mg/dL  884    BUN 8 - 27 mg/dL  15    Creatinine 9.38 - 1.24 mg/dL 8.89  8.97  8.99   BUN/Creat Ratio 10 - 24  15    Sodium 134 - 144 mmol/L  137    Potassium 3.5 - 5.2 mmol/L  4.6    Chloride 96 - 106 mmol/L  101    CO2 20 - 29 mmol/L  23    Calcium  8.6 - 10.2 mg/dL  9.2      BNP No results found for: BNP  ProBNP No results found for: PROBNP  PFT    Component Value Date/Time   FEV1PRE 1.87 01/03/2023 1007   FEV1POST 2.03 01/03/2023 1007   FVCPRE 2.93 01/03/2023 1007   FVCPOST 3.23 01/03/2023 1007   TLC 6.73 01/03/2023 1007   DLCOUNC 15.58 01/03/2023 1007   PREFEV1FVCRT 64 01/03/2023 1007   PSTFEV1FVCRT 63 01/03/2023 1007    CT CHEST W CONTRAST Result Date: 06/24/2024 CLINICAL DATA:  Non-small cell lung cancer (NSCLC), monitor S/P SBRT. * Tracking Code: BO * EXAM: CT CHEST WITH CONTRAST TECHNIQUE:  Multidetector CT imaging of the chest was performed during intravenous contrast administration. RADIATION DOSE REDUCTION: This exam was performed according to the departmental dose-optimization program which includes automated exposure control, adjustment of the mA and/or kV according to patient size and/or use of iterative reconstruction technique. CONTRAST:  75mL OMNIPAQUE  IOHEXOL  300 MG/ML  SOLN COMPARISON:  CT scan chest from 12/09/2023. FINDINGS: Cardiovascular: Normal cardiac size. No pericardial effusion. No aortic aneurysm. There are coronary artery calcifications, in keeping with coronary artery disease. There are also mild-to-moderate peripheral atherosclerotic vascular calcifications of thoracic aorta and its major branches. Mediastinum/Nodes: Visualized thyroid gland appears grossly unremarkable. No solid / cystic mediastinal masses. The esophagus is nondistended precluding optimal assessment.  No axillary, mediastinal or hilar lymphadenopathy by size criteria. Lungs/Pleura: The central tracheo-bronchial tree is patent. Small amount of mucus secretion noted in the right mainstem bronchus. Mild-to-moderate upper lobe predominant centrilobular emphysema noted. Redemonstration of linear area of scarring/staple line in the right upper lobe, which appears essentially similar to the prior study. There is a fiducial marker in this region, unchanged. Findings favor posttreatment changes without significant interval change. No new mass or consolidation. No pleural effusion or pneumothorax. No new or suspicious lung nodules. Upper Abdomen: Visualized upper abdominal viscera within normal limits. Musculoskeletal: Asymmetric mild right and moderate left gynecomastia noted. The visualized soft tissues of the chest wall are grossly unremarkable. No suspicious osseous lesions. There are moderate multilevel degenerative changes in the visualized spine. IMPRESSION: 1. Stable exam. No new or suspicious lung mass,  consolidation or lung nodule seen. 2. Multiple other nonacute observations, as described above. Electronically Signed   By: Ree Molt M.D.   On: 06/24/2024 13:42   CUP PACEART REMOTE DEVICE CHECK Result Date: 06/14/2024 ILR summary report received. Battery status OK. Normal device function. No new symptom, tachy, brady, or pause episodes. No new AF episodes. 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   GERD (gastroesophageal reflux disease)    Glaucoma 1980   H/O cerebral aneurysm repair 2016   put stent in   High cholesterol    History of blood transfusion ~ 1950   while in hospital w/pneumonia (01/09/2018)   History of kidney stones    Hypertension    Nonruptured cerebral aneurysm    Pneumonia ~ 1950   Red man syndrome    Seizures (HCC) 12/27/2015; 12/28/2015   Skin cancer 2014   Stroke (HCC) 12/2015 & 10/2022   denies residual on 01/09/2018)     Social History   Tobacco Use   Smoking status: Every Day    Average packs/day: 1 pack/day for 61.0 years (61.0 ttl pk-yrs)    Types: Cigarettes    Start date: 1958   Smokeless tobacco: Never   Tobacco comments:    1 pack a day 07/09/2024 KRd  Vaping Use   Vaping status: Never Used  Substance Use Topics   Alcohol use: No   Drug use: No    Damon Parrish reports that he has been smoking cigarettes. He started smoking about 67 years ago. He has a 61 pack-year smoking history. He has never used smokeless tobacco. He reports that he does not drink alcohol and does not use drugs.  Tobacco Cessation: Ready to quit: Not Answered Counseling given: Not Answered Tobacco comments: 1 pack a day 07/09/2024 KRd Current everyday smoker with  a 61-pack-year smoking history Counseled x 3 minutes today on smoking cessation.  Resources provided please see AVS  Past surgical hx, Family hx, Social hx all reviewed.  Current Outpatient Medications on File Prior to Visit  Medication Sig   albuterol  (VENTOLIN  HFA) 108 (90 Base) MCG/ACT inhaler Inhale 2 puffs into the lungs every 6 (six) hours as needed for wheezing or shortness of breath.   amLODipine  (NORVASC ) 5 MG tablet Take 1 tablet (5 mg total) by  mouth daily.   Ascorbic Acid  (VITAMIN C ) 1000 MG tablet Take 1,000 mg by mouth daily.   atorvastatin  (LIPITOR ) 80 MG tablet Take 1 tablet (80 mg total) by mouth at bedtime.   Brinzolamide -Brimonidine  (SIMBRINZA) 1-0.2 % SUSP Place 1 drop into the right eye 2 (two) times daily.   calcium  carbonate (OSCAL) 1500 (600 Ca) MG TABS tablet Take 600 mg of elemental calcium  by mouth daily with breakfast.   Cholecalciferol  125 MCG (5000 UT) TABS Take 5,000 Units by mouth daily.   clopidogrel  (PLAVIX ) 75 MG tablet Take 1 tablet (75 mg total) by mouth daily.   finasteride  (PROSCAR ) 5 MG tablet Take 1 tablet (5 mg total) by mouth daily.   fluticasone  (FLONASE ) 50 MCG/ACT nasal spray Place 2 sprays into both nostrils daily.   levETIRAcetam  (KEPPRA ) 500 MG tablet Take 1 and 1/2 tablets twice a day   Multiple Vitamin (MULTIVITAMIN) capsule Take 1 capsule by mouth daily. 50+   multivitamin-lutein  (OCUVITE-LUTEIN ) CAPS capsule Take 1 capsule by mouth daily.   prednisoLONE  acetate (PRED FORTE ) 1 % ophthalmic suspension Place 1 drop into the left eye in the morning.   senna-docusate (SENOKOT-S) 8.6-50 MG tablet Take 2 tablets by mouth at bedtime as needed for mild constipation.   tamsulosin  (FLOMAX ) 0.4 MG CAPS capsule Take 1 capsule (0.4 mg total) by mouth daily after supper.   vitamin E  400 UNIT capsule Take 400 Units by mouth daily.   No current facility-administered medications on file prior to visit.     Allergies  Allergen Reactions   Cephalosporins  Rash    Full body rash with systemic symptoms   Vancomycin  Rash    Full body rash with systemic symptoms    Review Of Systems:  Constitutional:   No  weight loss, night sweats,  Fevers, chills, fatigue, or  lassitude.  HEENT:   No headaches,  Difficulty swallowing,  Tooth/dental problems, or  Sore throat,                No sneezing, itching, ear ache, nasal congestion, post nasal drip,   CV:  No chest pain,  Orthopnea, PND, swelling in lower extremities, anasarca, dizziness, palpitations, syncope.   GI  No heartburn, indigestion, abdominal pain, nausea, vomiting, diarrhea, change in bowel habits, loss of appetite, bloody stools.   Resp: + shortness of breath with exertion less at rest.  No excess mucus, + productive cough,  + non-productive cough,  No coughing up of blood.  No change in color of mucus.  ++ wheezing.  No chest wall deformity  Skin: no rash or lesions.  GU: no dysuria, change in color of urine, no urgency or frequency.  No flank pain, no hematuria   MS:  No joint pain or swelling.  + decreased range of motion.  No back pain.  Psych:  No change in mood or affect. No depression or anxiety.  No memory loss.   Vital Signs BP 134/74   Pulse 69   Temp 97.6 F (36.4 C) (Oral)   Ht 5' 9 (1.753 m)   Wt 196 lb 3.2 oz (89 kg)   SpO2 98%   BMI 28.97 kg/m    Physical Exam:  General- No distress,  A&Ox3, pleasant elderly gentleman ENT: No sinus tenderness, TM clear, pale nasal mucosa, no oral exudate,no post nasal drip, no LAN Cardiac: S1, S2, regular rate and rhythm, no murmur Chest: +  wheeze both inspiratory and expiratory/ No rales/ dullness; no accessory muscle use, no nasal  flaring, no sternal retractions Abd.: Soft Non-tender, ND, BS +, Body mass index is 28.97 kg/m.  Ext: No clubbing cyanosis,+ 1 +  edema, no other obvious deformities noted Neuro: Physical deconditioning, moving all extremities x 4, alert and oriented x 3, appropriate Skin: No rashes, warm  and dry, no obvious skin lesions Psych: normal mood and behavior   Assessment/Plan  Assessment and Plan Assessment & Plan Chronic obstructive pulmonary disease (COPD) with acute exacerbation and emphysema COPD exacerbation with persistent cough and wheezing. Reduced diffusion capacity due to smoking-related scarring and emphysema. - Provide Breztri inhaler samples, instruct to use two puffs in the morning and two in the evening. -Rinse mouth after use - Prescribe albuterol  inhaler, one to two puffs every six hours as needed. - Prescribe doxycycline , one tablet in the morning and one in the evening for one week. - Prescribe prednisone , four tablets for two days, three tablets for two days, two tablets for two days, one tablet for two days, then stop.  Tobacco use disorder Continued tobacco use contributing to COPD and reduced diffusion capacity. - Smoking cessation counseling today x 3 minutes, resources provided see AVS  Lung cancer, status post radiation therapy, under surveillance Lung cancer treated with radiation.  Recent chest scan showed no suspicious lung diseases or nodules. - Continued surveillance imaging ongoing. - Understands that continuing to smoke decreases response to treatment of lung cancer  AVS 07/09/2024 I am sorry you are having difficulty breathing. We will treat you for COPD exacerbation. I have sent in a prescription for doxycycline  which is an antibiotic. Please take 1 tablet in the morning and 1 tablet in the evening for 7 days. Take with a full glass of water. I will also send in a prednisone  taper as you are wheezing bilaterally. Prednisone  taper; 10 mg tablets: 4 tabs x 2 days, 3 tabs x 2 days, 2 tabs x 2 days 1 tab x 2 days then stop.  We will start you on a maintenance inhaler for your COPD. Take Breztri, 2 puffs in the morning and 2 puffs in the evening without fail every day whether you feel good or bad. Rinse mouth after use. I will also send in  a prescription for albuterol  which is a rescue inhaler. Take this as needed for shortness of breath or wheezing up to every 6 hours as needed. If you need this more than 3 times daily call to be seen. We will give you samples of the Breztri today. Please call us  and let us  know if you would to continue taking this medication  so we can send in a prescription. We can also give you paperwork at that time if you determined you would like to continue on this medication that we will help to make it less expensive. Follow-up in 1 month with Lauraine NP to ensure you are doing well on the inhalers and that your breathing is better. Once you are better we will consider pulmonary function testing to reassess your lung function and compare to the pulmonary function testing that you had done last year. Continue to follow-up with radiation oncology regarding surveillance of the lung nodule. Call us  if you need us  sooner. Please contact office for sooner follow up if symptoms do not improve or worsen or seek emergency care   Work on quitting smoking. You can receive free nicotine  replacement therapy (patches, gum, or mints) by calling 1-800-QUIT NOW. Please call so we can get you on the path to becoming a  non-smoker. I know it is hard, but you can do this!  Hypnosis for smoking cessation  [employer] [phone]  Acupuncture for smoking cessation  [employer] [phone]     I spent 25 minutes dedicated to the care of this patient on the date of this encounter to include pre-visit review of records, face-to-face time with the patient discussing conditions above, post visit ordering of testing, clinical documentation with the electronic health record, making appropriate referrals as documented, and communicating necessary information to the patient's healthcare team.      Lauraine JULIANNA Lites, NP 07/09/2024  11:36 AM

## 2024-07-12 ENCOUNTER — Ambulatory Visit: Admitting: Radiation Oncology

## 2024-07-14 ENCOUNTER — Encounter

## 2024-07-15 ENCOUNTER — Ambulatory Visit

## 2024-07-15 ENCOUNTER — Encounter

## 2024-07-15 DIAGNOSIS — I639 Cerebral infarction, unspecified: Secondary | ICD-10-CM

## 2024-07-15 LAB — CUP PACEART REMOTE DEVICE CHECK
Date Time Interrogation Session: 20251022232155
Implantable Pulse Generator Implant Date: 20240223

## 2024-07-17 NOTE — Progress Notes (Signed)
 Remote Loop Recorder Transmission

## 2024-07-19 ENCOUNTER — Ambulatory Visit: Admitting: Radiation Oncology

## 2024-07-28 ENCOUNTER — Ambulatory Visit: Payer: Self-pay | Admitting: Cardiovascular Disease

## 2024-07-29 ENCOUNTER — Encounter

## 2024-08-10 ENCOUNTER — Ambulatory Visit (INDEPENDENT_AMBULATORY_CARE_PROVIDER_SITE_OTHER): Admitting: Acute Care

## 2024-08-10 ENCOUNTER — Ambulatory Visit (INDEPENDENT_AMBULATORY_CARE_PROVIDER_SITE_OTHER)

## 2024-08-10 ENCOUNTER — Encounter: Payer: Self-pay | Admitting: Acute Care

## 2024-08-10 VITALS — BP 119/67 | HR 75 | Temp 97.5°F | Ht 69.0 in | Wt 194.0 lb

## 2024-08-10 DIAGNOSIS — R062 Wheezing: Secondary | ICD-10-CM

## 2024-08-10 DIAGNOSIS — F17210 Nicotine dependence, cigarettes, uncomplicated: Secondary | ICD-10-CM

## 2024-08-10 DIAGNOSIS — Z85118 Personal history of other malignant neoplasm of bronchus and lung: Secondary | ICD-10-CM

## 2024-08-10 DIAGNOSIS — R0602 Shortness of breath: Secondary | ICD-10-CM

## 2024-08-10 DIAGNOSIS — F17200 Nicotine dependence, unspecified, uncomplicated: Secondary | ICD-10-CM

## 2024-08-10 DIAGNOSIS — J449 Chronic obstructive pulmonary disease, unspecified: Secondary | ICD-10-CM | POA: Diagnosis not present

## 2024-08-10 MED ORDER — PREDNISONE 10 MG PO TABS: ORAL_TABLET | ORAL | 0 refills | Status: AC

## 2024-08-10 MED ORDER — BREZTRI AEROSPHERE 160-9-4.8 MCG/ACT IN AERO: 2.0000 | INHALATION_SPRAY | Freq: Two times a day (BID) | RESPIRATORY_TRACT | Status: AC

## 2024-08-10 MED ORDER — AEROCHAMBER PLUS FLO-VU MISC
0 refills | Status: AC
Start: 2024-08-10 — End: ?

## 2024-08-10 MED ORDER — IPRATROPIUM-ALBUTEROL 0.5-2.5 (3) MG/3ML IN SOLN
3.0000 mL | Freq: Once | RESPIRATORY_TRACT | Status: AC
  Administered 2024-08-10: 3 mL via RESPIRATORY_TRACT

## 2024-08-10 NOTE — Progress Notes (Signed)
 History of Present Illness Damon Parrish is a 83 y.o. 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  He was referred to Dr. Brenna for evaluation of pulmonary nodule. He is here today for follow up of an acute visit 07/09/2024.   Synopsis 83 year old gentleman longstanding history of tobacco use. Recent stroke.During work up he was  found to have a right upper lobe medial apical lung nodule concerning for malignancy. He underwent Flexible video fiberoptic bronchoscopy with robotic assistance and biopsies on 12/17/2022. Biopsies were + for adenocarcinoma. He was seen by Dr. Gatha 12/23/2022 He discussed treatment options. He explained to the patient that usually the best option is surgical resection if he is a good candidate for surgery. He ordered PFTs and referred the patient to cardiothoracic surgery for evaluation of his condition.We scheduled PFT's today . If the patient is not a good surgical candidate, he  will need to be  referred  to radiation oncology for consideration of SBRT to this lesion followed by close monitoring. PFT's are scheduled for 01/03/2023, and He saw Dr. Shyrl 01/10/2023. Pt did not have surgery, but was treated with SBRT x 3 treatments. He is undergoing surveillance imaging by radiation oncology. He was  seen 07/09/2024  for an acute pulmonary visit with complaints of shortness of breath and a cough that has changed. ( Per his wife, it is deeper, and started several months ago. )He was treated with Doxycycline  and a prednisone  taper. He is here today for follow up.   DIAGNOSIS: Stage 1A (T1b, N0, M0) non-small cell lung cancer, adenocarcinoma presented with right upper lobe lung nodule diagnosed in March 2024.        08/10/2024 Discussed the use of AI scribe software for clinical note transcription with the patient, who gave  verbal consent to proceed.  History of Present Illness Pt. Presents for follow up after treatment for an acute flare. He was seen 07/09/2024. He was treated with Doxycycline  and prednisone . Per his wife, he did get better after the antibiotic and prednisone  taper. She states this lasted 1-2 days and now he is back to having a cough and shortness of breath. Per his wife the cough is bad. Secretions are white and minimal. They are thick. He deniers any fever or chills. He states he has been wheezing. He denies any choking on food, but his wife states he does cough with liquids.    Last CT Chest 06/24/2024 did not show any new or suspicious lung masses, or consolidation. His oxygen levels are 98% [employer] today.   We discussed whether he is using his Breztri. He is not using his Breztri. He is also not using his albuterol   . He is not using them because they make him cough. I suspect the medication hitting the back of his throat is stimulating a cough. We will try a spacer to see if this decreases the cough. We will give a duo neb today in the office.We will also get a CXR to ensure there is nothing else causing his shortness of breath. He does not have any swelling in the ankles.  He last had PFT's 10/2022, prior to his SBRT. If he is no better after taking his inhalers as prescribed, we will repeat PFT's , as he may have sone post radiation scarring.SABRA  He continues to smoke. He is  not likely to quit, but he has been counseled to do so.      Test Results: CXR 08/10/2024>> stat     CT Chest 06/24/2024 Mediastinum/Nodes: Visualized thyroid gland appears grossly unremarkable. No solid / cystic mediastinal masses. The esophagus is nondistended precluding optimal assessment. No axillary, mediastinal or hilar lymphadenopathy by size criteria.   Lungs/Pleura: The central tracheo-bronchial tree is patent. Small amount of mucus secretion noted in the right mainstem bronchus.   Mild-to-moderate  upper lobe predominant centrilobular emphysema noted. Redemonstration of linear area of scarring/staple line in the right upper lobe, which appears essentially similar to the prior study. There is a fiducial marker in this region, unchanged. Findings favor posttreatment changes without significant interval change. No new mass or consolidation. No pleural effusion or pneumothorax. No new or suspicious lung nodules.   Upper Abdomen: Visualized upper abdominal viscera within normal limits.   Musculoskeletal: Asymmetric mild right and moderate left gynecomastia noted. The visualized soft tissues of the chest wall are grossly unremarkable. No suspicious osseous lesions. There are moderate multilevel degenerative changes in the visualized spine.   IMPRESSION: 1. Stable exam. No new or suspicious lung mass, consolidation or lung nodule seen. 2. Multiple other nonacute observations, as described above.    Latest Ref Rng & Units 10/27/2023    8:38 AM 12/02/2022   10:36 AM 11/18/2022    6:58 AM  CBC  WBC 3.4 - 10.8 x10E3/uL 6.9  7.3  7.2   Hemoglobin 13.0 - 17.7 g/dL 85.5  85.4  85.2   Hematocrit 37.5 - 51.0 % 42.3  41.4  42.9   Platelets 150 - 450 x10E3/uL 224  259  211        Latest Ref Rng & Units 06/24/2024   12:36 PM 10/27/2023    8:38 AM 07/09/2023    4:04 PM  BMP  Glucose 70 - 99 mg/dL  884    BUN 8 - 27 mg/dL  15    Creatinine 9.38 - 1.24 mg/dL 8.89  8.97  8.99   BUN/Creat Ratio 10 - 24  15    Sodium 134 - 144 mmol/L  137    Potassium 3.5 - 5.2 mmol/L  4.6    Chloride 96 - 106 mmol/L  101    CO2 20 - 29 mmol/L  23    Calcium  8.6 - 10.2 mg/dL  9.2      BNP No results found for: BNP  ProBNP No results found for: PROBNP  PFT    Component Value Date/Time   FEV1PRE 1.87 01/03/2023 1007   FEV1POST 2.03 01/03/2023 1007   FVCPRE 2.93 01/03/2023 1007   FVCPOST 3.23 01/03/2023 1007   TLC 6.73 01/03/2023 1007   DLCOUNC 15.58 01/03/2023 1007   PREFEV1FVCRT 64 01/03/2023  1007   PSTFEV1FVCRT 63 01/03/2023 1007    CUP PACEART REMOTE DEVICE CHECK Result Date: 07/15/2024 ILR summary report received. Battery status OK. Normal device function. No new symptom, tachy, brady, or pause episodes. No new AF episodes. 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   GERD (gastroesophageal reflux disease)    Glaucoma 1980  H/O cerebral aneurysm repair 2016   put stent in   High cholesterol    History of blood transfusion ~ 1950   while in hospital w/pneumonia (01/09/2018)   History of kidney stones    Hypertension    Nonruptured cerebral aneurysm    Pneumonia ~ 1950   Red man syndrome    Seizures (HCC) 12/27/2015; 12/28/2015   Skin cancer 2014   Stroke (HCC) 12/2015 & 10/2022   denies residual on 01/09/2018)     Social History   Tobacco Use   Smoking status: Every Day    Average packs/day: 1 pack/day for 61.0 years (61.0 ttl pk-yrs)    Types: Cigarettes    Start date: 1958   Smokeless tobacco: Never   Tobacco comments:    1 pack a day 08/10/2024 KRd  Vaping Use   Vaping status: Never Used  Substance Use Topics   Alcohol use: No   Drug use: No    Mr.Lessig reports that he has been smoking cigarettes. He started smoking about 67 years ago. He has a 61 pack-year smoking history. He has never used smokeless tobacco. He reports that he does not drink alcohol and does not use drugs.  Tobacco Cessation: Ready to quit: Not Answered Counseling given: Not Answered Tobacco comments: 1 pack a day 08/10/2024 KRd Current every day smoker. He is not contemplating quitting.  Past surgical hx, Family hx, Social hx all reviewed.  Current Outpatient Medications on  File Prior to Visit  Medication Sig   albuterol  (VENTOLIN  HFA) 108 (90 Base) MCG/ACT inhaler Inhale 1-2 puffs into the lungs every 6 (six) hours as needed for wheezing or shortness of breath.   amLODipine  (NORVASC ) 5 MG tablet Take 1 tablet (5 mg total) by mouth daily.   Ascorbic Acid  (VITAMIN C ) 1000 MG tablet Take 1,000 mg by mouth daily.   atorvastatin  (LIPITOR ) 80 MG tablet Take 1 tablet (80 mg total) by mouth at bedtime.   Brinzolamide -Brimonidine  (SIMBRINZA) 1-0.2 % SUSP Place 1 drop into the right eye 2 (two) times daily.   budesonide-glycopyrrolate -formoterol (BREZTRI AEROSPHERE) 160-9-4.8 MCG/ACT AERO inhaler Inhale 2 puffs into the lungs in the morning and at bedtime.   calcium  carbonate (OSCAL) 1500 (600 Ca) MG TABS tablet Take 600 mg of elemental calcium  by mouth daily with breakfast.   Cholecalciferol  125 MCG (5000 UT) TABS Take 5,000 Units by mouth daily.   clopidogrel  (PLAVIX ) 75 MG tablet Take 1 tablet (75 mg total) by mouth daily.   finasteride  (PROSCAR ) 5 MG tablet Take 1 tablet (5 mg total) by mouth daily.   fluticasone  (FLONASE ) 50 MCG/ACT nasal spray Place 2 sprays into both nostrils daily.   levETIRAcetam  (KEPPRA ) 500 MG tablet Take 1 and 1/2 tablets twice a day   Multiple Vitamin (MULTIVITAMIN) capsule Take 1 capsule by mouth daily. 50+   multivitamin-lutein  (OCUVITE-LUTEIN ) CAPS capsule Take 1 capsule by mouth daily.   prednisoLONE  acetate (PRED FORTE ) 1 % ophthalmic suspension Place 1 drop into the left eye in the morning.   senna-docusate (SENOKOT-S) 8.6-50 MG tablet Take 2 tablets by mouth at bedtime as needed for mild constipation.   tamsulosin  (FLOMAX ) 0.4 MG CAPS capsule Take 1 capsule (0.4 mg total) by mouth daily after supper.   vitamin E  400 UNIT capsule Take 400 Units by mouth daily.   No current facility-administered medications on file prior to visit.     Allergies  Allergen Reactions   Cephalosporins Rash    Full body rash with systemic symptoms  Vancomycin  Rash    Full body rash with systemic symptoms    Review Of Systems:  Constitutional:   No  weight loss, night sweats,  Fevers, chills, fatigue, or  lassitude.  HEENT:   No headaches,  Difficulty swallowing,  Tooth/dental problems, or  Sore throat,                No sneezing, itching, ear ache, nasal congestion, post nasal drip,   CV:  No chest pain,  Orthopnea, PND, swelling in lower extremities, anasarca, dizziness, palpitations, syncope.   GI  No heartburn, indigestion, abdominal pain, nausea, vomiting, diarrhea, change in bowel habits, loss of appetite, bloody stools.   Resp: + shortness of breath with exertion less at rest.  + minimal  excess mucus, No  productive cough,  + non-productive cough,  No coughing up of blood.  No change in color of mucus.  + wheezing.  No chest wall deformity  Skin: no rash or lesions.  GU: no dysuria, change in color of urine, no urgency or frequency.  No flank pain, no hematuria   MS:  No joint pain or swelling.  No decreased range of motion.  No back pain.  Psych:  No change in mood or affect. No depression or anxiety.  No memory loss.   Vital Signs BP 119/67   Pulse 75   Temp (!) 97.5 F (36.4 C) (Oral)   Ht 5' 9 (1.753 m)   Wt 194 lb (88 kg)   SpO2 98%   BMI 28.65 kg/m    Physical Exam:  General- No distress,  A&Ox3, pleasant, audible wheezing ENT: No sinus tenderness, TM clear, pale nasal mucosa, no oral exudate,no post nasal drip, no LAN Cardiac: S1, S2, regular rate and rhythm, no murmur Chest: + upper airway wheeze/ No rales/ dullness; no accessory muscle use, no nasal flaring, no sternal retractions Abd.: Soft Non-tender, ND, BS +, Body mass index is 28.65 kg/m.  Ext: No clubbing cyanosis, edema, no obvious deformities Neuro:  physical deconditioning , MAE x 4, A&O x 3, walks with cane Skin: No rashes, warm and dry, no obvious skin lesions  Psych: normal mood and behavior  Physical Exam     Assessment/Plan Slow to resolve COPD Flare>> treated with Doxy and pred taper 06/2024. Not using maintenance or rescue inhaler Current every day smoker  History of adenocarcinoma of the lung, treated 12/2022 with SBRT Plan We will call you with the results. We will order a spacer for you to use with the Breztri and albuterol  inhalers. DuoNeb  today in the office.  Continue Breztri 2 puffs in the morning and 2 puffs in the evening. Use with a spacer ( which we will order today)  Rinse mouth after use.  Continue using albuterol  as needed for shortness of breath or wheezing.  Follow up in 2 weeks to see how you doing.  We will consider PFT's at follow up.  I will send in a prednisone  taper to see if this is what helped your breathing Prednisone  taper; 10 mg tablets: 4 tabs x 2 days, 3 tabs x 2 days, 2 tabs x 2 days 1 tab x 2 days then stop.  This can increase your blood sugar , and needs to be taken at breakfast after food.  Please contact office for sooner follow up if symptoms do not improve or worsen or seek emergency care     I spent 45 minutes dedicated to the care of this patient on the  date of this encounter to include pre-visit review of records, face-to-face time with the patient discussing conditions above, post visit ordering of testing, clinical documentation with the electronic health record, making appropriate referrals as documented, and communicating necessary information to the patient's healthcare team.   Assessment & Plan   Damon JULIANNA Lites, NP 08/10/2024  12:00 PM

## 2024-08-10 NOTE — Patient Instructions (Addendum)
 We will do a CXR today to ensure there is no infection or fluid  in your lungs.  We will call you with the results. We will order a spacer for you to use with the Breztri and albuterol  inhalers. DuoNeb  today in the office.  Continue Breztri 2 puffs in the morning and 2 puffs in the evening. Use with a spacer ( which we will order today)  Rinse mouth after use.  Continue using albuterol  as needed for shortness of breath or wheezing.  Follow up in 2 weeks to see how you doing.  We will consider PFT's at follow up.  I will send in a prednisone  taper to see if this is what helped your breathing Prednisone  taper; 10 mg tablets: 4 tabs x 2 days, 3 tabs x 2 days, 2 tabs x 2 days 1 tab x 2 days then stop.  This can increase your blood sugar , and needs to be taken at breakfast after food.  Please contact office for sooner follow up if symptoms do not improve or worsen or seek emergency care

## 2024-08-11 ENCOUNTER — Telehealth: Payer: Self-pay | Admitting: Acute Care

## 2024-08-11 NOTE — Telephone Encounter (Signed)
 Called and spoke with patient wife per DPR relayed results,verbalized understanding.NFN

## 2024-08-11 NOTE — Telephone Encounter (Signed)
 Please call and let him know the CXR was clear, but we would like him to continue with the plan of care we developed yesterday. Thanks

## 2024-08-14 ENCOUNTER — Encounter

## 2024-08-15 ENCOUNTER — Ambulatory Visit

## 2024-08-16 ENCOUNTER — Encounter

## 2024-08-18 ENCOUNTER — Ambulatory Visit

## 2024-08-18 DIAGNOSIS — I639 Cerebral infarction, unspecified: Secondary | ICD-10-CM | POA: Diagnosis not present

## 2024-08-18 LAB — CUP PACEART REMOTE DEVICE CHECK
Date Time Interrogation Session: 20251125232348
Implantable Pulse Generator Implant Date: 20240223

## 2024-08-19 ENCOUNTER — Other Ambulatory Visit: Payer: Self-pay

## 2024-08-19 ENCOUNTER — Emergency Department (HOSPITAL_COMMUNITY)

## 2024-08-19 ENCOUNTER — Emergency Department (HOSPITAL_COMMUNITY)
Admission: EM | Admit: 2024-08-19 | Discharge: 2024-08-19 | Disposition: A | Discharge status: Home / Self Care | Attending: Emergency Medicine | Admitting: Emergency Medicine

## 2024-08-19 DIAGNOSIS — S065XAA Traumatic subdural hemorrhage with loss of consciousness status unknown, initial encounter: Secondary | ICD-10-CM

## 2024-08-19 DIAGNOSIS — Z950 Presence of cardiac pacemaker: Secondary | ICD-10-CM | POA: Insufficient documentation

## 2024-08-19 DIAGNOSIS — R739 Hyperglycemia, unspecified: Secondary | ICD-10-CM | POA: Insufficient documentation

## 2024-08-19 DIAGNOSIS — S022XXA Fracture of nasal bones, initial encounter for closed fracture: Secondary | ICD-10-CM | POA: Diagnosis not present

## 2024-08-19 DIAGNOSIS — S62011A Displaced fracture of distal pole of navicular [scaphoid] bone of right wrist, initial encounter for closed fracture: Secondary | ICD-10-CM | POA: Insufficient documentation

## 2024-08-19 DIAGNOSIS — W130XXA Fall from, out of or through balcony, initial encounter: Secondary | ICD-10-CM | POA: Insufficient documentation

## 2024-08-19 DIAGNOSIS — Z23 Encounter for immunization: Secondary | ICD-10-CM | POA: Diagnosis not present

## 2024-08-19 DIAGNOSIS — S065X0A Traumatic subdural hemorrhage without loss of consciousness, initial encounter: Secondary | ICD-10-CM | POA: Diagnosis not present

## 2024-08-19 DIAGNOSIS — Y92008 Other place in unspecified non-institutional (private) residence as the place of occurrence of the external cause: Secondary | ICD-10-CM | POA: Diagnosis not present

## 2024-08-19 DIAGNOSIS — S62619A Displaced fracture of proximal phalanx of unspecified finger, initial encounter for closed fracture: Secondary | ICD-10-CM

## 2024-08-19 DIAGNOSIS — Z7902 Long term (current) use of antithrombotics/antiplatelets: Secondary | ICD-10-CM | POA: Diagnosis not present

## 2024-08-19 DIAGNOSIS — S62612A Displaced fracture of proximal phalanx of right middle finger, initial encounter for closed fracture: Secondary | ICD-10-CM | POA: Diagnosis not present

## 2024-08-19 DIAGNOSIS — M1712 Unilateral primary osteoarthritis, left knee: Secondary | ICD-10-CM | POA: Insufficient documentation

## 2024-08-19 DIAGNOSIS — S80212A Abrasion, left knee, initial encounter: Secondary | ICD-10-CM | POA: Insufficient documentation

## 2024-08-19 DIAGNOSIS — W108XXA Fall (on) (from) other stairs and steps, initial encounter: Secondary | ICD-10-CM | POA: Diagnosis not present

## 2024-08-19 DIAGNOSIS — M79641 Pain in right hand: Secondary | ICD-10-CM | POA: Diagnosis present

## 2024-08-19 DIAGNOSIS — S6991XA Unspecified injury of right wrist, hand and finger(s), initial encounter: Secondary | ICD-10-CM | POA: Diagnosis present

## 2024-08-19 LAB — COMPREHENSIVE METABOLIC PANEL WITH GFR
ALT: 35 U/L (ref 0–44)
AST: 29 U/L (ref 15–41)
Albumin: 4.1 g/dL (ref 3.5–5.0)
Alkaline Phosphatase: 114 U/L (ref 38–126)
Anion gap: 12 (ref 5–15)
BUN: 18 mg/dL (ref 8–23)
CO2: 25 mmol/L (ref 22–32)
Calcium: 9.1 mg/dL (ref 8.9–10.3)
Chloride: 99 mmol/L (ref 98–111)
Creatinine, Ser: 1 mg/dL (ref 0.61–1.24)
GFR, Estimated: >60 mL/min (ref >60)
Glucose, Bld: 184 mg/dL — ABNORMAL HIGH (ref 70–99)
Potassium: 3.4 mmol/L — ABNORMAL LOW (ref 3.5–5.1)
Sodium: 136 mmol/L (ref 135–145)
Total Bilirubin: 0.3 mg/dL (ref 0.0–1.2)
Total Protein: 6.6 g/dL (ref 6.5–8.1)

## 2024-08-19 LAB — CBC WITH DIFFERENTIAL/PLATELET
Abs Immature Granulocytes: 0.08 K/uL — ABNORMAL HIGH (ref 0.00–0.07)
Basophils Absolute: 0 K/uL (ref 0.0–0.1)
Basophils Relative: 0 %
Eosinophils Absolute: 0.1 K/uL (ref 0.0–0.5)
Eosinophils Relative: 1 %
HCT: 46 % (ref 39.0–52.0)
Hemoglobin: 15.8 g/dL (ref 13.0–17.0)
Immature Granulocytes: 1 %
Lymphocytes Relative: 31 %
Lymphs Abs: 3.4 K/uL (ref 0.7–4.0)
MCH: 34.3 pg — ABNORMAL HIGH (ref 26.0–34.0)
MCHC: 34.3 g/dL (ref 30.0–36.0)
MCV: 99.8 fL (ref 80.0–100.0)
Monocytes Absolute: 0.8 K/uL (ref 0.1–1.0)
Monocytes Relative: 8 %
Neutro Abs: 6.3 K/uL (ref 1.7–7.7)
Neutrophils Relative %: 59 %
Platelets: 249 K/uL (ref 150–400)
RBC: 4.61 MIL/uL (ref 4.22–5.81)
RDW: 12.9 % (ref 11.5–15.5)
WBC: 10.7 K/uL — ABNORMAL HIGH (ref 4.0–10.5)
nRBC: 0 % (ref 0.0–0.2)

## 2024-08-19 MED ORDER — BACITRACIN ZINC 500 UNIT/GM EX OINT: 1.0000 | TOPICAL_OINTMENT | Freq: Two times a day (BID) | CUTANEOUS | 0 refills | Status: AC

## 2024-08-19 MED ORDER — FENTANYL CITRATE (PF) 100 MCG/2ML IJ SOLN
50.0000 ug | Freq: Once | INTRAMUSCULAR | Status: AC
  Administered 2024-08-19: 50 ug via INTRAVENOUS
  Filled 2024-08-19: qty 2

## 2024-08-19 MED ORDER — DOXYCYCLINE HYCLATE 100 MG PO CAPS: 100.0000 mg | ORAL_CAPSULE | Freq: Two times a day (BID) | ORAL | 0 refills | Status: AC

## 2024-08-19 MED ORDER — BACITRACIN ZINC 500 UNIT/GM EX OINT
TOPICAL_OINTMENT | Freq: Two times a day (BID) | CUTANEOUS | Status: DC
  Administered 2024-08-19: 1 via TOPICAL
  Filled 2024-08-19: qty 2.7

## 2024-08-19 MED ORDER — TETANUS-DIPHTH-ACELL PERTUSSIS 5-2-15.5 LF-MCG/0.5 IM SUSP
0.5000 mL | Freq: Once | INTRAMUSCULAR | Status: AC
  Administered 2024-08-19: 0.5 mL via INTRAMUSCULAR
  Filled 2024-08-19: qty 0.5

## 2024-08-19 MED ORDER — OXYCODONE-ACETAMINOPHEN 5-325 MG PO TABS
1.0000 | ORAL_TABLET | Freq: Four times a day (QID) | ORAL | 0 refills | Status: AC | PRN
Start: 2024-08-19 — End: ?

## 2024-08-19 NOTE — Discharge Instructions (Addendum)
 The x-rays show that you do have a broken finger of the right hand, I have placed you in a splint will help both your wrist and your finger but please avoid using the right hand.  It also shows that you have a broken nose, there is nothing you need to do for this.  You do need to keep a topical antibiotic on all of your open wounds, twice a day, change the dressings twice a day, take doxycycline  twice a day for 7 days to help prevent infection.  I also prescribed some oxycodone  which you can take 1 tablet every 6 hours only for severe pain, bacitracin  topically.  Thank you for allowing us  to treat you in the emergency department today.  After reviewing your examination and potential testing that was done it appears that you are safe to go home.  I would like for you to follow-up with your doctor within the next several days, have them obtain your records and follow-up with them to review all potential tests and results from your visit.  If you should develop severe or worsening symptoms return to the emergency department immediately  Most importantly you need to see the hand surgeon, call tomorrow morning for the next available appointment to be seen early next week.

## 2024-08-19 NOTE — ED Notes (Signed)
 Patients wife given wrist watch.

## 2024-08-19 NOTE — ED Triage Notes (Signed)
 Pt arrived [facility] from home with c/o fall and a laceration to his face. Nose and forehead laceration. Bleeding controled. Pt fell walking down porch and hit asphalt. Pt is on plavix .

## 2024-08-19 NOTE — ED Notes (Signed)
 Patients wounds dressed, on his face. Bacitracin  applied with gauze. Wounds cleaned with normal saline.

## 2024-08-19 NOTE — ED Provider Notes (Signed)
 [place] EMERGENCY DEPARTMENT AT [facility] Provider Note   CSN: [id number] Arrival date & time: 08/19/24  1437     Patient presents with: Fall and Laceration   Damon Parrish is a 83 y.o. male.    Fall  Laceration  This patient is a 83 year old male presenting by ambulance after he was involved in an accident where he fell down the step of his front porch onto the concrete striking his head trying to catch himself with his hands, he suffered a laceration to the back of his right hand his forehead his nose is upper lip and his lower lip.  This was dressed prehospital, he denies loss of consciousness and states he has no neck pain back pain chest pain or shortness of breath.  The patient has some pain in his left knee and his right hand.  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  He attributes this to losing his balance as he walks with a cane    Prior to Admission medications   Medication Sig Start Date End Date Taking? Authorizing Provider  bacitracin  ointment Apply 1 Application topically 2 (two) times daily. 08/19/24  Yes Cleotilde Rogue, MD  doxycycline  (VIBRAMYCIN ) 100 MG capsule Take 1 capsule (100 mg total) by mouth 2 (two) times daily. 08/19/24  Yes Cleotilde Rogue, MD  oxyCODONE -acetaminophen  (PERCOCET/ROXICET) 5-325 MG tablet Take 1 tablet by mouth every 6 (six) hours as needed for severe pain (pain score 7-10). 08/19/24  Yes Cleotilde Rogue, MD  albuterol  (VENTOLIN  HFA) 108 [phone] Base) MCG/ACT inhaler Inhale 1-2 puffs into the lungs every 6 (six) hours as needed for wheezing or shortness of breath. 07/09/24   Ruthell Lauraine FALCON, NP  amLODipine  (NORVASC ) 5 MG tablet Take 1 tablet (5 mg total) by mouth daily. 11/03/23   Melvenia Manus BRAVO, MD  Ascorbic Acid  (VITAMIN C ) 1000 MG tablet Take 1,000 mg by mouth daily.    [provider]  atorvastatin  (LIPITOR ) 80 MG tablet Take 1 tablet (80 mg  total) by mouth at bedtime. 11/03/23 08/10/24  Melvenia Manus BRAVO, MD  Brinzolamide -Brimonidine  [facility]) 1-0.2 % SUSP Place 1 drop into the right eye 2 (two) times daily.    [provider]  budesonide-glycopyrrolate -formoterol (BREZTRI  AEROSPHERE) 160-9-4.8 MCG/ACT AERO inhaler Inhale 2 puffs into the lungs in the morning and at bedtime. 07/09/24   Ruthell Lauraine FALCON, NP  budesonide-glycopyrrolate -formoterol (BREZTRI  AEROSPHERE) 160-9-4.8 MCG/ACT AERO inhaler Inhale 2 puffs into the lungs in the morning and at bedtime. 08/10/24   Ruthell Lauraine FALCON, NP  calcium  carbonate (OSCAL) 1500 (600 Ca) MG TABS tablet Take 600 mg of elemental calcium  by mouth daily with breakfast.    [provider]  Cholecalciferol  125 MCG (5000 UT) TABS Take 5,000 Units by mouth daily.    [provider]  clopidogrel  (PLAVIX ) 75 MG tablet Take 1 tablet (75 mg total) by mouth daily. 10/23/23   Georjean Darice HERO, MD  finasteride  (PROSCAR ) 5 MG tablet Take 1 tablet (5 mg total) by mouth daily. 11/03/23   Melvenia Manus BRAVO, MD  fluticasone  (FLONASE ) 50 MCG/ACT nasal spray Place 2 sprays into both nostrils daily. 12/19/17   [provider]  levETIRAcetam  (KEPPRA ) 500 MG tablet Take 1 and 1/2 tablets twice a day 05/18/24   Georjean Darice HERO, MD  Multiple Vitamin (MULTIVITAMIN) capsule Take 1 capsule by mouth daily. 50+    [provider]  multivitamin-lutein  (OCUVITE-LUTEIN ) CAPS capsule Take 1 capsule by mouth daily.    [provider]  prednisoLONE  acetate (PRED FORTE ) 1 % ophthalmic suspension Place 1 drop into the left eye in the morning. 07/16/22   [provider]  predniSONE  (DELTASONE ) 10 MG tablet Prednisone  taper; 10 mg tablets: 4 tabs x 2 days, 3 tabs x 2 days, 2 tabs x 2 days 1 tab x 2 days then stop. 08/10/24   Ruthell Lauraine FALCON, NP  senna-docusate (SENOKOT-S) 8.6-50 MG tablet Take 2 tablets by mouth at bedtime as needed for mild constipation. 11/21/22   Setzer, Nena PARAS, PA-C   Spacer/Aero-Holding Chambers (AEROCHAMBER PLUS) Device Use with Breztri  inhaler and albuterol  inhaler 08/10/24   Ruthell Lauraine FALCON, NP  tamsulosin  (FLOMAX ) 0.4 MG CAPS capsule Take 1 capsule (0.4 mg total) by mouth daily after supper. 06/17/24   Chandra Toribio POUR, MD  vitamin E  400 UNIT capsule Take 400 Units by mouth daily.    [provider]    Allergies: Cephalosporins and Vancomycin     Review of Systems  All other systems reviewed and are negative.   Updated Vital Signs BP (!) 149/85   Pulse 80   Temp 97.9 F (36.6 C) (Temporal)   Resp 18   Ht 1.753 m (5' 9)   Wt 87.9 kg   SpO2 93%   BMI 28.62 kg/m   Physical Exam Vitals and nursing note reviewed.  Constitutional:      General: He is not in acute distress.    Appearance: He is well-developed.  HENT:     Head: Normocephalic.     Comments: Skin abrasions to the mid forehead over the bridge of the nose the tip of the nose the mid upper lip, the lower lip, there is a laceration on the inside of the lip that is subcentimeter, dentition is all intact and there is no trismus or torticollis.    Mouth/Throat:     Pharynx: No oropharyngeal exudate.  Eyes:     General: No scleral icterus.       Right eye: No discharge.        Left eye: No discharge.     Conjunctiva/sclera: Conjunctivae normal.     Pupils: Pupils are equal, round, and reactive to light.  Neck:     Thyroid: No thyromegaly.     Vascular: No JVD.  Cardiovascular:     Rate and Rhythm: Normal rate and regular rhythm.     Heart sounds: Normal heart sounds. No murmur heard.    No friction rub. No gallop.  Pulmonary:     Effort: Pulmonary effort is normal. No respiratory distress.     Breath sounds: Normal breath sounds. No wheezing or rales.  Chest:     Chest wall: No tenderness.  Abdominal:     General: Bowel sounds are normal. There is no distension.     Palpations: Abdomen is soft. There is no mass.     Tenderness: There is no abdominal tenderness.   Musculoskeletal:        General: Swelling and tenderness present. Normal range of motion.     Cervical back: Normal range of motion and neck supple.  Lymphadenopathy:     Cervical: No cervical adenopathy.  Skin:    General: Skin is warm and dry.     Findings: Bruising present. No erythema or rash.     Comments: There are skin tears to the dorsum of the right hand, abrasions as noted above, tenderness with  range of motion of the left knee and the right hand  Neurological:     Mental Status: He is alert.     Coordination: Coordination normal.     Comments: The patient is awake and alert and able to follow commands without difficulty.  His spouse states this is his baseline  Psychiatric:        Behavior: Behavior normal.     (all labs ordered are listed, but only abnormal results are displayed) Labs Reviewed  CBC WITH DIFFERENTIAL/PLATELET - Abnormal; Notable for the following components:      Result Value   WBC 10.7 (*)    MCH 34.3 (*)    Abs Immature Granulocytes 0.08 (*)    All other components within normal limits  COMPREHENSIVE METABOLIC PANEL WITH GFR - Abnormal; Notable for the following components:   Potassium 3.4 (*)    Glucose, Bld 184 (*)    All other components within normal limits    EKG: None  Radiology: [facility] Chest Port 1 View Result Date: 08/19/2024 CLINICAL DATA:  [place] EXAM: PORTABLE CHEST 1 VIEW COMPARISON:  08/10/2024 FINDINGS: Single frontal view of the chest demonstrates stable loop recorder. Cardiac silhouette is unremarkable. Lung volumes are diminished, with crowding of the pulmonary vasculature. No airspace disease, effusion, or pneumothorax. No acute bony abnormalities. IMPRESSION: 1. Low lung volumes.  No acute intrathoracic process. Electronically Signed   By: Ozell Daring M.D.   On: 08/19/2024 16:28   DG Knee Complete 4 Views Left Result Date: 08/19/2024 CLINICAL DATA:  Clemens, laceration EXAM: LEFT KNEE - COMPLETE 4+ VIEW COMPARISON:  None  Available. FINDINGS: Frontal, bilateral oblique, and cross-table lateral views of the left knee are obtained. No acute displaced fracture, subluxation, or dislocation. 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 Small suprapatellar joint effusion. Prepatellar soft tissue swelling. IMPRESSION: 1. No acute displaced fracture. 2. Prepatellar soft tissue swelling. 3. Mild 3 compartmental osteoarthritis and likely reactive joint effusion. Electronically Signed   By: Ozell Daring M.D.   On: 08/19/2024 16:27   DG Hand Complete Right Result Date: 08/19/2024 CLINICAL DATA:  [place] EXAM: RIGHT HAND - COMPLETE 3+ VIEW COMPARISON:  None Available. FINDINGS: Frontal, oblique, and lateral views of the right hand are obtained. There is a transverse extra-articular fracture at the base of the fourth proximal phalanx, with impaction and slight ventral angulation at the fracture site. There are no other acute displaced fractures. 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 There is diffuse soft tissue swelling, greatest at the level of the metacarpophalangeal joints. IMPRESSION: 1. Acute transverse extra-articular fracture at the base of the fourth proximal phalanx, with slight impaction and ventral angulation at the fracture site. 2. Diffuse soft tissue swelling throughout the hand, greatest at the level of the metacarpophalangeal joints. 3. Chronic posttraumatic changes and degenerative changes within the radial aspect of the wrist. Electronically Signed   By: Ozell Daring M.D.   On: 08/19/2024 16:26   DG Wrist Complete Right Result Date: 08/19/2024 CLINICAL DATA:  Status post fall EXAM: RIGHT WRIST - COMPLETE 4 VIEW COMPARISON:  None Available. FINDINGS: Comminuted fracture of the distal scaphoid. Degenerative changes of the thumb carpometacarpal and STT joints. Soft tissues are unremarkable. IMPRESSION:  Comminuted fracture of the distal scaphoid. The fracture fragments appear well corticated, suggesting chronicity. Electronically Signed   By: Limin  Xu M.D.   On: 08/19/2024 16:26   CT Head Wo  Contrast Result Date: 08/19/2024 CLINICAL DATA:  Fall. Laceration to the face, specifically nose and forehead. Patient fell down porch and hit asphalt. Patient is on Plavix . EXAM: CT HEAD WITHOUT CONTRAST CT MAXILLOFACIAL WITHOUT CONTRAST CT CERVICAL SPINE WITHOUT CONTRAST TECHNIQUE: Multidetector CT imaging of the head, cervical spine, and maxillofacial structures were performed using the standard protocol without intravenous contrast. Multiplanar CT image reconstructions of the cervical spine and maxillofacial structures were also generated. RADIATION DOSE REDUCTION: This exam was performed according to the departmental dose-optimization program which includes automated exposure control, adjustment of the mA and/or kV according to patient size and/or use of iterative reconstruction technique. COMPARISON:  Head CT, 11/13/2022.  Brain MRI, 11/18/2023. FINDINGS: CT HEAD FINDINGS Brain: Small focus of subdural hemorrhage along the left tentorium. No other evidence of acute intracranial hemorrhage. No mass or mass effect. No evidence of an acute infarction. No hydrocephalus. 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 Vascular: No hyperdense vessel or unexpected calcification. Skull: Normal. Negative for fracture or focal lesion. Other: Small mid forehead soft tissue hematoma. CT MAXILLOFACIAL FINDINGS Osseous: Nondisplaced fracture of the nasal bone. No other fractures.  No bone lesions. Orbits: No injury to either globe or orbit. Sinuses: Mild inferior frontal and anterior ethmoid sinus mucosal thickening. Mild left and minor right maxillary sinus mucosal thickening. Minimal dependent fluid in the left maxillary sinus. Soft tissues: Mid forehead soft tissue hematoma. CT  CERVICAL SPINE FINDINGS Alignment: Normal. Skull base and vertebrae: No acute fracture. No primary bone lesion or focal pathologic process. Soft tissues and spinal canal: No prevertebral fluid or swelling. No visible canal hematoma. Disc levels: There are significant degenerative changes with loss of disc height, mild disc bulging and endplate spurring as well as facet degenerative changes throughout most of the cervical spine. Facet degenerative changes greatest on the left at C3-C4. No convincing disc herniation. Upper chest: No acute finding. Upper lobe lung scarring and emphysema. Other: None. IMPRESSION: HEAD CT 1. Small focus of subdural hemorrhage along the left tentorium. 2. No other acute intracranial abnormality. MAXILLOFACIAL CT 1. Nondisplaced nasal fracture.  No other fractures. 2. Low forehead scalp contusion/hematoma. CERVICAL CT 1. No fracture or acute finding. Electronically Signed   By: Alm Parkins M.D.   On: 08/19/2024 16:16   CT Maxillofacial Wo Contrast Result Date: 08/19/2024 CLINICAL DATA:  Fall. Laceration to the face, specifically nose and forehead. Patient fell down porch and hit asphalt. Patient is on Plavix . EXAM: CT HEAD WITHOUT CONTRAST CT MAXILLOFACIAL WITHOUT CONTRAST CT CERVICAL SPINE WITHOUT CONTRAST TECHNIQUE: Multidetector CT imaging of the head, cervical spine, and maxillofacial structures were performed using the standard protocol without intravenous contrast. Multiplanar CT image reconstructions of the cervical spine and maxillofacial structures were also generated. RADIATION DOSE REDUCTION: This exam was performed according to the departmental dose-optimization program which includes automated exposure control, adjustment of the mA and/or kV according to patient size and/or use of iterative reconstruction technique. COMPARISON:  Head CT, 11/13/2022.  Brain MRI, 11/18/2023. FINDINGS: CT HEAD FINDINGS Brain: Small focus of subdural hemorrhage along the left tentorium. No  other evidence of acute intracranial hemorrhage. No mass or mass effect. No evidence of an acute infarction. No hydrocephalus. 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 Vascular: No hyperdense vessel or unexpected calcification. Skull: Normal. Negative for fracture or focal lesion. Other: Small mid forehead soft tissue hematoma. CT MAXILLOFACIAL FINDINGS Osseous: Nondisplaced fracture of the nasal  bone. No other fractures.  No bone lesions. Orbits: No injury to either globe or orbit. Sinuses: Mild inferior frontal and anterior ethmoid sinus mucosal thickening. Mild left and minor right maxillary sinus mucosal thickening. Minimal dependent fluid in the left maxillary sinus. Soft tissues: Mid forehead soft tissue hematoma. CT CERVICAL SPINE FINDINGS Alignment: Normal. Skull base and vertebrae: No acute fracture. No primary bone lesion or focal pathologic process. Soft tissues and spinal canal: No prevertebral fluid or swelling. No visible canal hematoma. Disc levels: There are significant degenerative changes with loss of disc height, mild disc bulging and endplate spurring as well as facet degenerative changes throughout most of the cervical spine. Facet degenerative changes greatest on the left at C3-C4. No convincing disc herniation. Upper chest: No acute finding. Upper lobe lung scarring and emphysema. Other: None. IMPRESSION: HEAD CT 1. Small focus of subdural hemorrhage along the left tentorium. 2. No other acute intracranial abnormality. MAXILLOFACIAL CT 1. Nondisplaced nasal fracture.  No other fractures. 2. Low forehead scalp contusion/hematoma. CERVICAL CT 1. No fracture or acute finding. Electronically Signed   By: Alm Parkins M.D.   On: 08/19/2024 16:16   CT Cervical Spine Wo Contrast Result Date: 08/19/2024 CLINICAL DATA:  Fall. Laceration to the face, specifically nose and forehead. Patient fell down porch and hit asphalt. Patient is on Plavix . EXAM:  CT HEAD WITHOUT CONTRAST CT MAXILLOFACIAL WITHOUT CONTRAST CT CERVICAL SPINE WITHOUT CONTRAST TECHNIQUE: Multidetector CT imaging of the head, cervical spine, and maxillofacial structures were performed using the standard protocol without intravenous contrast. Multiplanar CT image reconstructions of the cervical spine and maxillofacial structures were also generated. RADIATION DOSE REDUCTION: This exam was performed according to the departmental dose-optimization program which includes automated exposure control, adjustment of the mA and/or kV according to patient size and/or use of iterative reconstruction technique. COMPARISON:  Head CT, 11/13/2022.  Brain MRI, 11/18/2023. FINDINGS: CT HEAD FINDINGS Brain: Small focus of subdural hemorrhage along the left tentorium. No other evidence of acute intracranial hemorrhage. No mass or mass effect. No evidence of an acute infarction. No hydrocephalus. 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 Vascular: No hyperdense vessel or unexpected calcification. Skull: Normal. Negative for fracture or focal lesion. Other: Small mid forehead soft tissue hematoma. CT MAXILLOFACIAL FINDINGS Osseous: Nondisplaced fracture of the nasal bone. No other fractures.  No bone lesions. Orbits: No injury to either globe or orbit. Sinuses: Mild inferior frontal and anterior ethmoid sinus mucosal thickening. Mild left and minor right maxillary sinus mucosal thickening. Minimal dependent fluid in the left maxillary sinus. Soft tissues: Mid forehead soft tissue hematoma. CT CERVICAL SPINE FINDINGS Alignment: Normal. Skull base and vertebrae: No acute fracture. No primary bone lesion or focal pathologic process. Soft tissues and spinal canal: No prevertebral fluid or swelling. No visible canal hematoma. Disc levels: There are significant degenerative changes with loss of disc height, mild disc bulging and endplate spurring as well as facet degenerative  changes throughout most of the cervical spine. Facet degenerative changes greatest on the left at C3-C4. No convincing disc herniation. Upper chest: No acute finding. Upper lobe lung scarring and emphysema. Other: None. IMPRESSION: HEAD CT 1. Small focus of subdural hemorrhage along the left tentorium. 2. No other acute intracranial abnormality. MAXILLOFACIAL CT 1. Nondisplaced nasal fracture.  No other fractures. 2. Low forehead scalp contusion/hematoma. CERVICAL CT 1. No fracture or acute finding. Electronically Signed   By: Alm Parkins M.D.   On: 08/19/2024 16:16  CUP PACEART REMOTE DEVICE CHECK Result Date: 08/18/2024 ILR summary report received. Battery status OK. Normal device function. No new symptom, tachy, brady, or pause episodes. No new AF episodes. Monthly summary reports and ROV/PRN - CS, CVRS    .Splint Application  Date/Time: 08/19/2024 5:41 PM  Performed by: Cleotilde Rogue, MD Authorized by: Cleotilde Rogue, MD   Consent:    Consent obtained:  Verbal   Consent given by:  Patient   Risks, benefits, and alternatives were discussed: yes     Risks discussed:  Discoloration, numbness, pain and swelling   Alternatives discussed:  No treatment and alternative treatment Universal protocol:    Procedure explained and questions answered to patient or proxy's satisfaction: yes     Immediately prior to procedure a time out was called: yes     Patient identity confirmed:  Verbally with patient Pre-procedure details:    Distal neurologic exam:  Normal   Distal perfusion: distal pulses strong and brisk capillary refill   Procedure details:    Location:  Wrist   Wrist location:  R wrist   Splint type:  Volar short arm   Supplies:  Cotton padding, fiberglass and sling   Attestation: Splint applied and adjusted personally by me   Post-procedure details:    Distal neurologic exam:  Normal   Distal perfusion: distal pulses strong and brisk capillary refill     Procedure completion:   Tolerated well, no immediate complications   Post-procedure imaging: not applicable   Comments:          Medications Ordered in the ED  bacitracin  ointment (1 Application Topical Given 08/19/24 1540)  Tdap (ADACEL ) injection 0.5 mL (0.5 mLs Intramuscular Given 08/19/24 1539)  fentaNYL  (SUBLIMAZE ) injection 50 mcg (50 mcg Intravenous Given 08/19/24 1538)                                    Medical Decision Making Amount and/or Complexity of Data Reviewed Labs: ordered. Radiology: ordered.  Risk OTC drugs. Prescription drug management.    This patient presents to the ED for concern of trauma differential diagnosis includes head injury, neck pain, maxillofacial fracture, right hand fracture, left knee fracture,    Additional history obtained   Additional history obtained from Electronic Medical Record External records from outside source obtained and reviewed including spouse has arrived and states that he has recently been sick with an upper respiratory illness including some coughing and feeling a little short of breath, will obtain an x-ray of the chest as well   Lab Tests:  I Ordered, and personally interpreted labs.  The pertinent results include: Unremarkable lab work other than mild hyperglycemia   Imaging Studies ordered:  I ordered imaging studies including x-rays of the right hand the left knee the right wrist the head cervical spine and maxillofacial bones I independently visualized and interpreted imaging which showed a bone fracture, small area of subdural hemorrhage, likely chronic scaphoid fracture I agree with the radiologist interpretation   Medicines ordered and prescription drug management:  I ordered medication including topical antibiotics    I have reviewed the patients home medicines and have made adjustments as needed   Problem List / ED Course:  Case discussed with Dr. Joshua of the neurosurgery service, he has looked at the CT scan with  me and recommends that this patient does not need to be admitted to the hospital, does not need  further imaging and in fact based on the very small size of the spot of bleeding and the location he can go home without the need for any follow-up unless his symptoms worsen.   Social Determinants of Health:   Anticoagulated  Placed in splint, follow-up with orthopedic hand surgeon, medications given for both pain and nausea for home as well as follow-up instructions, patient and family emerged are totally in agreement.  There is a son and a spouse at the bedside who are in agreement to help caring for him at home.  Wound care instructions given as well  Patient placed in a splint, I personally applied a volar splint which would help to some degree with both proximal phalanx fracture and the scaphoid fracture, placed in a sling and follow-up with orthopedics.  Patient does not recall having a prior scaphoid fracture, he is ambidextrous according to family     Final diagnoses:  Fracture of proximal phalanx of right hand, closed, initial encounter  Closed fracture of nasal bone, initial encounter  Subdural hematoma [facility])    ED Discharge Orders          Ordered    bacitracin  ointment  2 times daily        08/19/24 1739    oxyCODONE -acetaminophen  (PERCOCET/ROXICET) 5-325 MG tablet  Every 6 hours PRN        08/19/24 1739    doxycycline  (VIBRAMYCIN ) 100 MG capsule  2 times daily        08/19/24 1739               Cleotilde Rogue, MD 08/19/24 1742

## 2024-08-20 NOTE — Progress Notes (Signed)
 Remote Loop Recorder Transmission

## 2024-08-24 ENCOUNTER — Ambulatory Visit: Payer: Self-pay | Admitting: Cardiovascular Disease

## 2024-08-30 ENCOUNTER — Encounter

## 2024-08-31 ENCOUNTER — Ambulatory Visit: Admitting: Acute Care

## 2024-09-14 ENCOUNTER — Encounter

## 2024-09-17 ENCOUNTER — Ambulatory Visit: Admitting: Neurology

## 2024-09-17 ENCOUNTER — Ambulatory Visit (INDEPENDENT_AMBULATORY_CARE_PROVIDER_SITE_OTHER): Admitting: Neurology

## 2024-09-17 ENCOUNTER — Encounter: Payer: Self-pay | Admitting: Neurology

## 2024-09-17 VITALS — BP 131/55 | HR 78 | Ht 69.5 in | Wt 192.8 lb

## 2024-09-17 DIAGNOSIS — G40109 Localization-related (focal) (partial) symptomatic epilepsy and epileptic syndromes with simple partial seizures, not intractable, without status epilepticus: Secondary | ICD-10-CM

## 2024-09-17 DIAGNOSIS — R251 Tremor, unspecified: Secondary | ICD-10-CM

## 2024-09-17 NOTE — Patient Instructions (Signed)
 Good to see you!  Schedule EEG. If normal, we will plan for a 2-day home EEG  2. Continue Levetiracetam  (Keppra ) 500mg : take 1 and 1/2 tablets twice a day  3. Follow-up in 4 months, call for any changes   Seizure Precautions: 1. If medication has been prescribed for you to prevent seizures, take it exactly as directed.  Do not stop taking the medicine without talking to your doctor first, even if you have not had a seizure in a long time.   2. Avoid activities in which a seizure would cause danger to yourself or to others.  Don't operate dangerous machinery, swim alone, or climb in high or dangerous places, such as on ladders, roofs, or girders.  Do not drive unless your doctor says you may.  3. If you have any warning that you may have a seizure, lay down in a safe place where you can't hurt yourself.    4.  No driving for 6 months from last seizure, as per [place]  state law.   Please refer to the following link on the Epilepsy Foundation of America's website for more information: http://www.epilepsyfoundation.org/answerplace/Social/driving/drivingu.cfm   5.  Maintain good sleep hygiene.  6.  Contact your doctor if you have any problems that may be related to the medicine you are taking.  7.  Call 911 and bring the patient back to the ED if:        A.  The seizure lasts longer than 5 minutes.       B.  The patient doesn't awaken shortly after the seizure  C.  The patient has new problems such as difficulty seeing, speaking or moving  D.  The patient was injured during the seizure  E.  The patient has a temperature over 102 F (39C)  F.  The patient vomited and now is having trouble breathing

## 2024-09-17 NOTE — Progress Notes (Signed)
 "  NEUROLOGY FOLLOW UP OFFICE NOTE  Damon Parrish [id number] 1941-02-17  Discussed the use of AI scribe software for clinical note transcription with the patient, who gave verbal consent to proceed.  History of Present Illness I had the pleasure of seeing Damon Parrish in follow-up in the neurology clinic on 09/17/2024.  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 He is again accompanied by his wife who helps supplement the history today.  Records and images were personally reviewed where available. On his last visit, they were reporting bilateral hand tremor, R>L, mostly occurring when he is sitting in the kitchen or watching TV. Levetiracetam  was increased to 750mg  BID with no change in symptoms. He had a bad fall on 11/27 missing a step off his son's deck, sustaining a right hand fracture. It is unclear if he lost consciousness, possibly briefly, head CT showed a small focus of subdural hemorrhage along the left tentorium. Bilateral, right greater than left, subdural hygromas are stable.   His right hand is in a cast, his wife notes that when cast was first applied, the right hand shaking stopped. Recently they have been occurring daily, sometimes it stops when he changes the position of his hand, like placing it on his knee. The tremors are less severe in his left hand. It does not affect daily activities, such as eating, he keeps on going per wife. She shows a video where there is irregular flexion extension tremor of the right hand at the wrist that lasts for 20 seconds. He denies any headaches, dizziness, vision changes, focal numbness/tingling, no other falls. He does not drive.    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 Records from [facility] and recent hospitalization were reviewed and will be summarized as follows. In 12/2015, he presented for new onset seizures with left arm>leg rhythmic shaking lasting for an hour. MRI brain showed patchy acute infarct in the right frontoparietal MCA/ACA territory. EEG was normal. He was discharged home on Levetiracetam . He had a 30-day event monitor at that time which was normal. Since he was 6 months out from stroke and seizure, Levetiracetam  was weaned off in 2017 and EEG after was normal. In 12/2018, his wife reports he had an episode of involuntary left leg movements and weakness. Levetiracetam  500mg  BID was restarted. Over time, his wife reports that he had been taking it once a day. He was event-free for almost 4 years until 11/13/22 when he noticed he could not hold the fork in his left hand. His left leg also got weak. After he finished eating, he noticed involuntary left hand movements. He had a witnessed episode on 11/14/22, his wife reports his left hand clenched then fingers extended out. MRI brain showed acute right punctate MCA/ACA watershed infarcts, concerning for embolic source. CTA head and neck did not show any significant stenosis, there was mild atherosclerotic change at the right carotid bifurcation but no stenosis, right ICA stent in the distal siphon to supraclinoid ICA with no residual aneurysm flow. Overnight EEG no epileptiform discharges, there was intermittent right hemisphere slowing. Echocardiogram showed EF of 60-65%, HbA1c 6.1, LDL 81. He was discharged home on increased dose of Lipitor , DAPT for 3 weeks followed  by Plavix  alone, and Levetiracetam  500mg  BID. Exam on hospital discharge indicated mild left arm weakness with ataxia, tremor on right upper extremity (baseline). He was also discharged with a loop recorder due to concern for embolic source.   He and his wife deny any further  seizures since 11/14/22. He is tolerating LEV 500mg  BID without significant side effects. His wife denies any staring/unresponsive episodes. He reports he was fully aware during the seizures, no difficulty speaking or comprehending. He denies any olfactory/gustatory hallucinations, deja vu, rising epigastric sensation, focal numbness/tingling/weakness, myoclonic jerks. He denies any headaches, diplopia, dysarthria/dysphagia, neck/back pain, bowel/bladder dysfunction.His wife just recently noticed some twitching of his right upper arm when she held it. He has had a left hand tremor over the past year that does not affect daily activities. Memory is pretty good. His wife has been managing medications for years. He gets 7-9 hours of interrupted sleep, he may take naps during the day. He lives with his wife.   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 He had a normal birth and early development.  There is no history of febrile convulsions, CNS infections such as meningitis/encephalitis, significant traumatic brain injury, neurosurgical procedures, or family history of seizures.  Diagnostic Data: Brain MRI with and without contrast 11/2023 no acute changes. There was expected evolution of previously noted white matter infarct in the posterior right corona radiata, stable remote lacunar infarct in the posterior left corona radiata, lateral right thalamus. Chronic bilateral subdural hygromas, right greater than left, are slightly more prominent than on prior study, likely reflecting progressive atrophy.  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   GERD (gastroesophageal reflux disease)    Glaucoma 1980   H/O cerebral aneurysm repair 2016   put stent in   High cholesterol    History of blood transfusion ~ 1950   while in hospital w/pneumonia (01/09/2018)   History of kidney stones    Hypertension    Nonruptured cerebral aneurysm    Pneumonia ~ 1950   Red man syndrome    Seizures (HCC) 12/27/2015; 12/28/2015   Skin cancer 2014   Stroke (HCC) 12/2015 & 10/2022   denies residual on 01/09/2018)    MEDICATIONS: Medications Ordered Prior to Encounter[1]  ALLERGIES: Allergies[2]  FAMILY HISTORY: Family History  Problem Relation Age of Onset   Lymphoma Mother    Dementia Mother    Cancer Mother    Stroke Mother    Aneurysm Father    Colon cancer Neg Hx     SOCIAL HISTORY: Social History   Socioeconomic History   Marital status: Married    Spouse name: Not on file   Number of children: Not on file   Years of education: Not on file   Highest education level: 12th grade  Occupational History   Not on file  Tobacco Use   Smoking status: Every Day    Average packs/day: 1 pack/day for 61.0 years (61.0 ttl pk-yrs)    Types: Cigarettes    Start  date: 1958   Smokeless tobacco: Never   Tobacco comments:    1 pack a day 08/10/2024 KRd  Vaping Use   Vaping status: Never Used  Substance and Sexual Activity   Alcohol use: No   Drug use: No   Sexual activity: Not Currently  Other Topics Concern   Not on file  Social History Narrative   Are you right handed or left handed? Right handed    Are you currently employed ? Retired    What is your current occupation?   Do you live at home alone?no    Who lives with you? wife   What type of home do you live in: 1 story or 2 story?  1 story, ranch home 1 step        Social Drivers of Health   Tobacco Use: High Risk (09/09/2024)   Received from [facility]   Patient History    Smoking Tobacco Use: Every Day     Smokeless Tobacco Use: Never    Passive Exposure: Never  Financial Resource Strain: Low Risk (06/14/2024)   Overall Financial Resource Strain (CARDIA)    Difficulty of Paying Living Expenses: Not hard at all  Food Insecurity: No Food Insecurity (06/14/2024)   Epic    Worried About Radiation Protection Practitioner of Food in the Last Year: Never true    Ran Out of Food in the Last Year: Never true  Transportation Needs: No Transportation Needs (06/14/2024)   Epic    Lack of Transportation (Medical): No    Lack of Transportation (Non-Medical): No  Physical Activity: Inactive (06/14/2024)   Exercise Vital Sign    Days of Exercise per Week: 0 days    Minutes of Exercise per Session: Not on file  Stress: No Stress Concern Present (06/14/2024)   [employer] of Occupational Health - Occupational Stress Questionnaire    Feeling of Stress: Not at all  Social Connections: Socially Integrated (06/14/2024)   Social Connection and Isolation Panel    Frequency of Communication with Friends and Family: Three times a week    Frequency of Social Gatherings with Friends and Family: Twice a week    Attends Religious Services: More than 4 times per year    Active Member of [employer] or Organizations: Yes    Attends Banker Meetings: Patient declined    Marital Status: Married  Catering Manager Violence: Not At Risk (11/05/2023)   Humiliation, Afraid, Rape, and Kick questionnaire    Fear of Current or Ex-Partner: No    Emotionally Abused: No    Physically Abused: No    Sexually Abused: No  Depression (PHQ2-9): Low Risk (11/05/2023)   Depression (PHQ2-9)    PHQ-2 Score: 0  Alcohol Screen: Low Risk (06/14/2024)   Alcohol Screen    Last Alcohol Screening Score (AUDIT): 1  Housing: Low Risk (06/14/2024)   Epic    Unable to Pay for Housing in the Last Year: No    Number of Times Moved in the Last Year: 0    Homeless in the Last Year: No  Utilities: Not At Risk (11/05/2023)   A[facility] Utilities    Threatened with  loss of utilities: No  Health Literacy: Adequate Health Literacy (11/05/2023)   B1300 Health Literacy    Frequency of need for help with medical instructions: Never     PHYSICAL EXAM: Vitals:   09/17/24 1456  BP: (!) 131/55  Pulse: 78  SpO2: 97%   General: No acute distress Head:  Normocephalic/atraumatic  Skin/Extremities: No rash, no edema. Right wrist and hand 4th and 5th digits in cast Neurological Exam: alert and awake. No aphasia or dysarthria. Fund of knowledge is appropriate.  Attention and concentration are normal.   Cranial nerves: Pupils equal, round. Extraocular movements intact with no nystagmus. Visual fields full.  No facial asymmetry.  Motor: Bulk and tone normal, no cogwheeling on left, muscle strength 5/5 throughout with no pronator drift (right wrist and last 2 digits in cast). Finger to nose testing intact.  Gait slow and cautious with cane, favoring left leg (chronic leg injury). No ataxia. No tremor in office today.  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 No typical seizures since 10/2022. He continues to have the right hand tremors, MRI brain done for this did not show any underlying abnormality. The tremors are intermittent but occur daily, etiology unclear. We discussed doing an EEG, if normal, a 48-hour ambulatory EEG will be ordered. Continue Levetiracertam 750mg  BID. He does not drive. Follow-up in 4 months, call for any changes.    Thank you for allowing me to participate in his care.  Please do not hesitate to call for any questions or concerns.    Darice Shivers, M.D.   CC: Dr.  Chandra       [1]  Current Outpatient Medications on File Prior to Visit  Medication Sig Dispense Refill   albuterol  (VENTOLIN  HFA) 108 (90 Base) MCG/ACT inhaler Inhale 1-2 puffs into the lungs every 6 (six) hours as needed for wheezing or shortness of breath. 8 g 3   amLODipine  (NORVASC ) 5 MG tablet Take 1 tablet (5 mg total) by mouth daily. 90 tablet 3   Ascorbic Acid  (VITAMIN C ) 1000 MG tablet Take 1,000 mg by mouth daily.     atorvastatin  (LIPITOR ) 80 MG tablet Take 1 tablet (80 mg total) by mouth at bedtime. 90 tablet 3   bacitracin  ointment Apply 1 Application topically 2 (two) times daily. 120 g 0   Brinzolamide -Brimonidine  (SIMBRINZA) 1-0.2 % SUSP Place 1 drop into the right eye 2 (two) times daily.     budesonide-glycopyrrolate -formoterol (BREZTRI  AEROSPHERE) 160-9-4.8 MCG/ACT AERO inhaler Inhale 2 puffs into the lungs in the morning and at bedtime.     budesonide-glycopyrrolate -formoterol (BREZTRI  AEROSPHERE) 160-9-4.8 MCG/ACT AERO inhaler Inhale 2 puffs into the lungs in the morning and at bedtime.     calcium  carbonate (OSCAL) 1500 (600 Ca) MG TABS tablet Take 600 mg of elemental calcium  by mouth daily with breakfast.     Cholecalciferol  125 MCG (5000 UT) TABS Take 5,000 Units by mouth daily.     clopidogrel  (PLAVIX ) 75 MG tablet Take 1 tablet (75 mg total) by mouth daily. 90 tablet 3   doxycycline  (VIBRAMYCIN ) 100 MG capsule Take 1 capsule (100 mg total) by mouth 2 (two) times daily. 14 capsule 0   finasteride  (PROSCAR ) 5 MG tablet Take 1 tablet (5 mg total) by mouth daily. 90 tablet 3   fluticasone  (FLONASE ) 50 MCG/ACT nasal spray Place 2 sprays into both nostrils daily.     levETIRAcetam  (KEPPRA ) 500 MG tablet Take 1 and 1/2 tablets twice a day 270 tablet 3   Multiple Vitamin (MULTIVITAMIN) capsule Take 1 capsule by mouth  daily. 50+     multivitamin-lutein  (OCUVITE-LUTEIN ) CAPS capsule Take 1 capsule by mouth daily.     oxyCODONE -acetaminophen  (PERCOCET/ROXICET) 5-325 MG tablet  Take 1 tablet by mouth every 6 (six) hours as needed for severe pain (pain score 7-10). 15 tablet 0   prednisoLONE  acetate (PRED FORTE ) 1 % ophthalmic suspension Place 1 drop into the left eye in the morning.     predniSONE  (DELTASONE ) 10 MG tablet Prednisone  taper; 10 mg tablets: 4 tabs x 2 days, 3 tabs x 2 days, 2 tabs x 2 days 1 tab x 2 days then stop. 20 tablet 0   senna-docusate (SENOKOT-S) 8.6-50 MG tablet Take 2 tablets by mouth at bedtime as needed for mild constipation.     Spacer/Aero-Holding Chambers (AEROCHAMBER PLUS) Device Use with Breztri  inhaler and albuterol  inhaler 1 each 0   tamsulosin  (FLOMAX ) 0.4 MG CAPS capsule Take 1 capsule (0.4 mg total) by mouth daily after supper. 90 capsule 3   vitamin E  400 UNIT capsule Take 400 Units by mouth daily.     No current facility-administered medications on file prior to visit.  [2]  Allergies Allergen Reactions   Cephalosporins Rash    Full body rash with systemic symptoms   Vancomycin  Rash    Full body rash with systemic symptoms   "

## 2024-09-18 ENCOUNTER — Ambulatory Visit: Attending: Cardiovascular Disease

## 2024-09-18 DIAGNOSIS — I639 Cerebral infarction, unspecified: Secondary | ICD-10-CM

## 2024-09-20 LAB — CUP PACEART REMOTE DEVICE CHECK
Date Time Interrogation Session: 20251226231535
Implantable Pulse Generator Implant Date: 20240223

## 2024-09-21 ENCOUNTER — Ambulatory Visit: Payer: Self-pay

## 2024-09-21 DIAGNOSIS — R251 Tremor, unspecified: Secondary | ICD-10-CM

## 2024-09-21 DIAGNOSIS — G40109 Localization-related (focal) (partial) symptomatic epilepsy and epileptic syndromes with simple partial seizures, not intractable, without status epilepticus: Secondary | ICD-10-CM

## 2024-09-21 NOTE — Progress Notes (Unsigned)
 EEG complete and ready for review.

## 2024-09-22 ENCOUNTER — Ambulatory Visit: Payer: Self-pay | Admitting: Cardiovascular Disease

## 2024-09-22 NOTE — Procedures (Signed)
 ELECTROENCEPHALOGRAM REPORT  Date of Study: 09/21/2024  Patient's Name: Damon Parrish MRN: [id number] Date of Birth: 02-24-1941  Referring Provider: Dr. 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 EEG for classification.  CNS Active Medications: Keppra   Technical Summary: A multichannel digital EEG recording measured by the international 10-20 system with electrodes applied with paste and impedances below 5000 ohms performed in our laboratory with EKG monitoring in an awake and drowsy patient.  Hyperventilation was not performed. Photic stimulation was performed.  The digital EEG was referentially recorded, reformatted, and digitally filtered in a variety of bipolar and referential montages for optimal display.    Description: The patient is awake and drowsy during the recording.  During maximal wakefulness, there is a symmetric, medium voltage 9 Hz posterior dominant rhythm that attenuates with eye opening.  The record is symmetric.  During drowsiness, there is an increase in theta slowing of the background. Sleep was not captured. Photic stimulation did not elicit any abnormalities.  There were no epileptiform discharges or electrographic seizures seen.    EKG lead was unremarkable.  Impression: This awake and drowsy EEG is normal.    Clinical Correlation: A normal EEG does not exclude a clinical diagnosis of epilepsy.  If further clinical questions remain, prolonged EEG may be helpful.  Clinical correlation is advised.   Darice Shivers, M.D.

## 2024-09-27 NOTE — Progress Notes (Signed)
 Remote Loop Recorder Transmission

## 2024-09-30 ENCOUNTER — Encounter

## 2024-10-15 ENCOUNTER — Encounter

## 2024-10-19 ENCOUNTER — Ambulatory Visit: Payer: Self-pay | Admitting: Cardiovascular Disease

## 2024-10-19 ENCOUNTER — Ambulatory Visit: Attending: Cardiovascular Disease

## 2024-10-19 DIAGNOSIS — I639 Cerebral infarction, unspecified: Secondary | ICD-10-CM

## 2024-10-19 LAB — CUP PACEART REMOTE DEVICE CHECK
Date Time Interrogation Session: 20260126232526
Implantable Pulse Generator Implant Date: 20240223

## 2024-10-22 ENCOUNTER — Ambulatory Visit

## 2024-10-28 NOTE — Progress Notes (Signed)
 Remote Loop Recorder Transmission

## 2024-11-01 ENCOUNTER — Encounter

## 2024-11-09 ENCOUNTER — Ambulatory Visit: Payer: Medicare Other

## 2024-11-19 ENCOUNTER — Ambulatory Visit

## 2024-11-23 ENCOUNTER — Encounter: Admitting: Family Medicine

## 2024-12-20 ENCOUNTER — Ambulatory Visit

## 2025-01-03 ENCOUNTER — Ambulatory Visit (HOSPITAL_COMMUNITY)

## 2025-01-17 ENCOUNTER — Ambulatory Visit: Admitting: Radiation Oncology

## 2025-01-20 ENCOUNTER — Ambulatory Visit

## 2025-01-20 ENCOUNTER — Ambulatory Visit: Payer: Self-pay | Admitting: Neurology

## 2025-02-20 ENCOUNTER — Ambulatory Visit

## 2025-03-23 ENCOUNTER — Ambulatory Visit

## 2025-03-31 ENCOUNTER — Ambulatory Visit: Admitting: Neurology

## 2025-04-23 ENCOUNTER — Ambulatory Visit

## 2025-05-24 ENCOUNTER — Ambulatory Visit

## 2025-06-24 ENCOUNTER — Ambulatory Visit

## 2025-07-25 ENCOUNTER — Ambulatory Visit

## 2025-08-25 ENCOUNTER — Ambulatory Visit

## 2025-09-25 ENCOUNTER — Ambulatory Visit
